# Patient Record
Sex: Female | Born: 1970 | Race: Black or African American | Hispanic: No | Marital: Single | Smoking: Never smoker
Health system: Southern US, Community
[De-identification: ages and names within clinical notes are randomized; demographics above are authoritative.]

## PROBLEM LIST (undated history)

## (undated) ENCOUNTER — Telehealth: Attending: Physician Assistant | Primary: Physician Assistant

## (undated) ENCOUNTER — Ambulatory Visit

## (undated) ENCOUNTER — Encounter: Attending: Physician Assistant | Primary: Physician Assistant

## (undated) ENCOUNTER — Ambulatory Visit: Payer: MEDICARE | Attending: Neurology | Primary: Neurology

## (undated) ENCOUNTER — Encounter

## (undated) ENCOUNTER — Encounter: Attending: Neurology | Primary: Neurology

## (undated) ENCOUNTER — Telehealth

## (undated) ENCOUNTER — Ambulatory Visit: Attending: Pharmacist | Primary: Pharmacist

## (undated) ENCOUNTER — Institutional Professional Consult (permissible substitution): Payer: MEDICARE | Attending: Physician Assistant | Primary: Physician Assistant

## (undated) ENCOUNTER — Ambulatory Visit
Payer: MEDICARE | Attending: Rehabilitative and Restorative Service Providers" | Primary: Rehabilitative and Restorative Service Providers"

## (undated) ENCOUNTER — Encounter: Attending: Pharmacist | Primary: Pharmacist

## (undated) ENCOUNTER — Ambulatory Visit: Payer: MEDICARE

## (undated) ENCOUNTER — Ambulatory Visit
Attending: Pharmacist Clinician (PhC)/ Clinical Pharmacy Specialist | Primary: Pharmacist Clinician (PhC)/ Clinical Pharmacy Specialist

## (undated) ENCOUNTER — Ambulatory Visit: Payer: MEDICARE | Attending: Physician Assistant | Primary: Physician Assistant

## (undated) ENCOUNTER — Ambulatory Visit: Payer: BLUE CROSS/BLUE SHIELD | Attending: Physician Assistant | Primary: Physician Assistant

## (undated) ENCOUNTER — Ambulatory Visit
Payer: BLUE CROSS/BLUE SHIELD | Attending: Pharmacist Clinician (PhC)/ Clinical Pharmacy Specialist | Primary: Pharmacist Clinician (PhC)/ Clinical Pharmacy Specialist

## (undated) DIAGNOSIS — K5900 Constipation, unspecified: Secondary | ICD-10-CM

## (undated) DIAGNOSIS — K5730 Diverticulosis of large intestine without perforation or abscess without bleeding: Secondary | ICD-10-CM

## (undated) DIAGNOSIS — G35 Multiple sclerosis: Secondary | ICD-10-CM

## (undated) DIAGNOSIS — G43909 Migraine, unspecified, not intractable, without status migrainosus: Secondary | ICD-10-CM

## (undated) DIAGNOSIS — M62838 Other muscle spasm: Secondary | ICD-10-CM

## (undated) DIAGNOSIS — T7840XA Allergy, unspecified, initial encounter: Secondary | ICD-10-CM

## (undated) DIAGNOSIS — Z8489 Family history of other specified conditions: Secondary | ICD-10-CM

## (undated) DIAGNOSIS — G4700 Insomnia, unspecified: Secondary | ICD-10-CM

## (undated) DIAGNOSIS — I10 Essential (primary) hypertension: Secondary | ICD-10-CM

## (undated) HISTORY — DX: Migraine, unspecified, not intractable, without status migrainosus: G43.909

## (undated) HISTORY — DX: Insomnia, unspecified: G47.00

## (undated) HISTORY — DX: Other muscle spasm: M62.838

## (undated) HISTORY — DX: Constipation, unspecified: K59.00

## (undated) HISTORY — DX: Allergy, unspecified, initial encounter: T78.40XA

## (undated) HISTORY — PX: GANGLION CYST EXCISION: SHX1691

## (undated) HISTORY — PX: BUNIONECTOMY: SHX129

## (undated) MED ORDER — CHOLECALCIFEROL (VITAMIN D3) 100 MCG (4,000 UNIT) CAPSULE: Freq: Every day | ORAL | 0.00000 days

## (undated) MED ORDER — MULTIVITAMIN-CA-IRON-MINERALS TABLET: ORAL | 0 days

---
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
## 2006-06-08 ENCOUNTER — Ambulatory Visit: Payer: Self-pay | Admitting: Family Medicine

## 2011-01-07 ENCOUNTER — Emergency Department: Payer: Self-pay | Admitting: Internal Medicine

## 2011-01-09 ENCOUNTER — Ambulatory Visit: Payer: Self-pay | Admitting: Family Medicine

## 2012-06-21 DIAGNOSIS — Z8679 Personal history of other diseases of the circulatory system: Secondary | ICD-10-CM | POA: Insufficient documentation

## 2013-04-17 DIAGNOSIS — G35 Multiple sclerosis: Secondary | ICD-10-CM | POA: Insufficient documentation

## 2013-05-28 ENCOUNTER — Encounter: Payer: Self-pay | Admitting: Physician Assistant

## 2013-08-06 DIAGNOSIS — E559 Vitamin D deficiency, unspecified: Secondary | ICD-10-CM | POA: Insufficient documentation

## 2013-10-01 ENCOUNTER — Ambulatory Visit: Payer: Self-pay | Admitting: Gastroenterology

## 2013-10-26 ENCOUNTER — Emergency Department: Payer: Self-pay | Admitting: Emergency Medicine

## 2013-10-27 ENCOUNTER — Emergency Department: Payer: Self-pay | Admitting: Internal Medicine

## 2014-10-01 ENCOUNTER — Emergency Department: Payer: Self-pay | Admitting: Internal Medicine

## 2014-10-01 DIAGNOSIS — S199XXA Unspecified injury of neck, initial encounter: Secondary | ICD-10-CM | POA: Diagnosis not present

## 2014-10-01 DIAGNOSIS — S4992XA Unspecified injury of left shoulder and upper arm, initial encounter: Secondary | ICD-10-CM | POA: Diagnosis not present

## 2014-10-01 DIAGNOSIS — S40012A Contusion of left shoulder, initial encounter: Secondary | ICD-10-CM | POA: Diagnosis not present

## 2014-10-01 DIAGNOSIS — M25512 Pain in left shoulder: Secondary | ICD-10-CM | POA: Diagnosis not present

## 2014-10-01 DIAGNOSIS — M549 Dorsalgia, unspecified: Secondary | ICD-10-CM | POA: Diagnosis not present

## 2014-10-01 DIAGNOSIS — S161XXA Strain of muscle, fascia and tendon at neck level, initial encounter: Secondary | ICD-10-CM | POA: Diagnosis not present

## 2014-10-01 DIAGNOSIS — M542 Cervicalgia: Secondary | ICD-10-CM | POA: Diagnosis not present

## 2014-10-01 DIAGNOSIS — T1490 Injury, unspecified: Secondary | ICD-10-CM | POA: Diagnosis not present

## 2014-10-14 DIAGNOSIS — E041 Nontoxic single thyroid nodule: Secondary | ICD-10-CM | POA: Diagnosis not present

## 2014-10-14 DIAGNOSIS — D440 Neoplasm of uncertain behavior of thyroid gland: Secondary | ICD-10-CM | POA: Diagnosis not present

## 2014-11-04 DIAGNOSIS — K219 Gastro-esophageal reflux disease without esophagitis: Secondary | ICD-10-CM | POA: Diagnosis not present

## 2014-11-04 DIAGNOSIS — G35 Multiple sclerosis: Secondary | ICD-10-CM | POA: Diagnosis not present

## 2014-11-04 DIAGNOSIS — E785 Hyperlipidemia, unspecified: Secondary | ICD-10-CM | POA: Diagnosis not present

## 2014-11-04 DIAGNOSIS — F325 Major depressive disorder, single episode, in full remission: Secondary | ICD-10-CM | POA: Diagnosis not present

## 2014-11-04 DIAGNOSIS — G43009 Migraine without aura, not intractable, without status migrainosus: Secondary | ICD-10-CM | POA: Diagnosis not present

## 2014-11-04 DIAGNOSIS — E041 Nontoxic single thyroid nodule: Secondary | ICD-10-CM | POA: Diagnosis not present

## 2014-11-04 DIAGNOSIS — Z87828 Personal history of other (healed) physical injury and trauma: Secondary | ICD-10-CM | POA: Diagnosis not present

## 2014-11-04 DIAGNOSIS — F5104 Psychophysiologic insomnia: Secondary | ICD-10-CM | POA: Diagnosis not present

## 2014-11-06 DIAGNOSIS — E041 Nontoxic single thyroid nodule: Secondary | ICD-10-CM | POA: Diagnosis not present

## 2014-11-06 DIAGNOSIS — E042 Nontoxic multinodular goiter: Secondary | ICD-10-CM | POA: Diagnosis not present

## 2015-01-27 DIAGNOSIS — K6289 Other specified diseases of anus and rectum: Secondary | ICD-10-CM | POA: Diagnosis not present

## 2015-03-03 DIAGNOSIS — E559 Vitamin D deficiency, unspecified: Secondary | ICD-10-CM | POA: Diagnosis not present

## 2015-03-03 DIAGNOSIS — G35 Multiple sclerosis: Secondary | ICD-10-CM | POA: Diagnosis not present

## 2015-03-29 DIAGNOSIS — R937 Abnormal findings on diagnostic imaging of other parts of musculoskeletal system: Secondary | ICD-10-CM | POA: Diagnosis not present

## 2015-03-29 DIAGNOSIS — R930 Abnormal findings on diagnostic imaging of skull and head, not elsewhere classified: Secondary | ICD-10-CM | POA: Diagnosis not present

## 2015-03-29 DIAGNOSIS — M2578 Osteophyte, vertebrae: Secondary | ICD-10-CM | POA: Diagnosis not present

## 2015-03-29 DIAGNOSIS — G9389 Other specified disorders of brain: Secondary | ICD-10-CM | POA: Diagnosis not present

## 2015-03-29 DIAGNOSIS — M898X8 Other specified disorders of bone, other site: Secondary | ICD-10-CM | POA: Diagnosis not present

## 2015-03-29 DIAGNOSIS — G35 Multiple sclerosis: Secondary | ICD-10-CM | POA: Diagnosis not present

## 2015-04-02 DIAGNOSIS — M25572 Pain in left ankle and joints of left foot: Secondary | ICD-10-CM | POA: Diagnosis not present

## 2015-04-02 DIAGNOSIS — K5900 Constipation, unspecified: Secondary | ICD-10-CM | POA: Diagnosis not present

## 2015-04-02 DIAGNOSIS — D72819 Decreased white blood cell count, unspecified: Secondary | ICD-10-CM | POA: Diagnosis not present

## 2015-04-02 DIAGNOSIS — F5104 Psychophysiologic insomnia: Secondary | ICD-10-CM | POA: Diagnosis not present

## 2015-04-02 DIAGNOSIS — G35 Multiple sclerosis: Secondary | ICD-10-CM | POA: Diagnosis not present

## 2015-04-02 DIAGNOSIS — J302 Other seasonal allergic rhinitis: Secondary | ICD-10-CM | POA: Diagnosis not present

## 2015-04-02 DIAGNOSIS — Z79899 Other long term (current) drug therapy: Secondary | ICD-10-CM | POA: Diagnosis not present

## 2015-04-02 DIAGNOSIS — G43009 Migraine without aura, not intractable, without status migrainosus: Secondary | ICD-10-CM | POA: Diagnosis not present

## 2015-04-02 DIAGNOSIS — K6289 Other specified diseases of anus and rectum: Secondary | ICD-10-CM | POA: Diagnosis not present

## 2015-04-02 DIAGNOSIS — E559 Vitamin D deficiency, unspecified: Secondary | ICD-10-CM | POA: Diagnosis not present

## 2015-04-02 DIAGNOSIS — F325 Major depressive disorder, single episode, in full remission: Secondary | ICD-10-CM | POA: Diagnosis not present

## 2015-04-02 DIAGNOSIS — E785 Hyperlipidemia, unspecified: Secondary | ICD-10-CM | POA: Diagnosis not present

## 2015-05-20 ENCOUNTER — Telehealth: Payer: Self-pay | Admitting: Family Medicine

## 2015-05-20 ENCOUNTER — Other Ambulatory Visit: Payer: Self-pay

## 2015-05-20 NOTE — Telephone Encounter (Signed)
Last seen 04-02-15 states that miralax does not really work when she have her flare-ups and she is completely out of lizness. If possible please send lizness prescription to [facility]

## 2015-05-21 MED ORDER — LINACLOTIDE 145 MCG PO CAPS: 145.0000 ug | ORAL_CAPSULE | Freq: Every day | ORAL | Status: DC

## 2015-05-21 NOTE — Telephone Encounter (Signed)
Left message to inform her that the prescription has been called in and that she need to schedule an appointment.

## 2015-05-21 NOTE — Telephone Encounter (Signed)
Please call patient so she can schedule a follow up appointment . Sending a refill of Linzess today.  Thank you

## 2015-05-22 NOTE — Telephone Encounter (Signed)
Patient states she needs her Lincess to be sent to [facility].

## 2015-05-23 NOTE — Telephone Encounter (Signed)
[facility], so I called pt to verify that the pharmacy was correct. She said that is her pharmacy she uses. I called in the Rx for [facility].

## 2015-08-26 ENCOUNTER — Encounter: Payer: Self-pay | Admitting: Emergency Medicine

## 2015-08-26 ENCOUNTER — Ambulatory Visit
Admission: EM | Admit: 2015-08-26 | Discharge: 2015-08-26 | Disposition: A | Discharge status: Home / Self Care | Payer: Medicare Other | Attending: Family Medicine | Admitting: Family Medicine

## 2015-08-26 DIAGNOSIS — H9201 Otalgia, right ear: Secondary | ICD-10-CM

## 2015-08-26 DIAGNOSIS — J0100 Acute maxillary sinusitis, unspecified: Secondary | ICD-10-CM

## 2015-08-26 MED ORDER — AZITHROMYCIN 250 MG PO TABS: ORAL_TABLET | ORAL | Status: DC

## 2015-08-26 NOTE — ED Provider Notes (Signed)
[facility] Emergency Department Provider Note  ____________________________________________  Time seen: Approximately 7:26 PM  I have reviewed the triage vital signs and the nursing notes.   HISTORY  Chief Complaint Otalgia    HPI LAIKYN GEWIRTZ is a 44 y.o. female presents for complaints of right ear pain and sinus congestion. States she has had sinus congestion and sinus pressure x 2-3 weeks, unrelieved by OTC mucinex and pseudoephedrine. States last 2 days with right ear discomfort, states feels like pressure. Denies head injury or LOC. States frequently thick greenish nasal drainage.   Denies headache, chest pain, dizziness, weakness, shortness of breath, fever, vomiting, nausea, neck pain or other complaints. Reports continues to eat and drink well. States unrelieved by OTC medications.    History reviewed. No pertinent past medical history. Multiple Sclerosis Vertigo Hypertension Vitamin D deficiency   There are no active problems to display for this patient.   History reviewed. No pertinent past surgical history.  Current Outpatient Rx  Name  Route  Sig  Dispense  Refill  .           Marland Kitchen Linaclotide (LINZESS) 145 MCG CAPS capsule   Oral   Take 1 capsule (145 mcg total) by mouth daily.   30 capsule   1   copazone injections Vitamin D gabapentin  Allergies Penicillins  History reviewed. No pertinent family history.  Social History Social History  Substance Use Topics  . Smoking status: Never Smoker   . Smokeless tobacco: None  . Alcohol Use: No    Review of Systems Constitutional: No fever/chills Eyes: No visual changes. ENT: No sore throat. Positive for sinus congestion, nasal drainage, and right ear discomfort.  Cardiovascular: Denies chest pain. Respiratory: Denies shortness of breath. Gastrointestinal: No abdominal pain.  No nausea, no vomiting.  No diarrhea.  No constipation. Genitourinary: Negative for  dysuria. Musculoskeletal: Negative for back pain. Skin: Negative for rash. Neurological: Negative for headaches, focal weakness or numbness.  10-point ROS otherwise negative.  ____________________________________________   PHYSICAL EXAM:  VITAL SIGNS: ED Triage Vitals  Enc Vitals Group     BP 08/26/15 1856 139/91 mmHg     Pulse Rate 08/26/15 1856 81     Resp 08/26/15 1856 18     Temp 08/26/15 1856 98.8 F (37.1 C)     Temp Source 08/26/15 1856 Tympanic     SpO2 08/26/15 1856 100 %     Weight 08/26/15 1856 151 lb (68.493 kg)     Height 08/26/15 1856 5\' 3"  (1.6 m)     Head Cir --      Peak Flow --      Pain Score 08/26/15 1858 7     Pain Loc --      Pain Edu? --      Excl. in [place]? --     Constitutional: Alert and oriented. Well appearing and in no acute distress. Eyes: Conjunctivae are normal. PERRL. EOMI. Head: Atraumatic.mod TTP bilateral maxillary sinuses, frontal sinuses nonTTP. No erythema or swelling.   Ears: no erythema, normal TMs bilaterally.   Nose: purulent nasal drainage, bilateral nasal turbinate edema and bogginess, nares patent.   Mouth/Throat: Mucous membranes are moist.  Oropharynx non-erythematous. Neck: No stridor.  No cervical spine tenderness to palpation. Hematological/Lymphatic/Immunilogical: No cervical lymphadenopathy. Cardiovascular: Normal rate, regular rhythm. Grossly normal heart sounds.  Good peripheral circulation. Respiratory: Normal respiratory effort.  No retractions. Lungs CTAB. Gastrointestinal: Soft and nontender. No distention. Normal Bowel sounds. No CVA tenderness. Musculoskeletal:  No lower or upper extremity tenderness nor edema.  No joint effusions. Bilateral pedal pulses equal and easily palpated. No cervical, thoracic or lumbar TTP.  Neurologic:  Normal speech and language. No gross focal neurologic deficits are appreciated. No gait instability. Skin:  Skin is warm, dry and intact. No rash noted. Psychiatric: Mood and affect are  normal. Speech and behavior are normal.  ____________________________________________   LABS (all labs ordered are listed, but only abnormal results are displayed)  Labs Reviewed - No data to display ______________________________________   INITIAL IMPRESSION / ASSESSMENT AND PLAN / ED COURSE  Pertinent labs & imaging results that were available during my care of the patient were reviewed by me and considered in my medical decision making (see chart for details).  Well-appearing patient. No acute distress.Presents for sinus drainage and congestion x 2-3 weeks and with right ear discomfort x 2 days. Denies fever, chest pain, shortness of breath, dizziness, or other complaints. Continues to eat and drink well per patient. 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 Will treat with oral azithromycin and supportive treatments including rest, fluids, otc tylenol or ibuprofen as needed. Discussed follow up with Primary care physician this week. Discussed follow up and return parameters including no resolution or any worsening concerns. Patient verbalized understanding and agreed to plan.   ____________________________________________   FINAL CLINICAL IMPRESSION(S) / ED DIAGNOSES  Final diagnoses:  Acute maxillary sinusitis, recurrence not specified  Otalgia, right       Marylene Land, NP 08/26/15 2000  Marylene Land, NP 08/26/15 2007  Marylene Land, NP 08/26/15 2008

## 2015-08-26 NOTE — ED Notes (Signed)
Pt with right ear pain x 2 days

## 2015-08-26 NOTE — Discharge Instructions (Signed)
2 medication as prescribed. Rest. Drink plenty of water. Take over-the-counter Tylenol or ibuprofen as needed for pain.  Follow-up with her primary care physician. Return to urgent care as needed for new or worsening concerns.  Sinusitis Sinusitis is redness, soreness, and inflammation of the paranasal sinuses. Paranasal sinuses are air pockets within the bones of your face (beneath the eyes, the middle of the forehead, or above the eyes). In healthy paranasal sinuses, mucus is able to drain out, and air is able to circulate through them by way of your nose. However, when your paranasal sinuses are inflamed, mucus and air can become trapped. This can allow bacteria and other germs to grow and cause infection. Sinusitis can develop quickly and last only a short time (acute) or continue over a long period (chronic). Sinusitis that lasts for more than 12 weeks is considered chronic.  CAUSES  Causes of sinusitis include:  Allergies.  Structural abnormalities, such as displacement of the cartilage that separates your nostrils (deviated septum), which can decrease the air flow through your nose and sinuses and affect sinus drainage.  Functional abnormalities, such as when the small hairs (cilia) that line your sinuses and help remove mucus do not work properly or are not present. SIGNS AND SYMPTOMS  Symptoms of acute and chronic sinusitis are the same. The primary symptoms are pain and pressure around the affected sinuses. Other symptoms include:  Upper toothache.  Earache.  Headache.  Bad breath.  Decreased sense of smell and taste.  A cough, which worsens when you are lying flat.  Fatigue.  Fever.  Thick drainage from your nose, which often is green and may contain pus (purulent).  Swelling and warmth over the affected sinuses. DIAGNOSIS  Your health care provider will perform a physical exam. During the exam, your health care provider may:  Look in your nose for signs of abnormal  growths in your nostrils (nasal polyps).  Tap over the affected sinus to check for signs of infection.  View the inside of your sinuses (endoscopy) using an imaging device that has a light attached (endoscope). If your health care provider suspects that you have chronic sinusitis, one or more of the following tests may be recommended:  Allergy tests.  Nasal culture. A sample of mucus is taken from your nose, sent to a lab, and screened for bacteria.  Nasal cytology. A sample of mucus is taken from your nose and examined by your health care provider to determine if your sinusitis is related to an allergy. TREATMENT  Most cases of acute sinusitis are related to a viral infection and will resolve on their own within 10 days. Sometimes medicines are prescribed to help relieve symptoms (pain medicine, decongestants, nasal steroid sprays, or saline sprays).  However, for sinusitis related to a bacterial infection, your health care provider will prescribe antibiotic medicines. These are medicines that will help kill the bacteria causing the infection.  Rarely, sinusitis is caused by a fungal infection. In theses cases, your health care provider will prescribe antifungal medicine. For some cases of chronic sinusitis, surgery is needed. Generally, these are cases in which sinusitis recurs more than 3 times per year, despite other treatments. HOME CARE INSTRUCTIONS   Drink plenty of water. Water helps thin the mucus so your sinuses can drain more easily.  Use a humidifier.  Inhale steam 3 to 4 times a day (for example, sit in the bathroom with the shower running).  Apply a warm, moist washcloth to your face 3  to 4 times a day, or as directed by your health care provider.  Use saline nasal sprays to help moisten and clean your sinuses.  Take medicines only as directed by your health care provider.  If you were prescribed either an antibiotic or antifungal medicine, finish it all even if you start  to feel better. SEEK IMMEDIATE MEDICAL CARE IF:  You have increasing pain or severe headaches.  You have nausea, vomiting, or drowsiness.  You have swelling around your face.  You have vision problems.  You have a stiff neck.  You have difficulty breathing. MAKE SURE YOU:   Understand these instructions.  Will watch your condition.  Will get help right away if you are not doing well or get worse. Document Released: 11/15/2005 Document Revised: 04/01/2014 Document Reviewed: 11/30/2011 [facility] Patient Information 2015 [place], [place]. This information is not intended to replace advice given to you by your health care provider. Make sure you discuss any questions you have with your health care provider.  Otalgia Otalgia is pain in or around the ear. When the pain is from the ear itself it is called primary otalgia. Pain may also be coming from somewhere else, like the head and neck. This is called secondary otalgia.  CAUSES  Causes of primary otalgia include:  Middle ear infection.  It can also be caused by injury to the ear or infection of the ear canal (swimmer's ear). Swimmer's ear causes pain, swelling and often drainage from the ear canal. Causes of secondary otalgia include:  Sinus infections.  Allergies and colds that cause stuffiness of the nose and tubes that drain the ears (eustachian tubes).  Dental problems like cavities, gum infections or teething.  Sore Throat (tonsillitis and pharyngitis).  Swollen glands in the neck.  Infection of the bone behind the ear (mastoiditis).  TMJ discomfort (problems with the joint between your jaw and your skull).  Other problems such as nerve disorders, circulation problems, heart disease and tumors of the head and neck can also cause symptoms of ear pain. This is rare. DIAGNOSIS  Evaluation, Diagnosis and Testing:  Examination by your medical caregiver is recommended to evaluate and diagnose the cause of  otalgia.  Further testing or referral to a specialist may be indicated if the cause of the ear pain is not found and the symptom persists. TREATMENT   Your doctor may prescribe antibiotics if an ear infection is diagnosed.  Pain relievers and topical analgesics may be recommended.  It is important to take all medications as prescribed. HOME CARE INSTRUCTIONS   It may be helpful to sleep with the painful ear in the up position.  A warm compress over the painful ear may provide relief.  A soft diet and avoiding gum may help while ear pain is present. SEEK IMMEDIATE MEDICAL CARE IF:  You develop severe pain, a high fever, repeated vomiting or dehydration.  You develop extreme dizziness, headache, confusion, ringing in the ears (tinnitus) or hearing loss. Document Released: 12/23/2004 Document Revised: 02/07/2012 Document Reviewed: 09/24/2009 [facility] Patient Information 2015 [place], [place]. This information is not intended to replace advice given to you by your health care provider. Make sure you discuss any questions you have with your health care provider.

## 2015-12-31 DIAGNOSIS — Z5321 Procedure and treatment not carried out due to patient leaving prior to being seen by health care provider: Secondary | ICD-10-CM | POA: Diagnosis not present

## 2015-12-31 DIAGNOSIS — E559 Vitamin D deficiency, unspecified: Secondary | ICD-10-CM | POA: Diagnosis not present

## 2015-12-31 DIAGNOSIS — R0781 Pleurodynia: Secondary | ICD-10-CM | POA: Diagnosis not present

## 2015-12-31 DIAGNOSIS — G35 Multiple sclerosis: Secondary | ICD-10-CM | POA: Diagnosis not present

## 2016-01-02 ENCOUNTER — Telehealth: Payer: Self-pay | Admitting: Family Medicine

## 2016-01-02 NOTE — Telephone Encounter (Signed)
Pt needs refill on Linzess to be sent to [facility]. Pt is completely out and has an appt on 01/07/16. Pt would like to know if enough can be called into last until her appt. Please advise.

## 2016-01-03 MED ORDER — LINACLOTIDE 145 MCG PO CAPS
145.0000 ug | ORAL_CAPSULE | Freq: Every day | ORAL | Status: DC
Start: 2016-01-03 — End: 2016-12-09

## 2016-01-03 NOTE — Telephone Encounter (Signed)
sent 

## 2016-01-07 ENCOUNTER — Ambulatory Visit: Payer: Self-pay | Admitting: Family Medicine

## 2016-01-13 ENCOUNTER — Ambulatory Visit
Admission: EM | Admit: 2016-01-13 | Discharge: 2016-01-13 | Disposition: A | Discharge status: Home / Self Care | Payer: Medicare Other | Attending: Family Medicine | Admitting: Family Medicine

## 2016-01-13 ENCOUNTER — Encounter: Payer: Self-pay | Admitting: *Deleted

## 2016-01-13 DIAGNOSIS — Z880 Allergy status to penicillin: Secondary | ICD-10-CM | POA: Diagnosis not present

## 2016-01-13 DIAGNOSIS — J101 Influenza due to other identified influenza virus with other respiratory manifestations: Secondary | ICD-10-CM

## 2016-01-13 DIAGNOSIS — R0981 Nasal congestion: Secondary | ICD-10-CM | POA: Diagnosis present

## 2016-01-13 DIAGNOSIS — J09X2 Influenza due to identified novel influenza A virus with other respiratory manifestations: Secondary | ICD-10-CM | POA: Diagnosis not present

## 2016-01-13 DIAGNOSIS — J029 Acute pharyngitis, unspecified: Secondary | ICD-10-CM | POA: Diagnosis present

## 2016-01-13 LAB — RAPID INFLUENZA A&B ANTIGENS
Influenza A (ARMC): DETECTED
Influenza B (ARMC): NOT DETECTED

## 2016-01-13 MED ORDER — OSELTAMIVIR PHOSPHATE 75 MG PO CAPS
75.0000 mg | ORAL_CAPSULE | Freq: Two times a day (BID) | ORAL | Status: DC
Start: 2016-01-13 — End: 2016-12-09

## 2016-01-13 MED ORDER — GUAIFENESIN-CODEINE 100-10 MG/5ML PO SOLN: 10.0000 mL | Freq: Four times a day (QID) | ORAL | Status: DC | PRN

## 2016-01-13 MED ORDER — IBUPROFEN 800 MG PO TABS
800.0000 mg | ORAL_TABLET | Freq: Once | ORAL | Status: AC
  Administered 2016-01-13: 800 mg via ORAL

## 2016-01-13 NOTE — ED Provider Notes (Signed)
CSN: [id number]     Arrival date & time 01/13/16  1146 History   First MD Initiated Contact with Patient 01/13/16 1349     Chief Complaint  Patient presents with  . Cough  . Fever  . Sore Throat  . Nasal Congestion   (Consider location/radiation/quality/duration/timing/severity/associated sxs/prior Treatment) Patient is a 45 y.o. female presenting with URI.  URI Presenting symptoms: congestion, cough, fatigue, fever, rhinorrhea and sore throat   Severity:  Moderate Onset quality:  Sudden Duration:  2 days Timing:  Constant Progression:  Worsening Chronicity:  New Relieved by:  Nothing Ineffective treatments:  OTC medications Associated symptoms: arthralgias, myalgias and swollen glands   Associated symptoms: no headaches, no neck pain, no sinus pain and no wheezing   Risk factors: sick contacts (family members with flu)   Risk factors: not elderly, no chronic cardiac disease, no chronic respiratory disease, no diabetes mellitus and no immunosuppression     History reviewed. No pertinent past medical history. Past Surgical History  Procedure Laterality Date  . Cesarean section N/A    History reviewed. No pertinent family history. Social History  Substance Use Topics  . Smoking status: Never Smoker   . Smokeless tobacco: None  . Alcohol Use: No   OB History    No data available     Review of Systems  Constitutional: Positive for fever and fatigue.  HENT: Positive for congestion, rhinorrhea and sore throat.   Respiratory: Positive for cough. Negative for wheezing.   Musculoskeletal: Positive for myalgias and arthralgias. Negative for neck pain.  Neurological: Negative for headaches.    Allergies  Penicillins  Home Medications   Prior to Admission medications   Medication Sig Start Date End Date Taking? Authorizing Provider  azithromycin (ZITHROMAX Z-PAK) 250 MG tablet Take 2 tablets (500 mg) on  Day 1,  followed by 1 tablet (250 mg) once daily on Days 2 through  5. 08/26/15   Marylene Land, NP  guaiFENesin-codeine 100-10 MG/5ML syrup Take 10 mLs by mouth every 6 (six) hours as needed for cough. 01/13/16   Norval Gable, MD  Linaclotide [facility]) 145 MCG CAPS capsule Take 1 capsule (145 mcg total) by mouth daily. 01/03/16   Steele Sizer, MD  oseltamivir (TAMIFLU) 75 MG capsule Take 1 capsule (75 mg total) by mouth 2 (two) times daily. 01/13/16   Norval Gable, MD   Meds Ordered and Administered this Visit   Medications  ibuprofen (ADVIL,MOTRIN) tablet 800 mg (800 mg Oral Given 01/13/16 1320)    BP 144/94 mmHg  Pulse 92  Temp(Src) 99.6 F (37.6 C) (Oral)  Resp 20  Ht 5\' 2"  (1.575 m)  Wt 147 lb (66.679 kg)  BMI 26.88 kg/m2  SpO2 99%  LMP 12/17/2015 (Approximate) No data found.   Physical Exam  Constitutional: She appears well-developed and well-nourished. No distress.  HENT:  Head: Normocephalic and atraumatic.  Right Ear: Tympanic membrane, external ear and ear canal normal.  Left Ear: Tympanic membrane, external ear and ear canal normal.  Nose: Rhinorrhea present. No nose lacerations, sinus tenderness, nasal deformity, septal deviation or nasal septal hematoma. No epistaxis.  No foreign bodies.  Mouth/Throat: Uvula is midline and mucous membranes are normal. Posterior oropharyngeal erythema present. No oropharyngeal exudate or tonsillar abscesses.  Eyes: Conjunctivae and EOM are normal. Pupils are equal, round, and reactive to light. Right eye exhibits no discharge. Left eye exhibits no discharge. No scleral icterus.  Neck: Normal range of motion. Neck supple. No thyromegaly present.  Cardiovascular: Normal  rate, regular rhythm and normal heart sounds.   Pulmonary/Chest: Effort normal and breath sounds normal. No respiratory distress. She has no wheezes. She has no rales.  Lymphadenopathy:    She has no cervical adenopathy.  Skin: Rash noted. She is not diaphoretic.  Nursing note and vitals reviewed.   ED Course  Procedures (including  critical care time)  Labs Review Labs Reviewed  RAPID INFLUENZA A&B ANTIGENS ([place])    Imaging Review No results found.   Visual Acuity Review  Right Eye Distance:   Left Eye Distance:   Bilateral Distance:    Right Eye Near:   Left Eye Near:    Bilateral Near:         MDM   1. Influenza A    Discharge Medication List as of 01/13/2016  2:05 PM    START taking these medications   Details  guaiFENesin-codeine 100-10 MG/5ML syrup Take 10 mLs by mouth every 6 (six) hours as needed for cough., Starting 01/13/2016, Until Discontinued, Print    oseltamivir (TAMIFLU) 75 MG capsule Take 1 capsule (75 mg total) by mouth 2 (two) times daily., Starting 01/13/2016, Until Discontinued, Normal       1. Lab results and diagnosis reviewed with patient 2. rx as per orders above; reviewed possible side effects, interactions, risks and benefits  3. Recommend supportive treatment with otc analgesics, increased fluids 4. Follow-up prn if symptoms worsen or don't improve    Norval Gable, MD 01/13/16 1435

## 2016-01-13 NOTE — ED Notes (Signed)
Also c/o headache 

## 2016-01-13 NOTE — ED Notes (Signed)
Non-productive cough, fever, sore throat, and nasal congestion x2 days. Several family members dx with flu recently.

## 2016-05-25 ENCOUNTER — Encounter: Payer: Self-pay | Admitting: *Deleted

## 2016-05-25 ENCOUNTER — Ambulatory Visit
Admission: EM | Admit: 2016-05-25 | Discharge: 2016-05-25 | Disposition: A | Discharge status: Home / Self Care | Payer: Medicare Other | Attending: Family Medicine | Admitting: Family Medicine

## 2016-05-25 DIAGNOSIS — J0100 Acute maxillary sinusitis, unspecified: Secondary | ICD-10-CM | POA: Diagnosis not present

## 2016-05-25 HISTORY — DX: Diverticulosis of large intestine without perforation or abscess without bleeding: K57.30

## 2016-05-25 HISTORY — DX: Multiple sclerosis: G35

## 2016-05-25 MED ORDER — FLUTICASONE PROPIONATE 50 MCG/ACT NA SUSP: 2.0000 | Freq: Every day | NASAL | Status: DC

## 2016-05-25 MED ORDER — FEXOFENADINE-PSEUDOEPHED ER 180-240 MG PO TB24: 1.0000 | ORAL_TABLET | Freq: Every day | ORAL | Status: DC

## 2016-05-25 MED ORDER — CEFUROXIME AXETIL 500 MG PO TABS: 500.0000 mg | ORAL_TABLET | Freq: Two times a day (BID) | ORAL | Status: DC

## 2016-05-25 NOTE — ED Provider Notes (Signed)
CSN: [id number]     Arrival date & time 05/25/16  [id number] History   First MD Initiated Contact with Patient 05/25/16 [phone]    Nurses notes were reviewed. Chief Complaint  Patient presents with  . Sinus Problem    Patient reports sinus pain states that URI like symptoms about 6 days ago nasal congestion and coughing. Things were at least shifted to sinus pressure and nasal drainage becoming yellow and ask the same blood coming from. She reports unable to breathe through her nostril coughing and just feeling miserable. No fever. She does have a history of MS C-section and ganglion surgery before. She does not smoke and mother has diabetes and hypertension.    (Consider location/radiation/quality/duration/timing/severity/associated sxs/prior Treatment) Patient is a 45 y.o. female presenting with sinus complaint. The history is provided by the patient. No language interpreter was used.  Sinus Problem This is a new problem. The current episode started more than 2 days ago. The problem occurs constantly. The problem has been gradually worsening. Pertinent negatives include no chest pain, no abdominal pain, no headaches and no shortness of breath. Nothing aggravates the symptoms. Nothing relieves the symptoms. Treatments tried: decongestants.    Past Medical History  Diagnosis Date  . MS (multiple sclerosis) ([place])   . Diverticula of colon    Past Surgical History  Procedure Laterality Date  . Cesarean section N/A    Family History  Problem Relation Age of Onset  . Diabetes Mother   . Hypertension Mother   . Cancer Father    Social History  Substance Use Topics  . Smoking status: Never Smoker   . Smokeless tobacco: Never Used  . Alcohol Use: No   OB History    No data available     Review of Systems  HENT: Positive for nosebleeds, postnasal drip, rhinorrhea, sore throat and voice change.   Respiratory: Negative for shortness of breath.   Cardiovascular: Negative for chest pain.   Gastrointestinal: Negative for abdominal pain.  Neurological: Negative for headaches.  All other systems reviewed and are negative.   Allergies  Penicillins  Home Medications   Prior to Admission medications   Medication Sig Start Date End Date Taking? Authorizing Provider  Glatiramer Acetate (COPAXONE) 40 MG/ML SOSY Inject 40 mg into the skin 3 (three) times a week.   Yes Historical Provider, MD  azithromycin (ZITHROMAX Z-PAK) 250 MG tablet Take 2 tablets (500 mg) on  Day 1,  followed by 1 tablet (250 mg) once daily on Days 2 through 5. 08/26/15   Marylene Land, NP  cefUROXime (CEFTIN) 500 MG tablet Take 1 tablet (500 mg total) by mouth 2 (two) times daily. 05/25/16   Frederich Cha, MD  fexofenadine-pseudoephedrine (ALLEGRA-D ALLERGY & CONGESTION) 180-240 MG 24 hr tablet Take 1 tablet by mouth daily. 05/25/16   Frederich Cha, MD  fluticasone (FLONASE) 50 MCG/ACT nasal spray Place 2 sprays into both nostrils daily. 05/25/16   Frederich Cha, MD  guaiFENesin-codeine 100-10 MG/5ML syrup Take 10 mLs by mouth every 6 (six) hours as needed for cough. 01/13/16   Norval Gable, MD  Linaclotide [facility]) 145 MCG CAPS capsule Take 1 capsule (145 mcg total) by mouth daily. 01/03/16   Steele Sizer, MD  oseltamivir (TAMIFLU) 75 MG capsule Take 1 capsule (75 mg total) by mouth 2 (two) times daily. 01/13/16   Norval Gable, MD   Meds Ordered and Administered this Visit  Medications - No data to display  BP 152/99 mmHg  Pulse 81  Temp(Src)  98.2 F (36.8 C) (Oral)  Resp 18  Ht 5\' 2"  (1.575 m)  Wt 150 lb (68.04 kg)  BMI 27.43 kg/m2  SpO2 100%  LMP 05/20/2016 No data found.   Physical Exam  Constitutional: She is oriented to person, place, and time. She appears well-developed and well-nourished.  HENT:  Head: Normocephalic.  Right Ear: Hearing, tympanic membrane, external ear and ear canal normal.  Left Ear: Hearing, tympanic membrane, external ear and ear canal normal.  Nose: Mucosal edema and  rhinorrhea present. Right sinus exhibits maxillary sinus tenderness. Right sinus exhibits no frontal sinus tenderness. Left sinus exhibits no maxillary sinus tenderness and no frontal sinus tenderness.  Mouth/Throat: Uvula is midline. Posterior oropharyngeal erythema present.  Eyes: Pupils are equal, round, and reactive to light.  Neck: Normal range of motion. Neck supple.  Cardiovascular: Normal rate and regular rhythm.   Pulmonary/Chest: Breath sounds normal.  Musculoskeletal: Normal range of motion.  Lymphadenopathy:    She has cervical adenopathy.  Neurological: She is alert and oriented to person, place, and time.  Skin: Skin is warm.  Psychiatric: She has a normal mood and affect.  Vitals reviewed.   ED Course  Procedures (including critical care time)  Labs Review Labs Reviewed - No data to display  Imaging Review No results found.   Visual Acuity Review  Right Eye Distance:   Left Eye Distance:   Bilateral Distance:    Right Eye Near:   Left Eye Near:    Bilateral Near:         MDM   1. Acute maxillary sinusitis, recurrence not specified      We'll treat for sinusitis since been 5 days but because of the dramatic change and the epistaxis is occurring we'll go with Ceftin 500 mg twice a day she does have an allergy to penicillin but she is taken cephalosporins before in the past. Flonase nasal spray 2 puffs each nostril daily. And also placed on Allegra-D 1 tablet daily. Will give a work note for today and tomorrow follow-up with her PCP if not better in a week.      Frederich Cha, MD 05/25/16 1007

## 2016-05-25 NOTE — ED Notes (Signed)
Patient started having symptoms of sinus pressure and drainage 5 days ago. OTC medications have not helped to resolve symptoms. No previous history of recurrent sinus problems.

## 2016-05-25 NOTE — Discharge Instructions (Signed)
Sinusitis, Adult Sinusitis is redness, soreness, and puffiness (inflammation) of the air pockets in the bones of your face (sinuses). The redness, soreness, and puffiness can cause air and mucus to get trapped in your sinuses. This can allow germs to grow and cause an infection.  HOME CARE   Drink enough fluids to keep your pee (urine) clear or pale yellow.  Use a humidifier in your home.  Run a hot shower to create steam in the bathroom. Sit in the bathroom with the door closed. Breathe in the steam 3-4 times a day.  Put a warm, moist washcloth on your face 3-4 times a day, or as told by your doctor.  Use salt water sprays (saline sprays) to wet the thick fluid in your nose. This can help the sinuses drain.  Only take medicine as told by your doctor. GET HELP RIGHT AWAY IF:   Your pain gets worse.  You have very bad headaches.  You are sick to your stomach (nauseous).  You throw up (vomit).  You are very sleepy (drowsy) all the time.  Your face is puffy (swollen).  Your vision changes.  You have a stiff neck.  You have trouble breathing. MAKE SURE YOU:   Understand these instructions.  Will watch your condition.  Will get help right away if you are not doing well or get worse.   This information is not intended to replace advice given to you by your health care provider. Make sure you discuss any questions you have with your health care provider.   Document Released: 05/03/2008 Document Revised: 12/06/2014 Document Reviewed: 06/20/2012 Elsevier Interactive Patient Education 2016 [employer].  Sinusitis, Adult Sinusitis is redness, soreness, and puffiness (inflammation) of the air pockets in the bones of your face (sinuses). The redness, soreness, and puffiness can cause air and mucus to get trapped in your sinuses. This can allow germs to grow and cause an infection.  HOME CARE   Drink enough fluids to keep your pee (urine) clear or pale yellow.  Use a  humidifier in your home.  Run a hot shower to create steam in the bathroom. Sit in the bathroom with the door closed. Breathe in the steam 3-4 times a day.  Put a warm, moist washcloth on your face 3-4 times a day, or as told by your doctor.  Use salt water sprays (saline sprays) to wet the thick fluid in your nose. This can help the sinuses drain.  Only take medicine as told by your doctor. GET HELP RIGHT AWAY IF:   Your pain gets worse.  You have very bad headaches.  You are sick to your stomach (nauseous).  You throw up (vomit).  You are very sleepy (drowsy) all the time.  Your face is puffy (swollen).  Your vision changes.  You have a stiff neck.  You have trouble breathing. MAKE SURE YOU:   Understand these instructions.  Will watch your condition.  Will get help right away if you are not doing well or get worse.   This information is not intended to replace advice given to you by your health care provider. Make sure you discuss any questions you have with your health care provider.   Document Released: 05/03/2008 Document Revised: 12/06/2014 Document Reviewed: 06/20/2012 Elsevier Interactive Patient Education [employer].

## 2016-08-14 DIAGNOSIS — M4802 Spinal stenosis, cervical region: Secondary | ICD-10-CM | POA: Diagnosis not present

## 2016-08-14 DIAGNOSIS — R9089 Other abnormal findings on diagnostic imaging of central nervous system: Secondary | ICD-10-CM | POA: Diagnosis not present

## 2016-08-14 DIAGNOSIS — M5021 Other cervical disc displacement,  high cervical region: Secondary | ICD-10-CM | POA: Diagnosis not present

## 2016-08-14 DIAGNOSIS — G35 Multiple sclerosis: Secondary | ICD-10-CM | POA: Diagnosis not present

## 2016-08-27 DIAGNOSIS — G35 Multiple sclerosis: Secondary | ICD-10-CM | POA: Diagnosis not present

## 2016-12-09 ENCOUNTER — Ambulatory Visit (INDEPENDENT_AMBULATORY_CARE_PROVIDER_SITE_OTHER): Payer: Medicare Other | Admitting: Family Medicine

## 2016-12-09 ENCOUNTER — Encounter: Payer: Self-pay | Admitting: Family Medicine

## 2016-12-09 VITALS — BP 124/82 | HR 95 | Temp 98.3°F | Resp 16 | Ht 62.0 in | Wt 161.0 lb

## 2016-12-09 DIAGNOSIS — E663 Overweight: Secondary | ICD-10-CM | POA: Diagnosis not present

## 2016-12-09 DIAGNOSIS — E785 Hyperlipidemia, unspecified: Secondary | ICD-10-CM | POA: Insufficient documentation

## 2016-12-09 DIAGNOSIS — Z79899 Other long term (current) drug therapy: Secondary | ICD-10-CM | POA: Diagnosis not present

## 2016-12-09 DIAGNOSIS — M545 Low back pain, unspecified: Secondary | ICD-10-CM

## 2016-12-09 DIAGNOSIS — D708 Other neutropenia: Secondary | ICD-10-CM | POA: Diagnosis not present

## 2016-12-09 DIAGNOSIS — E559 Vitamin D deficiency, unspecified: Secondary | ICD-10-CM

## 2016-12-09 DIAGNOSIS — K5790 Diverticulosis of intestine, part unspecified, without perforation or abscess without bleeding: Secondary | ICD-10-CM | POA: Insufficient documentation

## 2016-12-09 DIAGNOSIS — K5909 Other constipation: Secondary | ICD-10-CM

## 2016-12-09 DIAGNOSIS — F325 Major depressive disorder, single episode, in full remission: Secondary | ICD-10-CM | POA: Insufficient documentation

## 2016-12-09 DIAGNOSIS — K6289 Other specified diseases of anus and rectum: Secondary | ICD-10-CM | POA: Insufficient documentation

## 2016-12-09 DIAGNOSIS — E669 Obesity, unspecified: Secondary | ICD-10-CM | POA: Insufficient documentation

## 2016-12-09 DIAGNOSIS — Z9109 Other allergy status, other than to drugs and biological substances: Secondary | ICD-10-CM | POA: Insufficient documentation

## 2016-12-09 DIAGNOSIS — G43009 Migraine without aura, not intractable, without status migrainosus: Secondary | ICD-10-CM

## 2016-12-09 DIAGNOSIS — J302 Other seasonal allergic rhinitis: Secondary | ICD-10-CM | POA: Diagnosis not present

## 2016-12-09 DIAGNOSIS — K219 Gastro-esophageal reflux disease without esophagitis: Secondary | ICD-10-CM | POA: Insufficient documentation

## 2016-12-09 MED ORDER — LINACLOTIDE 145 MCG PO CAPS: 145.0000 ug | ORAL_CAPSULE | Freq: Every day | ORAL | 5 refills | Status: DC

## 2016-12-09 MED ORDER — LORCASERIN HCL ER 20 MG PO TB24: 1.0000 | ORAL_TABLET | Freq: Every day | ORAL | 2 refills | Status: DC

## 2016-12-09 MED ORDER — SUMATRIPTAN SUCCINATE 100 MG PO TABS: 100.0000 mg | ORAL_TABLET | ORAL | 0 refills | Status: DC | PRN

## 2016-12-09 MED ORDER — FLUTICASONE PROPIONATE 50 MCG/ACT NA SUSP: 2.0000 | Freq: Every day | NASAL | 2 refills | Status: DC

## 2016-12-09 NOTE — Patient Instructions (Signed)

## 2016-12-09 NOTE — Progress Notes (Signed)
Name: Tammie Lopez   MRN: [id number]    DOB: 09/16/1971   Date:12/09/2016       Progress Note  Subjective  Chief Complaint  Chief Complaint  Patient presents with  . Medication Refill  . Pain    lower right side radiating into leg for about 3-4 months comes and goes  . 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 Left side used to go numb, but has been controlled with Copaxone, gabapentin and Elavil. She sees neurologist at [facility] - Dr. Varney Daily  Migraine Headaches: she has a long history of migraines, she states symptoms of migraine is down to once a week, she has been out of Imitrex, she would like a refill. No aura. Pain is described as frontal and behind her eyes, described as sharp, throbbing, associated with photophobia and phonophobia. Occasionally nausea, no vomiting  Overweight: upset about her weight gain, she has stopped drinking sodas, and has been walking for the past few months and states she has lost about 5 lbs on her own, but would like help to lose it faster. Explained risk of all the medications and based on guidelines she does not qualify, she states she will pay cash. We will give a rx of [place]vi[place] since it will not raise her bp and should not affect her central nervous system  Chronic Constipation: she has been out of Linzess and has bowel movements every 3-4 days, she has to strain at times, no blood in stools. Seen by GI in the past for proctitis and was given Canasa but is out of medication, advised to contact Dr Durwin Reges and keep her follow ups with him   Low back pain: she has intermittent right lower aching pain, that occasionally radiates to right lateral thigh, no tingling or numbness at this time.   Patient Active Problem List   Diagnosis Date Noted  . Migraine without aura, not intractable 12/09/2016  . Overweight (BMI 25.0-29.9) 12/09/2016  . Nickel allergy  12/09/2016  . GERD without esophagitis 12/09/2016  . Leukopenia 12/09/2016  . Dyslipidemia 12/09/2016  . Diverticulosis 12/09/2016  . 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 Major depression in remission ([place]) 12/09/2016  . Allergic rhinitis, seasonal 12/09/2016  . Chronic constipation 12/09/2016  . Vitamin D deficiency disease 08/06/2013  . MS (multiple sclerosis) ([place]) 04/17/2013  . History of hypertension 06/21/2012    Past Surgical History:  Procedure Laterality Date  . CESAREAN SECTION N/A     Family History  Problem Relation Age of Onset  . Diabetes Mother   . Hypertension Mother   . Cancer Father     Social History   Social History  . Marital status: Single    Spouse name: N/A  . Number of children: N/A  . Years of education: N/A   Occupational History  . Not on file.   Social History Main Topics  . Smoking status: Never Smoker  . Smokeless tobacco: Never Used  . Alcohol use No  . Drug use: No  . Sexual activity: Not on file   Other Topics Concern  . Not on file   Social History Narrative  . No narrative on file     Current Outpatient Prescriptions:  .  amitriptyline (ELAVIL) 25 MG tablet, Take 1 tablet by mouth at bedtime as needed., Disp: , Rfl:  .  gabapentin (NEURONTIN)  300 MG capsule, Take 600 mg by mouth 3 (three) times daily., Disp: , Rfl:  .  modafinil (PROVIGIL) 100 MG tablet, Take 1 tablet by mouth daily., Disp: , Rfl:  .  fexofenadine-pseudoephedrine (ALLEGRA-D ALLERGY & CONGESTION) 180-240 MG 24 hr tablet, Take 1 tablet by mouth daily., Disp: 30 tablet, Rfl: 0 .  fluticasone (FLONASE) 50 MCG/ACT nasal spray, Place 2 sprays into both nostrils daily., Disp: 16 g, Rfl: 2 .  Glatiramer Acetate (COPAXONE) 40 MG/ML SOSY, Inject 40 mg into the skin 3 (three) times a week., Disp: , Rfl:  .  linaclotide (LINZESS) 145 MCG CAPS capsule, Take 1 capsule (145 mcg total) by mouth daily., Disp: 30 capsule, Rfl: 5 .  SUMAtriptan (IMITREX) 100 MG tablet, Take 1 tablet  (100 mg total) by mouth every 2 (two) hours as needed for migraine. May repeat in 2 hours if headache persists or recurs., Disp: 10 tablet, Rfl: 0  Allergies  Allergen Reactions  . Penicillins Hives     ROS  Constitutional: Negative for fever, positive for weight change - gain  Respiratory: Negative for cough and shortness of breath.   Cardiovascular: Negative for chest pain or palpitations.  Gastrointestinal: Negative for abdominal pain, no bowel changes ( still has constipation ).  Musculoskeletal: Negative for gait problem or joint swelling.  Skin: Negative for rash.  Neurological: Negative for dizziness, positive for intermittent  headache.  No other specific complaints in a complete review of systems (except as listed in HPI above).  Objective  Vitals:   12/09/16 1032  BP: 124/82  Pulse: 95  Resp: 16  Temp: 98.3 F (36.8 C)  SpO2: 98%  Weight: 161 lb (73 kg)  Height: 5\' 2"  (1.575 m)    Body mass index is 29.45 kg/m.  Physical Exam  Constitutional: Patient appears well-developed and well-nourished. Overweight. No distress.  HEENT: head atraumatic, normocephalic, pupils equal and reactive to light, neck supple, throat within normal limits Cardiovascular: Normal rate, regular rhythm and normal heart sounds.  No murmur heard. No BLE edema. Pulmonary/Chest: Effort normal and breath sounds normal. No respiratory distress. Abdominal: Soft.  There is no tenderness. Psychiatric: Patient has a normal mood and affect. behavior is normal. Judgment and thought content normal. Muscular Skeletal: pain during palpation of right lower back, negative straight leg raise    Assessment & Plan  1. Migraine without aura and without status migrainosus, not intractable  - SUMAtriptan (IMITREX) 100 MG tablet; Take 1 tablet (100 mg total) by mouth every 2 (two) hours as needed for migraine. May repeat in 2 hours if headache persists or recurs.  Dispense: 10 tablet; Refill: 0  2.  Vitamin D deficiency disease  - VITAMIN D 25 Hydroxy (Vit-D Deficiency, Fractures)  3. Overweight (BMI 25.0-29.9)  She wants weight loss medication, discussed options, safest one for her in [place] - she states she will pay cash for it -Belviq XR 20 mg daily #30 and 2 refills - Insulin, fasting  4. Other neutropenia (HCC)  - CBC with Differential/Platelet  5. Dyslipidemia  Not fasting we will hold off on labs for now  6. Long-term use of high-risk medication  - COMPLETE METABOLIC PANEL WITH GFR  7. Chronic constipation  - linaclotide (LINZESS) 145 MCG CAPS capsule; Take 1 capsule (145 mcg total) by mouth daily.  Dispense: 30 capsule; Refill: 5  8. Seasonal allergic rhinitis, unspecified chronicity, unspecified trigger  - fluticasone (FLONASE) 50 MCG/ACT nasal spray; Place 2 sprays into both nostrils daily.  Dispense:  16 g; Refill: 2   9. Intermittent low back pain  Advised home exercises or PT

## 2017-02-21 ENCOUNTER — Encounter: Payer: Medicare Other | Admitting: Family Medicine

## 2017-04-15 DIAGNOSIS — J988 Other specified respiratory disorders: Secondary | ICD-10-CM | POA: Diagnosis not present

## 2017-05-24 ENCOUNTER — Encounter: Payer: Self-pay | Admitting: Family Medicine

## 2017-05-24 ENCOUNTER — Ambulatory Visit (INDEPENDENT_AMBULATORY_CARE_PROVIDER_SITE_OTHER): Payer: Medicare Other | Admitting: Family Medicine

## 2017-05-24 ENCOUNTER — Other Ambulatory Visit: Payer: Self-pay | Admitting: Family Medicine

## 2017-05-24 VITALS — BP 128/82 | HR 94 | Temp 98.4°F | Resp 16 | Ht 62.0 in | Wt 164.2 lb

## 2017-05-24 DIAGNOSIS — R5383 Other fatigue: Secondary | ICD-10-CM

## 2017-05-24 DIAGNOSIS — E663 Overweight: Secondary | ICD-10-CM

## 2017-05-24 DIAGNOSIS — F331 Major depressive disorder, recurrent, moderate: Secondary | ICD-10-CM | POA: Diagnosis not present

## 2017-05-24 DIAGNOSIS — Z79899 Other long term (current) drug therapy: Secondary | ICD-10-CM | POA: Diagnosis not present

## 2017-05-24 DIAGNOSIS — E785 Hyperlipidemia, unspecified: Secondary | ICD-10-CM | POA: Diagnosis not present

## 2017-05-24 DIAGNOSIS — Z0000 Encounter for general adult medical examination without abnormal findings: Secondary | ICD-10-CM

## 2017-05-24 DIAGNOSIS — Z124 Encounter for screening for malignant neoplasm of cervix: Secondary | ICD-10-CM

## 2017-05-24 DIAGNOSIS — K5909 Other constipation: Secondary | ICD-10-CM

## 2017-05-24 DIAGNOSIS — Z114 Encounter for screening for human immunodeficiency virus [HIV]: Secondary | ICD-10-CM

## 2017-05-24 DIAGNOSIS — E559 Vitamin D deficiency, unspecified: Secondary | ICD-10-CM

## 2017-05-24 DIAGNOSIS — D708 Other neutropenia: Secondary | ICD-10-CM | POA: Diagnosis not present

## 2017-05-24 DIAGNOSIS — Z1231 Encounter for screening mammogram for malignant neoplasm of breast: Secondary | ICD-10-CM

## 2017-05-24 DIAGNOSIS — F339 Major depressive disorder, recurrent, unspecified: Secondary | ICD-10-CM | POA: Insufficient documentation

## 2017-05-24 NOTE — Patient Instructions (Signed)
Preventive Care 40-64 Years, Female Preventive care refers to lifestyle choices and visits with your health care provider that can promote health and wellness. What does preventive care include?  A yearly physical exam. This is also called an annual well check.  Dental exams once or twice a year.  Routine eye exams. Ask your health care provider how often you should have your eyes checked.  Personal lifestyle choices, including: ? Daily care of your teeth and gums. ? Regular physical activity. ? Eating a healthy diet. ? Avoiding tobacco and drug use. ? Limiting alcohol use. ? Practicing safe sex. ? Taking low-dose aspirin daily starting at age 45. ? Taking vitamin and mineral supplements as recommended by your health care provider. What happens during an annual well check? The services and screenings done by your health care provider during your annual well check will depend on your age, overall health, lifestyle risk factors, and family history of disease. Counseling Your health care provider may ask you questions about your:  Alcohol use.  Tobacco use.  Drug use.  Emotional well-being.  Home and relationship well-being.  Sexual activity.  Eating habits.  Work and work Statistician.  Method of birth control.  Menstrual cycle.  Pregnancy history.  Screening You may have the following tests or measurements:  Height, weight, and BMI.  Blood pressure.  Lipid and cholesterol levels. These may be checked every 5 years, or more frequently if you are over 45 years old.  Skin check.  Lung cancer screening. You may have this screening every year starting at age 45 if you have a 30-pack-year history of smoking and currently smoke or have quit within the past 15 years.  Fecal occult blood test (FOBT) of the stool. You may have this test every year starting at age 45.  Flexible sigmoidoscopy or colonoscopy. You may have a sigmoidoscopy every 5 years or a colonoscopy  every 10 years starting at age 45.  Hepatitis C blood test.  Hepatitis B blood test.  Sexually transmitted disease (STD) testing.  Diabetes screening. This is done by checking your blood sugar (glucose) after you have not eaten for a while (fasting). You may have this done every 1-3 years.  Mammogram. This may be done every 1-2 years. Talk to your health care provider about when you should start having regular mammograms. This may depend on whether you have a family history of breast cancer.  BRCA-related cancer screening. This may be done if you have a family history of breast, ovarian, tubal, or peritoneal cancers.  Pelvic exam and Pap test. This may be done every 3 years starting at age 45. Starting at age 45, this may be done every 5 years if you have a Pap test in combination with an HPV test.  Bone density scan. This is done to screen for osteoporosis. You may have this scan if you are at high risk for osteoporosis.  Discuss your test results, treatment options, and if necessary, the need for more tests with your health care provider. Vaccines Your health care provider may recommend certain vaccines, such as:  Influenza vaccine. This is recommended every year.  Tetanus, diphtheria, and acellular pertussis (Tdap, Td) vaccine. You may need a Td booster every 10 years.  Varicella vaccine. You may need this if you have not been vaccinated.  Zoster vaccine. You may need this after age 5.  Measles, mumps, and rubella (MMR) vaccine. You may need at least one dose of MMR if you were born in  1956 or later. You may also need a second dose.  Pneumococcal 13-valent conjugate (PCV13) vaccine. You may need this if you have certain conditions and were not previously vaccinated.  Pneumococcal polysaccharide (PPSV23) vaccine. You may need one or two doses if you smoke cigarettes or if you have certain conditions.  Meningococcal vaccine. You may need this if you have certain  conditions.  Hepatitis A vaccine. You may need this if you have certain conditions or if you travel or work in places where you may be exposed to hepatitis A.  Hepatitis B vaccine. You may need this if you have certain conditions or if you travel or work in places where you may be exposed to hepatitis B.  Haemophilus influenzae type b (Hib) vaccine. You may need this if you have certain conditions.  Talk to your health care provider about which screenings and vaccines you need and how often you need them. This information is not intended to replace advice given to you by your health care provider. Make sure you discuss any questions you have with your health care provider. Document Released: 12/12/2015 Document Revised: 08/04/2016 Document Reviewed: 09/16/2015 Elsevier Interactive Patient Education  2017 [employer].

## 2017-05-24 NOTE — Progress Notes (Signed)
Name: Tammie Lopez   MRN: [id number]    DOB: 16-Apr-1971   Date:05/24/2017       Progress Note  Subjective  Chief Complaint  Chief Complaint  Patient presents with  . Annual Exam    HPI  Functional ability/safety issues: she gets tired fast secondary to MS and needs to take breaks.  Hearing issues: Addressed  Activities of daily living: Discussed Home safety issues: No Issues  End Of Life Planning: Offered verbal information regarding advanced directives, healthcare power of attorney.  Preventative care, Health maintenance, Preventative health measures discussed.  Preventative screenings discussed today: lab work, colonoscopy,  mammogram, DEXA.  Low Dose CT Chest recommended if Age 64-80 years, 30 pack-year currently smoking OR have quit w/in 15years.   Lifestyle risk factor issued reviewed: Diet, exercise, weight management, advised patient smoking is not healthy, nutrition/diet.  Preventative health measures discussed (5-10 year plan).  Reviewed and recommended vaccinations: - Pneumovax  - Prevnar  - Annual Influenza - Zostavax - Tdap   Depression screening: Done Fall risk screening: Done Discuss ADLs/IADLs: Done  Current medical providers: See HPI  Other health risk factors identified this visit: No other issues Cognitive impairment issues: None identified  All above discussed with patient. Appropriate education, counseling and referral will be made based upon the above.   Overweight: very frustrated about weight, could not afford weight loss medication - Belviq, explained that we can check for insulin resistance. She needs to return for blood work.   MS: continue follow up with neurologist, she has intermittent numbness and tingling on left leg and occasional weakness  Major depression: she states her weight makes her feel hopeless, she really would like to take something to help her lose weight. She denies suicidal thoughts or ideation.   Chronic  constipation: she takes Linzess prn. She denies pain with bowel movements or blood in stools. She has to strain if she does not take Linzess.  Dyslipidemia: she is due for labs.    Patient Active Problem List   Diagnosis Date Noted  . Major depression, recurrent ([place]) 05/24/2017  . Migraine without aura, not intractable 12/09/2016  . Overweight (BMI 25.0-29.9) 12/09/2016  . Nickel allergy 12/09/2016  . GERD without esophagitis 12/09/2016  . Leukopenia 12/09/2016  . Dyslipidemia 12/09/2016  . Diverticulosis 12/09/2016  . 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 Major depression in remission ([place]) 12/09/2016  . Allergic rhinitis, seasonal 12/09/2016  . Chronic constipation 12/09/2016  . Vitamin D deficiency disease 08/06/2013  . MS (multiple sclerosis) ([place]) 04/17/2013  . History of hypertension 06/21/2012    Past Surgical History:  Procedure Laterality Date  . BUNIONECTOMY    . CESAREAN SECTION N/A 2006  . GANGLION CYST EXCISION Bilateral    Hands    Family History  Problem Relation Age of Onset  . Diabetes Mother   . Hypertension Mother   . Cancer Father 75       Mouth  . COPD Father   . Hypertension Brother   . Diabetes Brother     Social History   Social History  . Marital status: Single    Spouse name: N/A  . Number of children: N/A  . Years of education: N/A   Occupational History  . Not on file.   Social History Main Topics  . Smoking status: Never Smoker  . Smokeless tobacco: Never Used  . Alcohol use No  . Drug use: No  . Sexual activity: Not Currently    Partners: Male  Other Topics Concern  . 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  amitriptyline (ELAVIL) 25 MG tablet, Take 1 tablet by mouth at bedtime as needed., Disp: , Rfl:  .  fexofenadine-pseudoephedrine (ALLEGRA-D ALLERGY & CONGESTION) 180-240 MG 24 hr  tablet, Take 1 tablet by mouth daily., Disp: 30 tablet, Rfl: 0 .  fluticasone (FLONASE) 50 MCG/ACT nasal spray, Place 2 sprays into both nostrils daily., Disp: 16 g, Rfl: 2 .  gabapentin (NEURONTIN) 300 MG capsule, Take 600 mg by mouth 3 (three) times daily., Disp: , Rfl:  .  Glatiramer Acetate (COPAXONE) 40 MG/ML SOSY, Inject 40 mg into the skin 3 (three) times a week., Disp: , Rfl:  .  linaclotide (LINZESS) 145 MCG CAPS capsule, Take 1 capsule (145 mcg total) by mouth daily., Disp: 30 capsule, Rfl: 5 .  modafinil (PROVIGIL) 100 MG tablet, Take 1 tablet by mouth daily., Disp: , Rfl:  .  SUMAtriptan (IMITREX) 100 MG tablet, Take 1 tablet (100 mg total) by mouth every 2 (two) hours as needed for migraine. May repeat in 2 hours if headache persists or recurs., Disp: 10 tablet, Rfl: 0  Allergies  Allergen Reactions  . Penicillins Hives     ROS  Constitutional: Negative for fever or weight change.  Respiratory: Negative for cough and shortness of breath.   Cardiovascular: Negative for chest pain or palpitations.  Gastrointestinal: Negative for abdominal pain, no bowel changes.  Musculoskeletal: Negative for gait problem or joint swelling.  Skin: Negative for rash.  Neurological: Negative for dizziness , positive for intermittent headache.  No other specific complaints in a complete review of systems (except as listed in HPI above).  Objective  Vitals:   05/24/17 1123  BP: 128/82  Pulse: 94  Resp: 16  Temp: 98.4 F (36.9 C)  TempSrc: Oral  SpO2: 98%  Weight: 164 lb 3.2 oz (74.5 kg)  Height: 5\' 2"  (1.575 m)    Body mass index is 30.03 kg/m.  Physical Exam  Constitutional: Patient appears well-developed and overweight. No distress.  HENT: Head: Normocephalic and atraumatic. Ears: B TMs ok, no erythema or effusion; Nose: Nose normal. Mouth/Throat: Oropharynx is clear and moist. No oropharyngeal exudate.  Eyes: Conjunctivae and EOM are normal. Pupils are equal, round, and  reactive to light. No scleral icterus.  Neck: Normal range of motion. Neck supple. No JVD present. No thyromegaly present.  Cardiovascular: Normal rate, regular rhythm and normal heart sounds.  No murmur heard. No BLE edema. Pulmonary/Chest: Effort normal and breath sounds normal. No respiratory distress. Abdominal: Soft. Bowel sounds are normal, no distension. There is no tenderness. no masses Breast: no lumps or masses, no nipple discharge or rashes FEMALE GENITALIA:  External genitalia normal External urethra normal Vaginal vault normal without discharge or lesions Cervix normal without discharge or lesions Bimanual exam normal without masses RECTAL: not done Musculoskeletal: Normal range of motion, no joint effusions. No gross deformities Neurological: he is alert and oriented to person, place, and time. No cranial nerve deficit. Coordination, balance, strength, speech and gait are normal.  Skin: Skin is warm and dry. No rash noted. No erythema.  Psychiatric: Patient has a normal mood and affect. behavior is normal. Judgment and thought content normal.  PHQ2/9: Depression screen PHQ 2/9 05/24/2017  Decreased Interest 1  Down, Depressed, Hopeless 1  PHQ - 2 Score 2  Altered sleeping 3  Tired, decreased energy 3  Change in appetite 3  Feeling bad or failure about yourself  1  Trouble concentrating 0  Moving slowly or fidgety/restless 1  Suicidal thoughts 0  PHQ-9 Score 13  Difficult doing work/chores Not difficult at all     Fall Risk: Fall Risk  05/24/2017  Falls in the past year? No    Functional Status Survey: Is the patient deaf or have difficulty hearing?: No Does the patient have difficulty seeing, even when wearing glasses/contacts?: No Does the patient have difficulty concentrating, remembering, or making decisions?: No Does the patient have difficulty walking or climbing stairs?: No Does the patient have difficulty dressing or bathing?: No Does the patient have  difficulty doing errands alone such as visiting a doctor's office or shopping?: No  Current Exercise Habits: Home exercise routine, Type of exercise: walking, Time (Minutes): 45, Frequency (Times/Week): 3, Weekly Exercise (Minutes/Week): 135, Intensity: Moderate    Assessment & Plan  1. Medicare annual wellness visit, subsequent  Discussed importance of 150 minutes of physical activity weekly, eat two servings of fish weekly, eat one serving of tree nuts ( cashews, pistachios, pecans, almonds.Marland Kitchen) every other day, eat 6 servings of fruit/vegetables daily and drink plenty of water and avoid sweet beverages.    2. Dyslipidemia  - Lipid panel  3. Other neutropenia (HCC)  -CBC  4. Vitamin D deficiency disease  - VITAMIN D 25 Hydroxy (Vit-D Deficiency, Fractures)  5. Overweight (BMI 25.0-29.9)  - Insulin, fasting  6. Encounter for screening for cervical cancer   - Pap IG w/ reflex to HPV when ASC-U  7. Encounter for screening mammogram for breast cancer  - MM Digital Screening; Future  8. Long-term use of high-risk medication  - COMPLETE METABOLIC PANEL WITH GFR - CBC with Differential/Platelet - Hemoglobin A1c  9. Encounter for screening for HIV  - HIV antibody - TSH  10. Other fatigue  - COMPLETE METABOLIC PANEL WITH GFR - CBC with Differential/Platelet - VITAMIN D 25 Hydroxy (Vit-D Deficiency, Fractures) - TSH  11. Chronic constipation  Continue Linzess  12. Moderate episode of recurrent major depressive disorder ([place])  Refuses medication or therapy

## 2017-05-25 ENCOUNTER — Other Ambulatory Visit: Payer: Self-pay | Admitting: Family Medicine

## 2017-05-25 ENCOUNTER — Telehealth: Payer: Self-pay | Admitting: Family Medicine

## 2017-05-25 DIAGNOSIS — E559 Vitamin D deficiency, unspecified: Secondary | ICD-10-CM | POA: Diagnosis not present

## 2017-05-25 DIAGNOSIS — E663 Overweight: Secondary | ICD-10-CM | POA: Diagnosis not present

## 2017-05-25 DIAGNOSIS — R5383 Other fatigue: Secondary | ICD-10-CM | POA: Diagnosis not present

## 2017-05-25 DIAGNOSIS — Z114 Encounter for screening for human immunodeficiency virus [HIV]: Secondary | ICD-10-CM | POA: Diagnosis not present

## 2017-05-25 DIAGNOSIS — E785 Hyperlipidemia, unspecified: Secondary | ICD-10-CM | POA: Diagnosis not present

## 2017-05-25 DIAGNOSIS — Z79899 Other long term (current) drug therapy: Secondary | ICD-10-CM | POA: Diagnosis not present

## 2017-05-25 LAB — CBC WITH DIFFERENTIAL/PLATELET
Basophils Absolute: 0 cells/uL (ref 0–200)
Basophils Relative: 0 %
Eosinophils Absolute: 124 cells/uL (ref 15–500)
Eosinophils Relative: 4 %
HCT: 38.8 % (ref 35.0–45.0)
Hemoglobin: 12.7 g/dL (ref 11.7–15.5)
Lymphocytes Relative: 48 %
Lymphs Abs: 1488 cells/uL (ref 850–3900)
MCH: 27.4 pg (ref 27.0–33.0)
MCHC: 32.7 g/dL (ref 32.0–36.0)
MCV: 83.6 fL (ref 80.0–100.0)
MPV: 10.5 fL (ref 7.5–12.5)
Monocytes Absolute: 310 cells/uL (ref 200–950)
Monocytes Relative: 10 %
Neutro Abs: 1178 cells/uL — ABNORMAL LOW (ref 1500–7800)
Neutrophils Relative %: 38 %
Platelets: 248 10*3/uL (ref 140–400)
RBC: 4.64 MIL/uL (ref 3.80–5.10)
RDW: 13.2 % (ref 11.0–15.0)
WBC: 3.1 10*3/uL — ABNORMAL LOW (ref 3.8–10.8)

## 2017-05-25 LAB — TSH: TSH: 0.96 mIU/L

## 2017-05-25 NOTE — Telephone Encounter (Signed)
Pt was seen yesterday and had mentioned to you about a referral to podiatry for the bunion on right foot. However, pt states that she has not received the referral yet. Would like to know if she need to make another appt or are you able to make the referral from yesterday visit. [phone]

## 2017-05-25 NOTE — Telephone Encounter (Signed)
Not on the chart, it must be documented for referral to be placed

## 2017-05-26 LAB — LIPID PANEL
Cholesterol: 182 mg/dL (ref <200)
HDL: 61 mg/dL (ref >50)
LDL Cholesterol: 110 mg/dL — ABNORMAL HIGH (ref <100)
Total CHOL/HDL Ratio: 3 Ratio (ref <5.0)
Triglycerides: 57 mg/dL (ref <150)
VLDL: 11 mg/dL (ref <30)

## 2017-05-26 LAB — VITAMIN D 25 HYDROXY (VIT D DEFICIENCY, FRACTURES): Vit D, 25-Hydroxy: 11 ng/mL — ABNORMAL LOW (ref 30–100)

## 2017-05-26 LAB — COMPLETE METABOLIC PANEL WITH GFR
ALT: 7 U/L (ref 6–29)
AST: 10 U/L (ref 10–35)
Albumin: 4.2 g/dL (ref 3.6–5.1)
Alkaline Phosphatase: 30 U/L — ABNORMAL LOW (ref 33–115)
BUN: 9 mg/dL (ref 7–25)
CO2: 23 mmol/L (ref 20–31)
Calcium: 9 mg/dL (ref 8.6–10.2)
Chloride: 105 mmol/L (ref 98–110)
Creat: 0.61 mg/dL (ref 0.50–1.10)
GFR, Est African American: >89 mL/min (ref >=60)
GFR, Est Non African American: >89 mL/min (ref >=60)
Glucose, Bld: 93 mg/dL (ref 65–99)
Potassium: 4.1 mmol/L (ref 3.5–5.3)
Sodium: 141 mmol/L (ref 135–146)
Total Bilirubin: 0.5 mg/dL (ref 0.2–1.2)
Total Protein: 6.9 g/dL (ref 6.1–8.1)

## 2017-05-26 LAB — PAP IG W/ RFLX HPV ASCU

## 2017-05-26 LAB — HEMOGLOBIN A1C
Hgb A1c MFr Bld: 5.4 % (ref <5.7)
Mean Plasma Glucose: 108 mg/dL

## 2017-05-26 LAB — HIV ANTIBODY (ROUTINE TESTING W REFLEX): HIV 1&2 Ab, 4th Generation: NONREACTIVE

## 2017-05-26 LAB — INSULIN, FASTING: Insulin fasting, serum: 6.7 u[IU]/mL (ref 2.0–19.6)

## 2017-05-26 NOTE — Telephone Encounter (Signed)
Please schedule patient an appointment for problem. Thanks

## 2017-05-27 ENCOUNTER — Other Ambulatory Visit: Payer: Self-pay | Admitting: Family Medicine

## 2017-05-27 MED ORDER — VITAMIN D (ERGOCALCIFEROL) 1.25 MG (50000 UNIT) PO CAPS: 50000.0000 [IU] | ORAL_CAPSULE | ORAL | 0 refills | Status: DC

## 2017-05-27 NOTE — Telephone Encounter (Signed)
Pt called checking status on referral. Pt informed that she must be seen. Appt made for Monday 05-30-17

## 2017-05-30 ENCOUNTER — Encounter: Payer: Self-pay | Admitting: Family Medicine

## 2017-05-30 ENCOUNTER — Ambulatory Visit (INDEPENDENT_AMBULATORY_CARE_PROVIDER_SITE_OTHER): Payer: Medicare Other | Admitting: Family Medicine

## 2017-05-30 VITALS — BP 138/86 | HR 81 | Temp 98.0°F | Resp 16 | Ht 62.0 in | Wt 162.4 lb

## 2017-05-30 DIAGNOSIS — E8881 Metabolic syndrome: Secondary | ICD-10-CM

## 2017-05-30 DIAGNOSIS — E669 Obesity, unspecified: Secondary | ICD-10-CM

## 2017-05-30 DIAGNOSIS — G43009 Migraine without aura, not intractable, without status migrainosus: Secondary | ICD-10-CM | POA: Diagnosis not present

## 2017-05-30 DIAGNOSIS — M21611 Bunion of right foot: Secondary | ICD-10-CM

## 2017-05-30 DIAGNOSIS — E559 Vitamin D deficiency, unspecified: Secondary | ICD-10-CM

## 2017-05-30 DIAGNOSIS — H40153 Residual stage of open-angle glaucoma, bilateral: Secondary | ICD-10-CM | POA: Diagnosis not present

## 2017-05-30 DIAGNOSIS — J069 Acute upper respiratory infection, unspecified: Secondary | ICD-10-CM

## 2017-05-30 MED ORDER — DULAGLUTIDE 1.5 MG/0.5ML ~~LOC~~ SOAJ: 1.5000 mg | SUBCUTANEOUS | 2 refills | Status: DC

## 2017-05-30 MED ORDER — SUMATRIPTAN SUCCINATE 100 MG PO TABS: 100.0000 mg | ORAL_TABLET | ORAL | 1 refills | Status: DC | PRN

## 2017-05-30 MED ORDER — BENZONATATE 100 MG PO CAPS: 100.0000 mg | ORAL_CAPSULE | Freq: Two times a day (BID) | ORAL | 0 refills | Status: DC | PRN

## 2017-05-30 NOTE — Progress Notes (Signed)
Name: Tammie Lopez   MRN: [id number]    DOB: Jan 14, 1971   Date:05/30/2017       Progress Note  Subjective  Chief Complaint  Chief Complaint  Patient presents with  . Migraine    Needs refill of Imitrex, has had a migraine since this weekend and is getting better but has ran out of her medication.  . Referral    Needs referral for removal of her right foot bunionectomy.     HPI  Bunion; she had bunionectomy left side years ago, she does not recall the Podiatrist, but thinks it was Dr. Elvina Mattes. She states the right bunion is now causing an aching pain, it is causing problems wearing shoes, having to buy a size larger, mostly wearing slides.   Migraine : she states that she did not have refills of Imitrex, she developed pain on right side of head, throbbing, behind right eye was very intense. Associated with photophobia, phonophobia, and nausea. Starting 2 days ago, doing better now, but still mild headache, dull like at this time  Obese/insulin resistance: slightly elevated fasting insulin ( above 5 ) she is struggling weigh weight loss, we discussed options, she prefers not taking Metformin because of diarrhea as a side effect, she is willing to try a GLP-1 agonist  URI: she developed rhinorrhea, post-nasal drainage, a dry cough over the past few days, she had some chills yesterday. Mild decrease in appetite.   Patient Active Problem List   Diagnosis Date Noted  . Major depression, recurrent ([place]) 05/24/2017  . Migraine without aura, not intractable 12/09/2016  . Obese 12/09/2016  . Nickel allergy 12/09/2016  . GERD without esophagitis 12/09/2016  . Leukopenia 12/09/2016  . Dyslipidemia 12/09/2016  . Diverticulosis 12/09/2016  . 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 Major depression in remission ([place]) 12/09/2016  . Allergic rhinitis, seasonal 12/09/2016  . Chronic constipation 12/09/2016  . Vitamin D deficiency disease 08/06/2013  . MS (multiple sclerosis) ([place]) 04/17/2013  . History of  hypertension 06/21/2012    Past Surgical History:  Procedure Laterality Date  . BUNIONECTOMY    . CESAREAN SECTION N/A 2006  . GANGLION CYST EXCISION Bilateral    Hands    Family History  Problem Relation Age of Onset  . Diabetes Mother   . Hypertension Mother   . Cancer Father 75       Mouth  . COPD Father   . Hypertension Brother   . Diabetes Brother     Social History   Social History  . Marital status: Single    Spouse name: N/A  . Number of children: N/A  . Years of education: N/A   Occupational History  . Not on file.   Social History Main Topics  . Smoking status: Never Smoker  . Smokeless tobacco: Never Used  . Alcohol use No  . Drug use: No  . Sexual activity: Not Currently    Partners: Male   Other Topics Concern  . 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  amitriptyline (ELAVIL) 25 MG tablet, Take 1 tablet by mouth at bedtime as needed., Disp: , Rfl:  .  fexofenadine-pseudoephedrine (ALLEGRA-D ALLERGY & CONGESTION) 180-240 MG 24 hr tablet, Take 1 tablet by mouth daily., Disp: 30 tablet, Rfl: 0 .  fluticasone (FLONASE) 50 MCG/ACT nasal spray, Place 2  sprays into both nostrils daily., Disp: 16 g, Rfl: 2 .  gabapentin (NEURONTIN) 300 MG capsule, Take 600 mg by mouth 3 (three) times daily., Disp: , Rfl:  .  Glatiramer Acetate (COPAXONE) 40 MG/ML SOSY, Inject 40 mg into the skin 3 (three) times a week., Disp: , Rfl:  .  linaclotide (LINZESS) 145 MCG CAPS capsule, Take 1 capsule (145 mcg total) by mouth daily., Disp: 30 capsule, Rfl: 5 .  modafinil (PROVIGIL) 100 MG tablet, Take 1 tablet by mouth daily., Disp: , Rfl:  .  SUMAtriptan (IMITREX) 100 MG tablet, Take 1 tablet (100 mg total) by mouth every 2 (two) hours as needed for migraine. May repeat in 2 hours if headache persists or recurs., Disp: 10 tablet, Rfl:  0 .  Vitamin D, Ergocalciferol, (DRISDOL) 50000 units CAPS capsule, Take 1 capsule (50,000 Units total) by mouth every 7 (seven) days., Disp: 12 capsule, Rfl: 0 .  Dulaglutide (TRULICITY) 1.5 ZD/6.6YQ SOPN, Inject 1.5 mg into the skin once a week., Disp: 4 pen, Rfl: 2  Allergies  Allergen Reactions  . Penicillins Hives     ROS  Constitutional: Negative for fever or weight change.  Respiratory: Positive for cough but no  shortness of breath.   Cardiovascular: Negative for chest pain or palpitations.  Gastrointestinal: Negative for abdominal pain, no bowel changes.  Musculoskeletal: Negative for gait problem or joint swelling.  Skin: Negative for rash.  Neurological: Negative for dizziness or headache.  No other specific complaints in a complete review of systems (except as listed in HPI above).  Objective  Vitals:   05/30/17 0904  BP: 138/86  Pulse: 81  Resp: 16  Temp: 98 F (36.7 C)  TempSrc: Oral  SpO2: 98%  Weight: 162 lb 6.4 oz (73.7 kg)  Height: _0  (1.575 m)    Body mass index is 29.7 kg/m.  Physical Exam  Constitutional: Patient appears well-developed and well-nourished. Obese  No distress.  HEENT: head atraumatic, normocephalic, pupils equal and reactive to light,  neck supple, throat within normal limits, clear rhinorrhea, boggy turbinates  Cardiovascular: Normal rate, regular rhythm and normal heart sounds.  No murmur heard. No BLE edema. Pulmonary/Chest: Effort normal and breath sounds normal. No respiratory distress. Abdominal: Soft.  There is no tenderness. Psychiatric: Patient has a normal mood and affect. behavior is normal. Judgment and thought content normal. Muscular skeletal: bunion on right big toe  Recent Results (from the past 2160 hour(s))  COMPLETE METABOLIC PANEL WITH GFR     Status: Abnormal   Collection Time: 05/24/17  9:12 AM  Result Value Ref Range   Sodium 141 135 - 146 mmol/L   Potassium 4.1 3.5 - 5.3 mmol/L   Chloride 105 98 -  110 mmol/L   CO2 23 20 - 31 mmol/L   Glucose, Bld 93 65 - 99 mg/dL   BUN 9 7 - 25 mg/dL   Creat 0.61 0.50 - 1.10 mg/dL   Total Bilirubin 0.5 0.2 - 1.2 mg/dL   Alkaline Phosphatase 30 (L) 33 - 115 U/L   AST 10 10 - 35 U/L   ALT 7 6 - 29 U/L   Total Protein 6.9 6.1 - 8.1 g/dL   Albumin 4.2 3.6 - 5.1 g/dL   Calcium 9.0 8.6 - 10.2 mg/dL   GFR, Est African American >89 >=60 mL/min   GFR, Est Non African American >89 >=60 mL/min  CBC with Differential/Platelet     Status: Abnormal   Collection Time: 05/24/17  9:12  AM  Result Value Ref Range   WBC 3.1 (L) 3.8 - 10.8 K/uL   RBC 4.64 3.80 - 5.10 MIL/uL   Hemoglobin 12.7 11.7 - 15.5 g/dL   HCT 38.8 35.0 - 45.0 %   MCV 83.6 80.0 - 100.0 fL   MCH 27.4 27.0 - 33.0 pg   MCHC 32.7 32.0 - 36.0 g/dL   RDW 13.2 11.0 - 15.0 %   Platelets 248 140 - 400 K/uL   MPV 10.5 7.5 - 12.5 fL   Neutro Abs 1,178 (L) 1,500 - 7,800 cells/uL   Lymphs Abs 1,488 850 - 3,900 cells/uL   Monocytes Absolute 310 200 - 950 cells/uL   Eosinophils Absolute 124 15 - 500 cells/uL   Basophils Absolute 0 0 - 200 cells/uL   Neutrophils Relative % 38 %   Lymphocytes Relative 48 %   Monocytes Relative 10 %   Eosinophils Relative 4 %   Basophils Relative 0 %   Smear Review Criteria for review not met   Insulin, fasting     Status: None   Collection Time: 05/24/17  9:12 AM  Result Value Ref Range   Insulin fasting, serum 6.7 2.0 - 19.6 uIU/mL    Comment:   This insulin assay shows strong cross-reactivity for some insulin analogs (lispro, aspart, and glargine) and much lower cross-reactivity with others (detemir, glulisine).   Stimulated Insulin reference intervals were established using the Siemens Immulite assay. These values are provided for general guidance only.   Hemoglobin A1c     Status: None   Collection Time: 05/24/17  9:12 AM  Result Value Ref Range   Hgb A1c MFr Bld 5.4 <5.7 %    Comment:   For the purpose of screening for the presence of diabetes:    <5.7%       Consistent with the absence of diabetes 5.7-6.4 %   Consistent with increased risk for diabetes (prediabetes) >=6.5 %     Consistent with diabetes   This assay result is consistent with a decreased risk of diabetes.   Currently, no consensus exists regarding use of hemoglobin A1c for diagnosis of diabetes in children.   According to American Diabetes Association (ADA) guidelines, hemoglobin A1c <7.0% represents optimal control in non-pregnant diabetic patients. Different metrics may apply to specific patient populations. Standards of Medical Care in Diabetes (ADA).      Mean Plasma Glucose 108 mg/dL  HIV antibody     Status: None   Collection Time: 05/24/17  9:12 AM  Result Value Ref Range   HIV 1&2 Ab, 4th Generation NONREACTIVE NONREACTIVE    Comment:   HIV-1 antigen and HIV-1/HIV-2 antibodies were not detected.  There is no laboratory evidence of HIV infection.   HIV-1/2 Antibody Diff        Not indicated. HIV-1 RNA, Qual TMA          Not indicated.     PLEASE NOTE: This information has been disclosed to you from records whose confidentiality may be protected by state law. If your state requires such protection, then the state law prohibits you from making any further disclosure of the information without the specific written consent of the person to whom it pertains, or as otherwise permitted by law. A general authorization for the release of medical or other information is NOT sufficient for this purpose.   The performance of this assay has not been clinically validated in patients less than 57 years old.   For additional information please refer  to http://education.questdiagnostics.com/faq/FAQ106.  (This link is being provided for informational/educational purposes only.)     Lipid panel     Status: Abnormal   Collection Time: 05/24/17  9:12 AM  Result Value Ref Range   Cholesterol 182 <200 mg/dL   Triglycerides 57 <150 mg/dL   HDL 61 >50 mg/dL    Total CHOL/HDL Ratio 3.0 <5.0 Ratio   VLDL 11 <30 mg/dL   LDL Cholesterol 110 (H) <100 mg/dL  VITAMIN D 25 Hydroxy (Vit-D Deficiency, Fractures)     Status: Abnormal   Collection Time: 05/24/17  9:12 AM  Result Value Ref Range   Vit D, 25-Hydroxy 11 (L) 30 - 100 ng/mL    Comment: Vitamin D Status           25-OH Vitamin D        Deficiency                <20 ng/mL        Insufficiency         20 - 29 ng/mL        Optimal             > or = 30 ng/mL   For 25-OH Vitamin D testing on patients on D2-supplementation and patients for whom quantitation of D2 and D3 fractions is required, the [facility] 25-OH VIT D, (D2,D3), LC/MS/MS is recommended: order code [id number] (patients > 2 yrs).   TSH     Status: None   Collection Time: 05/24/17  9:12 AM  Result Value Ref Range   TSH 0.96 mIU/L    Comment:   Reference Range   > or = 20 Years  0.40-4.50   Pregnancy Range First trimester  0.26-2.66 Second trimester 0.55-2.73 Third trimester  0.43-2.91     Pap IG w/ reflex to HPV when ASC-U     Status: None   Collection Time: 05/24/17 12:17 PM  Result Value Ref Range   Specimen adequacy:      Comment: SATISFACTORY.  Endocervical/transformation zone component present. The specimen is partially obscured by blood.    FINAL DIAGNOSIS:      Comment: - NEGATIVE FOR INTRAEPITHELIAL LESIONS OR MALIGNANCY.    COMMENTS:      Comment: LMP (Last Menstrual Period) of the patient is not given. This Pap test has been evaluated with computer assisted technology.    Cytotechnologist:      Comment: JLT, BS CT(ASCP) *  The Pap is a screening test for cervical cancer. It is not a  diagnostic test and is subject to false negative and false positive  results. It is most reliable when a satisfactory sample, regularly  obtained, is submitted with relevant clinical findings and history,  and when the Pap result is evaluated along with historic and current  clinical information.       PHQ2/9: Depression screen PHQ 2/9 05/24/2017  Decreased Interest 1  Down, Depressed, Hopeless 1  PHQ - 2 Score 2  Altered sleeping 3  Tired, decreased energy 3  Change in appetite 3  Feeling bad or failure about yourself  1  Trouble concentrating 0  Moving slowly or fidgety/restless 1  Suicidal thoughts 0  PHQ-9 Score 13  Difficult doing work/chores Not difficult at all     Fall Risk: Fall Risk  05/24/2017  Falls in the past year? No     Assessment & Plan  1. Bunion of great toe of right foot  - Ambulatory referral to Podiatry  2. Migraine  without aura and without status migrainosus, not intractable  - SUMAtriptan (IMITREX) 100 MG tablet; Take 1 tablet (100 mg total) by mouth every 2 (two) hours as needed for migraine. May repeat in 2 hours if headache persists or recurs.  Dispense: 9 tablet; Refill: 1  3. URI, acute  - benzonatate (TESSALON) 100 MG capsule; Take 1-2 capsules (100-200 mg total) by mouth 2 (two) times daily as needed for cough.  Dispense: 40 capsule; Refill: 0  4. Vitamin D deficiency disease  rx sent to pharmacy  5. Insulin resistance  - Dulaglutide (TRULICITY) 1.5 HA/7.0UN SOPN; Inject 1.5 mg into the skin once a week.  Dispense: 4 pen; Refill: 2  6. Class 1 obesity with serious comorbidity in adult, unspecified BMI, unspecified obesity type  Discussed all optiosns for weight loss medications including Belviq, Qsymia, Saxenda and Contrave. Discussed risk and benefits of each of them. She is willing to try Trulicity since it is once a week and also works for insulin resistance. She denies family history of thyroid cancer or personal history of pancreatitis - Dulaglutide (TRULICITY) 1.5 MM/6.45YO SOPN; Inject 1.5 mg into the skin once a week.  Dispense: 4 pen; Refill: 2

## 2017-06-06 MED FILL — COPAXONE/40MG/ML/SOSY: COPAXONE/40MG/ML/SOSY | 28 days supply | Qty: 12 | Fill #3

## 2017-06-30 NOTE — Unmapped (Signed)
Specialty Pharmacy Refill Coordination Note     Hannah Barr is a 46 y.o. female contacted today regarding refills of her specialty medication(s).    Reviewed and verified with patient:      Specialty medication(s) and dose(s) confirmed: yes  Changes to medications: no  Changes to insurance: no    Medication Adherence    Patient reported X missed doses in the last month:  0  Specialty Medication:  COPAXONE  Informant:  patient  Confirmed plan for next specialty medication refill:  delivery by pharmacy  Medication Assistance Program  Refill Coordination  Has the Patient's Contact Information Changed:  No  Is the Shipping Address Different:  No  Shipping Information  Delivery Scheduled:  Yes  Delivery Date:  07/05/17  Medications to be Shipped:  COPAXONE          Follow-up: 3 week(s)     Westley Gambles  Specialty Pharmacy Technician

## 2017-07-04 MED FILL — COPAXONE/40MG/ML/SOSY: COPAXONE/40MG/ML/SOSY | 28 days supply | Qty: 12 | Fill #4

## 2017-07-06 DIAGNOSIS — M79671 Pain in right foot: Secondary | ICD-10-CM | POA: Diagnosis not present

## 2017-07-06 DIAGNOSIS — M2011 Hallux valgus (acquired), right foot: Secondary | ICD-10-CM | POA: Diagnosis not present

## 2017-08-02 MED ORDER — EMPTY CONTAINER
2 refills | 0 days
Start: 2017-08-02 — End: 2018-08-02

## 2017-08-02 MED FILL — SHARPS KIT/NA/MISC: SHARPS KIT/NA/MISC | 90 days supply | Qty: 1 | Fill #0

## 2017-08-02 MED FILL — COPAXONE/40MG/ML/SOSY: COPAXONE/40MG/ML/SOSY | 28 days supply | Qty: 12 | Fill #5

## 2017-08-02 NOTE — Unmapped (Signed)
Specialty Pharmacy Refill Coordination Note     Hannah Barr is a 46 y.o. female contacted today regarding refills of her specialty medication(s).    Reviewed and verified with patient:      Specialty medication(s) and dose(s) confirmed: yes  Changes to medications: no  Changes to insurance: no    Medication Adherence    Patient reported X missed doses in the last month:  0  Specialty Medication:  COPAXONE  Patient is on additional specialty medications:  No  Informant:  patient  Confirmed plan for next specialty medication refill:  delivery by pharmacy  Medication Assistance Program  Refill Coordination  Is the Shipping Address Different:  No  Shipping Information  Delivery Scheduled:  Yes  Delivery Date:  08/04/17  Medications to be Shipped:  COPAXONE  SHARPS KIT          Follow-up: 3  week(s)     PT REQUESTED SHARPS KIT     Westley Gambles  Specialty Pharmacy Technician

## 2017-08-09 ENCOUNTER — Encounter: Payer: Self-pay | Admitting: Emergency Medicine

## 2017-08-09 ENCOUNTER — Other Ambulatory Visit: Payer: Self-pay | Admitting: Podiatry

## 2017-08-09 ENCOUNTER — Encounter: Payer: Self-pay | Admitting: Family Medicine

## 2017-08-09 ENCOUNTER — Ambulatory Visit (INDEPENDENT_AMBULATORY_CARE_PROVIDER_SITE_OTHER): Payer: Medicare Other | Admitting: Family Medicine

## 2017-08-09 VITALS — BP 110/76 | HR 100 | Temp 98.3°F | Resp 16 | Ht 62.0 in | Wt 155.4 lb

## 2017-08-09 DIAGNOSIS — R110 Nausea: Secondary | ICD-10-CM | POA: Diagnosis not present

## 2017-08-09 DIAGNOSIS — K5730 Diverticulosis of large intestine without perforation or abscess without bleeding: Secondary | ICD-10-CM

## 2017-08-09 DIAGNOSIS — R197 Diarrhea, unspecified: Secondary | ICD-10-CM

## 2017-08-09 DIAGNOSIS — R1084 Generalized abdominal pain: Secondary | ICD-10-CM

## 2017-08-09 LAB — BASIC METABOLIC PANEL WITH GFR
BUN: 11 mg/dL (ref 7–25)
CO2: 25 mmol/L (ref 20–32)
Calcium: 9.5 mg/dL (ref 8.6–10.2)
Chloride: 106 mmol/L (ref 98–110)
Creat: 0.66 mg/dL (ref 0.50–1.10)
GFR, Est African American: 124 mL/min/{1.73_m2} (ref >=60)
GFR, Est Non African American: 107 mL/min/{1.73_m2} (ref >=60)
Glucose, Bld: 102 mg/dL — ABNORMAL HIGH (ref 65–99)
Potassium: 4.2 mmol/L (ref 3.5–5.3)
Sodium: 138 mmol/L (ref 135–146)

## 2017-08-09 LAB — CBC WITH DIFFERENTIAL/PLATELET
Basophils Absolute: 12 cells/uL (ref 0–200)
Basophils Relative: 0.2 %
Eosinophils Absolute: 171 cells/uL (ref 15–500)
Eosinophils Relative: 2.9 %
HCT: 39.9 % (ref 35.0–45.0)
Hemoglobin: 13.7 g/dL (ref 11.7–15.5)
Lymphs Abs: 1251 cells/uL (ref 850–3900)
MCH: 27.7 pg (ref 27.0–33.0)
MCHC: 34.3 g/dL (ref 32.0–36.0)
MCV: 80.6 fL (ref 80.0–100.0)
MPV: 11.2 fL (ref 7.5–12.5)
Monocytes Relative: 8.2 %
Neutro Abs: 3983 cells/uL (ref 1500–7800)
Neutrophils Relative %: 67.5 %
Platelets: 269 10*3/uL (ref 140–400)
RBC: 4.95 10*6/uL (ref 3.80–5.10)
RDW: 12.7 % (ref 11.0–15.0)
Total Lymphocyte: 21.2 %
WBC mixed population: 484 cells/uL (ref 200–950)
WBC: 5.9 10*3/uL (ref 3.8–10.8)

## 2017-08-09 MED ORDER — ONDANSETRON HCL 4 MG PO TABS: 4.0000 mg | ORAL_TABLET | Freq: Three times a day (TID) | ORAL | 0 refills | Status: DC | PRN

## 2017-08-09 MED ORDER — DICYCLOMINE HCL 10 MG PO CAPS: 10.0000 mg | ORAL_CAPSULE | Freq: Three times a day (TID) | ORAL | 0 refills | Status: DC | PRN

## 2017-08-09 MED ORDER — CIPROFLOXACIN HCL 500 MG PO TABS: 500.0000 mg | ORAL_TABLET | Freq: Two times a day (BID) | ORAL | 0 refills | Status: AC

## 2017-08-09 MED ORDER — METRONIDAZOLE 500 MG PO TABS: 500.0000 mg | ORAL_TABLET | Freq: Three times a day (TID) | ORAL | 0 refills | Status: AC

## 2017-08-09 NOTE — Patient Instructions (Addendum)
Clear Liquids for today and tomorrow, then advance to bland diet slowly as tolerated.  Diverticulitis Diverticulitis is when small pockets in your large intestine (colon) get infected or swollen. This causes stomach pain and watery poop (diarrhea). These pouches are called diverticula. They form in people who have a condition called diverticulosis. Follow these instructions at home: Medicines  Take over-the-counter and prescription medicines only as told by your doctor. These include: ? Antibiotics. ? Pain medicines. ? Fiber pills. ? Probiotics. ? Stool softeners.  Do not drive or use heavy machinery while taking prescription pain medicine.  If you were prescribed an antibiotic, take it as told. Do not stop taking it even if you feel better. General instructions  Follow a diet as told by your doctor.  When you feel better, your doctor may tell you to change your diet. You may need to eat a lot of fiber. Fiber makes it easier to poop (have bowel movements). Healthy foods with fiber include: ? Berries. ? Beans. ? Lentils. ? Green vegetables.  Exercise 3 or more times a week. Aim for 30 minutes each time. Exercise enough to sweat and make your heart beat faster.  Keep all follow-up visits as told. This is important. You may need to have an exam of the large intestine. This is called a colonoscopy. Contact a doctor if:  Your pain does not get better.  You have a hard time eating or drinking.  You are not pooping like normal. Get help right away if:  Your pain gets worse.  Your problems do not get better.  Your problems get worse very fast.  You have a fever.  You throw up (vomit) more than one time.  You have poop that is: ? Bloody. ? Black. ? Tarry. Summary  Diverticulitis is when small pockets in your large intestine (colon) get infected or swollen.  Take medicines only as told by your doctor.  Follow a diet as told by your doctor. This information is not  intended to replace advice given to you by your health care provider. Make sure you discuss any questions you have with your health care provider. Document Released: 05/03/2008 Document Revised: 12/02/2016 Document Reviewed: 12/02/2016 Elsevier Interactive Patient Education  2017 [place] Diet A bland diet consists of foods that do not have a lot of fat or fiber. Foods without fat or fiber are easier for the body to digest. They are also less likely to irritate your mouth, throat, stomach, and other parts of your gastrointestinal tract. A bland diet is sometimes called a BRAT diet. What is my plan? Your health care provider or dietitian may recommend specific changes to your diet to prevent and treat your symptoms, such as:  Eating small meals often.  Cooking food until it is soft enough to chew easily.  Chewing your food well.  Drinking fluids slowly.  Not eating foods that are very spicy, sour, or fatty.  Not eating citrus fruits, such as oranges and grapefruit.  What do I need to know about this diet?  Eat a variety of foods from the bland diet food list.  Do not follow a bland diet longer than you have to.  Ask your health care provider whether you should take vitamins. What foods can I eat? Grains  Hot cereals, such as cream of wheat. Bread, crackers, or tortillas made from refined white flour. Rice. Vegetables Canned or cooked vegetables. Mashed or boiled potatoes. Fruits Bananas. Applesauce. Other types of cooked or  canned fruit with the skin and seeds removed, such as canned peaches or pears. Meats and Other Protein Sources Scrambled eggs. Creamy peanut butter or other nut butters. Lean, well-cooked meats, such as chicken or fish. Tofu. Soups or broths. Dairy Low-fat dairy products, such as milk, cottage cheese, or yogurt. Beverages Water. Herbal tea. Apple juice. Sweets and Desserts Pudding. Custard. Fruit gelatin. Ice cream. Fats and Oils Mild salad  dressings. Canola or olive oil. The items listed above may not be a complete list of allowed foods or beverages. Contact your dietitian for more options. What foods are not recommended? Foods and ingredients that are often not recommended include:  Spicy foods, such as hot sauce or salsa.  Fried foods.  Sour foods, such as pickled or fermented foods.  Raw vegetables or fruits, especially citrus or berries.  Caffeinated drinks.  Alcohol.  Strongly flavored seasonings or condiments.  The items listed above may not be a complete list of foods and beverages that are not allowed. Contact your dietitian for more information. This information is not intended to replace advice given to you by your health care provider. Make sure you discuss any questions you have with your health care provider. Document Released: 03/08/2016 Document Revised: 04/22/2016 Document Reviewed: 11/27/2014 Elsevier Interactive Patient Education  2018 [employer].

## 2017-08-09 NOTE — Progress Notes (Addendum)
Name: Tammie Lopez   MRN: [id number]    DOB: 04/22/1971   Date:08/09/2017       Progress Note  Subjective  Chief Complaint  Chief Complaint  Patient presents with  . Abdominal Pain    sweats since yesterday  . Headache  . Nausea    HPI  Patient presents with complaint of new onset generalized abdominal pain, diarrhea x4 episodes, headache, nausea, fatigue, decreased appetite, fevers and chills since yesterday. 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  No history of abdominal surgeries.  Does have history of Diverticulosis - has never had diverticulitis. Patient also takes Copaxone for MS - typically has leukopenia because of this - we will check labs today.  Has been drinking plenty of water, but this made her pain much worse.  She took Dexilant yesterday and it did not help.  Patient Active Problem List   Diagnosis Date Noted  . Major depression, recurrent (HCC) 05/24/2017  . Migraine without aura, not intractable 12/09/2016  . Obese 12/09/2016  . Nickel allergy 12/09/2016  . GERD without esophagitis 12/09/2016  . Leukopenia 12/09/2016  . Dyslipidemia 12/09/2016  . Diverticulosis 12/09/2016  . 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 Major depression in remission (HCC) 12/09/2016  . Allergic rhinitis, seasonal 12/09/2016  . Chronic constipation 12/09/2016  . Vitamin D deficiency disease 08/06/2013  . MS (multiple sclerosis) (HCC) 04/17/2013  . History of hypertension 06/21/2012    Social History  Substance Use Topics  . Smoking status: Never Smoker  . Smokeless tobacco: Never Used  . Alcohol use No     Current Outpatient Prescriptions:  .  Dulaglutide (TRULICITY) 1.5 MG/0.5ML SOPN, Inject 1.5 mg into the skin once a week., Disp: 4 pen, Rfl: 2 .  fexofenadine-pseudoephedrine (ALLEGRA-D ALLERGY & CONGESTION) 180-240 MG 24 hr tablet, Take 1 tablet by mouth daily., Disp: 30 tablet, Rfl: 0 .  fluticasone (FLONASE) 50 MCG/ACT nasal  spray, Place 2 sprays into both nostrils daily., Disp: 16 g, Rfl: 2 .  gabapentin (NEURONTIN) 300 MG capsule, Take 600 mg by mouth 3 (three) times daily., Disp: , Rfl:  .  Glatiramer Acetate (COPAXONE) 40 MG/ML SOSY, Inject 40 mg into the skin 3 (three) times a week., Disp: , Rfl:  .  linaclotide (LINZESS) 145 MCG CAPS capsule, Take 1 capsule (145 mcg total) by mouth daily., Disp: 30 capsule, Rfl: 5 .  SUMAtriptan (IMITREX) 100 MG tablet, Take 1 tablet (100 mg total) by mouth every 2 (two) hours as needed for migraine. May repeat in 2 hours if headache persists or recurs., Disp: 9 tablet, Rfl: 1 .  Vitamin D, Ergocalciferol, (DRISDOL) 50000 units CAPS capsule, Take 1 capsule (50,000 Units total) by mouth every 7 (seven) days., Disp: 12 capsule, Rfl: 0 .  modafinil (PROVIGIL) 100 MG tablet, Take 1 tablet by mouth daily., Disp: , Rfl:  .  ondansetron (ZOFRAN) 4 MG tablet, Take 1 tablet (4 mg total) by mouth every 8 (eight) hours as needed for nausea or vomiting., Disp: 20 tablet, Rfl: 0  Allergies  Allergen Reactions  . Penicillins Hives    ROS  Constitutional: Negative for fever or weight change.  Respiratory: Negative for cough and shortness of breath.   Cardiovascular: Negative for chest pain or palpitations.  Gastrointestinal: Negative for abdominal pain, no bowel changes.  Musculoskeletal: Negative for gait problem or joint swelling.  Skin: Negative for rash.  Neurological: Negative for dizziness or headache.    No other specific complaints in a complete review of systems (except as listed in HPI above).  Objective  Vitals:   08/09/17 0917 08/09/17 0941  BP: 110/76   Pulse: (!) 110 100  Resp: 16   Temp: 98.3 F (36.8 C)   TempSrc: Oral   SpO2: 98%   Weight: 155 lb 6.4 oz (70.5 kg)   Height: 5' 2" (1.575 m)    Body mass index is 28.42 kg/m.  Nursing Note and Vital Signs reviewed.  Physical Exam Constitutional: Patient appears well-developed and well-nourished. No  distress.  HEENT: head atraumatic, normocephalic Cardiovascular: Normal rate, regular rhythm, S1/S2 present.  No murmur or rub heard. No BLE edema. Pulmonary/Chest: Effort normal and breath sounds clear. No respiratory distress or retractions. Abdominal: Soft and generalized tenderness, bowel sounds present x4 quadrants. Psychiatric: Patient has a normal mood and affect. behavior is normal. Judgment and thought content normal.  Recent Results (from the past 2160 hour(s))  COMPLETE METABOLIC PANEL WITH GFR     Status: Abnormal   Collection Time: 05/24/17  9:12 AM  Result Value Ref Range   Sodium 141 135 - 146 mmol/L   Potassium 4.1 3.5 - 5.3 mmol/L   Chloride 105 98 - 110 mmol/L   CO2 23 20 - 31 mmol/L   Glucose, Bld 93 65 - 99 mg/dL   BUN 9 7 - 25 mg/dL   Creat 0.61 0.50 - 1.10 mg/dL   Total Bilirubin 0.5 0.2 - 1.2 mg/dL   Alkaline Phosphatase 30 (L) 33 - 115 U/L   AST 10 10 - 35 U/L   ALT 7 6 - 29 U/L   Total Protein 6.9 6.1 - 8.1 g/dL   Albumin 4.2 3.6 - 5.1 g/dL   Calcium 9.0 8.6 - 10.2 mg/dL   GFR, Est African American >89 >=60 mL/min   GFR, Est Non African American >89 >=60 mL/min  CBC with Differential/Platelet     Status: Abnormal   Collection Time: 05/24/17  9:12 AM  Result Value Ref Range   WBC 3.1 (L) 3.8 - 10.8 K/uL   RBC 4.64 3.80 - 5.10 MIL/uL   Hemoglobin 12.7 11.7 - 15.5 g/dL   HCT 38.8 35.0 - 45.0 %   MCV 83.6 80.0 - 100.0 fL   MCH 27.4 27.0 - 33.0 pg   MCHC 32.7 32.0 - 36.0 g/dL   RDW 13.2 11.0 - 15.0 %   Platelets 248 140 - 400 K/uL   MPV 10.5 7.5 - 12.5 fL   Neutro Abs 1,178 (L) 1,500 - 7,800 cells/uL   Lymphs Abs 1,488 850 - 3,900 cells/uL   Monocytes Absolute 310 200 - 950 cells/uL   Eosinophils Absolute 124 15 - 500 cells/uL   Basophils Absolute 0 0 - 200 cells/uL   Neutrophils Relative % 38 %   Lymphocytes Relative 48 %   Monocytes Relative 10 %   Eosinophils Relative 4 %   Basophils Relative 0 %   Smear Review Criteria for review not met    Insulin, fasting     Status: None   Collection Time: 05/24/17  9:12 AM  Result Value Ref Range   Insulin fasting, serum 6.7 2.0 - 19.6 uIU/mL    Comment:   This insulin assay shows strong cross-reactivity for some insulin analogs (lispro, aspart, and glargine) and much lower cross-reactivity with others (detemir, glulisine).   Stimulated Insulin reference intervals were established using the Siemens Immulite assay. These values are provided for general guidance only.     Hemoglobin A1c     Status: None   Collection Time: 05/24/17  9:12 AM  Result Value Ref Range   Hgb A1c MFr Bld 5.4 <5.7 %    Comment:   For the purpose of screening for the presence of diabetes:   <5.7%       Consistent with the absence of diabetes 5.7-6.4 %   Consistent with increased risk for diabetes (prediabetes) >=6.5 %     Consistent with diabetes   This assay result is consistent with a decreased risk of diabetes.   Currently, no consensus exists regarding use of hemoglobin A1c for diagnosis of diabetes in children.   According to American Diabetes Association (ADA) guidelines, hemoglobin A1c <7.0% represents optimal control in non-pregnant diabetic patients. Different metrics may apply to specific patient populations. Standards of Medical Care in Diabetes (ADA).      Mean Plasma Glucose 108 mg/dL  HIV antibody     Status: None   Collection Time: 05/24/17  9:12 AM  Result Value Ref Range   HIV 1&2 Ab, 4th Generation NONREACTIVE NONREACTIVE    Comment:   HIV-1 antigen and HIV-1/HIV-2 antibodies were not detected.  There is no laboratory evidence of HIV infection.   HIV-1/2 Antibody Diff        Not indicated. HIV-1 RNA, Qual TMA          Not indicated.     PLEASE NOTE: This information has been disclosed to you from records whose confidentiality may be protected by state law. If your state requires such protection, then the state law prohibits you from making any further disclosure of the  information without the specific written consent of the person to whom it pertains, or as otherwise permitted by law. A general authorization for the release of medical or other information is NOT sufficient for this purpose.   The performance of this assay has not been clinically validated in patients less than 73 years old.   For additional information please refer to http://education.questdiagnostics.com/faq/FAQ106.  (This link is being provided for informational/educational purposes only.)     Lipid panel     Status: Abnormal   Collection Time: 05/24/17  9:12 AM  Result Value Ref Range   Cholesterol 182 <200 mg/dL   Triglycerides 57 <150 mg/dL   HDL 61 >50 mg/dL   Total CHOL/HDL Ratio 3.0 <5.0 Ratio   VLDL 11 <30 mg/dL   LDL Cholesterol 110 (H) <100 mg/dL  VITAMIN D 25 Hydroxy (Vit-D Deficiency, Fractures)     Status: Abnormal   Collection Time: 05/24/17  9:12 AM  Result Value Ref Range   Vit D, 25-Hydroxy 11 (L) 30 - 100 ng/mL    Comment: Vitamin D Status           25-OH Vitamin D        Deficiency                <20 ng/mL        Insufficiency         20 - 29 ng/mL        Optimal             > or = 30 ng/mL   For 25-OH Vitamin D testing on patients on D2-supplementation and patients for whom quantitation of D2 and D3 fractions is required, the [facility] 25-OH VIT D, (D2,D3), LC/MS/MS is recommended: order code [id number] (patients > 2 yrs).   TSH     Status: None   Collection Time:  05/24/17  9:12 AM  Result Value Ref Range   TSH 0.96 mIU/L    Comment:   Reference Range   > or = 20 Years  0.40-4.50   Pregnancy Range First trimester  0.26-2.66 Second trimester 0.55-2.73 Third trimester  0.43-2.91     Pap IG w/ reflex to HPV when ASC-U     Status: None   Collection Time: 05/24/17 12:17 PM  Result Value Ref Range   Specimen adequacy:      Comment: SATISFACTORY.  Endocervical/transformation zone component present. The specimen is partially obscured by blood.     FINAL DIAGNOSIS:      Comment: - NEGATIVE FOR INTRAEPITHELIAL LESIONS OR MALIGNANCY.    COMMENTS:      Comment: LMP (Last Menstrual Period) of the patient is not given. This Pap test has been evaluated with computer assisted technology.    Cytotechnologist:      Comment: JLT, BS CT(ASCP) *  The Pap is a screening test for cervical cancer. It is not a  diagnostic test and is subject to false negative and false positive  results. It is most reliable when a satisfactory sample, regularly  obtained, is submitted with relevant clinical findings and history,  and when the Pap result is evaluated along with historic and current  clinical information.      Assessment & Plan  1. Diarrhea of presumed infectious origin - BASIC METABOLIC PANEL WITH GFR - CBC w/Diff/Platelet - ciprofloxacin (CIPRO) 500 MG tablet; Take 1 tablet (500 mg total) by mouth 2 (two) times daily.  Dispense: 14 tablet; Refill: 0 - metroNIDAZOLE (FLAGYL) 500 MG tablet; Take 1 tablet (500 mg total) by mouth 3 (three) times daily.  Dispense: 21 tablet; Refill: 0  2. Nausea - ondansetron (ZOFRAN) 4 MG tablet; Take 1 tablet (4 mg total) by mouth every 8 (eight) hours as needed for nausea or vomiting.  Dispense: 20 tablet; Refill: 0  3. Generalized abdominal pain  - BASIC METABOLIC PANEL WITH GFR - CBC w/Diff/Platelet - dicyclomine (BENTYL) 10 MG capsule; Take 1 capsule (10 mg total) by mouth 3 (three) times daily as needed for spasms.  Dispense: 20 capsule; Refill: 0 - ciprofloxacin (CIPRO) 500 MG tablet; Take 1 tablet (500 mg total) by mouth 2 (two) times daily.  Dispense: 14 tablet; Refill: 0 - metroNIDAZOLE (FLAGYL) 500 MG tablet; Take 1 tablet (500 mg total) by mouth 3 (three) times daily.  Dispense: 21 tablet; Refill: 0  4. Diverticulosis of large intestine without hemorrhage  - Due to patient's history of diverticulosis and her chronic leukopenia from use of immunomodulator therapy for MS, we will treat for  presumed infectious diarrhea with cipro and flagyl as above.  -Red flags and when to present for emergency care or RTC including fever >101.4F, chest pain, shortness of breath, significant fatigue/malaise, severe abdominal pain, vomiting, severe diarrhea, inability to tolerate PO intake, new/worsening/un-resolving symptoms, reviewed with patient at time of visit. Follow up and care instructions discussed and provided in AVS.  I have reviewed this encounter including the documentation in this note and/or discussed this patient with the provider, , FNP, NP-C. I am certifying that I agree with the content of this note as supervising physician.  Krichna Sowles, MD [facility] 08/13/2017, 8:35 PM  

## 2017-08-10 ENCOUNTER — Telehealth: Payer: Self-pay | Admitting: Emergency Medicine

## 2017-08-10 NOTE — Telephone Encounter (Signed)
Patient called and stated she is not feeling any better. Was seen by Raquel Sarna NP on yesterday. Received a note to be out of work for 2 days and would like to know if this could be extended.  Spoke to Dr. Ancil Boozer , ok to give note for Thursday 9/13. Informed patient if things get worse to got to ER or call office back for appointment. Patient verbalized understanding

## 2017-08-15 ENCOUNTER — Inpatient Hospital Stay: Admission: RE | Admit: 2017-08-15 | Payer: Medicare Other | Source: Ambulatory Visit

## 2017-08-19 ENCOUNTER — Encounter: Admission: RE | Payer: Self-pay | Source: Ambulatory Visit

## 2017-08-19 ENCOUNTER — Ambulatory Visit: Admission: RE | Admit: 2017-08-19 | Payer: Medicare Other | Source: Ambulatory Visit | Admitting: Podiatry

## 2017-08-19 SURGERY — CORRECTION, HALLUX VALGUS
Anesthesia: Choice | Laterality: Right

## 2017-08-25 NOTE — Unmapped (Signed)
Patient was scheduled for clinical assessment and refill call today, however she is out of refills. Has not been seen in clinic for 1 year and most recent lab work I could find was from 2014. Has been on Copaxone since 2016.Sent message to [name] and Clara to determine if refill for 1 month can be given or if patient needs to be seen first.    Worthy Flank, PharmD  Clinical Pharmacist, [facility]  Phone: [phone]

## 2017-08-25 NOTE — Unmapped (Signed)
Patient is past due for clinic visit.Last seen 07/2016. On Copaxone, refill for one month.  Staff notified to call and schedule appointment.  My chart message and  letter sent to patient.

## 2017-08-26 MED ORDER — COPAXONE 40 MG/ML SUBCUTANEOUS SYRINGE
0 refills | 0 days
Start: 2017-08-26 — End: 2017-08-26

## 2017-08-26 MED ORDER — GLATIRAMER 40 MG/ML SUBCUTANEOUS SYRINGE
INJECTION | 0 refills | 0 days | Status: CP
Start: 2017-08-26 — End: 2017-09-20

## 2017-08-26 NOTE — Unmapped (Signed)
Specialty Pharmacy - Neurology Medication Clinical Assessment and Refill Coordination      Hannah Barr is a 46 y.o. female contacted today regarding refills of her specialty medication(s) glatiramer acetate (COPAXONE)      Reviewed and verified with patient: Allergies - Medications -      Specialty medication(s) and dose(s) confirmed: yes  Changes to medications: no  Changes to insurance: no    Patient reports new MS disease activity: no    Medication Adherence    Patient reported X missed doses in the last month:  0  Specialty Medication:  Copaxone  Patient is at risk for Non-Adherence:  No  Reasons for non-adherence:  no problems identified       Refill Coordination    Has the Patients' Contact Information Changed:  No  Is the Shipping Address Different:  No       Shipping Information    Delivery Scheduled:  Yes  Delivery Date:  08/31/17  Medications to be Shipped:  Copaxone - patient has 3 injections left.        Adverse Effects    *All other systems reviewed and are negative       Drug Interactions    Drug interactions evaluated:  yes  Clinically relevant drug interactions identified:  no  Provided the patient with educational material regarding drug interactions:  not applicable       Patient Counseling    Counseled the patient on the following:  lab monitoring and follow-up discussed, pharmacy contact information discussed  Other topic:  Patient states she is doing well on her Copaxone. No side effects to report. Also dicussed with her that since she hasn't been seen in a year, this will be last fill until she is seen in clinic. She made an appointment to be seen by Clara for next week on 10/4.           Worthy Flank, PharmD  Clinical Pharmacist, [facility]  Phone: [phone]

## 2017-08-29 MED FILL — COPAXONE/40MG/ML/SOSY: COPAXONE/40MG/ML/SOSY | 28 days supply | Qty: 12 | Fill #0

## 2017-09-01 DIAGNOSIS — H40003 Preglaucoma, unspecified, bilateral: Secondary | ICD-10-CM | POA: Diagnosis not present

## 2017-09-20 ENCOUNTER — Ambulatory Visit: Payer: Medicare Other | Admitting: Family Medicine

## 2017-09-20 ENCOUNTER — Ambulatory Visit
Admission: RE | Admit: 2017-09-20 | Discharge: 2017-09-20 | Disposition: A | Discharge status: Home / Self Care | Payer: MEDICARE

## 2017-09-20 DIAGNOSIS — G35 Multiple sclerosis: Secondary | ICD-10-CM | POA: Diagnosis not present

## 2017-09-20 MED ORDER — GABAPENTIN 300 MG CAPSULE
ORAL_CAPSULE | Freq: Three times a day (TID) | ORAL | 5 refills | 0 days | Status: CP
Start: 2017-09-20 — End: 2018-04-11

## 2017-09-20 MED ORDER — MODAFINIL 100 MG TABLET
ORAL_TABLET | 5 refills | 0 days | Status: CP
Start: 2017-09-20 — End: 2018-04-11

## 2017-09-20 MED ORDER — COPAXONE 40 MG/ML SUBCUTANEOUS SYRINGE
5 refills | 0 days
Start: 2017-09-20 — End: 2017-09-20

## 2017-09-20 MED ORDER — GLATIRAMER 40 MG/ML SUBCUTANEOUS SYRINGE
INJECTION | 5 refills | 0 days | Status: CP
Start: 2017-09-20 — End: 2018-04-11

## 2017-09-20 NOTE — Unmapped (Signed)
[facility] [employer] [facility] at [facility]  Multiple Sclerosis / Neuroimmunology Division  Slayton Lubitz [name] [facility]  Physician Assistant    Phone: [phone]  Fax: [phone]  ????  Patient Name: Hannah Barr   Date of Birth: [birth year]  Medical Record Number: [id number]  [address]  [place] [address]  ??  Direct entry by:  [name], PA-C.  Supervising Physician: Dr. [name].    DATE OF VISIT: September 20, 2017    REASON FOR VISIT: Followup in the Neuroimmunology Clinic for evaluation of relapsing remitting multiple sclerosis. Last seen 08/27/2016.    ASSESSMENT AND PLAN:   **  Relapsing Remitting Multiple Sclerosis:  -Refill  Copaxone, restarted 03/03/2015.     -check creatinine and Vit D 25-OH before MRI's.  -Schedule MRI of the brain, cervical and thoracic spine w /wo contrast with adult sedation. 03/29/2015 MRI of the thoracic spine showed a new enhancing lesion.  -DMV form filled out.    **  Vitamin D deficiency: Continue Vit D3 4,000 units daily.   ** Right sciatica: Start Gabapentin 300mg  TID for one week and then if needed increase to 600mg  TID. Refer to Physcial Therapy.  ** Depression: Recommend  local mental health for  Wellbutrin refills. PHQ9 = 7. Denies suicidal ideation.  ** Fatigue: Restart and Refill Provigil 100 mg twice daily.   ** MS hugs/ muscle spasms: Monitor symptoms and hold Baclofen.    - Return 6 months .  -Total visit time =  40    Minutes. 1112/ 1153  Greater than 50% of the face to face time was spent in consultation and treatment planning on the  disease process, medication, dosing and side effects. MRI's reviewed personally by myself and with patient.    INTERVAL HISTORY / CHIEF COMPLAINT:   Ran out of baclofen, Provigil and Gabapentin 6 months ago. Now have pain in right hip that radiates down to the right foot. It is constant.  Having numbness and tingling on the left side that comes and goes. Complaining of depression but no suicidal ideation.    Generalized not motor fatigue: Tried on 06/21/2012 Amantadine 100 mg b.i.d.  Was not effective and she was started on Provigil. Has noticed improvement with fatigue with Provigil.    Patient reports that they are taking DMT appropriately and is not experiencing any side effects.  Since the last visit patient states that they have not had any clinical flare-ups nor IV steroids.  Denies double vision, pain with eye movement or color desaturation.  Denies urinary leakage, urgency or inability to empty bladder.  Denies fatigue, pain , numbness or tingling.  Denies muscle spasms.  Patient confirms that they are taking Vit D3 supplement.  Patient reports mother in hospital.    PRIOR HISTORY: A 46 y.o.  African-American female who was diagnosed with relapsing remitting multiple sclerosis 02/11/2011. She was seen as a new patient on 02/10/2011 by Dr. Denice Bors. Initial symptoms began in 12/2010. This started abruptly with occipital headaches with occasional tension pain in her neck. Two days later, she started having discrimination to light touch along the posterior aspect of her left forearm that radiated into her left shoulder. Over the next several days, her symptoms worsened. She began having numbness in her left arm. She then went to the emergency room at [facility], had MRI of the brain and C-spine, which showed demyelinating abnormalities.     MRI REVIEW:  08/14/2016  MRI of the brain compared to 03/29/2015: Interval stability of the multiple T2/FLAIR hyperintense lesions are present within the periventricular and deep white matter, many of which are oriented perpendicular to the lateral ventricles, compatible with patient history of multiple sclerosis. Many of the periventricular lesions demonstrate T1 signal below that of normal appearing gray matter consistent with chronic plaque. There is callososeptal involvement and moderate volume loss of the posterior body and splenium of the corpus callosum. Mild prominence of the sulci is noted and greater than expected for patient age. 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 The optic nerves are symmetric without evidence of enlargement or T2/FLAIR signal abnormality.    08/14/2016 MRI of the cervical spine compared to 03/29/2015: There is redemonstration of abnormal T2 hyperintensity within the left posterior lateral medulla and cervical cord at the C2 level in the right anterior and lateral cord, in the left lateral cord at the C3 level, unchanged. No new lesion or abnormal enhancement is present.    08/14/2016 MRI of the thoracic spine compared to 03/29/2015: Redemonstration of the nonenhancing T2 hyperintense lesion at the T6 level, unchanged. The previously described T2 hyperintense lesion at T1 and T8-T9 at not again visualized.     03/29/2015 MRI of the thoracic spine showed a new enhancing lesion.    MS FLARE-UPS: Never received IVMP.    MS MEDICATION HISTORY: She was started on Copaxone approximately 02/11/2011 and remains on that today.     GYN HISTORY:  LNM 07/2016.    REVIEW OF SYSTEMS:   10 systems reviewed and otherwise negative except as noted in HPI.    Patient Active Problem List   Diagnosis   ??? Hypertension   ??? Multiple sclerosis (CMS-HCC)   ??? MS (multiple sclerosis) (CMS-HCC)   ??? Vitamin D deficiency disease     Current Outpatient Prescriptions   Medication Sig Dispense Refill   ??? gabapentin (NEURONTIN) 300 MG capsule Take 2 capsules (600 mg total) by mouth Three (3) times a day. Frequency:TID 180 capsule 5   ??? glatiramer (COPAXONE) 40 mg/mL Syrg Inject 40 mg total under the skin every Monday, Wednesday, & Friday. Separate doses by at least 48 hours. 12 Syringe 5   ??? linaclotide (LINZESS) 145 mcg capsule Take 145 mcg by mouth.     ??? mesalamine (CANASA) 1000 MG suppository Insert 1,000 mg into the rectum nightly.     ??? modafinil (PROVIGIL) 100 MG tablet Frequency:BID 60 tablet 5   ??? SUMAtriptan (IMITREX) 100 MG tablet Take 100 mg by mouth.     ??? cholecalciferol, vitamin D3, 4,000 unit Tab Take 4,000 Units by mouth.       No current facility-administered medications for this visit.          Allergies   Allergen Reactions   ??? Penicillins Hives     Social History     Social History   ??? Marital status: Single     Spouse name: N/A   ??? Number of children: N/A   ??? Years of education: N/A     Occupational History   ??? Not on file.     Social History Main Topics   ??? Smoking status: Never Smoker   ??? Smokeless tobacco: Never Used   ??? Alcohol use No   ??? Drug use: No   ??? Sexual activity: Not on file     Other Topics Concern   ??? Not on file     Social History Narrative  She lives by herself and 2 children, ages 83     and 29. She has not worked since 01/2011. She used to work as a     Interior and spatial designer. Substance abuse, denies tobacco, alcohol or illicit     drug use.                  VITAL SIGNS:  BP 145/82 (BP Site: L Arm, BP Position: Sitting, BP Cuff Size: Large)  - Pulse 75  - Ht 157.5 cm (5' 2)  - Wt 74.3 kg (163 lb 11.2 oz)  - BMI 29.94 kg/m??     Neurological Examination:   Alert, oriented to person, place and time. Speech with no dysarthria or aphasia.     Cranial Nerves:   II, III- Pupils are equal 2 mm and reactive to light b/l.  III, IV, VI- extra ocular movements are intact, No ptosis, no nystagmus.  V- sensation of the face decreased on the left in all 3 branches.   VII- face symmetrical, no facial droop, normal facial movements with smile/grimace  VIII- Hearing grossly intact.  IX and X- symmetric palate contraction, normal gag bilaterally  XI- Full shoulder shrug bilaterally  XII- Tongue protrudes midline, full range of movements of the tongue    Motor Exam:   ??  Muscles UEs ?? LEs   ?? R L ?? R L   Deltoids 5/5 5/5 Hip flexors  5/5 5/5   Biceps 5/5 5/5 Hip extensors 5/5 5/5   Triceps 5/5 5/5 Knee flexors 5/5 5/5   Hand grip 5/5 5/5 Knee extensors 5/5 5/5   Wrist flexors 5/5 5/5 Foot dorsal flexors 5/5 5/5   Wrist extensors 5/5 5/5 Foot plantar flexors 5/5 5/5   Finger flexors 5/5 5/5      Finger extensors 5/5 5/5         Normal bulk and tone.  No clonus.    Reflexes R L   Brachioradialis +2 +2   Biceps +2 +2   Triceps +2 +2   Patella +2 +2   Achilles +2 +2     Negative babinski.    Sensory UEs LEs    R L R L          Pin prick WNL Decreased but can tell sharp from dull. WNL WNL   Vibration WNL WNL WNL WNL   Proprioception WNL WNL WNL WNL     Cerebellar/Coordination:  Rapid alternating movements, finger-to-nose and heel-to-shin  bilaterally demonstrates no abnormalities.    Gait: Normal stride, base and arm swing. Able to tandem, heel, and toe gait without difficulty.

## 2017-09-20 NOTE — Unmapped (Signed)
??  ??     In case of:  ?? a suspected relapse (new symptoms or worsening existing symptoms, lasting for >24h)  OR  ?? a need for an additional appointment for other reasons     Please contact:    [facility] Neurology [facility] Desk  Phone: [phone]      OR     Ms. Webb Laws  Administrative [facility], Department of Neurology  [address], [id number]     [place], [place] [address]     Phone: [phone], Fax: [phone]           Yolande Jolly [facility]    [facility] Neurology /   Multiple Sclerosis Division    [address]    [place], [place] [address]

## 2017-09-21 ENCOUNTER — Encounter: Payer: Self-pay | Admitting: Family Medicine

## 2017-09-21 ENCOUNTER — Ambulatory Visit (INDEPENDENT_AMBULATORY_CARE_PROVIDER_SITE_OTHER): Payer: Medicare Other | Admitting: Family Medicine

## 2017-09-21 VITALS — BP 148/92 | HR 80 | Temp 98.0°F | Resp 16 | Ht 62.0 in | Wt 163.7 lb

## 2017-09-21 DIAGNOSIS — R079 Chest pain, unspecified: Secondary | ICD-10-CM | POA: Diagnosis not present

## 2017-09-21 DIAGNOSIS — R600 Localized edema: Secondary | ICD-10-CM | POA: Diagnosis not present

## 2017-09-21 DIAGNOSIS — G43009 Migraine without aura, not intractable, without status migrainosus: Secondary | ICD-10-CM

## 2017-09-21 DIAGNOSIS — F331 Major depressive disorder, recurrent, moderate: Secondary | ICD-10-CM

## 2017-09-21 DIAGNOSIS — I10 Essential (primary) hypertension: Secondary | ICD-10-CM | POA: Diagnosis not present

## 2017-09-21 DIAGNOSIS — M5431 Sciatica, right side: Secondary | ICD-10-CM | POA: Insufficient documentation

## 2017-09-21 LAB — COMPLETE METABOLIC PANEL WITH GFR
AG Ratio: 1.7 (calc) (ref 1.0–2.5)
ALT: 7 U/L (ref 6–29)
AST: 11 U/L (ref 10–35)
Albumin: 4.4 g/dL (ref 3.6–5.1)
Alkaline phosphatase (APISO): 35 U/L (ref 33–115)
BUN: 8 mg/dL (ref 7–25)
CO2: 31 mmol/L (ref 20–32)
Calcium: 9.3 mg/dL (ref 8.6–10.2)
Chloride: 103 mmol/L (ref 98–110)
Creat: 0.68 mg/dL (ref 0.50–1.10)
GFR, Est African American: 122 mL/min/{1.73_m2} (ref >=60)
GFR, Est Non African American: 106 mL/min/{1.73_m2} (ref >=60)
Globulin: 2.6 g/dL (calc) (ref 1.9–3.7)
Glucose, Bld: 88 mg/dL (ref 65–139)
Potassium: 4.1 mmol/L (ref 3.5–5.3)
Sodium: 140 mmol/L (ref 135–146)
Total Bilirubin: 0.4 mg/dL (ref 0.2–1.2)
Total Protein: 7 g/dL (ref 6.1–8.1)

## 2017-09-21 LAB — CBC WITH DIFFERENTIAL/PLATELET
Basophils Absolute: 19 cells/uL (ref 0–200)
Basophils Relative: 0.5 %
Eosinophils Absolute: 141 cells/uL (ref 15–500)
Eosinophils Relative: 3.7 %
HCT: 35.5 % (ref 35.0–45.0)
Hemoglobin: 12 g/dL (ref 11.7–15.5)
Lymphs Abs: 1615 cells/uL (ref 850–3900)
MCH: 27.7 pg (ref 27.0–33.0)
MCHC: 33.8 g/dL (ref 32.0–36.0)
MCV: 82 fL (ref 80.0–100.0)
MPV: 10.9 fL (ref 7.5–12.5)
Monocytes Relative: 9.5 %
Neutro Abs: 1664 cells/uL (ref 1500–7800)
Neutrophils Relative %: 43.8 %
Platelets: 285 10*3/uL (ref 140–400)
RBC: 4.33 10*6/uL (ref 3.80–5.10)
RDW: 12.4 % (ref 11.0–15.0)
Total Lymphocyte: 42.5 %
WBC mixed population: 361 cells/uL (ref 200–950)
WBC: 3.8 10*3/uL (ref 3.8–10.8)

## 2017-09-21 LAB — TSH: TSH: 0.55 mIU/L

## 2017-09-21 MED ORDER — AMITRIPTYLINE 25 MG TABLET
ORAL_TABLET | Freq: Every evening | ORAL | 2 refills | 0.00000 days | Status: CP | PRN
Start: 2017-09-21 — End: 2017-10-17

## 2017-09-21 MED ORDER — ESCITALOPRAM OXALATE 10 MG PO TABS: 10.0000 mg | ORAL_TABLET | Freq: Every day | ORAL | 0 refills | Status: DC

## 2017-09-21 MED ORDER — HYDROCHLOROTHIAZIDE 12.5 MG PO TABS: 12.5000 mg | ORAL_TABLET | Freq: Every day | ORAL | 3 refills | Status: DC

## 2017-09-21 NOTE — Unmapped (Signed)
Patient called and said that she is still taking amitriptyline and would like a new prescription for it.      I will work on modafinil tomorrow - she will need pa for this - do we have justification for twice a day?

## 2017-09-21 NOTE — Patient Instructions (Addendum)
Sciatica Rehab Ask your health care provider which exercises are safe for you. Do exercises exactly as told by your health care provider and adjust them as directed. It is normal to feel mild stretching, pulling, tightness, or discomfort as you do these exercises, but you should stop right away if you feel sudden pain or your pain gets worse.Do not begin these exercises until told by your health care provider. Stretching and range of motion exercises These exercises warm up your muscles and joints and improve the movement and flexibility of your hips and your back. These exercises also help to relieve pain, numbness, and tingling. Exercise A: Sciatic nerve glide 1. Sit in a chair with your head facing down toward your chest. Place your hands behind your back. Let your shoulders slump forward. 2. Slowly straighten one of your knees while you tilt your head back as if you are looking toward the ceiling. Only straighten your leg as far as you can without making your symptoms worse. 3. Hold for __________ seconds. 4. Slowly return to the starting position. 5. Repeat with your other leg. Repeat __________ times. Complete this exercise __________ times a day. Exercise B: Knee to chest with hip adduction and internal rotation  1. Lie on your back on a firm surface with both legs straight. 2. Bend one of your knees and move it up toward your chest until you feel a gentle stretch in your lower back and buttock. Then, move your knee toward the shoulder that is on the opposite side from your leg. ? Hold your leg in this position by holding onto the front of your knee. 3. Hold for __________ seconds. 4. Slowly return to the starting position. 5. Repeat with your other leg. Repeat __________ times. Complete this exercise __________ times a day. Exercise C: Prone extension on elbows  1. Lie on your abdomen on a firm surface. A bed may be too soft for this exercise. 2. Prop yourself up on your  elbows. 3. Use your arms to help lift your chest up until you feel a gentle stretch in your abdomen and your lower back. ? This will place some of your body weight on your elbows. If this is uncomfortable, try stacking pillows under your chest. ? Your hips should stay down, against the surface that you are lying on. Keep your hip and back muscles relaxed. 4. Hold for __________ seconds. 5. Slowly relax your upper body and return to the starting position. Repeat __________ times. Complete this exercise __________ times a day. Strengthening exercises These exercises build strength and endurance in your back. Endurance is the ability to use your muscles for a long time, even after they get tired. Exercise D: Pelvic tilt 1. Lie on your back on a firm surface. Bend your knees and keep your feet flat. 2. Tense your abdominal muscles. Tip your pelvis up toward the ceiling and flatten your lower back into the floor. ? To help with this exercise, you may place a small towel under your lower back and try to push your back into the towel. 3. Hold for __________ seconds. 4. Let your muscles relax completely before you repeat this exercise. Repeat __________ times. Complete this exercise __________ times a day. Exercise E: Alternating arm and leg raises  1. Get on your hands and knees on a firm surface. If you are on a hard floor, you may want to use padding to cushion your knees, such as an exercise mat. 2. Line up your arms  and legs. Your hands should be below your shoulders, and your knees should be below your hips. 3. Lift your left leg behind you. At the same time, raise your right arm and straighten it in front of you. ? Do not lift your leg higher than your hip. ? Do not lift your arm higher than your shoulder. ? Keep your abdominal and back muscles tight. ? Keep your hips facing the ground. ? Do not arch your back. ? Keep your balance carefully, and do not hold your breath. 4. Hold for  __________ seconds. 5. Slowly return to the starting position and repeat with your right leg and your left arm. Repeat __________ times. Complete this exercise __________ times a day. Posture and body mechanics  Body mechanics refers to the movements and positions of your body while you do your daily activities. Posture is part of body mechanics. Good posture and healthy body mechanics can help to relieve stress in your body's tissues and joints. Good posture means that your spine is in its natural S-curve position (your spine is neutral), your shoulders are pulled back slightly, and your head is not tipped forward. The following are general guidelines for applying improved posture and body mechanics to your everyday activities. Standing   When standing, keep your spine neutral and your feet about hip-width apart. Keep a slight bend in your knees. Your ears, shoulders, and hips should line up.  When you do a task in which you stand in one place for a long time, place one foot up on a stable object that is 2-4 inches (5-10 cm) high, such as a footstool. This helps keep your spine neutral. Sitting   When sitting, keep your spine neutral and keep your feet flat on the floor. Use a footrest, if necessary, and keep your thighs parallel to the floor. Avoid rounding your shoulders, and avoid tilting your head forward.  When working at a desk or a computer, keep your desk at a height where your hands are slightly lower than your elbows. Slide your chair under your desk so you are close enough to maintain good posture.  When working at a computer, place your monitor at a height where you are looking straight ahead and you do not have to tilt your head forward or downward to look at the screen. Resting   When lying down and resting, avoid positions that are most painful for you.  If you have pain with activities such as sitting, bending, stooping, or squatting (flexion-based activities), lie in a  position in which your body does not bend very much. For example, avoid curling up on your side with your arms and knees near your chest (fetal position).  If you have pain with activities such as standing for a long time or reaching with your arms (extension-based activities), lie with your spine in a neutral position and bend your knees slightly. Try the following positions: ? Lying on your side with a pillow between your knees. ? Lying on your back with a pillow under your knees. Lifting   When lifting objects, keep your feet at least shoulder-width apart and tighten your abdominal muscles.  Bend your knees and hips and keep your spine neutral. It is important to lift using the strength of your legs, not your back. Do not lock your knees straight out.  Always ask for help to lift heavy or awkward objects. This information is not intended to replace advice given to you by your health care  provider. Make sure you discuss any questions you have with your health care provider. Document Released: 11/15/2005 Document Revised: 07/22/2016 Document Reviewed: 08/01/2015 Elsevier Interactive Patient Education  2018 [place].  Nonspecific Chest Pain Chest pain can be caused by many different conditions. There is a chance that your pain could be related to something serious, such as a heart attack or a blood clot in your lungs. Chest pain can also be caused by conditions that are not life-threatening. If you have chest pain, it is very important to follow up with your doctor. Follow these instructions at home: Medicines  If you were prescribed an antibiotic medicine, take it as told by your doctor. Do not stop taking the antibiotic even if you start to feel better.  Take over-the-counter and prescription medicines only as told by your doctor. Lifestyle  Do not use any products that contain nicotine or tobacco, such as cigarettes and e-cigarettes. If you need help quitting, ask your  doctor.  Do not drink alcohol.  Make lifestyle changes as told by your doctor. These may include: ? Getting regular exercise. Ask your doctor for some activities that are safe for you. ? Eating a heart-healthy diet. A diet specialist (dietitian) can help you to learn healthy eating options. ? Staying at a healthy weight. ? Managing diabetes, if needed. ? Lowering your stress, as with deep breathing or spending time in nature. General instructions  Avoid any activities that make you feel chest pain.  If your chest pain is because of heartburn: ? Raise (elevate) the head of your bed about 6 inches (15 cm). You can do this by putting blocks under the bed legs at the head of the bed. ? Do not sleep with extra pillows under your head. That does not help heartburn.  Keep all follow-up visits as told by your doctor. This is important. This includes any further testing if your chest pain does not go away. Contact a doctor if:  Your chest pain does not go away.  You have a rash with blisters on your chest.  You have a fever.  You have chills. Get help right away if:  Your chest pain is worse.  You have a cough that gets worse, or you cough up blood.  You have very bad (severe) pain in your belly (abdomen).  You are very weak.  You pass out (faint).  You have either of these for no clear reason: ? Sudden chest discomfort. ? Sudden discomfort in your arms, back, neck, or jaw.  You have shortness of breath at any time.  You suddenly start to sweat, or your skin gets clammy.  You feel sick to your stomach (nauseous).  You throw up (vomit).  You suddenly feel light-headed or dizzy.  Your heart starts to beat fast, or it feels like it is skipping beats. These symptoms may be an emergency. Do not wait to see if the symptoms will go away. Get medical help right away. Call your local emergency services (911 in the [place] Do not drive yourself to the hospital. This information is  not intended to replace advice given to you by your health care provider. Make sure you discuss any questions you have with your health care provider. Document Released: 05/03/2008 Document Revised: 08/09/2016 Document Reviewed: 08/09/2016 Elsevier Interactive Patient Education  2017 [employer].

## 2017-09-21 NOTE — Progress Notes (Addendum)
Name: Tammie Lopez   MRN: [id number]    DOB: 10-27-1971   Date:09/21/2017       Progress Note  Subjective  Chief Complaint  Chief Complaint  Patient presents with  . Hypertension    was seen at Neurology on yesterday it was 150/90  . Headache    for 1 week  . Back Pain    HPI  Headache/HTN/Chest Pain - Patient c/o ongoing headache for 1-[redacted] weeks along with increased BP readings. She is checking BP at home and has been getting 140-150's/90's over the last two weeks.  Last week when her head hurt, she experienced chest pain and shortness of breath intermittently for a few seconds at a time - not having these symptoms this week. She also notes mild shortness of breath with exertion.  She does endorse small amount of fluid around her ankles at the end of the day - goes away with elevation. +Phonophobia and Photophobia.  No NVD, abdominal pain, vision changes, weakness, facial droop, confusion, or speech changes. - She did not take Imitrex for her headache "because it's not that type of headache".  Has not tried any other medications. - She has MS - saw Neurology yesterday and was told to see PCP for BP management.  No longer taking Allegra or Flonase.  She was on BP medication in the past - was taken off when she lost weight and BP came down, she has since gained the weight back.  BP at Neurology yesterday was 145/82; BP today is 148/92.   Sleep Issues/Increased Stress: She notes early morning awakenings - able to fall asleep, then awakes around 2-3am and cannot go back to sleep. PHQ-9 Score is 13 today.  She is under increased stress as her mother was in the hospital.  She also notes that she hates her job. She has history of depression, and is willing to go back on medication if needed.    RIGHT Low Back Pain: RIGHT low back pain with radiation into the RLE x6 months.  No position makes pain better. No injury or overuse.  Pain is constant and sharp/radiating.  She has not tried any medications  for the pain.  She is getting set up with PT for this issue through her neurologist.  Patient Active Problem List   Diagnosis Date Noted  . Essential hypertension 09/21/2017  . Right sided sciatica 09/21/2017  . Major depression, recurrent ([place]) 05/24/2017  . Migraine without aura, not intractable 12/09/2016  . Obese 12/09/2016  . Nickel allergy 12/09/2016  . GERD without esophagitis 12/09/2016  . Leukopenia 12/09/2016  . Dyslipidemia 12/09/2016  . Diverticulosis 12/09/2016  . 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 Major depression in remission ([place]) 12/09/2016  . Allergic rhinitis, seasonal 12/09/2016  . Chronic constipation 12/09/2016  . Vitamin D deficiency disease 08/06/2013  . MS (multiple sclerosis) ([place]) 04/17/2013  . History of hypertension 06/21/2012    Social History  Substance Use Topics  . Smoking status: Never Smoker  . Smokeless tobacco: Never Used  . Alcohol use No     Current Outpatient Prescriptions:  .  dicyclomine (BENTYL) 10 MG capsule, Take 1 capsule (10 mg total) by mouth 3 (three) times daily as needed for spasms., Disp: 20 capsule, Rfl: 0 .  Dulaglutide (TRULICITY) 1.5 DZ/3.2DJ SOPN, Inject 1.5 mg into the skin once a week., Disp: 4 pen, Rfl: 2 .  gabapentin (NEURONTIN) 300 MG capsule, Take 600 mg by mouth 3 (three) times daily., Disp: , Rfl:  .  Glatiramer Acetate (COPAXONE) 40 MG/ML SOSY, Inject 40 mg into the skin 3 (three) times a week., Disp: , Rfl:  .  linaclotide (LINZESS) 145 MCG CAPS capsule, Take 1 capsule (145 mcg total) by mouth daily., Disp: 30 capsule, Rfl: 5 .  modafinil (PROVIGIL) 100 MG tablet, Take 1 tablet by mouth daily., Disp: , Rfl:  .  ondansetron (ZOFRAN) 4 MG tablet, Take 1 tablet (4 mg total) by mouth every 8 (eight) hours as needed for nausea or vomiting., Disp: 20 tablet, Rfl: 0 .  SUMAtriptan (IMITREX) 100 MG tablet, Take 1 tablet (100 mg total) by mouth every 2 (two) hours as needed for migraine. May repeat in 2 hours if headache persists  or recurs., Disp: 9 tablet, Rfl: 1 .  escitalopram (LEXAPRO) 10 MG tablet, Take 1 tablet (10 mg total) by mouth daily., Disp: 30 tablet, Rfl: 0 .  hydrochlorothiazide (HYDRODIURIL) 12.5 MG tablet, Take 1 tablet (12.5 mg total) by mouth daily., Disp: 90 tablet, Rfl: 3  Allergies  Allergen Reactions  . Penicillins Hives    ROS  Ten systems reviewed and is negative except as mentioned in HPI  Objective  Vitals:   09/21/17 0957  BP: (!) 148/92  Pulse: 80  Resp: 16  Temp: 98 F (36.7 C)  TempSrc: Oral  SpO2: 98%  Weight: 163 lb 11.2 oz (74.3 kg)  Height: 5\' 2"  (1.575 m)    Body mass index is 29.94 kg/m.  Nursing Note and Vital Signs reviewed.  Physical Exam  Constitutional: Patient appears well-developed and well-nourished. Obese. No distress.  HEENT: head atraumatic, normocephalic, pupils equal and reactive to light, EOM's intact. No thyromegaly.  Cardiovascular: Normal rate, regular rhythm, S1/S2 present.  No murmur or rub heard. No BLE edema on exam. Pulmonary/Chest: Effort normal and breath sounds clear. No respiratory distress or retractions. Psychiatric: Patient has a normal mood and slightly flattened affect. Behavior is normal. Judgment and thought content normal. MSK: Limited AROM of spine, no bony tenderness, limited AROM to RIGHT hip secondary to pain, full AROM of LLE.  Good strength bilaterally; sensation intact and normal popliteal fossas bilaterally  Recent Results (from the past 2160 hour(s))  BASIC METABOLIC PANEL WITH GFR     Status: Abnormal   Collection Time: 08/09/17 10:05 AM  Result Value Ref Range   Glucose, Bld 102 (H) 65 - 99 mg/dL    Comment: .            Fasting reference interval . For someone without known diabetes, a glucose value between 100 and 125 mg/dL is consistent with prediabetes and should be confirmed with a follow-up test. .    BUN 11 7 - 25 mg/dL   Creat 0.66 0.50 - 1.10 mg/dL   GFR, Est Non African American 107 > OR = 60  mL/min/1.33m2   GFR, Est African American 124 > OR = 60 mL/min/1.33m2   BUN/Creatinine Ratio NOT APPLICABLE 6 - 22 (calc)   Sodium 138 135 - 146 mmol/L   Potassium 4.2 3.5 - 5.3 mmol/L   Chloride 106 98 - 110 mmol/L   CO2 25 20 - 32 mmol/L   Calcium 9.5 8.6 - 10.2 mg/dL  CBC w/Diff/Platelet     Status: None   Collection Time: 08/09/17 10:05 AM  Result Value Ref Range   WBC 5.9 3.8 - 10.8 Thousand/uL   RBC 4.95 3.80 - 5.10 Million/uL   Hemoglobin 13.7 11.7 - 15.5 g/dL   HCT 39.9 35.0 - 45.0 %  MCV 80.6 80.0 - 100.0 fL   MCH 27.7 27.0 - 33.0 pg   MCHC 34.3 32.0 - 36.0 g/dL   RDW 12.7 11.0 - 15.0 %   Platelets 269 140 - 400 Thousand/uL   MPV 11.2 7.5 - 12.5 fL   Neutro Abs 3,983 1,500 - 7,800 cells/uL   Lymphs Abs 1,251 850 - 3,900 cells/uL   WBC mixed population 484 200 - 950 cells/uL   Eosinophils Absolute 171 15 - 500 cells/uL   Basophils Absolute 12 0 - 200 cells/uL   Neutrophils Relative % 67.5 %   Total Lymphocyte 21.2 %   Monocytes Relative 8.2 %   Eosinophils Relative 2.9 %   Basophils Relative 0.2 %     Assessment & Plan  1. Migraine without aura and without status migrainosus, not intractable Advised Tylenol or Sumatriptan for headache along with decreased stimulation - low lights, low noise.  Ice packs on head.  Start HCTZ for HTN management.  We will avoid NSAIDS due to elevated BP.  2. Essential hypertension - hydrochlorothiazide (HYDRODIURIL) 12.5 MG tablet; Take 1 tablet (12.5 mg total) by mouth daily.  Dispense: 90 tablet; Refill: 3 - TSH - Urine Microalbumin w/creat. ratio  3. Moderate episode of recurrent major depressive disorder (HCC) - escitalopram (LEXAPRO) 10 MG tablet; Take 1 tablet (10 mg total) by mouth daily.  Dispense: 30 tablet; Refill: 0  4. Bilateral lower extremity edema - COMPLETE METABOLIC PANEL WITH GFR  5. Chest pain, unspecified type - EKG 12-Lead - Sinus rhythm with T-wave changes. Patient is not fasting today, so we will not  check lipids.  However, we will refer to cardiology for possible stress test and additional evaluation. - COMPLETE METABOLIC PANEL WITH GFR - CBC w/Diff/Platelet - Ambulatory referral to Cardiology - TSH  6. Right sided sciatica Stretches per AVS, start Physical Therapy. Tylenol PRN for pain   -Red flags and when to present for emergency care or RTC including fever >101.73F, chest pain, shortness of breath, new/worsening/un-resolving symptoms, reviewed with patient at time of visit. Follow up and care instructions discussed and provided in AVS. Return in about 2 weeks (around 10/05/2017) for Follow up with Dr. Ancil Boozer.

## 2017-09-23 ENCOUNTER — Encounter: Payer: Self-pay | Admitting: Family Medicine

## 2017-09-23 ENCOUNTER — Ambulatory Visit (INDEPENDENT_AMBULATORY_CARE_PROVIDER_SITE_OTHER): Payer: Medicare Other | Admitting: Family Medicine

## 2017-09-23 VITALS — BP 150/98 | HR 86 | Resp 14 | Ht 62.0 in | Wt 162.1 lb

## 2017-09-23 DIAGNOSIS — I10 Essential (primary) hypertension: Secondary | ICD-10-CM | POA: Diagnosis not present

## 2017-09-23 DIAGNOSIS — G35 Multiple sclerosis: Secondary | ICD-10-CM

## 2017-09-23 DIAGNOSIS — R519 Headache, unspecified: Secondary | ICD-10-CM

## 2017-09-23 DIAGNOSIS — G4709 Other insomnia: Secondary | ICD-10-CM | POA: Diagnosis not present

## 2017-09-23 DIAGNOSIS — R51 Headache: Secondary | ICD-10-CM | POA: Diagnosis not present

## 2017-09-23 DIAGNOSIS — Z566 Other physical and mental strain related to work: Secondary | ICD-10-CM | POA: Diagnosis not present

## 2017-09-23 MED ORDER — AMLODIPINE BESYLATE 2.5 MG PO TABS: 2.5000 mg | ORAL_TABLET | Freq: Every day | ORAL | 0 refills | Status: DC

## 2017-09-23 MED ORDER — TRAZODONE HCL 50 MG PO TABS: 25.0000 mg | ORAL_TABLET | Freq: Every evening | ORAL | 3 refills | Status: DC | PRN

## 2017-09-23 MED ORDER — HYDROCHLOROTHIAZIDE 12.5 MG PO TABS: 25.0000 mg | ORAL_TABLET | Freq: Every day | ORAL | 0 refills | Status: DC

## 2017-09-23 NOTE — Unmapped (Signed)
[facility] Refill Coordination Note  Specialty Medication(s): COPAXONE      Hannah Barr, DOB: Apr 11, 1971  Phone: [phone] (home) , Alternate phone contact: N/A  Phone or address changes today?: No  All above HIPAA information was verified with patient.  Shipping Address: [address]  [place] [address]   Insurance changes? No    Completed refill call assessment today to schedule patient's medication shipment from the [facility] [phone]).      Confirmed the medication and dosage are correct and have not changed: Yes, regimen is correct and unchanged.    Confirmed patient started or stopped the following medications in the past month:  No, there are no changes reported at this time.    Are you tolerating your medication?:  Hannah Barr reports tolerating the medication.    ADHERENCE    (Below is required for Medicare Part B or Transplant patients only - per drug):   How many tablets were dispensed last month: 12  Patient currently has 7 remaining.    Did you miss any doses in the past 4 weeks? No missed doses reported.    FINANCIAL/SHIPPING    Delivery Scheduled: Yes, Expected medication delivery date: 10/05/17     Hannah Barr did not have any additional questions at this time.    Delivery address validated in [facility] scheduling system: Yes, address listed in [facility] is correct.    We will follow up with patient monthly for standard refill processing and delivery.      Thank you,  Westley Gambles   [facility] Specialty Technician

## 2017-09-23 NOTE — Progress Notes (Signed)
Name: Tammie Lopez   MRN: [id number]    DOB: 04-Oct-1971   Date:09/23/2017       Progress Note  Subjective  Chief Complaint  Chief Complaint  Patient presents with  . Migraine  . Hypertension    HPI  Uncontrolled HTN: she states bp has been elevated for the past 2 weeks and is associated with headache ( not like her typical migraine), it is throbbing, when very intense she has nausea, no vomiting, no focal findings. ( only neuro problems is sciatica symptoms going on for the past 6 months). She was feeling dizzy but that has resolved. Seen by Raelyn Ensign two days ago and started on HCTZ but only took first dose yesterday morning. 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 She just resumed Lexapro two days ago. She has been unable to sleep, did not like taking Elavil given by neurologist and stopped medication on her own. Sleeping at most 3 hours per night   Patient Active Problem List   Diagnosis Date Noted  . Essential hypertension 09/21/2017  . Right sided sciatica 09/21/2017  . Major depression, recurrent ([place]) 05/24/2017  . Migraine without aura, not intractable 12/09/2016  . Obese 12/09/2016  . Nickel allergy 12/09/2016  . GERD without esophagitis 12/09/2016  . Leukopenia 12/09/2016  . Dyslipidemia 12/09/2016  . Diverticulosis 12/09/2016  . 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 Major depression in remission ([place]) 12/09/2016  . Allergic rhinitis, seasonal 12/09/2016  . Chronic constipation 12/09/2016  . Vitamin D deficiency disease 08/06/2013  . MS (multiple sclerosis) ([place]) 04/17/2013  . History of hypertension 06/21/2012    Past Surgical History:  Procedure Laterality Date  . BUNIONECTOMY    . CESAREAN SECTION N/A 2006  . GANGLION CYST EXCISION Bilateral    Hands    Family History  Problem Relation Age of Onset  . Diabetes Mother   . Hypertension Mother   . Cancer Father 75       Mouth  . COPD Father   .  Hypertension Brother   . Diabetes Brother     Social History   Social History  . Marital status: Single    Spouse name: N/A  . Number of children: N/A  . Years of education: N/A   Occupational History  . Not on file.   Social History Main Topics  . Smoking status: Never Smoker  . Smokeless tobacco: Never Used  . Alcohol use No  . Drug use: No  . Sexual activity: Not Currently    Partners: Male   Other Topics Concern  . 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  dicyclomine (BENTYL) 10 MG capsule, Take 1 capsule (10 mg total) by mouth 3 (three) times daily as needed for spasms., Disp: 20 capsule, Rfl: 0 .  Dulaglutide (TRULICITY) 1.5 WU/9.8JX SOPN, Inject 1.5 mg into the skin once a week., Disp: 4 pen, Rfl: 2 .  escitalopram (LEXAPRO) 10 MG tablet, Take 1 tablet (10 mg total) by mouth daily., Disp: 30 tablet, Rfl: 0 .  gabapentin (NEURONTIN) 300 MG capsule, Take 600 mg by mouth 3 (three) times daily., Disp: , Rfl:  .  Glatiramer Acetate (COPAXONE) 40 MG/ML SOSY, Inject 40 mg into the skin 3 (three)  times a week., Disp: , Rfl:  .  hydrochlorothiazide (HYDRODIURIL) 12.5 MG tablet, Take 2 tablets (25 mg total) by mouth daily., Disp: 30 tablet, Rfl: 0 .  linaclotide (LINZESS) 145 MCG CAPS capsule, Take 1 capsule (145 mcg total) by mouth daily., Disp: 30 capsule, Rfl: 5 .  modafinil (PROVIGIL) 100 MG tablet, Take 1 tablet by mouth daily., Disp: , Rfl:  .  ondansetron (ZOFRAN) 4 MG tablet, Take 1 tablet (4 mg total) by mouth every 8 (eight) hours as needed for nausea or vomiting., Disp: 20 tablet, Rfl: 0 .  SUMAtriptan (IMITREX) 100 MG tablet, Take 1 tablet (100 mg total) by mouth every 2 (two) hours as needed for migraine. May repeat in 2 hours if headache persists or recurs., Disp: 9 tablet, Rfl: 1 .  amLODipine (NORVASC) 2.5 MG tablet, Take  1 tablet (2.5 mg total) by mouth daily., Disp: 30 tablet, Rfl: 0 .  traZODone (DESYREL) 50 MG tablet, Take 0.5-1 tablets (25-50 mg total) by mouth at bedtime as needed for sleep., Disp: 30 tablet, Rfl: 3  Allergies  Allergen Reactions  . Penicillins Hives     ROS  Constitutional: Negative for fever or weight change.  Respiratory: Negative for cough and shortness of breath.   Cardiovascular: Negative for chest pain or palpitations.  Gastrointestinal: Negative for abdominal pain, no bowel changes.  Musculoskeletal: Negative for gait problem or joint swelling.  Skin: Negative for rash.  Neurological: Negative for dizziness, positive for  headache.  No other specific complaints in a complete review of systems (except as listed in HPI above).  Objective  Vitals:   09/23/17 1128 09/23/17 1151  BP: (!) 160/120 (!) 150/98  Pulse: 86   Resp: 14   SpO2: 97%   Weight: 162 lb 1.6 oz (73.5 kg)   Height: 5\' 2"  (1.575 m)     Body mass index is 29.65 kg/m.  Physical Exam  Constitutional: Patient appears well-developed and well-nourished. Overweight No distress.  HEENT: head atraumatic, normocephalic, pupils equal and reactive to light,  neck supple, throat within normal limits Cardiovascular: Normal rate, regular rhythm and normal heart sounds.  No murmur heard. No BLE edema. Pulmonary/Chest: Effort normal and breath sounds normal. No respiratory distress. Abdominal: Soft.  There is no tenderness. Psychiatric: Patient has a normal mood and affect. behavior is normal. Judgment and thought content normal. Neurological : no focal findings, romberg negative.Stable paresthesia left side from MS. Cranial nerve intake  Recent Results (from the past 2160 hour(s))  BASIC METABOLIC PANEL WITH GFR     Status: Abnormal   Collection Time: 08/09/17 10:05 AM  Result Value Ref Range   Glucose, Bld 102 (H) 65 - 99 mg/dL    Comment: .            Fasting reference interval . For someone without  known diabetes, a glucose value between 100 and 125 mg/dL is consistent with prediabetes and should be confirmed with a follow-up test. .    BUN 11 7 - 25 mg/dL   Creat 0.66 0.50 - 1.10 mg/dL   GFR, Est Non African American 107 > OR = 60 mL/min/1.34m2   GFR, Est African American 124 > OR = 60 mL/min/1.14m2   BUN/Creatinine Ratio NOT APPLICABLE 6 - 22 (calc)   Sodium 138 135 - 146 mmol/L   Potassium 4.2 3.5 - 5.3 mmol/L   Chloride 106 98 - 110 mmol/L   CO2 25 20 - 32 mmol/L   Calcium 9.5 8.6 - 10.2  mg/dL  CBC w/Diff/Platelet     Status: None   Collection Time: 08/09/17 10:05 AM  Result Value Ref Range   WBC 5.9 3.8 - 10.8 Thousand/uL   RBC 4.95 3.80 - 5.10 Million/uL   Hemoglobin 13.7 11.7 - 15.5 g/dL   HCT 39.9 35.0 - 45.0 %   MCV 80.6 80.0 - 100.0 fL   MCH 27.7 27.0 - 33.0 pg   MCHC 34.3 32.0 - 36.0 g/dL   RDW 12.7 11.0 - 15.0 %   Platelets 269 140 - 400 Thousand/uL   MPV 11.2 7.5 - 12.5 fL   Neutro Abs 3,983 1,500 - 7,800 cells/uL   Lymphs Abs 1,251 850 - 3,900 cells/uL   WBC mixed population 484 200 - 950 cells/uL   Eosinophils Absolute 171 15 - 500 cells/uL   Basophils Absolute 12 0 - 200 cells/uL   Neutrophils Relative % 67.5 %   Total Lymphocyte 21.2 %   Monocytes Relative 8.2 %   Eosinophils Relative 2.9 %   Basophils Relative 0.2 %  COMPLETE METABOLIC PANEL WITH GFR     Status: None   Collection Time: 09/21/17 11:13 AM  Result Value Ref Range   Glucose, Bld 88 65 - 139 mg/dL    Comment: .        Non-fasting reference interval .    BUN 8 7 - 25 mg/dL   Creat 0.68 0.50 - 1.10 mg/dL   GFR, Est Non African American 106 > OR = 60 mL/min/1.11m2   GFR, Est African American 122 > OR = 60 mL/min/1.29m2   BUN/Creatinine Ratio NOT APPLICABLE 6 - 22 (calc)   Sodium 140 135 - 146 mmol/L   Potassium 4.1 3.5 - 5.3 mmol/L   Chloride 103 98 - 110 mmol/L   CO2 31 20 - 32 mmol/L   Calcium 9.3 8.6 - 10.2 mg/dL   Total Protein 7.0 6.1 - 8.1 g/dL   Albumin 4.4 3.6 - 5.1  g/dL   Globulin 2.6 1.9 - 3.7 g/dL (calc)   AG Ratio 1.7 1.0 - 2.5 (calc)   Total Bilirubin 0.4 0.2 - 1.2 mg/dL   Alkaline phosphatase (APISO) 35 33 - 115 U/L   AST 11 10 - 35 U/L   ALT 7 6 - 29 U/L  CBC w/Diff/Platelet     Status: None   Collection Time: 09/21/17 11:13 AM  Result Value Ref Range   WBC 3.8 3.8 - 10.8 Thousand/uL   RBC 4.33 3.80 - 5.10 Million/uL   Hemoglobin 12.0 11.7 - 15.5 g/dL   HCT 35.5 35.0 - 45.0 %   MCV 82.0 80.0 - 100.0 fL   MCH 27.7 27.0 - 33.0 pg   MCHC 33.8 32.0 - 36.0 g/dL   RDW 12.4 11.0 - 15.0 %   Platelets 285 140 - 400 Thousand/uL   MPV 10.9 7.5 - 12.5 fL   Neutro Abs 1,664 1,500 - 7,800 cells/uL   Lymphs Abs 1,615 850 - 3,900 cells/uL   WBC mixed population 361 200 - 950 cells/uL   Eosinophils Absolute 141 15 - 500 cells/uL   Basophils Absolute 19 0 - 200 cells/uL   Neutrophils Relative % 43.8 %   Total Lymphocyte 42.5 %   Monocytes Relative 9.5 %   Eosinophils Relative 3.7 %   Basophils Relative 0.5 %  TSH     Status: None   Collection Time: 09/21/17 11:15 AM  Result Value Ref Range   TSH 0.55 mIU/L    Comment:  Reference Range .           > or = 20 Years  0.40-4.50 .                Pregnancy Ranges           First trimester    0.26-2.66           Second trimester   0.55-2.73           Third trimester    0.43-2.91       PHQ2/9: Depression screen [facility] 2/9 09/21/2017 05/24/2017  Decreased Interest 3 1  Down, Depressed, Hopeless 1 1  PHQ - 2 Score 4 2  Altered sleeping 3 3  Tired, decreased energy 1 3  Change in appetite 3 3  Feeling bad or failure about yourself  0 1  Trouble concentrating 1 0  Moving slowly or fidgety/restless 1 1  Suicidal thoughts 0 0  PHQ-9 Score 13 13  Difficult doing work/chores Very difficult Not difficult at all     Fall Risk: Fall Risk  05/24/2017  Falls in the past year? No    Functional Status Survey: Is the patient deaf or have difficulty hearing?: No Does the patient have  difficulty seeing, even when wearing glasses/contacts?: No Does the patient have difficulty concentrating, remembering, or making decisions?: No Does the patient have difficulty walking or climbing stairs?: No Does the patient have difficulty dressing or bathing?: No Does the patient have difficulty doing errands alone such as visiting a doctor's office or shopping?: No    Assessment & Plan  1. Uncontrolled hypertension  - hydrochlorothiazide (HYDRODIURIL) 12.5 MG tablet; Take 2 tablets (25 mg total) by mouth daily.  Dispense: 30 tablet; Refill: 0 - amLODipine (NORVASC) 2.5 MG tablet; Take 1 tablet (2.5 mg total) by mouth daily.  Dispense: 30 tablet; Refill: 0 Return this pm for bp check and in a few days to see me again, time off work for one week  2. MS (multiple sclerosis) ([place])  Keep follow up with neurologist, on Copaxone  3. Headache disorder  It can be stress related or from uncontrolled HTN, advised Tylenol only, avoid NSAID's  4. Other insomnia  - traZODone (DESYREL) 50 MG tablet; Take 0.5-1 tablets (25-50 mg total) by mouth at bedtime as needed for sleep.  Dispense: 30 tablet; Refill: 3  5. Work-related stress  She states " I hate my job" - she is having conflict with the lead driver, advised to stay out of work until bp improves

## 2017-09-27 ENCOUNTER — Ambulatory Visit (INDEPENDENT_AMBULATORY_CARE_PROVIDER_SITE_OTHER): Payer: Medicare Other | Admitting: Family Medicine

## 2017-09-27 ENCOUNTER — Encounter: Payer: Self-pay | Admitting: Family Medicine

## 2017-09-27 VITALS — BP 114/76 | HR 97 | Temp 98.1°F | Resp 16 | Ht 62.0 in | Wt 160.9 lb

## 2017-09-27 DIAGNOSIS — Z566 Other physical and mental strain related to work: Secondary | ICD-10-CM

## 2017-09-27 DIAGNOSIS — I10 Essential (primary) hypertension: Secondary | ICD-10-CM | POA: Diagnosis not present

## 2017-09-27 DIAGNOSIS — G4709 Other insomnia: Secondary | ICD-10-CM

## 2017-09-27 DIAGNOSIS — Z23 Encounter for immunization: Secondary | ICD-10-CM

## 2017-09-27 DIAGNOSIS — E8881 Metabolic syndrome: Secondary | ICD-10-CM

## 2017-09-27 MED ORDER — HYDROCHLOROTHIAZIDE 25 MG PO TABS: 25.0000 mg | ORAL_TABLET | Freq: Every day | ORAL | Status: DC

## 2017-09-27 MED ORDER — METFORMIN HCL ER 500 MG PO TB24: 500.0000 mg | ORAL_TABLET | Freq: Every day | ORAL | 2 refills | Status: DC

## 2017-09-27 NOTE — Unmapped (Signed)
I sent the form to you via interoffice.  Patient has [facility] and it does not appear to meet criteria for modafinil.

## 2017-09-27 NOTE — Progress Notes (Signed)
Name: Tammie Lopez   MRN: [id number]    DOB: 09-02-1971   Date:09/27/2017       Progress Note  Subjective  Chief Complaint  Chief Complaint  Patient presents with  . Follow-up    3 day   . Hypertension    Doing well with new medication, has taken the week off of work and doing well. BP today was 114/76. States stress is the biggest factor due to work environment.   . Obesity    Would like to discuss medication for weight loss.    HPI  HTN: she is now on HCTZ 25 mg and Norvasc 2.5 mg since last week, BP went down from 160's to 117/76. She states stress is lower, headache resolved, she has been able to sleep well on 25 mg of Trazodone. She denies side effects of medications. 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    Patient Active Problem List   Diagnosis Date Noted  . Insulin resistance 09/27/2017  . Essential hypertension 09/21/2017  . Right sided sciatica 09/21/2017  . Major depression, recurrent ([place]) 05/24/2017  . Migraine without aura, not intractable 12/09/2016  . Obese 12/09/2016  . Nickel allergy 12/09/2016  . GERD without esophagitis 12/09/2016  . Leukopenia 12/09/2016  . Dyslipidemia 12/09/2016  . Diverticulosis 12/09/2016  . 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 Major depression in remission ([place]) 12/09/2016  . Allergic rhinitis, seasonal 12/09/2016  . Chronic constipation 12/09/2016  . Vitamin D deficiency disease 08/06/2013  . MS (multiple sclerosis) ([place]) 04/17/2013  . History of hypertension 06/21/2012    Past Surgical History:  Procedure Laterality Date  . BUNIONECTOMY    .  CESAREAN SECTION N/A 2006  . GANGLION CYST EXCISION Bilateral    Hands    Family History  Problem Relation Age of Onset  . Diabetes Mother   . Hypertension Mother   . Cancer Father 75       Mouth  . COPD Father   . Hypertension Brother   . Diabetes Brother     Social History   Social History  . Marital status: Single    Spouse name: N/A  . Number of children: N/A  . Years of education: N/A   Occupational History  . Not on file.   Social History Main Topics  . Smoking status: Never Smoker  . Smokeless tobacco: Never Used  . Alcohol use No  . Drug use: No  . Sexual activity: Not Currently    Partners: Male   Other Topics Concern  . 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  amitriptyline (ELAVIL) 25 MG tablet, Take 1 tablet by mouth as needed for sleep., Disp: , Rfl: 2 .  amLODipine (NORVASC) 2.5 MG tablet, Take 1 tablet (2.5 mg total) by mouth daily., Disp: 30 tablet, Rfl: 0 .  dicyclomine (BENTYL) 10 MG capsule, Take 1 capsule (10 mg total)  by mouth 3 (three) times daily as needed for spasms., Disp: 20 capsule, Rfl: 0 .  escitalopram (LEXAPRO) 10 MG tablet, Take 1 tablet (10 mg total) by mouth daily., Disp: 30 tablet, Rfl: 0 .  gabapentin (NEURONTIN) 300 MG capsule, Take 600 mg by mouth 3 (three) times daily., Disp: , Rfl:  .  Glatiramer Acetate (COPAXONE) 40 MG/ML SOSY, Inject 40 mg into the skin 3 (three) times a week., Disp: , Rfl:  .  hydrochlorothiazide (HYDRODIURIL) 25 MG tablet, Take 1 tablet (25 mg total) by mouth daily., Disp: 30 tablet, Rfl:  .  linaclotide (LINZESS) 145 MCG CAPS capsule, Take 1 capsule (145 mcg total) by mouth daily., Disp: 30 capsule, Rfl: 5 .  modafinil (PROVIGIL) 100 MG tablet, Take 1 tablet by mouth daily., Disp: , Rfl:  .  ondansetron (ZOFRAN) 4 MG tablet, Take 1 tablet (4 mg total) by mouth  every 8 (eight) hours as needed for nausea or vomiting., Disp: 20 tablet, Rfl: 0 .  SUMAtriptan (IMITREX) 100 MG tablet, Take 1 tablet (100 mg total) by mouth every 2 (two) hours as needed for migraine. May repeat in 2 hours if headache persists or recurs., Disp: 9 tablet, Rfl: 1 .  traZODone (DESYREL) 50 MG tablet, Take 0.5-1 tablets (25-50 mg total) by mouth at bedtime as needed for sleep., Disp: 30 tablet, Rfl: 3 .  metFORMIN (GLUCOPHAGE-XR) 500 MG 24 hr tablet, Take 1 tablet (500 mg total) by mouth daily with breakfast., Disp: 30 tablet, Rfl: 2  Allergies  Allergen Reactions  . Penicillins Hives     ROS  Constitutional: Negative for fever or weight change.  Respiratory: Negative for cough and shortness of breath.   Cardiovascular: Negative for chest pain or palpitations.  Gastrointestinal: Negative for abdominal pain, no bowel changes.  Musculoskeletal: Negative for gait problem or joint swelling.  Skin: Negative for rash.  Neurological: Negative for dizziness or headache.  No other specific complaints in a complete review of systems (except as listed in HPI above).  Objective  Vitals:   09/27/17 1122  BP: 114/76  Pulse: 97  Resp: 16  Temp: 98.1 F (36.7 C)  TempSrc: Oral  SpO2: 97%  Weight: 160 lb 14.4 oz (73 kg)  Height: 5\' 2"  (1.575 m)    Body mass index is 29.43 kg/m.  Physical Exam  Constitutional: Patient appears well-developed and well-nourished. Overweight.  No distress.  HEENT: head atraumatic, normocephalic, pupils equal and reactive to light, neck supple, throat within normal limits Cardiovascular: Normal rate, regular rhythm and normal heart sounds.  No murmur heard. No BLE edema. Pulmonary/Chest: Effort normal and breath sounds normal. No respiratory distress. Abdominal: Soft.  There is no tenderness. Psychiatric: Patient has a normal mood and affect. behavior is normal. Judgment and thought content normal.  Recent Results (from the past 2160 hour(s))   BASIC METABOLIC PANEL WITH GFR     Status: Abnormal   Collection Time: 08/09/17 10:05 AM  Result Value Ref Range   Glucose, Bld 102 (H) 65 - 99 mg/dL    Comment: .            Fasting reference interval . For someone without known diabetes, a glucose value between 100 and 125 mg/dL is consistent with prediabetes and should be confirmed with a follow-up test. .    BUN 11 7 - 25 mg/dL   Creat 0.66 0.50 - 1.10 mg/dL   GFR, Est Non African American 107 > OR = 60 mL/min/1.66m2   GFR,  Est African American 124 > OR = 60 mL/min/1.17m2   BUN/Creatinine Ratio NOT APPLICABLE 6 - 22 (calc)   Sodium 138 135 - 146 mmol/L   Potassium 4.2 3.5 - 5.3 mmol/L   Chloride 106 98 - 110 mmol/L   CO2 25 20 - 32 mmol/L   Calcium 9.5 8.6 - 10.2 mg/dL  CBC w/Diff/Platelet     Status: None   Collection Time: 08/09/17 10:05 AM  Result Value Ref Range   WBC 5.9 3.8 - 10.8 Thousand/uL   RBC 4.95 3.80 - 5.10 Million/uL   Hemoglobin 13.7 11.7 - 15.5 g/dL   HCT 39.9 35.0 - 45.0 %   MCV 80.6 80.0 - 100.0 fL   MCH 27.7 27.0 - 33.0 pg   MCHC 34.3 32.0 - 36.0 g/dL   RDW 12.7 11.0 - 15.0 %   Platelets 269 140 - 400 Thousand/uL   MPV 11.2 7.5 - 12.5 fL   Neutro Abs 3,983 1,500 - 7,800 cells/uL   Lymphs Abs 1,251 850 - 3,900 cells/uL   WBC mixed population 484 200 - 950 cells/uL   Eosinophils Absolute 171 15 - 500 cells/uL   Basophils Absolute 12 0 - 200 cells/uL   Neutrophils Relative % 67.5 %   Total Lymphocyte 21.2 %   Monocytes Relative 8.2 %   Eosinophils Relative 2.9 %   Basophils Relative 0.2 %  COMPLETE METABOLIC PANEL WITH GFR     Status: None   Collection Time: 09/21/17 11:13 AM  Result Value Ref Range   Glucose, Bld 88 65 - 139 mg/dL    Comment: .        Non-fasting reference interval .    BUN 8 7 - 25 mg/dL   Creat 0.68 0.50 - 1.10 mg/dL   GFR, Est Non African American 106 > OR = 60 mL/min/1.102m2   GFR, Est African American 122 > OR = 60 mL/min/1.27m2   BUN/Creatinine Ratio NOT  APPLICABLE 6 - 22 (calc)   Sodium 140 135 - 146 mmol/L   Potassium 4.1 3.5 - 5.3 mmol/L   Chloride 103 98 - 110 mmol/L   CO2 31 20 - 32 mmol/L   Calcium 9.3 8.6 - 10.2 mg/dL   Total Protein 7.0 6.1 - 8.1 g/dL   Albumin 4.4 3.6 - 5.1 g/dL   Globulin 2.6 1.9 - 3.7 g/dL (calc)   AG Ratio 1.7 1.0 - 2.5 (calc)   Total Bilirubin 0.4 0.2 - 1.2 mg/dL   Alkaline phosphatase (APISO) 35 33 - 115 U/L   AST 11 10 - 35 U/L   ALT 7 6 - 29 U/L  CBC w/Diff/Platelet     Status: None   Collection Time: 09/21/17 11:13 AM  Result Value Ref Range   WBC 3.8 3.8 - 10.8 Thousand/uL   RBC 4.33 3.80 - 5.10 Million/uL   Hemoglobin 12.0 11.7 - 15.5 g/dL   HCT 35.5 35.0 - 45.0 %   MCV 82.0 80.0 - 100.0 fL   MCH 27.7 27.0 - 33.0 pg   MCHC 33.8 32.0 - 36.0 g/dL   RDW 12.4 11.0 - 15.0 %   Platelets 285 140 - 400 Thousand/uL   MPV 10.9 7.5 - 12.5 fL   Neutro Abs 1,664 1,500 - 7,800 cells/uL   Lymphs Abs 1,615 850 - 3,900 cells/uL   WBC mixed population 361 200 - 950 cells/uL   Eosinophils Absolute 141 15 - 500 cells/uL   Basophils Absolute 19 0 - 200 cells/uL   Neutrophils Relative %  43.8 %   Total Lymphocyte 42.5 %   Monocytes Relative 9.5 %   Eosinophils Relative 3.7 %   Basophils Relative 0.5 %  TSH     Status: None   Collection Time: 09/21/17 11:15 AM  Result Value Ref Range   TSH 0.55 mIU/L    Comment:           Reference Range .           > or = 20 Years  0.40-4.50 .                Pregnancy Ranges           First trimester    0.26-2.66           Second trimester   0.55-2.73           Third trimester    0.43-2.91       PHQ2/9: Depression screen [facility] 2/9 09/21/2017 05/24/2017  Decreased Interest 3 1  Down, Depressed, Hopeless 1 1  PHQ - 2 Score 4 2  Altered sleeping 3 3  Tired, decreased energy 1 3  Change in appetite 3 3  Feeling bad or failure about yourself  0 1  Trouble concentrating 1 0  Moving slowly or fidgety/restless 1 1  Suicidal thoughts 0 0  PHQ-9 Score 13 13   Difficult doing work/chores Very difficult Not difficult at all     Fall Risk: Fall Risk  05/24/2017  Falls in the past year? No    Assessment & Plan  1. 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  Dispense: 30 tablet   2. Need for immunization against influenza  - Flu Vaccine QUAD 6+ mos PF IM (Fluarix Quad PF)  3. 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 Insulin resistance  Not responding to Trulicity, willing to try Metformin again to help her lose weight and prevent DM progression  - metFORMIN (GLUCOPHAGE-XR) 500 MG 24 hr tablet; Take 1 tablet (500 mg total) by mouth daily with breakfast.  Dispense: 30 tablet; Refill: 2  5. Other insomnia  Doing well on Trazodone 25 mg daily

## 2017-10-04 MED FILL — COPAXONE/40MG/ML/SOSY: COPAXONE/40MG/ML/SOSY | 28 days supply | Qty: 12 | Fill #0

## 2017-10-05 ENCOUNTER — Ambulatory Visit: Payer: Medicare Other | Admitting: Family Medicine

## 2017-10-06 NOTE — Unmapped (Signed)
[employer] approved [place] for modafinil 100mg  tablets, #60/30 days, Effective thru 11/28/17

## 2017-10-07 NOTE — Unmapped (Signed)
No, there is one from October with 5 refills.

## 2017-10-17 MED ORDER — AMITRIPTYLINE 25 MG TABLET
ORAL_TABLET | Freq: Every evening | ORAL | 0 refills | 0 days | Status: CP | PRN
Start: 2017-10-17 — End: 2017-11-16

## 2017-10-17 NOTE — Unmapped (Signed)
[facility] is requesting a 90 day supply of amitriptyline 25mg  tablets. Can you please change in Epic and resend?

## 2017-10-18 ENCOUNTER — Encounter: Payer: Self-pay | Admitting: Family Medicine

## 2017-10-18 ENCOUNTER — Ambulatory Visit (INDEPENDENT_AMBULATORY_CARE_PROVIDER_SITE_OTHER): Payer: Medicare Other | Admitting: Family Medicine

## 2017-10-18 ENCOUNTER — Other Ambulatory Visit: Payer: Self-pay

## 2017-10-18 VITALS — BP 134/88 | HR 90 | Resp 14 | Ht 62.0 in | Wt 162.1 lb

## 2017-10-18 DIAGNOSIS — M5441 Lumbago with sciatica, right side: Secondary | ICD-10-CM | POA: Diagnosis not present

## 2017-10-18 DIAGNOSIS — Z566 Other physical and mental strain related to work: Secondary | ICD-10-CM

## 2017-10-18 DIAGNOSIS — I10 Essential (primary) hypertension: Secondary | ICD-10-CM

## 2017-10-18 DIAGNOSIS — G4709 Other insomnia: Secondary | ICD-10-CM

## 2017-10-18 MED ORDER — HYDROCHLOROTHIAZIDE 25 MG PO TABS: 25.0000 mg | ORAL_TABLET | Freq: Every day | ORAL | Status: DC

## 2017-10-18 MED ORDER — AMLODIPINE BESYLATE 5 MG PO TABS: 2.5000 mg | ORAL_TABLET | Freq: Every day | ORAL | 0 refills | Status: DC

## 2017-10-18 NOTE — Telephone Encounter (Signed)
Refill request for general medication. [facility]odone [facility]  Last office visit: 10/18/2017

## 2017-10-18 NOTE — Progress Notes (Signed)
Name: Tammie Lopez   MRN: [id number]    DOB: Jun 16, 1971   Date:10/18/2017       Progress Note  Subjective  Chief Complaint  Chief Complaint  Patient presents with  . 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 She stopped Lexapro on her own, states she is not depressed. BP is very high today and states bp goes up at home, and has been taking norvasc one to two pills depending on bp reading at home, she is not sure if she took hctz this am. No headaches or chest pain. Sleeping better.   Low back pain: chronic, aching like on lower back , right now 7/10, she has been waring a back brace at times to improve pain, but did not help. Pain goes down right leg. Discussed prednisone taper but she does not want to take it because it causes weight gain. Denies bowel or bladder incontinence.    Patient Active Problem List   Diagnosis Date Noted  . Insulin resistance 09/27/2017  . Essential hypertension 09/21/2017  . Right sided sciatica 09/21/2017  . Major depression, recurrent ([place]) 05/24/2017  . Migraine without aura, not intractable 12/09/2016  . Obese 12/09/2016  . Nickel allergy 12/09/2016  . GERD without esophagitis 12/09/2016  . Leukopenia 12/09/2016  . Dyslipidemia 12/09/2016  . Diverticulosis 12/09/2016  . 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 Major depression in remission ([place]) 12/09/2016  . Allergic rhinitis, seasonal 12/09/2016  . Chronic constipation 12/09/2016  . Vitamin D deficiency disease 08/06/2013  . MS (multiple sclerosis) ([place]) 04/17/2013  . History of hypertension 06/21/2012    Past Surgical History:  Procedure Laterality Date  . BUNIONECTOMY    . CESAREAN SECTION N/A 2006  . GANGLION CYST EXCISION Bilateral    Hands    Family History  Problem Relation Age of Onset  . Diabetes Mother   . Hypertension Mother   . Cancer Father 75       Mouth  . COPD Father   .  Hypertension Brother   . Diabetes Brother     Social History   Socioeconomic History  . Marital status: Single    Spouse name: Not on file  . Number of children: Not on file  . Years of education: Not on file  . Highest education level: Not on file  Social Needs  . Financial resource strain: Not on file  . Food insecurity - worry: Not on file  . Food insecurity - inability: Not on file  . Transportation needs - medical: Not on file  . Transportation needs - non-medical: Not on file  Occupational History  . Not on file  Tobacco Use  . Smoking status: Never Smoker  . Smokeless tobacco: Never Used  Substance and Sexual Activity  . Alcohol use: No  . Drug use: No  . Sexual activity: Not Currently    Partners: Male  Other Topics Concern  . 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  amitriptyline (ELAVIL) 25 MG tablet, Take 1 tablet by mouth as needed for sleep., Disp: , Rfl: 2 .  amLODipine (NORVASC) 5 MG tablet, Take 0.5-1 tablets (2.5-5 mg total) by mouth daily., Disp: 60 tablet, Rfl: 0 .  dicyclomine (BENTYL) 10 MG capsule, Take 1 capsule (10 mg total) by mouth 3 (three) times daily as needed for spasms., Disp: 20 capsule, Rfl: 0 .  gabapentin (NEURONTIN) 300 MG capsule, Take 600 mg by mouth 3 (three) times daily., Disp: , Rfl:  .  Glatiramer Acetate (COPAXONE) 40 MG/ML SOSY, Inject 40 mg into the skin 3 (three) times a week., Disp: , Rfl:  .  hydrochlorothiazide (HYDRODIURIL) 25 MG tablet, Take 1 tablet (25 mg total) by mouth daily., Disp: 30 tablet, Rfl:  .  linaclotide (LINZESS) 145 MCG CAPS capsule, Take 1 capsule (145 mcg total) by mouth daily., Disp: 30 capsule, Rfl: 5 .  modafinil (PROVIGIL) 100 MG tablet, Take 1 tablet by mouth daily., Disp: , Rfl:  .  SUMAtriptan (IMITREX) 100 MG tablet, Take 1 tablet (100 mg total) by mouth every  2 (two) hours as needed for migraine. May repeat in 2 hours if headache persists or recurs., Disp: 9 tablet, Rfl: 1 .  traZODone (DESYREL) 50 MG tablet, Take 0.5-1 tablets (25-50 mg total) by mouth at bedtime as needed for sleep., Disp: 30 tablet, Rfl: 3 .  ondansetron (ZOFRAN) 4 MG tablet, Take 1 tablet (4 mg total) by mouth every 8 (eight) hours as needed for nausea or vomiting. (Patient not taking: Reported on 10/18/2017), Disp: 20 tablet, Rfl: 0  Allergies  Allergen Reactions  . Penicillins Hives     ROS  Ten systems reviewed and is negative except as mentioned in HPI   Objective  Vitals:   10/18/17 1105 10/18/17 1201  BP: (!) 160/100 134/88  Pulse: 90   Resp: 14   SpO2: 96%   Weight: 162 lb 1.6 oz (73.5 kg)   Height: 5\' 2"  (1.575 m)     Body mass index is 29.65 kg/m.  Physical Exam  Constitutional: Patient appears well-developed and well-nourished. Obese No distress.  HEENT: head atraumatic, normocephalic, pupils equal and reactive to light,  neck supple, throat within normal limits Cardiovascular: Normal rate, regular rhythm and normal heart sounds.  No murmur heard. No BLE edema. Pulmonary/Chest: Effort normal and breath sounds normal. No respiratory distress. Abdominal: Soft.  There is no tenderness. Psychiatric: Patient has a normal mood and affect. behavior is normal. Judgment and thought content normal. Muscular Skeletal: pain during palpation of lumbar spine, negative straight leg raise  Recent Results (from the past 2160 hour(s))  BASIC METABOLIC PANEL WITH GFR     Status: Abnormal   Collection Time: 08/09/17 10:05 AM  Result Value Ref Range   Glucose, Bld 102 (H) 65 - 99 mg/dL    Comment: .            Fasting reference interval . For someone without known diabetes, a glucose value between 100 and 125 mg/dL is consistent with prediabetes and should be confirmed with a follow-up test. .    BUN 11 7 - 25 mg/dL   Creat 0.66 0.50 - 1.10 mg/dL   GFR, Est  Non African American 107 > OR = 60 mL/min/1.107m2   GFR, Est African American 124 > OR = 60 mL/min/1.63m2   BUN/Creatinine Ratio NOT APPLICABLE 6 - 22 (calc)   Sodium 138 135 - 146 mmol/L   Potassium 4.2 3.5 - 5.3 mmol/L   Chloride 106 98 - 110 mmol/L   CO2 25 20 - 32 mmol/L   Calcium 9.5 8.6 - 10.2 mg/dL  CBC w/Diff/Platelet     Status: None   Collection Time: 08/09/17 10:05 AM  Result  Value Ref Range   WBC 5.9 3.8 - 10.8 Thousand/uL   RBC 4.95 3.80 - 5.10 Million/uL   Hemoglobin 13.7 11.7 - 15.5 g/dL   HCT 39.9 35.0 - 45.0 %   MCV 80.6 80.0 - 100.0 fL   MCH 27.7 27.0 - 33.0 pg   MCHC 34.3 32.0 - 36.0 g/dL   RDW 12.7 11.0 - 15.0 %   Platelets 269 140 - 400 Thousand/uL   MPV 11.2 7.5 - 12.5 fL   Neutro Abs 3,983 1,500 - 7,800 cells/uL   Lymphs Abs 1,251 850 - 3,900 cells/uL   WBC mixed population 484 200 - 950 cells/uL   Eosinophils Absolute 171 15 - 500 cells/uL   Basophils Absolute 12 0 - 200 cells/uL   Neutrophils Relative % 67.5 %   Total Lymphocyte 21.2 %   Monocytes Relative 8.2 %   Eosinophils Relative 2.9 %   Basophils Relative 0.2 %  COMPLETE METABOLIC PANEL WITH GFR     Status: None   Collection Time: 09/21/17 11:13 AM  Result Value Ref Range   Glucose, Bld 88 65 - 139 mg/dL    Comment: .        Non-fasting reference interval .    BUN 8 7 - 25 mg/dL   Creat 0.68 0.50 - 1.10 mg/dL   GFR, Est Non African American 106 > OR = 60 mL/min/1.52m2   GFR, Est African American 122 > OR = 60 mL/min/1.82m2   BUN/Creatinine Ratio NOT APPLICABLE 6 - 22 (calc)   Sodium 140 135 - 146 mmol/L   Potassium 4.1 3.5 - 5.3 mmol/L   Chloride 103 98 - 110 mmol/L   CO2 31 20 - 32 mmol/L   Calcium 9.3 8.6 - 10.2 mg/dL   Total Protein 7.0 6.1 - 8.1 g/dL   Albumin 4.4 3.6 - 5.1 g/dL   Globulin 2.6 1.9 - 3.7 g/dL (calc)   AG Ratio 1.7 1.0 - 2.5 (calc)   Total Bilirubin 0.4 0.2 - 1.2 mg/dL   Alkaline phosphatase (APISO) 35 33 - 115 U/L   AST 11 10 - 35 U/L   ALT 7 6 - 29 U/L  CBC  w/Diff/Platelet     Status: None   Collection Time: 09/21/17 11:13 AM  Result Value Ref Range   WBC 3.8 3.8 - 10.8 Thousand/uL   RBC 4.33 3.80 - 5.10 Million/uL   Hemoglobin 12.0 11.7 - 15.5 g/dL   HCT 35.5 35.0 - 45.0 %   MCV 82.0 80.0 - 100.0 fL   MCH 27.7 27.0 - 33.0 pg   MCHC 33.8 32.0 - 36.0 g/dL   RDW 12.4 11.0 - 15.0 %   Platelets 285 140 - 400 Thousand/uL   MPV 10.9 7.5 - 12.5 fL   Neutro Abs 1,664 1,500 - 7,800 cells/uL   Lymphs Abs 1,615 850 - 3,900 cells/uL   WBC mixed population 361 200 - 950 cells/uL   Eosinophils Absolute 141 15 - 500 cells/uL   Basophils Absolute 19 0 - 200 cells/uL   Neutrophils Relative % 43.8 %   Total Lymphocyte 42.5 %   Monocytes Relative 9.5 %   Eosinophils Relative 3.7 %   Basophils Relative 0.5 %  TSH     Status: None   Collection Time: 09/21/17 11:15 AM  Result Value Ref Range   TSH 0.55 mIU/L    Comment:           Reference Range .           >  or = 20 Years  0.40-4.50 .                Pregnancy Ranges           First trimester    0.26-2.66           Second trimester   0.55-2.73           Third trimester    0.43-2.91       PHQ2/9: Depression screen [facility] 2/9 09/21/2017 05/24/2017  Decreased Interest 3 1  Down, Depressed, Hopeless 1 1  PHQ - 2 Score 4 2  Altered sleeping 3 3  Tired, decreased energy 1 3  Change in appetite 3 3  Feeling bad or failure about yourself  0 1  Trouble concentrating 1 0  Moving slowly or fidgety/restless 1 1  Suicidal thoughts 0 0  PHQ-9 Score 13 13  Difficult doing work/chores Very difficult Not difficult at all     Fall Risk: Fall Risk  10/18/2017 05/24/2017  Falls in the past year? No No     Functional Status Survey: Is the patient deaf or have difficulty hearing?: No Does the patient have difficulty seeing, even when wearing glasses/contacts?: No Does the patient have difficulty concentrating, remembering, or making decisions?: No Does the patient have difficulty walking or climbing  stairs?: No Does the patient have difficulty dressing or bathing?: No Does the patient have difficulty doing errands alone such as visiting a doctor's office or shopping?: No    Assessment & Plan  1. Uncontrolled hypertension  - amLODipine (NORVASC) 5 MG tablet; Take 0.5-1 tablets (2.5-5 mg total) by mouth daily.  Dispense: 60 tablet; Refill: 0  bp at home can go up to 150, but normal at times, advised to take it prn   2. Work-related stress  She is also very upset about her weight, also stress at work, however stress is better because she is no longer working early in am.   3. Acute midline low back pain with right-sided sciatica  Refused prednisone

## 2017-10-19 MED ORDER — TRAZODONE HCL 50 MG PO TABS: 25.0000 mg | ORAL_TABLET | Freq: Every evening | ORAL | 3 refills | Status: DC | PRN

## 2017-10-25 NOTE — Unmapped (Signed)
[facility] Refill Coordination Note  Specialty Medication(s): COPAXONE      Hannah Barr, DOB: 1971-09-24  Phone: [phone] (home) , Alternate phone contact: N/A  Phone or address changes today?: No  All above HIPAA information was verified with patient.  Shipping Address: [address]  [place] [address]   Insurance changes? No    Completed refill call assessment today to schedule patient's medication shipment from the [facility] [phone]).      Confirmed the medication and dosage are correct and have not changed: Yes, regimen is correct and unchanged.    Confirmed patient started or stopped the following medications in the past month:  No, there are no changes reported at this time.    Are you tolerating your medication?:  Hannah Barr reports tolerating the medication.    ADHERENCE    (Below is required for Medicare Part B or Transplant patients only - per drug):   How many tablets were dispensed last month: 12  Patient currently has 4 remaining.    Did you miss any doses in the past 4 weeks? No missed doses reported.    FINANCIAL/SHIPPING    Delivery Scheduled: Yes, Expected medication delivery date: 11/01/17     Hannah Barr did not have any additional questions at this time.    Delivery address validated in [facility] scheduling system: Yes, address listed in [facility] is correct.    We will follow up with patient monthly for standard refill processing and delivery.      Thank you,  Westley Gambles   [facility] Specialty Technician

## 2017-10-26 ENCOUNTER — Encounter: Payer: Self-pay | Admitting: Emergency Medicine

## 2017-10-26 ENCOUNTER — Other Ambulatory Visit: Payer: Self-pay

## 2017-10-26 ENCOUNTER — Ambulatory Visit
Admission: EM | Admit: 2017-10-26 | Discharge: 2017-10-26 | Disposition: A | Discharge status: Home / Self Care | Payer: Medicare Other | Attending: Family Medicine | Admitting: Family Medicine

## 2017-10-26 DIAGNOSIS — M5441 Lumbago with sciatica, right side: Secondary | ICD-10-CM

## 2017-10-26 MED ORDER — MELOXICAM 15 MG PO TABS: 15.0000 mg | ORAL_TABLET | Freq: Every day | ORAL | 0 refills | Status: DC | PRN

## 2017-10-26 MED ORDER — CYCLOBENZAPRINE HCL 10 MG PO TABS: 10.0000 mg | ORAL_TABLET | Freq: Two times a day (BID) | ORAL | 0 refills | Status: DC | PRN

## 2017-10-26 NOTE — Discharge Instructions (Signed)
Take medication as prescribed. Rest. Drink plenty of fluids. Stretch.   Follow up with your primary care physician this week as needed. Return to Urgent care for new or worsening concerns.

## 2017-10-26 NOTE — ED Provider Notes (Signed)
[facility] ____________________________________________  Time seen: Approximately 8:52 AM  I have reviewed the triage vital signs and the nursing notes.   HISTORY  Chief Complaint Back Pain  HPI Tammie Lopez is a 46 y.o. female presenting for evaluation of low back pain.  Patient states low back pain is been present for the last 1-2 months.  Denies injury.  Denies fall, direct trauma or known trigger.  Patient reports pain gradually increased and that has remained.  Patient states that she has taken some over-the-counter Tylenol intermittently without change.  Has not been taking any other over-the-counter medications for the same complaints.  Patient denies history of chronic low back pain.  States pain is primarily with movement.  States improves with being still and at rest.  States occasionally pain radiates down right leg, does not constantly radiate.  Denies any paresthesias, urinary bowel retention or incontinence, dysuria, abdominal pain, chest pain, shortness of breath, fevers, extremity pain or rash.  Reports continues to eat and drink well.  Denies other aggravating or alleviating factors.  Reports otherwise feels well. Denies recent sickness. Denies recent antibiotic use.   Steele Sizer, MD: PCP Patient's last menstrual period was 10/05/2017 (approximate). Denies pregnancy.    Past Medical History:  Diagnosis Date  . Allergy   . Diverticula of colon   . MS (multiple sclerosis) [facility])     Patient Active Problem List   Diagnosis Date Noted  . Insulin resistance 09/27/2017  . Essential hypertension 09/21/2017  . Right sided sciatica 09/21/2017  . Major depression, recurrent ([place]) 05/24/2017  . Migraine without aura, not intractable 12/09/2016  . Obese 12/09/2016  . Nickel allergy 12/09/2016  . GERD without esophagitis 12/09/2016  . Leukopenia 12/09/2016  . Dyslipidemia 12/09/2016  . Diverticulosis 12/09/2016  . 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 Major  depression in remission ([place]) 12/09/2016  . Allergic rhinitis, seasonal 12/09/2016  . Chronic constipation 12/09/2016  . Vitamin D deficiency disease 08/06/2013  . MS (multiple sclerosis) ([place]) 04/17/2013  . History of hypertension 06/21/2012    Past Surgical History:  Procedure Laterality Date  . BUNIONECTOMY    . CESAREAN SECTION N/A 2006  . GANGLION CYST EXCISION Bilateral    Hands     No current facility-administered medications for this encounter.   Current Outpatient Medications:  .  amitriptyline (ELAVIL) 25 MG tablet, Take 1 tablet by mouth as needed for sleep., Disp: , Rfl: 2 .  amLODipine (NORVASC) 5 MG tablet, Take 0.5-1 tablets (2.5-5 mg total) by mouth daily., Disp: 60 tablet, Rfl: 0 .  dicyclomine (BENTYL) 10 MG capsule, Take 1 capsule (10 mg total) by mouth 3 (three) times daily as needed for spasms., Disp: 20 capsule, Rfl: 0 .  gabapentin (NEURONTIN) 300 MG capsule, Take 600 mg by mouth 3 (three) times daily., Disp: , Rfl:  .  Glatiramer Acetate (COPAXONE) 40 MG/ML SOSY, Inject 40 mg into the skin 3 (three) times a week., Disp: , Rfl:  .  hydrochlorothiazide (HYDRODIURIL) 25 MG tablet, Take 1 tablet (25 mg total) by mouth daily., Disp: 30 tablet, Rfl:  .  linaclotide (LINZESS) 145 MCG CAPS capsule, Take 1 capsule (145 mcg total) by mouth daily., Disp: 30 capsule, Rfl: 5 .  modafinil (PROVIGIL) 100 MG tablet, Take 1 tablet by mouth daily., Disp: , Rfl:  .  ondansetron (ZOFRAN) 4 MG tablet, Take 1 tablet (4 mg total) by mouth every 8 (eight) hours as needed for nausea or vomiting., Disp: 20 tablet, Rfl: 0 .  SUMAtriptan (IMITREX) 100 MG tablet, Take 1 tablet (100 mg total) by mouth every 2 (two) hours as needed for migraine. May repeat in 2 hours if headache persists or recurs., Disp: 9 tablet, Rfl: 1 .  traZODone (DESYREL) 50 MG tablet, Take 0.5-1 tablets (25-50 mg total) by mouth at bedtime as needed for sleep., Disp: 30 tablet, Rfl: 3 .  cyclobenzaprine (FLEXERIL) 10  MG tablet, Take 1 tablet (10 mg total) by mouth 2 (two) times daily as needed for muscle spasms. Do not drive while taking as can cause drowsiness, Disp: 15 tablet, Rfl: 0 .  meloxicam (MOBIC) 15 MG tablet, Take 1 tablet (15 mg total) by mouth daily as needed., Disp: 10 tablet, Rfl: 0  Allergies Penicillins  Family History  Problem Relation Age of Onset  . Diabetes Mother   . Hypertension Mother   . Cancer Father 75       Mouth  . COPD Father   . Hypertension Brother   . Diabetes Brother     Social History Social History   Tobacco Use  . Smoking status: Never Smoker  . Smokeless tobacco: Never Used  Substance Use Topics  . Alcohol use: No  . Drug use: No    Review of Systems Constitutional: No fever/chills Cardiovascular: Denies chest pain. Respiratory: Denies shortness of breath. Gastrointestinal: No abdominal pain.  No nausea, no vomiting.  No diarrhea.  No constipation. Genitourinary: Negative for dysuria. Musculoskeletal: positive for back pain. Skin: Negative for rash.   ____________________________________________   PHYSICAL EXAM:  VITAL SIGNS: ED Triage Vitals  Enc Vitals Group     BP 10/26/17 0833 138/86     Pulse Rate 10/26/17 0833 82     Resp 10/26/17 0833 14     Temp 10/26/17 0833 98.3 F (36.8 C)     Temp Source 10/26/17 0833 Oral     SpO2 10/26/17 0833 98 %     Weight 10/26/17 0829 162 lb 11.2 oz (73.8 kg)     Height 10/26/17 0829 5\' 2"  (1.575 m)     Head Circumference --      Peak Flow --      Pain Score 10/26/17 0830 7     Pain Loc --      Pain Edu? --      Excl. in [place]? --     Constitutional: Alert and oriented. Well appearing and in no acute distress. Cardiovascular: Normal rate, regular rhythm. Grossly normal heart sounds.  Good peripheral circulation. Respiratory: Normal respiratory effort without tachypnea nor retractions. Breath sounds are clear and equal bilaterally. No wheezes, rales, rhonchi. Gastrointestinal: Soft and  nontender. No distention. Normal Bowel sounds. No CVA tenderness. Musculoskeletal:  Nontender with normal range of motion in all extremities. No midline cervical, thoracic or lumbar tenderness to palpation. Bilateral pedal pulses equal and easily palpated.      Right lower leg:  No tenderness or edema.      Left lower leg:  No tenderness or edema.  No midline lumbar tenderness to palpation.  Mild right paralumbar, mild to moderate right piriformis and greater sciatic notch area tenderness to palpation, no swelling, no ecchymosis, no rash, no saddle anesthesia, bilateral plantar flexion and dorsiflexion are strong and equal, ambulatory with steady gait, negative straight leg, ambulatory in room. Neurologic:  Normal speech and language. No gross focal neurologic deficits are appreciated. Speech is normal. No gait instability.  Skin:  Skin is warm, dry and intact. No rash noted. Psychiatric: Mood and affect  are normal. Speech and behavior are normal. Patient exhibits appropriate insight and judgment   ___________________________________________   LABS (all labs ordered are listed, but only abnormal results are displayed)  Labs Reviewed - No data to display ____________________________________________   PROCEDURES Procedures   INITIAL IMPRESSION / ASSESSMENT AND PLAN / ED COURSE  Pertinent labs & imaging results that were available during my care of the patient were reviewed by me and considered in my medical decision making (see chart for details).  Well-appearing patient.  No acute distress.  Right lower back pain, suspect inflammatory, intermittent sciatica.  No trauma and no midline tenderness, will defer x-ray.  Encourage stretching, patient to have physical therapy in December for her MS, discuss physical therapy for low back pain.  Will treat patient with oral Mobic and as needed Flexeril.  Encourage follow-up for continued pain.Discussed indication, risks and benefits of medications  with patient.  Discussed follow up with Primary care physician this week as needed. Discussed follow up and return parameters including no resolution or any worsening concerns. Patient verbalized understanding and agreed to plan.   ____________________________________________   FINAL CLINICAL IMPRESSION(S) / ED DIAGNOSES  Final diagnoses:  Acute right-sided low back pain with right-sided sciatica     ED Discharge Orders        Ordered    meloxicam (MOBIC) 15 MG tablet  Daily PRN     10/26/17 0849    cyclobenzaprine (FLEXERIL) 10 MG tablet  2 times daily PRN     10/26/17 0849       Note: This dictation was prepared with Dragon dictation along with smaller phrase technology. Any transcriptional errors that result from this process are unintentional.         Marylene Land, NP 10/26/17 [phone]

## 2017-10-26 NOTE — ED Triage Notes (Signed)
Patient c/o lower back pain that started couple of months ago. Patient denies injury.

## 2017-10-30 MED FILL — COPAXONE/40MG/ML/SOSY: COPAXONE/40MG/ML/SOSY | 28 days supply | Qty: 12 | Fill #1

## 2017-11-14 ENCOUNTER — Telehealth: Payer: Self-pay

## 2017-11-14 NOTE — Telephone Encounter (Signed)
Patient asking for a refill on Lexapro. Patient reported not taking on 10/18/2017 and DC by D. solwes

## 2017-11-14 NOTE — Telephone Encounter (Signed)
If patient would like to restart Lexapro, she must schedule an appointment with Dr. Ancil Boozer to discuss treatment.

## 2017-11-15 ENCOUNTER — Ambulatory Visit
Admission: RE | Admit: 2017-11-15 | Discharge: 2017-12-14 | Disposition: A | Discharge status: Home / Self Care | Payer: MEDICARE | Attending: Rehabilitative and Restorative Service Providers" | Admitting: Rehabilitative and Restorative Service Providers"

## 2017-11-15 ENCOUNTER — Ambulatory Visit
Admit: 2017-11-15 | Discharge: 2017-12-14 | Payer: MEDICARE | Attending: Rehabilitative and Restorative Service Providers" | Primary: Rehabilitative and Restorative Service Providers"

## 2017-11-15 DIAGNOSIS — Z880 Allergy status to penicillin: Secondary | ICD-10-CM | POA: Diagnosis not present

## 2017-11-15 DIAGNOSIS — I10 Essential (primary) hypertension: Secondary | ICD-10-CM | POA: Diagnosis not present

## 2017-11-15 DIAGNOSIS — M545 Low back pain: Secondary | ICD-10-CM | POA: Diagnosis not present

## 2017-11-15 DIAGNOSIS — G43909 Migraine, unspecified, not intractable, without status migrainosus: Secondary | ICD-10-CM | POA: Diagnosis not present

## 2017-11-15 DIAGNOSIS — G35 Multiple sclerosis: Secondary | ICD-10-CM | POA: Diagnosis not present

## 2017-11-15 DIAGNOSIS — Z79899 Other long term (current) drug therapy: Secondary | ICD-10-CM | POA: Diagnosis not present

## 2017-11-15 DIAGNOSIS — M79604 Pain in right leg: Secondary | ICD-10-CM | POA: Diagnosis not present

## 2017-11-15 DIAGNOSIS — Z7409 Other reduced mobility: Secondary | ICD-10-CM

## 2017-11-15 DIAGNOSIS — R52 Pain, unspecified: Secondary | ICD-10-CM

## 2017-11-15 DIAGNOSIS — R2689 Other abnormalities of gait and mobility: Secondary | ICD-10-CM

## 2017-11-15 NOTE — Unmapped (Signed)
NEUROLOGICAL OUTPATIENT PHYSICAL THERAPY   EVALUATION AND PLAN OF CARE    Patient Name: Hannah Barr  Date of Birth:[birth year]  Date: 11/15/2017  Session Number:  1  Therapy Diagnosis:   Encounter Diagnoses   Name Primary?   ??? Multiple sclerosis (CMS-HCC)    ??? Pain Yes   ??? Imbalance    ??? Decreased functional mobility and endurance        Date of Injury/Onset: MS diagnosed in 2012; Right leg pain for 6 months    Date of Evaluation: 11/15/2017  Referring Pracitioner: Hannah Jolly PA  Certification Dates: 11/15/17 to 12/20/16    ASSESSMENT:    46 y.o. female diagnosed with Multiple Sclerosis in 2012 presents with right lower back and leg pain, fatigue and imbalance which is limiting her ability to be totally fully participate in and be independent with ADLs, IADLs  and exercise.  The patient has been experiencing right sided LBP and right leg pain which travels to her anterior thigh and ends just proximal to her knee. This pain was eliminated with pone lying and double knee to chest, but returned when the patient sat up and returned to activity.Palpation of her right paraspinal muscles exacerbates her pain.Both the slump and SLR tests were negative.  A HEP of low back stretching was initiated today.The patient did not lose her balance throughout the mCTSIB but did demonstrate increased sway on both eyes closed conditions. The patient's Functional Gait Assessment Score of 25/30 is not indicative of falls risk but she did have difficulty walking with vertical head movement as well as walking with her eyes closed and backwards. These challenges in addition to being challenged during eyes closed conditions may be indicative of decreased ability to interpret vestibular cues for balance control. Mas related fatigue also appears to impact the patient's life as noted by her MFIS score of  51/82. During future appointments will progress the patient's HEP to include further stretching, core strengthening, balance/vestibular exercises and interval aerobic exercise for helping to manage MS related fatigue.   Patient will benefit from skilled Physical Therapy services for decreasing pain, improving fatigue and improving her quality of life..     Problem List: decreased functional strength strength, impaired balance, deconditioning and pain    Prognosis:  Good   Positive Indicators: age, good caregiver/family support and good safety awareness   Negative Indicators: endurance deficits, functional strength deficits, impaired balance and pain    Personal Factors/Comorbidities Present: HTN. Pain, MS    Examination of Body Systems: ROM, strength, balance, gait    Clinical Decision Making: High Complexity Eval Code:  Data from the patient???s history indicates 3 or more personal factors or comorbidities that will affect the treatment of the current problem. Examination of body systems reveals 4 or more body structures, functions, activity limitations and/or participation restrictions and the clinical presentation is unstable. The results of a standardized functional test or outcome measure indicates the clinical decision making was of high complexity.    PT G-Code:  Functional Assessment Tool Used: MMT, mCTSIB, 5X STS, Pain rating, MFIS  Functional Limitation: Mobility: Walking and moving around  Mobility: Walking and Moving Around Current Status [phone]): At least 20 percent but less than 40 percent impaired, limited or restricted  Mobility: Walking and Moving Around Goal Status [phone]): At least 1 percent but less than 20 percent impaired, limited or restricted  Rationale: Based on age , outcome measures and pain level      Goals  Patient/Family Goals: To get rid of her right low back and leg pain.     Short Term Goals:  In 3 weeks:  Patient will be able to properly demonstrate current HEP independently x1 in clinic to build upon functional gains in PT.   Patient will Patient will rate pain as consistently 6/10.    Long Term Goals:  6 weeks: Patient will be able to properly demonstrate current HEP independently x1 in clinic to build upon functional gains in PT.   Patient will rate pain as consistently 4/10 or less.  Patient will transition to community based exercise for continued wellness  Patient will tolerated 20 min of exercise to aid in management of MS related fatigue    PLAN/Recommendations: 2 x a week for 3 weeks then 1 x per week for 3 weeks  Planned Interventions: Manual Therapy  Gait Training  Therapeutic Activites  Neuromuscular Education  Self-Care  Unattended EStim  Physical Performance Test  Therapeutic exercise  Balance training  Postural exercises/education  Body mechanics/education  E-stim  Education  PT DME/Equipment Recs: TBD    SUBJECTIVE:  Pt states: that she has had right leg pain for at least 6 months. It becoming progressively worse.    Reason for Referral/History of Present Condition/Onset of injury/exacerbation:   46 y.o. female  With Multiple Sclerosis presents to the [facility] with right low back and leg pain as well as fatigue. The patient's disease modifying medicine is copoxone.     Per Hannah Barr's PA note 09/20/17   PRIOR HISTORY: A 46 y.o.  African-American female who was diagnosed with relapsing remitting multiple sclerosis 02/11/2011. She was seen as a new patient on 02/10/2011 by Dr. Denice Barr. Initial symptoms began in 12/2010. This started abruptly with occipital headaches with occasional tension pain in her neck. Two days later, she started having discrimination to light touch along the posterior aspect of her left forearm that radiated into her left shoulder. Over the next several days, her symptoms worsened. She began having numbness in her left arm. She then went to the emergency room at [facility], had MRI of the brain and C-spine, which showed demyelinating abnormalities  INTERVAL HISTORY / CHIEF COMPLAINT:   Ran out of baclofen, Provigil and Gabapentin 6 months ago. Now have pain in right hip that radiates down to the right foot. It is constant.  Having numbness and tingling on the left side that comes and goes.  Complaining of depression but no suicidal ideation.  ??  Generalized not motor fatigue: Tried on 06/21/2012 Amantadine 100 mg b.i.d.  Was not effective and she was started on Provigil. Has noticed improvement with fatigue with Provigil.    Last Fall: 7/18 when getting out of the tub.  Her leg just gets weak.  Precautions: Falls  Red Flags:  changes in bowel/bladder function-constipation    Prior Functional Status:  6 months ago has very mile right leg pain.    Current Functional Status:Moderated to severe leg pain, falls, lleft side goes numb and she can not hold things.  Currently used Equipment: has a cane but does not use it.  Previous Treatment: no  Employment/Recreation: Working part time as a Surveyor, mining.  On disability    Social History: Lives with 78 yo son in [place] in a 2 story house with o STE but 12 steps to bedroom with rail..  Drives a school bus part time  Caregiver availability, capability, willingness HHA 5 days per week for 2 hours.  2 daughters can also help  Independent w/ ADLs?: Sometimes she needs help with bath    Patient???s communication preference: Verbal, Written and Visual    Barriers to Learning: pain     Recent Procedures/Tests/Findings  Refer to EMR    Past Medical History:   Diagnosis Date   ??? Anemia    ??? Essential hypertension, benign    ??? GERD (gastroesophageal reflux disease)    ??? Migraine    ??? MS (multiple sclerosis) (CMS-HCC)      Family History   Problem Relation Age of Onset   ??? Diabetes Mother    ??? Hypertension Mother    ??? Cancer Father    ??? COPD Father        Past Surgical History:   Procedure Laterality Date   ??? CESAREAN SECTION     ??? FOOT ARTHRODESIS, MODIFIED MCBRIDE Left    ??? GANGLION CYST EXCISION      both hands      Allergies   Allergen Reactions   ??? Penicillins Hives        Social History   Substance Use Topics   ??? Smoking status: Never Smoker   ??? Smokeless tobacco: Never Used   ??? Alcohol use No      Current Outpatient Prescriptions   Medication Sig Dispense Refill   ??? amitriptyline (ELAVIL) 25 MG tablet Take 1 tablet (25 mg total) by mouth nightly as needed for sleep. 90 tablet 0   ??? cholecalciferol, vitamin D3, 4,000 unit Tab Take 4,000 Units by mouth.     ??? gabapentin (NEURONTIN) 300 MG capsule Take 2 capsules (600 mg total) by mouth Three (3) times a day. Frequency:TID 180 capsule 5   ??? glatiramer (COPAXONE) 40 mg/mL Syrg Inject 40 mg total under the skin every Monday, Wednesday, & Friday. Separate doses by at least 48 hours. 12 Syringe 5   ??? linaclotide (LINZESS) 145 mcg capsule Take 145 mcg by mouth.     ??? mesalamine (CANASA) 1000 MG suppository Insert 1,000 mg into the rectum nightly.     ??? modafinil (PROVIGIL) 100 MG tablet Frequency:BID 60 tablet 5   ??? SUMAtriptan (IMITREX) 100 MG tablet Take 100 mg by mouth.       No current facility-administered medications for this visit.             OBJECTIVE :    Vitals:  137/87   HR: 64   Posture:    Sitting: rounded shoulders, foward head and right shoulder elebated   Standing: rounded shoulders, foward heard, increased thoracic kyphosis and right shoulder elevated  Skin assessment/Edema: n/a    Pain  Current:  6/10   Max:  10/10  Least:  3/10  Nature of pain: radiating pain, stabbing at times, stiffness    Right lower back then down ant thigh to knee  Pain Frequency: constant  Pain Aggravated By: moving  Pain Relieved By: heat, leg elevation    Sensation:  Left arm numb. rates light touch 10% less left then right  LLE 10% decreased light touch.  Numbness and tingling in foot.   Vision:  Glasses with  Distance correction    Vestibular:    denies        Range of Motion:   WNL  Cervical: WNL  Lumbar Spine ROM  Motion No ROM Loss  (0%) Min ROM Loss  (1???25%) Mod ROM Loss  (26-64%) Major ROM Loss  (65-100%) Symptoms    Flexion   50%  pain   Extension  30%   pain   Sideglide Right  30%   pain   Sideglide Left  20% Strength/MMT:  BUE: 5/5 with no discomfort   LE Strength  Myotome Left Right   Hip flexion (L2) 5 5   Knee extension (L3) 5 5   Ankle dorsiflexion (L4) 5 5   Great toe extension (L5) 5 5   Ankle plantar flexion (S1) 5 5   Ankle eversion (S1) 5 5   Hip extension (S1) 5 5   Knee flexion (S2) 5 5           discomfort with resistance to right hip and knee muscles.  Slump Test: Neg    Palpation:    The pt is very tender to palpation of right lumbar paraspinal muscles from L2 to L5 with increased intensity at at L5    Motor Function/Coordination:   Normal finger to nose coordination  Normal heel to shin to knee coordination  Normal Rapid alternating movement     Reflexes/Tone/Spasticity:   No tonal changes or clonus noted    Transfers:    Supine<>Sit: Mod I painful   Sit<>Stand:Mod I -painful     Balance:  Functional Gait Assessment    Requirements: A marked 6-m (20-ft) walkway that is marked with a 30.48-cm (12-in) width.    1. GAIT LEVEL SURFACE  3  Instructions: Walk at your normal speed from here to the next mark (6 m[20 ft]).  Grading: Loraine Leriche the highest category that applies.  (3) Normal???Walks 6 m (20 ft) in less than 5.5 seconds, no assistive devices, good speed, no evidence for imbalance, normal gait  pattern, deviates no more than 15.24 cm (6 in) outside of the 30.48-cm (12-in) walkway width.  (2) Mild impairment???Walks 6 m (20 ft) in less than 7 seconds but greater than 5.5 seconds, uses assistive device, slower speed,  mild gait deviations, or deviates 15.24???25.4 cm (6???10 in) outside of the 30.48-cm (12-in) walkway width.  (1) Moderate impairment???Walks 6 m (20 ft), slow speed, abnormal gait pattern, evidence for imbalance, or deviates 25.4???  38.1 cm (10???15 in) outside of the 30.48-cm (12-in) walkway width. Requires more than 7 seconds to ambulate 6 m (20 ft).  (0) Severe impairment???Cannot walk 6 m (20 ft) without assistance, severe gait deviations or imbalance, deviates greater than 38.1  cm (15 in) outside of the 30.48-cm (12-in) walkway width or reaches and touches the wall.    2. CHANGE IN GAIT SPEED 3  Instructions: Begin walking at your normal pace (for 1.5 m [5 ft]). When I tell you ???go,??? walk as fast as you can (for 1.5 m [5 ft]). When I tell you ???slow,??? walk as slowly as you can (for 1.5 m [5 ft]).  Grading: Loraine Leriche the highest category that applies.  (3) Normal???Able to smoothly change walking speed without loss of balance or gait deviation. Shows a significant difference in  walking speeds between normal, fast, and slow speeds. Deviates no more than 15.24 cm (6 in) outside of the 30.48-cm (12-in) walkway width.  (2) Mild impairment???Is able to change speed but demonstrates mild gait deviations, deviates 15.24???25.4 cm (6???10 in) outside  of the 30.48-cm (12-in) walkway width, or no gait deviations but unable to achieve a significant change in velocity, or uses an  assistive device.  (1) Moderate impairment???Makes only minor adjustments to walking speed, or accomplishes a change in speed with significant  gait deviations, deviates 25.4???38.1 cm (10???15 in) outside the  30.48-cm (12-in) walkway width, or changes speed but loses balance but is able to recover and continue walking.  (0) Severe impairment???Cannot change speeds, deviates greater than 38.1 cm (15 in) outside 30.48-cm (12-in) walkway width,  or loses balance and has to reach for wall or be caught.    3. GAIT WITH HORIZONTAL HEAD TURNS 3  Instructions: Walk from here to the next mark 6 m (20 ft) away. Begin walking at your normal pace. Keep walking straight; after 3 steps, turn your head to the right and keep walking straight while looking to the right. After 3 more steps, turn your head to the left and keep walking straight while looking left. Continue alternating looking right and left every 3 steps until you have completed 2 repetitions in each direction.  Grading: Loraine Leriche the highest category that applies.  (3) Normal???Performs head turns smoothly with no change in gait. Deviates no more than 15.24 cm (6 in) outside 30.48-cm (12-in)  walkway width.  (2) Mild impairment???Performs head turns smoothly with slight change in gait velocity (eg, minor disruption to smooth gait path), deviates 15.24???25.4 cm (6???10 in) outside 30.48-cm (12-in) walkway width, or uses an assistive device.  (1) Moderate impairment???Performs head turns with moderate change in gait velocity, slows down, deviates 25.4???38.1 cm  (10???15 in) outside 30.48-cm (12-in) walkway width but recovers,  can continue to walk.  (0) Severe impairment???Performs task with severe disruption of gait (eg, staggers 38.1 cm [15 in] outside 30.48-cm (12-in) walkway  width, loses balance, stops, or reaches for wall).    4. GAIT WITH VERTICAL HEAD TURNS 2  Instructions: Walk from here to the next mark (6 m [20 ft]). Begin walking at your normal pace. Keep walking straight; after 3 steps, tip your head up and keep walking straight while looking up. After 3 more steps, tip your head down, keep walking straight while looking down. Continue alternating looking up and down every 3 steps until you have completed 2 repetitions in each direction.  Grading: Loraine Leriche the highest category that applies.  (3) Normal???Performs head turns with no change in gait. Deviates no more than 15.24 cm (6 in) outside 30.48-cm (12-in) walkway  width.  (2) Mild impairment???Performs task with slight change in gait velocity (eg, minor disruption to smooth gait path), deviates 15.24???25.4 cm (6???10 in) outside 30.48-cm (12-in) walkway width or uses assistive device.  (1) Moderate impairment???Performs task with moderate change in gait velocity, slows down, deviates 25.4???38.1 cm (10???15 in)  outside 30.48-cm (12-in) walkway width but recovers, can continue to walk.  (0) Severe impairment???Performs task with severe disruption of gait (eg, staggers 38.1 cm [15 in] outside 30.48-cm (12-in) walkway  width, loses balance, stops, reaches for wall).    5. GAIT AND PIVOT TURN 3 Instructions: Begin with walking at your normal pace. When I tell you, ???turn and stop,??? turn as quickly as you can to face the opposite direction and stop.  Grading: Loraine Leriche the highest category that applies.  (3) Normal???Pivot turns safely within 3 seconds and stops quickly with no loss of balance.  (2) Mild impairment???Pivot turns safely in _3 seconds and stops with no loss of balance, or pivot turns safely within 3 seconds  and stops with mild imbalance, requires small steps to catch balance.  (1) Moderate impairment???Turns slowly, requires verbal cueing, or requires several small steps to catch balance following turn and  stop.  (0)Severe impairment???Cannot turn safely, requires assistance to turn and stop.    6. STEP OVER OBSTACLE 3  Instructions: Begin walking at your normal speed. When you come to the shoe box, step over it, not around it, and keep walking.  Grading: Loraine Leriche the highest category that applies.  (3) Normal???Is able to step over 2 stacked shoe boxes taped together (22.86 cm [9 in] total height) without changing gait speed; no evidence of imbalance.  (2) Mild impairment???Is able to step over one shoe box (11.43 cm [4.5 in] total height) without changing gait speed; no evidence  of imbalance.  (1) Moderate impairment???Is able to step over one shoe box (11.43 cm [4.5 in] total height) but must slow down and adjust steps to  clear box safely. May require verbal cueing.  (0) Severe impairment???Cannot perform without assistance.     7. GAIT WITH NARROW BASE OF SUPPORT 1  Instructions: Walk on the floor with arms folded across the chest, feet aligned heel to toe in tandem for a distance of 3.6 m [12 ft]. The number of steps taken in a straight line are counted for a maximum of 10 steps.  Grading: Loraine Leriche the highest category that applies.  (3) Normal???Is able to ambulate for 10 steps heel to toe with no staggering.  (2) Mild impairment???Ambulates 7???9 steps.  (1) Moderate impairment???Ambulates 4???7 steps.  (0) Severe impairment???Ambulates less than 4 steps heel to toe or cannot perform without assistance.    8. GAIT WITH EYES CLOSED 2  Instructions: Walk at your normal speed from here to the next mark (6 m[20 ft]) with your eyes closed.  Grading: Loraine Leriche the highest category that applies.  (3) Normal???Walks 6 m (20 ft), no assistive devices, good speed, no evidence of imbalance, normal gait pattern, deviates no more  than 15.24 cm (6 in) outside 30.48-cm (12-in) walkway width. Ambulates 6 m (20 ft) in less than 7 seconds.  (2) Mild impairment???Walks 6 m (20 ft), uses assistive device, slower speed, mild gait deviations, deviates 15.24???25.4 cm  (6???10 in) outside 30.48-cm (12-in) walkway width. Ambulates 6 m (20 ft) in less than 9 seconds but greater than 7 seconds.  (1) Moderate impairment???Walks 6 m (20 ft), slow speed, abnormal gait pattern, evidence for imbalance, deviates 25.4???38.1  cm (10???15 in) outside 30.48-cm (12-in) walkway width. Requires more than 9 seconds to ambulate 6 m (20 ft).  (0) Severe impairment???Cannot walk 6 m (20 ft) without assistance, severe gait deviations or imbalance, deviates greater than 38.1  cm (15 in) outside 30.48-cm (12-in) walkway width or will not attempt task.    9. AMBULATING BACKWARDS 2  Instructions: Walk backwards until I tell you to stop.  Grading: Loraine Leriche the highest category that applies.  (3) Normal???Walks 6 m (20 ft), no assistive devices, good speed, no evidence for imbalance, normal gait pattern, deviates no  more than 15.24 cm (6 in) outside 30.48-cm (12-in) walkway width.  (2) Mild impairment???Walks 6 m (20 ft), uses assistive device, slower speed, mild gait deviations, deviates 15.24???25.4 cm (6???10 in) outside 30.48-cm (12-in) walkway width.  (1) Moderate impairment???Walks 6 m (20 ft), slow speed, abnormal gait pattern, evidence for imbalance, deviates 25.4???38.1  cm (10???15 in) outside 30.48-cm (12-in) walkway width.  (0) Severe impairment???Cannot walk 6 m (20 ft) without assistance, severe gait deviations or imbalance, deviates greater than 38.1  cm (15 in) outside 30.48-cm (12-in) walkway width or will not attempt task.    10. STEPS 3  Instructions: Walk up these stairs as you would at home (ie, using the rail if necessary). At the top turn around and walk  down.  Grading: Mark the highest category that applies.  (3) Normal???Alternating feet, no rail.  (2) Mild impairment???Alternating feet, must use rail.  (1) Moderate impairment???Two feet to a stair; must use rail.  (0) Severe impairment???Cannot do safely.    TOTAL SCORE: 25    MAXIMUM SCORE 30    Physical Therapy . Volume 84 . Number 10 . October 2004 Wrisley et al . [id number]    Gait Analysis:   Ambulates with good cadence using an alternating gait pattern.  Decreased right arm swing noted.  Right shoulder elevation is also noted.      Functional Tests/Outcome Measures:   5X STS: 12.64 sec  MCTSIB: FT eo: 30 sec: FT ec: 30 sec                  FT eo on airex cushion: 30 sec : FT ec on airex cushion with Mod sway: 30 sec   MFIS:  Total Score 51/82  Physical Subscale: 26/36  Cognitive Subscale: 20/40  Psychosocial Subscale: 6/8    Patient Education: Throughout evaluation patient educated regarding the following: Role of PT in Rehabilitation, HEP, importance of therapy, equipment recommendations, posture, body mechanics, body awareness, treatment plan, back care, Indications/Contraindications to Exercises and [employer]. Patient demonstrated and verbalized agreement and understanding.        Communication/consultation with other professionals:  discussed POC with PT   medicare Cert/POC sent to referring practitioner    Total Time: 65 min   PT Evaluation:40 min   Treatment Rendered:    Therapeutic Exercise: 10 mins - Home Exercise Program and Stretching  -Prone lying x 1 min; Attempted prone propping on forearms but pt was too uncomfortable  -supine knees to chest x 1 min  -trunk rotation right and left  30 sec each direction  Instructed the pt to perform 1 rep of each exercise 10x per day  Explained the benefit of stretching and the importance of a prolonged, steady stretch   Provided the pt with a printed handout of above exercises.  Self Care: 10 mins  -Discussed the impact of fatigue on her overall  And how exercise can potentially help with MS related fatigue.  -Encouraged her to use moist heat for 15 to 20 min prior to stretching.  Cautioned her against lying on the moist heat or sleeping with it in order to avoid burns.  -Encouraged the patient to walk daily for exercise or [facility]  -Worked on proper mechanics during sit<>supine transition ( sidelying and pushing with BUE vs sitting straight up) in an effort not to increase low back discomfort.   -discussed appropriate hydration  -Educated the pt as to how diet, hydration and exercise can positively impact her constipation    I attest that I have reviewed the above information.  SignedLeona Singleton, PT  11/15/2017 11:07 AM

## 2017-11-25 ENCOUNTER — Other Ambulatory Visit: Payer: Self-pay

## 2017-11-25 DIAGNOSIS — F331 Major depressive disorder, recurrent, moderate: Secondary | ICD-10-CM

## 2017-11-25 NOTE — Progress Notes (Unsigned)
Refill request for general medication: Lexapro 10 mg  Last office visit: 10/18/2017  Last physical exam: 05/24/2017  No future appointments.

## 2017-11-28 MED ORDER — ESCITALOPRAM OXALATE 10 MG PO TABS: 10.0000 mg | ORAL_TABLET | Freq: Every day | ORAL | 0 refills | Status: DC

## 2017-11-28 NOTE — Progress Notes (Signed)
Called patient @ 8:49 [phone] and left vm to inform her that Lexapro was sent to pharmacy for 30 tablets and 0 refills. She will need to schedule a follow up for additional refills.

## 2017-11-28 NOTE — Progress Notes (Unsigned)
I am sending 30 days, but she needs follow up

## 2017-12-05 NOTE — Unmapped (Signed)
[facility] Refill Coordination Note  Specialty Medication(s): COPAXONE      Hannah Barr, DOB: 06-Aug-1971  Phone: [phone] (home) , Alternate phone contact: N/A  Phone or address changes today?: No  All above HIPAA information was verified with patient.  Shipping Address: [address]  [place] [address]   Insurance changes? No    Completed refill call assessment today to schedule patient's medication shipment from the [facility] [phone]).      Confirmed the medication and dosage are correct and have not changed: Yes, regimen is correct and unchanged.    Confirmed patient started or stopped the following medications in the past month:  No, there are no changes reported at this time.    Are you tolerating your medication?:  Hannah Barr reports tolerating the medication.    ADHERENCE    (Below is required for Medicare Part B or Transplant patients only - per drug):   How many tablets were dispensed last month: 12  Patient currently has 1 OR 2 remaining.    Did you miss any doses in the past 4 weeks? No missed doses reported.    FINANCIAL/SHIPPING    Delivery Scheduled: Yes, Expected medication delivery date: 12/07/17     Hannah Barr did not have any additional questions at this time.    Delivery address validated in [facility] scheduling system: Yes, address listed in [facility] is correct.    We will follow up with patient monthly for standard refill processing and delivery.      Thank you,  Westley Gambles   [facility] Specialty Technician

## 2017-12-06 ENCOUNTER — Ambulatory Visit (INDEPENDENT_AMBULATORY_CARE_PROVIDER_SITE_OTHER): Payer: Medicare HMO | Admitting: Family Medicine

## 2017-12-06 ENCOUNTER — Ambulatory Visit
Admission: RE | Admit: 2017-12-06 | Discharge: 2017-12-06 | Disposition: A | Discharge status: Home / Self Care | Payer: Medicare HMO | Source: Ambulatory Visit | Attending: Family Medicine | Admitting: Family Medicine

## 2017-12-06 ENCOUNTER — Encounter: Payer: Self-pay | Admitting: Family Medicine

## 2017-12-06 VITALS — BP 124/86 | HR 78 | Temp 98.3°F | Resp 16 | Ht 62.0 in | Wt 166.1 lb

## 2017-12-06 DIAGNOSIS — M5431 Sciatica, right side: Secondary | ICD-10-CM

## 2017-12-06 DIAGNOSIS — M25551 Pain in right hip: Secondary | ICD-10-CM | POA: Diagnosis not present

## 2017-12-06 DIAGNOSIS — I10 Essential (primary) hypertension: Secondary | ICD-10-CM

## 2017-12-06 DIAGNOSIS — Z1231 Encounter for screening mammogram for malignant neoplasm of breast: Secondary | ICD-10-CM

## 2017-12-06 DIAGNOSIS — E669 Obesity, unspecified: Secondary | ICD-10-CM | POA: Insufficient documentation

## 2017-12-06 DIAGNOSIS — Z1239 Encounter for other screening for malignant neoplasm of breast: Secondary | ICD-10-CM

## 2017-12-06 DIAGNOSIS — G35 Multiple sclerosis: Secondary | ICD-10-CM | POA: Diagnosis not present

## 2017-12-06 DIAGNOSIS — E8881 Metabolic syndrome: Secondary | ICD-10-CM | POA: Diagnosis not present

## 2017-12-06 DIAGNOSIS — N63 Unspecified lump in unspecified breast: Secondary | ICD-10-CM

## 2017-12-06 MED ORDER — LIRAGLUTIDE -WEIGHT MANAGEMENT 18 MG/3ML ~~LOC~~ SOPN: 0.6000 mg | PEN_INJECTOR | Freq: Every day | SUBCUTANEOUS | 0 refills | Status: DC

## 2017-12-06 MED ORDER — MELOXICAM 15 MG PO TABS: 7.5000 mg | ORAL_TABLET | Freq: Every day | ORAL | 0 refills | Status: DC

## 2017-12-06 MED FILL — COPAXONE/40MG/ML/SOSY: COPAXONE/40MG/ML/SOSY | 28 days supply | Qty: 12 | Fill #2

## 2017-12-06 NOTE — Patient Instructions (Addendum)
Go across street to [facility] for Hip Xray. Mammogram and Ultrasound of breast is ordered. Take Meloxicam once daily as needed for your right hip pain.  Start physical therapy. Keep your follow up appointments with your Neurologist.

## 2017-12-06 NOTE — Progress Notes (Signed)
Name: Tammie Lopez   MRN: [id number]    DOB: 1971/09/24   Date:12/06/2017       Progress Note  Subjective  Chief Complaint  Chief Complaint  Patient presents with  . Breast Problem    left breast swollen for 1 week  . Follow-up    back pain, seen at [facility] in November  . Weight Check    HPI  Back Pain: Ongoing for 6-8 months - radiates down the RLE, she reports tenderness over the right hip and this has caused her a lot of concern - would like an Xray today.  Endorses stiffness after sitting for long periods of time, endorses some occasional weakness of the RLE, some intermittent numbness/tingling to RLE. Was seen at [facility] on 10/26/2017 and was diagnosed with RIGHT sciatica/low back pain.  She does have MS and questions if this is contributing.  She took short course of meloxicam after her UC visit and this did help, but she ran out of the medication. Has first PT appointment tomorrow for her MS - she was referred by her Neurologist.  Breast Pain/Swelling: Patient reports LEFT breast swelling about 1-2 weeks ago.  She denies pain except if she lays on it the wrong way.  She does note that her LEFT side sometimes swells when her MS is flaring.  She does keep close follow up with Dr. Varney Daily (Neurology).  She also questions if this is related to her recent weight gain.  She has never had a mammogram because she is scared of the pain during the imaging.  Denies nipple discharge/changes, redness, skin changes.  Weight Management: She has been seen before for the same by PCP Dr. Ancil Boozer and prescribed saxenda.  She never started it because it was too costly due to insurance not covering because her BMI was not in the obesity category.  Today her BMI is 30.38, so she would like this to be re-ordered. No history of pancreatitis or thyroid cancer, no family history of thyroid cancer. We will re-order today.  She joined the [employer] - she gets on the treadmill or walks the track, is considering starting in  the swimming pool.  She has been cutting back on sugars, stopped sodas, stopped some bread, working to increase vegetable intake.  We will refer to nutritionist as well.   Patient Active Problem List   Diagnosis Date Noted  . Insulin resistance 09/27/2017  . Essential hypertension 09/21/2017  . Right sided sciatica 09/21/2017  . Major depression, recurrent ([place]) 05/24/2017  . Migraine without aura, not intractable 12/09/2016  . Obese 12/09/2016  . Nickel allergy 12/09/2016  . GERD without esophagitis 12/09/2016  . Leukopenia 12/09/2016  . Dyslipidemia 12/09/2016  . Diverticulosis 12/09/2016  . 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 Major depression in remission ([place]) 12/09/2016  . Allergic rhinitis, seasonal 12/09/2016  . Chronic constipation 12/09/2016  . Vitamin D deficiency disease 08/06/2013  . MS (multiple sclerosis) ([place]) 04/17/2013  . History of hypertension 06/21/2012    Social History   Tobacco Use  . Smoking status: Never Smoker  . Smokeless tobacco: Never Used  Substance Use Topics  . Alcohol use: No     Current Outpatient Medications:  .  amitriptyline (ELAVIL) 25 MG tablet, Take 1 tablet by mouth as needed for sleep., Disp: , Rfl: 2 .  amLODipine (NORVASC) 5 MG tablet, Take 0.5-1 tablets (2.5-5 mg total) by mouth daily., Disp: 60 tablet, Rfl: 0 .  cyclobenzaprine (FLEXERIL) 10 MG tablet, Take 1 tablet (  10 mg total) by mouth 2 (two) times daily as needed for muscle spasms. Do not drive while taking as can cause drowsiness, Disp: 15 tablet, Rfl: 0 .  dicyclomine (BENTYL) 10 MG capsule, Take 1 capsule (10 mg total) by mouth 3 (three) times daily as needed for spasms., Disp: 20 capsule, Rfl: 0 .  escitalopram (LEXAPRO) 10 MG tablet, Take 1 tablet (10 mg total) by mouth daily., Disp: 30 tablet, Rfl: 0 .  gabapentin (NEURONTIN) 300 MG capsule, Take 600 mg by mouth 3 (three) times daily., Disp: , Rfl:  .  Glatiramer Acetate (COPAXONE) 40 MG/ML SOSY, Inject 40 mg into the skin 3  (three) times a week., Disp: , Rfl:  .  hydrochlorothiazide (HYDRODIURIL) 25 MG tablet, Take 1 tablet (25 mg total) by mouth daily., Disp: 30 tablet, Rfl:  .  linaclotide (LINZESS) 145 MCG CAPS capsule, Take 1 capsule (145 mcg total) by mouth daily., Disp: 30 capsule, Rfl: 5 .  modafinil (PROVIGIL) 100 MG tablet, Take 1 tablet by mouth daily., Disp: , Rfl:  .  ondansetron (ZOFRAN) 4 MG tablet, Take 1 tablet (4 mg total) by mouth every 8 (eight) hours as needed for nausea or vomiting., Disp: 20 tablet, Rfl: 0 .  SUMAtriptan (IMITREX) 100 MG tablet, Take 1 tablet (100 mg total) by mouth every 2 (two) hours as needed for migraine. May repeat in 2 hours if headache persists or recurs., Disp: 9 tablet, Rfl: 1 .  traZODone (DESYREL) 50 MG tablet, Take 0.5-1 tablets (25-50 mg total) by mouth at bedtime as needed for sleep., Disp: 30 tablet, Rfl: 3 .  meloxicam (MOBIC) 15 MG tablet, Take 1 tablet (15 mg total) by mouth daily as needed. (Patient not taking: Reported on 12/06/2017), Disp: 10 tablet, Rfl: 0  Allergies  Allergen Reactions  . Penicillins Hives    ROS  Constitutional: Negative for fever or weight change.  Respiratory: Negative for cough and shortness of breath.   Cardiovascular: Negative for chest pain or palpitations.  Gastrointestinal: Negative for abdominal pain, no bowel changes.  Musculoskeletal: Negative for gait problem or joint swelling. See HPI Skin: Negative for rash.  Neurological: Negative for dizziness or headache. See HPI No other specific complaints in a complete review of systems (except as listed in HPI above).  Objective  Vitals:   12/06/17 0901  BP: 124/86  Pulse: 78  Resp: 16  Temp: 98.3 F (36.8 C)  SpO2: 96%  Weight: 166 lb 1.6 oz (75.3 kg)  Height: 5\' 2"  (1.575 m)   Body mass index is 30.38 kg/m.  Nursing Note and Vital Signs reviewed.  Physical Exam  Constitutional: Patient appears well-developed and well-nourished. Obese No distress.  HEENT:  head atraumatic, normocephalic Cardiovascular: Normal rate, regular rhythm, S1/S2 present.  No murmur or rub heard. No BLE edema. Pulmonary/Chest: Effort normal and breath sounds clear. No respiratory distress or retractions. Psychiatric: Patient has a normal mood and affect. behavior is normal. Judgment and thought content normal Musculoskeletal: Normal range of motion, no joint effusions. No gross deformities.  Tenderness to RIGHT hip, stiffness at initial mobilization after sitting for several minutes. No spinal tenderness, no hip instability, gait is steady. Neurological: she is alert and oriented to person, place, and time. No cranial nerve deficit. Coordination, balance, strength, speech and gait are normal.  Skin: Skin is warm and dry. No rash noted. No erythema.  Breast: no lumps or masses, no nipple discharge or rashes.  LEFT breast is larger than the right, no erythema,  non-tender, no peau d'orange or other skin abnormalities. Nipples normal without discharge.   Recent Results (from the past 2160 hour(s))  COMPLETE METABOLIC PANEL WITH GFR     Status: None   Collection Time: 09/21/17 11:13 AM  Result Value Ref Range   Glucose, Bld 88 65 - 139 mg/dL    Comment: .        Non-fasting reference interval .    BUN 8 7 - 25 mg/dL   Creat 0.68 0.50 - 1.10 mg/dL   GFR, Est Non African American 106 > OR = 60 mL/min/1.80m2   GFR, Est African American 122 > OR = 60 mL/min/1.81m2   BUN/Creatinine Ratio NOT APPLICABLE 6 - 22 (calc)   Sodium 140 135 - 146 mmol/L   Potassium 4.1 3.5 - 5.3 mmol/L   Chloride 103 98 - 110 mmol/L   CO2 31 20 - 32 mmol/L   Calcium 9.3 8.6 - 10.2 mg/dL   Total Protein 7.0 6.1 - 8.1 g/dL   Albumin 4.4 3.6 - 5.1 g/dL   Globulin 2.6 1.9 - 3.7 g/dL (calc)   AG Ratio 1.7 1.0 - 2.5 (calc)   Total Bilirubin 0.4 0.2 - 1.2 mg/dL   Alkaline phosphatase (APISO) 35 33 - 115 U/L   AST 11 10 - 35 U/L   ALT 7 6 - 29 U/L  CBC w/Diff/Platelet     Status: None   Collection  Time: 09/21/17 11:13 AM  Result Value Ref Range   WBC 3.8 3.8 - 10.8 Thousand/uL   RBC 4.33 3.80 - 5.10 Million/uL   Hemoglobin 12.0 11.7 - 15.5 g/dL   HCT 35.5 35.0 - 45.0 %   MCV 82.0 80.0 - 100.0 fL   MCH 27.7 27.0 - 33.0 pg   MCHC 33.8 32.0 - 36.0 g/dL   RDW 12.4 11.0 - 15.0 %   Platelets 285 140 - 400 Thousand/uL   MPV 10.9 7.5 - 12.5 fL   Neutro Abs 1,664 1,500 - 7,800 cells/uL   Lymphs Abs 1,615 850 - 3,900 cells/uL   WBC mixed population 361 200 - 950 cells/uL   Eosinophils Absolute 141 15 - 500 cells/uL   Basophils Absolute 19 0 - 200 cells/uL   Neutrophils Relative % 43.8 %   Total Lymphocyte 42.5 %   Monocytes Relative 9.5 %   Eosinophils Relative 3.7 %   Basophils Relative 0.5 %  TSH     Status: None   Collection Time: 09/21/17 11:15 AM  Result Value Ref Range   TSH 0.55 mIU/L    Comment:           Reference Range .           > or = 20 Years  0.40-4.50 .                Pregnancy Ranges           First trimester    0.26-2.66           Second trimester   0.55-2.73           Third trimester    0.43-2.91      Assessment & Plan  1. Obesity (BMI 30-39.9) - Keep PT appointment - starts tomorrow.  Continue to exercise as tolerated and as able - highly recommend aquatics as this is low impact.  Discussed several medication options including contrave and belviq which would likely be less expensive - however, patient has documented insulin resistance, so Kirke Shaggy is the ideal  choice. In addition, she has HTN which could be affected by Contrave.  We will order [place] today and work with insurance for any prior auth that needs to be done. - Amb ref to Medical Nutrition Therapy-MNT - Liraglutide -Weight Management (SAXENDA) 18 MG/3ML SOPN; Inject 0.6 mg into the skin daily.  Dispense: 2 pen; Refill: 0  2. Insulin resistance - Amb ref to Medical Nutrition Therapy-MNT - Liraglutide -Weight Management (SAXENDA) 18 MG/3ML SOPN; Inject 0.6 mg into the skin daily.  Dispense: 2  pen; Refill: 0 - Congratulated patient on stopping sodas and decreasing carbohydrate intake - see above discussion for additional details.  3. Essential hypertension - See above discussion regarding weight management.  Controlled today. - Liraglutide -Weight Management (SAXENDA) 18 MG/3ML SOPN; Inject 0.6 mg into the skin daily.  Dispense: 2 pen; Refill: 0  4. Right sided sciatica - Keep PT appointment - starts tomorrow. - meloxicam (MOBIC) 15 MG tablet; Take 0.5-1 tablets (7.5-15 mg total) by mouth daily.  Dispense: 30 tablet; Refill: 0 - Advised to take with food and only as needed, not meant to be a daily medication. - DG HIP UNILAT WITH PELVIS 2-3 VIEWS RIGHT; Future - Discussed conservative therapy including NSAIDS and PT, advised that it is reassuring that Meloxicam helped her pain.  However, patient is very concerned because she feels that she has bony tenderness on the right hip and prefers to have Xray performed.  5. Tenderness of right hip joint - Keep PT appointment - starts tomorrow. - meloxicam (MOBIC) 15 MG tablet; Take 0.5-1 tablets (7.5-15 mg total) by mouth daily.  Dispense: 30 tablet; Refill: 0 - Advised to take with food and only as needed, not meant to be a daily medication. - DG HIP UNILAT WITH PELVIS 2-3 VIEWS RIGHT; Future - Discussed conservative therapy including NSAIDS and PT, advised that it is reassuring that Meloxicam helped her pain.  However, patient is very concerned because she feels that she has bony tenderness on the right hip and prefers to have Xray performed.  6. Swelling of breast - Advised this may be a normal variant of asymmetry that has become more pronounced with weight gain. We will perform diagnostic testing per orders.  Pt has never had a mammogram or other breast imaging.  Advised that if she notices worsening asymmetry, she needs to return to clinic and notify her neurologist as LEFT-sided swelling has been a symptom of MS flare in the past. - MM  Digital Diagnostic Bilat; Future - US BREAST LTD UNI LEFT INC AXILLA; Future - US BREAST LTD UNI RIGHT INC AXILLA; Future  7. Breast cancer screening - MM Digital Diagnostic Bilat; Future - US BREAST LTD UNI LEFT INC AXILLA; Future - US BREAST LTD UNI RIGHT INC AXILLA; Future  8. MS (multiple sclerosis) ([place]) - Keep follow up appointments with Neurologist, start PT as ordered.

## 2017-12-07 DIAGNOSIS — R2689 Other abnormalities of gait and mobility: Secondary | ICD-10-CM

## 2017-12-07 DIAGNOSIS — Z7409 Other reduced mobility: Secondary | ICD-10-CM

## 2017-12-07 DIAGNOSIS — G35 Multiple sclerosis: Principal | ICD-10-CM

## 2017-12-07 DIAGNOSIS — R52 Pain, unspecified: Secondary | ICD-10-CM

## 2017-12-07 NOTE — Unmapped (Signed)
NEUROLOGICAL OUTPATIENT PHYSICAL THERAPY   DAILY NOTE    Patient Name: Hannah Barr  Date of Birth:[birth year]  Date: 12/07/2017  Session Number:  2  Therapy Diagnosis:   Encounter Diagnoses   Name Primary?   ??? Multiple sclerosis (CMS-HCC) Yes   ??? Pain    ??? Imbalance    ??? Decreased functional mobility and endurance        Date of Injury/Onset: MS diagnosed in 2012; Right leg pain for 6 months    Date of Evaluation: 12/07/2017  Referring Pracitioner: Yolande Jolly Barr  Certification Dates: 11/15/17 to 12/20/16    ASSESSMENT:    47 y.o. female diagnosed with Multiple Sclerosis in 2012 presents with right lower back and leg pain, fatigue and imbalance which is limiting her ability to be totally fully participate in and be independent with ADLs, IADLs  and exercise.  Today's session focused on provided HEP to relieve back pain and improve core strengthening. Printed handout of instructions of hamstring and child's pose stretches provided as well as supine hip flexion and single leg raise to improve core strength. Pt attempted to perform bridging but could not tolerate position. Pt also received STM to lumbar and thoracic musculature to reduce pain. Pt denied pain at end of session. Pt also saw MD yesterday and received pain medication.  During future appointments will progress the patient's HEP to include further stretching, core strengthening, balance/vestibular exercises and interval aerobic exercise for helping to manage MS related fatigue.   Patient will benefit from skilled Physical Therapy services for decreasing pain, improving fatigue and improving her quality of life.    Problem List: decreased functional strength strength, impaired balance, deconditioning and pain    Prognosis:  Good   Positive Indicators: age, good caregiver/family support and good safety awareness   Negative Indicators: endurance deficits, functional strength deficits, impaired balance and pain    Personal Factors/Comorbidities Present: HTN. Pain, MS    Examination of Body Systems: ROM, strength, balance, gait    Clinical Decision Making: High Complexity Eval Code:  Data from the patient???s history indicates 3 or more personal factors or comorbidities that will affect the treatment of the current problem. Examination of body systems reveals 4 or more body structures, functions, activity limitations and/or participation restrictions and the clinical presentation is unstable. The results of a standardized functional test or outcome measure indicates the clinical decision making was of high complexity.    PT G-Code:         Goals    Patient/Family Goals: To get rid of her right low back and leg pain.     Short Term Goals:  In 3 weeks:  Patient will be able to properly demonstrate current HEP independently x1 in clinic to build upon functional gains in PT.   Patient will Patient will rate pain as consistently 6/10.    Long Term Goals:  6 weeks:  Patient will be able to properly demonstrate current HEP independently x1 in clinic to build upon functional gains in PT.   Patient will rate pain as consistently 4/10 or less.  Patient will transition to community based exercise for continued wellness  Patient will tolerated 20 min of exercise to aid in management of MS related fatigue    PLAN/Recommendations: 2 x a week for 3 weeks then 1 x per week for 3 weeks  Planned Interventions: Manual Therapy  Gait Training  Therapeutic Activites  Neuromuscular Education  Self-Care  Unattended EStim  Physical Performance Test  Therapeutic exercise  Balance training  Postural exercises/education  Body mechanics/education  E-stim  Education  PT DME/Equipment Recs: TBD    SUBJECTIVE:  Pt states: that she went to MD yesterday and was given medication to relieve the pain for lower back. Pt reports that pain today is a 3/10. She has been performing the exercises given last session, but admits not consistently.     Reason for Referral/History of Present Condition/Onset of injury/exacerbation:   47 y.o. female  With Multiple Sclerosis presents to the [facility] with right low back and leg pain as well as fatigue. The patient's disease modifying medicine is copoxone.     Per Hannah Barr note 09/20/17   PRIOR HISTORY: A 47 y.o.  African-American female who was diagnosed with relapsing remitting multiple sclerosis 02/11/2011. She was seen as a new patient on 02/10/2011 by Dr. Denice Bors. Initial symptoms began in 12/2010. This started abruptly with occipital headaches with occasional tension pain in her neck. Two days later, she started having discrimination to light touch along the posterior aspect of her left forearm that radiated into her left shoulder. Over the next several days, her symptoms worsened. She began having numbness in her left arm. She then went to the emergency room at [facility], had MRI of the brain and C-spine, which showed demyelinating abnormalities  INTERVAL HISTORY / CHIEF COMPLAINT:   Ran out of baclofen, Provigil and Gabapentin 6 months ago. Now have pain in right hip that radiates down to the right foot. It is constant.  Having numbness and tingling on the left side that comes and goes.  Complaining of depression but no suicidal ideation.  ??  Generalized not motor fatigue: Tried on 06/21/2012 Amantadine 100 mg b.i.d.  Was not effective and she was started on Provigil. Has noticed improvement with fatigue with Provigil.    Last Fall: 7/18 when getting out of the tub.  Her leg just gets weak.  Precautions: Falls  Red Flags:  changes in bowel/bladder function-constipation    Prior Functional Status:  6 months ago has very mile right leg pain.    Current Functional Status:Moderated to severe leg pain, falls, lleft side goes numb and she can not hold things.  Currently used Equipment: has a cane but does not use it.  Previous Treatment: no  Employment/Recreation: Working part time as a Surveyor, mining.  On disability    Social History: Lives with 66 yo son in [place] in a 2 story house with o STE but 12 steps to bedroom with rail..  Drives a school bus part time  Caregiver availability, capability, willingness HHA 5 days per week for 2 hours.  2 daughters can also help  Independent w/ ADLs?: Sometimes she needs help with bath    Patient???s communication preference: Verbal, Written and Visual    Barriers to Learning: pain     OBJECTIVE :      Pain  Current:  3/10  No pain at end of session     Therex (35 min):   -nustep w/ b/l UEs and LEs level 2; total time: 10 minutes; total time of interval training: 4 minutes (30 seconds easy, 30 seconds hard), SPM in 70s and 100s during high intensity to improve strengthening   -supine hip marching to improve core control 2x10 w/ cues to perform posterior pelvic tilt to improve core strength   -SLR 1x10 on LLE, 2x5 on RLE to improve LE strength and core strengthening to relieve back pain   -  supine trunk twists 1x5 each side to increase muscle extensibility in lower spine   -hamstring stretch w/ sheet 2x30 sec stretch   -calf stretch 2x30 sec   -child's pose stretch for lower back 2x30 sec hold  -child's post stretch while reaching to the right, then left 2x30 sec stretch to relieve back pain   -STM to paraspinals      Manual Therapy (8 min):    STM to lumbar paraspinals to relieve pain to lower back-TTP to right side>left side      Printed handout w/ instructions of exercises provided    HEP   -single knee to chest 3x30 sec stretch   -child's pose stretch forward, right and left 3x30 sec hold   -calf stretch 3x30 sec hold  -hamstring stretch 3x30 sec hold   -supine trunk twists 1x5 each direction   -supine hip marching 2x10 each   -SLR 2x10           Total Time: 43 min    Treatment Rendered:    Therex: 35 min   Manual Therapy: 8 min     I attest that I have reviewed the above information.  Signed: Barry Dienes, PT  12/07/2017 8:46 AM

## 2017-12-14 NOTE — Unmapped (Signed)
[facility]  [address] Hannah Barr [place], [place] [address]    [phone]    Hannah Barr cancelled for her  scheduled Physical Therapy follow-up session due to having car trouble.  Please contact me if you have any questions or concerns.     Thank you for this referral,     Signed: Barry Dienes, PT  12/14/2017 8:02 AM

## 2017-12-21 ENCOUNTER — Other Ambulatory Visit: Payer: Self-pay

## 2017-12-21 NOTE — Unmapped (Signed)
[facility]  [address] [place], [place] [address]    [phone]    Hannah Barr did not show for her  scheduled Physical Therapy follow-up session.  Please contact me if you have any questions or concerns.     Thank you for this referral,     Signed: Barry Dienes, PT  12/21/2017 11:04 AM

## 2017-12-26 NOTE — Unmapped (Signed)
[facility]  [address] [place], [place] [address]    [phone]    Hannah Barr did not show for her  scheduled Physical Therapy follow-up session.  Please contact me if you have any questions or concerns.     Thank you for this referral,     Signed: Barry Dienes, PT  12/26/2017 9:49 AM

## 2017-12-27 ENCOUNTER — Other Ambulatory Visit: Payer: Self-pay | Admitting: Family Medicine

## 2017-12-27 DIAGNOSIS — I10 Essential (primary) hypertension: Secondary | ICD-10-CM

## 2017-12-27 DIAGNOSIS — F331 Major depressive disorder, recurrent, moderate: Secondary | ICD-10-CM

## 2017-12-27 NOTE — Telephone Encounter (Signed)
Refill request for Hypertension medication:  Amlodipine 5 mg  Last office visit pertaining to hypertension: 12/06/2017  BP Readings from Last 3 Encounters:  12/06/17 124/86  10/26/17 138/86  10/18/17 134/88     Lab Results  Component Value Date   CREATININE 0.68 09/21/2017   BUN 8 09/21/2017   NA 140 09/21/2017   K 4.1 09/21/2017   CL 103 09/21/2017   CO2 31 09/21/2017   Follow-up on file. 01/10/2018

## 2017-12-28 NOTE — Unmapped (Signed)
[facility]  [address] [place] [place], [place] [address]    [phone]    Hannah Barr did not show for her  scheduled Physical Therapy follow-up session.  Please contact me if you have any questions or concerns.     Thank you for this referral,     Signed: Barry Dienes, PT  12/28/2017 12:15 PM

## 2017-12-29 ENCOUNTER — Other Ambulatory Visit: Payer: Self-pay | Admitting: Family Medicine

## 2017-12-29 ENCOUNTER — Ambulatory Visit
Admission: RE | Admit: 2017-12-29 | Discharge: 2017-12-29 | Disposition: A | Discharge status: Home / Self Care | Payer: Medicare HMO | Source: Ambulatory Visit | Attending: Family Medicine | Admitting: Family Medicine

## 2017-12-29 DIAGNOSIS — N6321 Unspecified lump in the left breast, upper outer quadrant: Secondary | ICD-10-CM | POA: Diagnosis not present

## 2017-12-29 DIAGNOSIS — N6323 Unspecified lump in the left breast, lower outer quadrant: Secondary | ICD-10-CM | POA: Diagnosis not present

## 2017-12-29 DIAGNOSIS — N63 Unspecified lump in unspecified breast: Secondary | ICD-10-CM

## 2017-12-29 DIAGNOSIS — Z1239 Encounter for other screening for malignant neoplasm of breast: Secondary | ICD-10-CM

## 2017-12-29 DIAGNOSIS — R928 Other abnormal and inconclusive findings on diagnostic imaging of breast: Secondary | ICD-10-CM

## 2017-12-29 DIAGNOSIS — R922 Inconclusive mammogram: Secondary | ICD-10-CM | POA: Diagnosis not present

## 2017-12-29 DIAGNOSIS — N6320 Unspecified lump in the left breast, unspecified quadrant: Secondary | ICD-10-CM

## 2017-12-29 NOTE — Unmapped (Signed)
[facility] Refill Coordination Note  Specialty Medication(s): COPAXONE      Hannah Barr, DOB: 02-08-1971  Phone: [phone] (home) , Alternate phone contact: N/A  Phone or address changes today?: No  All above HIPAA information was verified with patient.  Shipping Address: [address]  [place] [address]   Insurance changes? No    Completed refill call assessment today to schedule patient's medication shipment from the [facility] [phone]).      Confirmed the medication and dosage are correct and have not changed: Yes, regimen is correct and unchanged.    Confirmed patient started or stopped the following medications in the past month:  No, there are no changes reported at this time.    Are you tolerating your medication?:  Hannah Barr reports tolerating the medication.    ADHERENCE        Did you miss any doses in the past 4 weeks? No missed doses reported.    FINANCIAL/SHIPPING    Delivery Scheduled: Yes, Expected medication delivery date: 01/04/18     Hannah Barr did not have any additional questions at this time.    Delivery address validated in FSI scheduling system: Yes, address listed in FSI is correct.    We will follow up with patient monthly for standard refill processing and delivery.      Thank you,  Westley Gambles   [facility] Specialty Technician

## 2018-01-02 ENCOUNTER — Other Ambulatory Visit: Payer: Self-pay | Admitting: Family Medicine

## 2018-01-02 DIAGNOSIS — M5431 Sciatica, right side: Secondary | ICD-10-CM

## 2018-01-02 DIAGNOSIS — M25551 Pain in right hip: Secondary | ICD-10-CM

## 2018-01-02 MED FILL — COPAXONE/40MG/ML/SOSY: COPAXONE/40MG/ML/SOSY | 28 days supply | Qty: 12 | Fill #3

## 2018-01-02 NOTE — Telephone Encounter (Signed)
Meloxicam was not meant to be long-term medication - was Rx'd for right hip pain/sciatica in January.  She has appointment with Dr. Ancil Boozer 01/10/18, and needs to follow up with her to determine if additional refills are warranted.  If she needs a few to get her through to that appointment, let me know and I will provide. Thanks!

## 2018-01-06 ENCOUNTER — Ambulatory Visit
Admission: RE | Admit: 2018-01-06 | Discharge: 2018-01-06 | Disposition: A | Discharge status: Home / Self Care | Payer: Medicare HMO | Source: Ambulatory Visit | Attending: Family Medicine | Admitting: Family Medicine

## 2018-01-06 DIAGNOSIS — N6042 Mammary duct ectasia of left breast: Secondary | ICD-10-CM | POA: Diagnosis not present

## 2018-01-06 DIAGNOSIS — N6321 Unspecified lump in the left breast, upper outer quadrant: Secondary | ICD-10-CM | POA: Diagnosis not present

## 2018-01-06 DIAGNOSIS — R928 Other abnormal and inconclusive findings on diagnostic imaging of breast: Secondary | ICD-10-CM

## 2018-01-06 DIAGNOSIS — N6320 Unspecified lump in the left breast, unspecified quadrant: Secondary | ICD-10-CM

## 2018-01-06 DIAGNOSIS — N62 Hypertrophy of breast: Secondary | ICD-10-CM | POA: Insufficient documentation

## 2018-01-06 DIAGNOSIS — N6323 Unspecified lump in the left breast, lower outer quadrant: Secondary | ICD-10-CM | POA: Diagnosis not present

## 2018-01-06 HISTORY — PX: BREAST BIOPSY: SHX20

## 2018-01-09 LAB — SURGICAL PATHOLOGY

## 2018-01-10 ENCOUNTER — Encounter: Payer: Self-pay | Admitting: Family Medicine

## 2018-01-10 ENCOUNTER — Ambulatory Visit (INDEPENDENT_AMBULATORY_CARE_PROVIDER_SITE_OTHER): Payer: Medicare HMO | Admitting: Family Medicine

## 2018-01-10 VITALS — BP 134/86 | HR 96 | Temp 98.2°F | Resp 16 | Ht 62.0 in | Wt 168.0 lb

## 2018-01-10 DIAGNOSIS — G43009 Migraine without aura, not intractable, without status migrainosus: Secondary | ICD-10-CM | POA: Diagnosis not present

## 2018-01-10 DIAGNOSIS — E8881 Metabolic syndrome: Secondary | ICD-10-CM | POA: Diagnosis not present

## 2018-01-10 DIAGNOSIS — R928 Other abnormal and inconclusive findings on diagnostic imaging of breast: Secondary | ICD-10-CM

## 2018-01-10 DIAGNOSIS — E669 Obesity, unspecified: Secondary | ICD-10-CM | POA: Diagnosis not present

## 2018-01-10 DIAGNOSIS — I10 Essential (primary) hypertension: Secondary | ICD-10-CM | POA: Diagnosis not present

## 2018-01-10 DIAGNOSIS — G35 Multiple sclerosis: Secondary | ICD-10-CM

## 2018-01-10 MED ORDER — LIRAGLUTIDE 18 MG/3ML ~~LOC~~ SOPN: 0.6000 mg | PEN_INJECTOR | Freq: Every day | SUBCUTANEOUS | 2 refills | Status: DC

## 2018-01-10 MED ORDER — HYDROCHLOROTHIAZIDE 12.5 MG PO TABS: 12.5000 mg | ORAL_TABLET | Freq: Every day | ORAL | 2 refills | Status: DC

## 2018-01-10 MED ORDER — SUMATRIPTAN SUCCINATE 100 MG PO TABS: 100.0000 mg | ORAL_TABLET | ORAL | 1 refills | Status: DC | PRN

## 2018-01-10 MED ORDER — ONDANSETRON HCL 4 MG PO TABS: 4.0000 mg | ORAL_TABLET | Freq: Three times a day (TID) | ORAL | 0 refills | Status: DC | PRN

## 2018-01-10 MED ORDER — AMLODIPINE BESYLATE 2.5 MG PO TABS: 2.5000 mg | ORAL_TABLET | Freq: Every day | ORAL | 2 refills | Status: DC

## 2018-01-10 MED ORDER — INSULIN PEN NEEDLE 32G X 6 MM MISC: 1.0000 | Freq: Every day | 1 refills | Status: DC

## 2018-01-10 NOTE — Progress Notes (Signed)
Name: Tammie Lopez   MRN: [id number]    DOB: 1971/06/14   Date:01/10/2018       Progress Note  Subjective  Chief Complaint  Chief Complaint  Patient presents with  . Follow-up    Increased Amlodipine to 5 mg-seems like the 5 mg drops BP too fast versus 2.5 mg.  . Hypertension    Patient states she has been dizzy and only half takes medication.  . Obesity    Saxenda was not covered and would like to discuss other medications    HPI  HTN:she has not been compliant with medication, states hard to cut Norvasc 5 mg, so when she takes a full pill it makes her feel sluggish, she denies side effects of HcTZ but skips medication also. BP today is 134/86 and states also about the same at home, but spikes when stressed. We will go down on dose of Norvasc and HCTZ and monitor. No chest pain or palpitation  MS: she is stable, seeing psychiatrist, still on Copaxone, provigil and nortriptyline. Also on Gabapentin, no side effects of medication. She feels tired . Working part time as a Recruitment consultant. She has paresthesias - currently on left side of trunk, she has weakness on right leg.   Insulin resistance: she was on Trulicity however not losing weight, she tried metformin but did not help either, she is now obese, we will try Victoza , she denies family history of thyroid cancer or personal history of pancreatitis  Migraine headaches: usually before her cycles and also one more time per month, takes imitrex prn. Migraine is described as throbbing, behind her right eye. She has associated nausea, no vomtiing, also has photophobia and phonophobia.   Patient Active Problem List   Diagnosis Date Noted  . Obesity (BMI 30-39.9) 12/06/2017  . Insulin resistance 09/27/2017  . Essential hypertension 09/21/2017  . Right sided sciatica 09/21/2017  . Major depression, recurrent ([place]) 05/24/2017  . Migraine without aura, not intractable 12/09/2016  . Obese 12/09/2016  . Nickel allergy 12/09/2016  . GERD  without esophagitis 12/09/2016  . Leukopenia 12/09/2016  . Dyslipidemia 12/09/2016  . Diverticulosis 12/09/2016  . 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 Major depression in remission ([place]) 12/09/2016  . Allergic rhinitis, seasonal 12/09/2016  . Chronic constipation 12/09/2016  . Vitamin D deficiency disease 08/06/2013  . MS (multiple sclerosis) ([place]) 04/17/2013  . History of hypertension 06/21/2012    Past Surgical History:  Procedure Laterality Date  . BREAST BIOPSY Left 01/06/2018   pending path  . BREAST BIOPSY Left 01/06/2018   pending path  . BUNIONECTOMY    . CESAREAN SECTION N/A 2006  . GANGLION CYST EXCISION Bilateral    Hands    Family History  Problem Relation Age of Onset  . Diabetes Mother   . Hypertension Mother   . Cancer Father 75       Mouth  . COPD Father   . Hypertension Brother   . Diabetes Brother     Social History   Socioeconomic History  . Marital status: Single    Spouse name: Not on file  . Number of children: Not on file  . Years of education: Not on file  . Highest education level: Not on file  Social Needs  . Financial resource strain: Not on file  . Food insecurity - worry: Not on file  . Food insecurity - inability: Not on file  . Transportation needs - medical: Not on file  . Transportation needs - non-medical:  Not on file  Occupational History  . Not on file  Tobacco Use  . Smoking status: Never Smoker  . Smokeless tobacco: Never Used  Substance and Sexual Activity  . Alcohol use: No  . Drug use: No  . Sexual activity: Not Currently    Partners: Male  Other Topics Concern  . 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  amitriptyline (ELAVIL) 25 MG tablet, Take 1 tablet by mouth as needed for sleep., Disp: , Rfl: 2 .  amLODipine (NORVASC) 5 MG tablet, Take 0.5-1 tablets (2.5-5 mg total) by mouth  daily., Disp: 60 tablet, Rfl: 0 .  gabapentin (NEURONTIN) 300 MG capsule, Take 600 mg by mouth 3 (three) times daily., Disp: , Rfl:  .  Glatiramer Acetate (COPAXONE) 40 MG/ML SOSY, Inject 40 mg into the skin 3 (three) times a week., Disp: , Rfl:  .  hydrochlorothiazide (HYDRODIURIL) 25 MG tablet, Take 1 tablet (25 mg total) by mouth daily., Disp: 30 tablet, Rfl:  .  linaclotide (LINZESS) 145 MCG CAPS capsule, Take 1 capsule (145 mcg total) by mouth daily., Disp: 30 capsule, Rfl: 5 .  meloxicam (MOBIC) 15 MG tablet, Take 0.5-1 tablets (7.5-15 mg total) by mouth daily., Disp: 30 tablet, Rfl: 0 .  modafinil (PROVIGIL) 100 MG tablet, Take 1 tablet by mouth daily., Disp: , Rfl:  .  ondansetron (ZOFRAN) 4 MG tablet, Take 1 tablet (4 mg total) by mouth every 8 (eight) hours as needed for nausea or vomiting., Disp: 20 tablet, Rfl: 0 .  SUMAtriptan (IMITREX) 100 MG tablet, Take 1 tablet (100 mg total) by mouth every 2 (two) hours as needed for migraine. May repeat in 2 hours if headache persists or recurs., Disp: 9 tablet, Rfl: 1 .  traZODone (DESYREL) 50 MG tablet, Take 0.5-1 tablets (25-50 mg total) by mouth at bedtime as needed for sleep., Disp: 30 tablet, Rfl: 3 .  cyclobenzaprine (FLEXERIL) 10 MG tablet, Take 1 tablet (10 mg total) by mouth 2 (two) times daily as needed for muscle spasms. Do not drive while taking as can cause drowsiness (Patient not taking: Reported on 01/10/2018), Disp: 15 tablet, Rfl: 0 .  dicyclomine (BENTYL) 10 MG capsule, Take 1 capsule (10 mg total) by mouth 3 (three) times daily as needed for spasms. (Patient not taking: Reported on 01/10/2018), Disp: 20 capsule, Rfl: 0 .  escitalopram (LEXAPRO) 10 MG tablet, Take 1 tablet (10 mg total) by mouth daily. (Patient not taking: Reported on 01/10/2018), Disp: 30 tablet, Rfl: 0 .  Liraglutide -Weight Management (SAXENDA) 18 MG/3ML SOPN, Inject 0.6 mg into the skin daily. (Patient not taking: Reported on 01/10/2018), Disp: 2 pen, Rfl:  0  Allergies  Allergen Reactions  . Penicillins Hives     ROS  Constitutional: Negative for fever or weight change.  Respiratory: Negative for cough and shortness of breath.   Cardiovascular: Negative for chest pain or palpitations.  Gastrointestinal: Negative for abdominal pain, no bowel changes.  Musculoskeletal: Negative for gait problem or joint swelling.  Skin: Negative for rash.  Neurological: Negative for dizziness , positive for intermittent  headache.  No other specific complaints in a complete review of systems (except as listed in HPI above).  Objective  Vitals:   01/10/18 1037  BP: 134/86  Pulse: 96  Resp: 16  Temp: 98.2 F (  36.8 C)  TempSrc: Oral  SpO2: 98%  Weight: 168 lb (76.2 kg)  Height: 5\' 2"  (1.575 m)    Body mass index is 30.73 kg/m.  Physical Exam  Constitutional: Patient appears well-developed and well-nourished. Obese  No distress.  HEENT: head atraumatic, normocephalic, pupils equal and reactive to light,  neck supple, throat within normal limits Cardiovascular: Normal rate, regular rhythm and normal heart sounds.  No murmur heard. No BLE edema. Pulmonary/Chest: Effort normal and breath sounds normal. No respiratory distress. Abdominal: Soft.  There is no tenderness. Psychiatric: Patient has a normal mood and affect. behavior is normal. Judgment and thought content normal.  Recent Results (from the past 2160 hour(s))  Surgical pathology     Status: None   Collection Time: 01/06/18  8:47 AM  Result Value Ref Range   SURGICAL PATHOLOGY      Surgical Pathology CASE: [id number] PATIENT: [facility] Surgical Pathology Report     SPECIMEN SUBMITTED: A. Breast, left, 3:00 4 CMFN B. Breast, left, 3:00, 1 CMFN  CLINICAL HISTORY: 1.  Left breast 3:00 4 CMFN mass - rule out cancer.  2.  Left breast 3:00 1 CMFN intraductal material vs mass - rule out cancer  PRE-OPERATIVE DIAGNOSIS: None provided  POST-OPERATIVE DIAGNOSIS: None  provided.     DIAGNOSIS: A. BREAST, LEFT 3:00, 4 CM FN; ULTRASOUND GUIDED BIOPSY: - FIBROEPITHELIAL LESION WITH HYALINIZED STROMA. - DUCT ECTASIA. - NEGATIVE FOR ATYPIA AND MALIGNANCY.  B. BREAST, LEFT 3:00, 1 CM FN; ULTRASOUND GUIDED BIOPSY: - BENIGN BREAST TISSUE WITH DUCT ECTASIA AND FOCAL PSEUDO-ANGIOMATOUS STROMAL HYPERPLASIA. - PROTEINACEOUS DEBRIS. - NEGATIVE FOR ATYPIA AND MALIGNANCY.  Comment: The findings of a fibroepithelial lesion with hyalinized stroma present in specimen A may represent a partially sampled fibroadenoma. Correlation with clinical impre ssion and radiographic findings is required.   GROSS DESCRIPTION:  A. The specimen is received in a formalin-filled container labeled with the patient's name and left breast 3:00, 4 cm from nipple.  Core pieces: multiple, aggregate 1.0 x 0.9 x 0.1 cm Comments: yellow to red lobulated fibrofatty fragment, marked blue  Entirely submitted in cassette(s): 1  Time/Date in fixative: collected and placed in formalin a 827 AM on 01/06/2018 Total fixation time: 12 hours  B. The specimen is received in a formalin-filled container labeled with the patient's name and left breast 3:00, 1 cm from nipple.  Core pieces: multiple, aggregate 1.5 x 1.0 x 0.1 cm Comments: yellow to red focally chalky yellow fragment of tissue, marked green  Entirely submitted in cassette(s): 1  Time/Date in fixative: collected and placed in formalin at 8:34 AM on 01/06/2018 Total fixation time: 12 hours   Final Diagnosis performed by Quay Burow, MD.  Electronically signed 01/09/2018 9:18: 01AM    The electronic signature indicates that the named Attending Pathologist has evaluated the specimen  Technical component performed at [facility], [address], [place], [place] [address] Lab: [phone] Dir: Rush Farmer, MD, MMM  Professional component performed at [facility], [facility], [place],  [place], [place] [address] Lab: [phone] Dir: Dellia Nims. Rubinas, MD      PHQ2/9: Depression screen [facility] 2/9 01/10/2018 09/21/2017 05/24/2017  Decreased Interest 0 3 1  Down, Depressed, Hopeless 0 1 1  PHQ - 2 Score 0 4 2  Altered sleeping - 3 3  Tired, decreased energy - 1 3  Change in appetite - 3 3  Feeling bad or failure about yourself  - 0 1  Trouble concentrating - 1  0  Moving slowly or fidgety/restless - 1 1  Suicidal thoughts - 0 0  PHQ-9 Score - 13 13  Difficult doing work/chores - Very difficult Not difficult at all    Fall Risk: Fall Risk  01/10/2018 10/18/2017 05/24/2017  Falls in the past year? Yes No No  Number falls in past yr: 1 - -  Injury with Fall? No - -     Functional Status Survey: Is the patient deaf or have difficulty hearing?: No Does the patient have difficulty seeing, even when wearing glasses/contacts?: No Does the patient have difficulty concentrating, remembering, or making decisions?: No Does the patient have difficulty walking or climbing stairs?: No Does the patient have difficulty dressing or bathing?: No Does the patient have difficulty doing errands alone such as visiting a doctor's office or shopping?: No    Assessment & Plan  1. Insulin resistance  - liraglutide (VICTOZA) 18 MG/3ML SOPN; Inject 0.1-0.3 mLs (0.6-1.8 mg total) into the skin daily.  Dispense: 9 mL; Refill: 2 - Insulin Pen Needle (NOVOFINE) 32G X 6 MM MISC; 1 each by Does not apply route daily.  Dispense: 100 each; Refill: 1  2. Essential hypertension  We will decrease dose of Norvasc and HCTZ , not very compliant and bp is better today, she asked to go back to work full time, but that is when her bp spiked, and explained she does not need to be getting up at 5 am.  - amLODipine (NORVASC) 2.5 MG tablet; Take 1 tablet (2.5 mg total) by mouth daily.  Dispense: 30 tablet; Refill: 2 - hydrochlorothiazide (HYDRODIURIL) 12.5 MG tablet; Take 1 tablet (12.5 mg total) by mouth daily.   Dispense: 30 tablet; Refill: 2  3. MS (multiple sclerosis) (HCC)  Stable, goes to [facility]  4. Obesity (BMI 30-39.9)  Discussed with the patient the risk posed by an increased BMI. Discussed importance of portion control, calorie counting and at least 150 minutes of physical activity weekly. Avoid sweet beverages and drink more water. Eat at least 6 servings of fruit and vegetables daily   5. Migraine without aura and without status migrainosus, not intractable  - SUMAtriptan (IMITREX) 100 MG tablet; Take 1 tablet (100 mg total) by mouth every 2 (two) hours as needed for migraine. May repeat in 2 hours if headache persists or recurs.  Dispense: 9 tablet; Refill: 1 - ondansetron (ZOFRAN) 4 MG tablet; Take 1 tablet (4 mg total) by mouth every 8 (eight) hours as needed for nausea or vomiting.  Dispense: 20 tablet; Refill: 0  6. Abnormal mammogram of left breast  Negative pathology, she will go back for repeat mammogram in 6 months.

## 2018-02-01 MED FILL — COPAXONE/40MG/ML/SOSY: COPAXONE/40MG/ML/SOSY | 28 days supply | Qty: 12 | Fill #4

## 2018-02-01 NOTE — Unmapped (Signed)
[facility] Refill Coordination Note  Specialty Medication(s): COPAXONE 40MG /ML      Hannah Barr, DOB: Oct 29, 1971  Phone: [phone] (home) , Alternate phone contact: N/A  Phone or address changes today?: No  All above HIPAA information was verified with patient.  Shipping Address: [address]  [place] [address]   Insurance changes? No    Completed refill call assessment today to schedule patient's medication shipment from the [facility] [phone]).      Confirmed the medication and dosage are correct and have not changed: Yes, regimen is correct and unchanged.    Confirmed patient started or stopped the following medications in the past month:  No, there are no changes reported at this time.    Are you tolerating your medication?:  Hannah Barr reports tolerating the medication.    ADHERENCE    (Below is required for Medicare Part B or Transplant patients only - per drug):   How many tablets were dispensed last month: 1 BOX=12  Patient currently has 2 DOSES remaining.    Did you miss any doses in the past 4 weeks? No missed doses reported.    FINANCIAL/SHIPPING    Delivery Scheduled: Yes, Expected medication delivery date: 030819     The patient will receive an FSI print out for each medication shipped and additional FDA Medication Guides as required.  Patient education from [place] or Robet Leu may also be included in the shipment    Hannah Barr did not have any additional questions at this time.    Delivery address validated in FSI scheduling system: Yes, address listed in FSI is correct.    We will follow up with patient monthly for standard refill processing and delivery.      Thank you,  Westley Gambles   [facility] Specialty Technician

## 2018-02-07 ENCOUNTER — Ambulatory Visit
Admission: EM | Admit: 2018-02-07 | Discharge: 2018-02-07 | Disposition: A | Discharge status: Home / Self Care | Payer: Medicare HMO | Attending: Family Medicine | Admitting: Family Medicine

## 2018-02-07 ENCOUNTER — Other Ambulatory Visit: Payer: Self-pay

## 2018-02-07 DIAGNOSIS — Z79899 Other long term (current) drug therapy: Secondary | ICD-10-CM | POA: Insufficient documentation

## 2018-02-07 DIAGNOSIS — J029 Acute pharyngitis, unspecified: Secondary | ICD-10-CM | POA: Diagnosis present

## 2018-02-07 DIAGNOSIS — J069 Acute upper respiratory infection, unspecified: Secondary | ICD-10-CM | POA: Insufficient documentation

## 2018-02-07 DIAGNOSIS — Z794 Long term (current) use of insulin: Secondary | ICD-10-CM | POA: Diagnosis not present

## 2018-02-07 LAB — RAPID STREP SCREEN (MED CTR MEBANE ONLY): Streptococcus, Group A Screen (Direct): NEGATIVE

## 2018-02-07 MED ORDER — HYDROCOD POLST-CPM POLST ER 10-8 MG/5ML PO SUER: 5.0000 mL | Freq: Two times a day (BID) | ORAL | 0 refills | Status: DC | PRN

## 2018-02-07 NOTE — ED Triage Notes (Signed)
Patient complains of sore throat, body aches, ear pain. Patient states that symptoms started on Saturday.

## 2018-02-07 NOTE — ED Provider Notes (Signed)
[facility]    CSN: [id number] Arrival date & time: 02/07/18  1801  History   Chief Complaint Chief Complaint  Patient presents with  . Sore Throat   HPI  47 year old female with a complex medical history who presents with upper respiratory symptoms.  Patient states she has been sick since Saturday.  She has had sore throat, body aches, ear pain, body aches, cough, hoarseness.  No fever.  No known exacerbating relieving factors.  No reported sick contacts.  No medications or interventions tried.  She states that her pain is currently 8/10 in severity.  No other associated symptoms.  No other complaints.  Past Medical History:  Diagnosis Date  . Allergy   . Diverticula of colon   . MS (multiple sclerosis) [facility])    Patient Active Problem List   Diagnosis Date Noted  . Obesity (BMI 30-39.9) 12/06/2017  . Insulin resistance 09/27/2017  . Essential hypertension 09/21/2017  . Right sided sciatica 09/21/2017  . Major depression, recurrent ([place]) 05/24/2017  . Migraine without aura, not intractable 12/09/2016  . Nickel allergy 12/09/2016  . GERD without esophagitis 12/09/2016  . Leukopenia 12/09/2016  . Dyslipidemia 12/09/2016  . Diverticulosis 12/09/2016  . 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 Allergic rhinitis, seasonal 12/09/2016  . Chronic constipation 12/09/2016  . Vitamin D deficiency disease 08/06/2013  . MS (multiple sclerosis) ([place]) 04/17/2013   Past Surgical History:  Procedure Laterality Date  . BREAST BIOPSY Left 01/06/2018   pending path  . BREAST BIOPSY Left 01/06/2018   pending path  . BUNIONECTOMY    . CESAREAN SECTION N/A 2006  . GANGLION CYST EXCISION Bilateral    Hands   OB History    No data available     Home Medications    Prior to Admission medications   Medication Sig Start Date End Date Taking? Authorizing Provider  amitriptyline (ELAVIL) 25 MG tablet Take 1 tablet by mouth as needed for sleep. 09/21/17  Yes [provider]    amLODipine (NORVASC) 2.5 MG tablet Take 1 tablet (2.5 mg total) by mouth daily. 01/10/18  Yes Sowles, Drue Stager, MD  gabapentin (NEURONTIN) 300 MG capsule Take 600 mg by mouth 3 (three) times daily. 08/27/16  Yes Zelasky, Clara J, PA-C  Glatiramer Acetate (COPAXONE) 40 MG/ML SOSY Inject 40 mg into the skin 3 (three) times a week.   Yes Zelasky, Clara J, PA-C  hydrochlorothiazide (HYDRODIURIL) 12.5 MG tablet Take 1 tablet (12.5 mg total) by mouth daily. 01/10/18  Yes Sowles, Drue Stager, MD  Insulin Pen Needle (NOVOFINE) 32G X 6 MM MISC 1 each by Does not apply route daily. 01/10/18  Yes Sowles, Drue Stager, MD  linaclotide Rolan Lipa) 145 MCG CAPS capsule Take 1 capsule (145 mcg total) by mouth daily. 12/09/16  Yes Sowles, Drue Stager, MD  liraglutide (VICTOZA) 18 MG/3ML SOPN Inject 0.1-0.3 mLs (0.6-1.8 mg total) into the skin daily. 01/10/18  Yes Sowles, Drue Stager, MD  meloxicam (MOBIC) 15 MG tablet Take 0.5-1 tablets (7.5-15 mg total) by mouth daily. 12/06/17  Yes Hubbard Hartshorn, FNP  modafinil (PROVIGIL) 100 MG tablet Take 1 tablet by mouth daily. 08/27/16  Yes Zelasky, Clara J, PA-C  SUMAtriptan (IMITREX) 100 MG tablet Take 1 tablet (100 mg total) by mouth every 2 (two) hours as needed for migraine. May repeat in 2 hours if headache persists or recurs. 01/10/18  Yes Steele Sizer, MD  traZODone (DESYREL) 50 MG tablet Take 0.5-1 tablets (25-50 mg total) by mouth at bedtime as needed for sleep. 10/19/17  Yes Steele Sizer, MD  chlorpheniramine-HYDROcodone [facility] PENNKINETIC ER) 10-8 MG/5ML SUER Take 5 mLs by mouth every 12 (twelve) hours as needed. 02/07/18   Coral Spikes, DO    Family History Family History  Problem Relation Age of Onset  . Diabetes Mother   . Hypertension Mother   . Cancer Father 75       Mouth  . COPD Father   . Hypertension Brother   . Diabetes Brother     Social History Social History   Tobacco Use  . Smoking status: Never Smoker  . Smokeless tobacco: Never Used  Substance Use  Topics  . Alcohol use: No  . Drug use: No     Allergies   Penicillins   Review of Systems Review of Systems  Constitutional: Negative for fever.  HENT: Positive for ear pain, sore throat and voice change.   Respiratory: Positive for cough.   Musculoskeletal:       Body aches.   Physical Exam Triage Vital Signs ED Triage Vitals [02/07/18 1829]  Enc Vitals Group     BP (!) 175/99     Pulse Rate 98     Resp 18     Temp 98.5 F (36.9 C)     Temp Source Oral     SpO2 98 %     Weight 160 lb (72.6 kg)     Height 5\' 2"  (1.575 m)     Head Circumference      Peak Flow      Pain Score 8     Pain Loc      Pain Edu?      Excl. in [place]?    Updated Vital Signs BP (!) 175/99 (BP Location: Left Arm)   Pulse 98   Temp 98.5 F (36.9 C) (Oral)   Resp 18   Ht 5\' 2"  (1.575 m)   Wt 160 lb (72.6 kg)   LMP 01/29/2018   SpO2 98%   BMI 29.26 kg/m   Physical Exam  Constitutional: She is oriented to person, place, and time. She appears well-developed. No distress.  HENT:  Head: Normocephalic and atraumatic.  Nose: Nose normal.  Mouth/Throat: Oropharynx is clear and moist.  Eyes: Conjunctivae are normal. Right eye exhibits no discharge. Left eye exhibits no discharge.  Cardiovascular: Normal rate and regular rhythm.  Pulmonary/Chest: Effort normal and breath sounds normal. She has no wheezes. She has no rales.  Neurological: She is alert and oriented to person, place, and time.  Psychiatric: She has a normal mood and affect. Her behavior is normal.  Nursing note and vitals reviewed.  UC Treatments / Results  Labs (all labs ordered are listed, but only abnormal results are displayed) Labs Reviewed  RAPID STREP SCREEN (NOT AT [facility])  CULTURE, GROUP A STREP [facility])    EKG  EKG Interpretation None       Radiology No results found.  Procedures Procedures (including critical care time)  Medications Ordered in UC Medications - No data to display   Initial Impression /  Assessment and Plan / UC Course  I have reviewed the triage vital signs and the nursing notes.  Pertinent labs & imaging results that were available during my care of the patient were reviewed by me and considered in my medical decision making (see chart for details).     47 year old female presents with viral upper respiratory infection.  Rapid strep negative.  Supportive care.  Tussionex given for cough.  Final Clinical Impressions(s) /  UC Diagnoses   Final diagnoses:  Viral upper respiratory tract infection    ED Discharge Orders        Ordered    chlorpheniramine-HYDROcodone (TUSSIONEX PENNKINETIC ER) 10-8 MG/5ML SUER  Every 12 hours PRN     02/07/18 1844     Controlled Substance Prescriptions [place] Controlled Substance Registry consulted? Not Applicable   Coral Spikes, DO 02/07/18 1904

## 2018-02-07 NOTE — Discharge Instructions (Signed)
Rest, fluids.  Cough medication as needed.  Take care  Dr. Lacinda Axon

## 2018-02-10 LAB — CULTURE, GROUP A STREP (THRC)

## 2018-02-16 ENCOUNTER — Encounter: Payer: Self-pay | Admitting: Family Medicine

## 2018-02-16 ENCOUNTER — Ambulatory Visit (INDEPENDENT_AMBULATORY_CARE_PROVIDER_SITE_OTHER): Payer: Medicare HMO | Admitting: Family Medicine

## 2018-02-16 VITALS — BP 144/98 | HR 93 | Temp 98.3°F | Resp 16 | Ht 62.0 in | Wt 166.4 lb

## 2018-02-16 DIAGNOSIS — J0100 Acute maxillary sinusitis, unspecified: Secondary | ICD-10-CM

## 2018-02-16 MED ORDER — FLUTICASONE PROPIONATE 50 MCG/ACT NA SUSP: 2.0000 | Freq: Every day | NASAL | 6 refills | Status: DC

## 2018-02-16 MED ORDER — AZITHROMYCIN 500 MG PO TABS: 500.0000 mg | ORAL_TABLET | Freq: Every day | ORAL | 0 refills | Status: DC

## 2018-02-16 NOTE — Progress Notes (Signed)
Name: Tammie Lopez   MRN: [id number]    DOB: 1971-10-07   Date:02/16/2018       Progress Note  Subjective  Chief Complaint  Chief Complaint  Patient presents with  . URI    Onset-2 weeks, headaches, sinus drainage, nagging cough and feels bad.  Went to Urgent Care and was given cough medication but did not help also tried Sudafed    HPI  Sinusitis: she went to Urgent care 11 days ago and diagnosed with URI, however still has a lot of post-nasal drainage, nasal congestion, having facial pressure, cough from the drainage, and some nausea, no fever, chills have resolved. She is taking otc medication without much help. She denies itching. She states nasal drainage can be clear or green.   Patient Active Problem List   Diagnosis Date Noted  . Obesity (BMI 30-39.9) 12/06/2017  . Insulin resistance 09/27/2017  . Essential hypertension 09/21/2017  . Right sided sciatica 09/21/2017  . Major depression, recurrent ([place]) 05/24/2017  . Migraine without aura, not intractable 12/09/2016  . Nickel allergy 12/09/2016  . GERD without esophagitis 12/09/2016  . Leukopenia 12/09/2016  . Dyslipidemia 12/09/2016  . Diverticulosis 12/09/2016  . 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 Allergic rhinitis, seasonal 12/09/2016  . Chronic constipation 12/09/2016  . Vitamin D deficiency disease 08/06/2013  . MS (multiple sclerosis) ([place]) 04/17/2013    Social History   Tobacco Use  . Smoking status: Never Smoker  . Smokeless tobacco: Never Used  Substance Use Topics  . Alcohol use: No     Current Outpatient Medications:  .  amitriptyline (ELAVIL) 25 MG tablet, Take 1 tablet by mouth as needed for sleep., Disp: , Rfl: 2 .  amLODipine (NORVASC) 2.5 MG tablet, Take 1 tablet (2.5 mg total) by mouth daily., Disp: 30 tablet, Rfl: 2 .  chlorpheniramine-HYDROcodone (TUSSIONEX PENNKINETIC ER) 10-8 MG/5ML SUER, Take 5 mLs by mouth every 12 (twelve) hours as needed., Disp: 60 mL, Rfl: 0 .  gabapentin (NEURONTIN) 300 MG  capsule, Take 600 mg by mouth 3 (three) times daily., Disp: , Rfl:  .  Glatiramer Acetate (COPAXONE) 40 MG/ML SOSY, Inject 40 mg into the skin 3 (three) times a week., Disp: , Rfl:  .  hydrochlorothiazide (HYDRODIURIL) 12.5 MG tablet, Take 1 tablet (12.5 mg total) by mouth daily., Disp: 30 tablet, Rfl: 2 .  Insulin Pen Needle (NOVOFINE) 32G X 6 MM MISC, 1 each by Does not apply route daily., Disp: 100 each, Rfl: 1 .  linaclotide (LINZESS) 145 MCG CAPS capsule, Take 1 capsule (145 mcg total) by mouth daily., Disp: 30 capsule, Rfl: 5 .  liraglutide (VICTOZA) 18 MG/3ML SOPN, Inject 0.1-0.3 mLs (0.6-1.8 mg total) into the skin daily., Disp: 9 mL, Rfl: 2 .  meloxicam (MOBIC) 15 MG tablet, Take 0.5-1 tablets (7.5-15 mg total) by mouth daily., Disp: 30 tablet, Rfl: 0 .  modafinil (PROVIGIL) 100 MG tablet, Take 1 tablet by mouth daily., Disp: , Rfl:  .  SUMAtriptan (IMITREX) 100 MG tablet, Take 1 tablet (100 mg total) by mouth every 2 (two) hours as needed for migraine. May repeat in 2 hours if headache persists or recurs., Disp: 9 tablet, Rfl: 1 .  traZODone (DESYREL) 50 MG tablet, Take 0.5-1 tablets (25-50 mg total) by mouth at bedtime as needed for sleep., Disp: 30 tablet, Rfl: 3 .  azithromycin (ZITHROMAX) 500 MG tablet, Take 1 tablet (500 mg total) by mouth daily., Disp: 3 tablet, Rfl: 0 .  fluticasone (FLONASE) 50 MCG/ACT nasal spray,  Place 2 sprays into both nostrils daily., Disp: 16 g, Rfl: 6  Allergies  Allergen Reactions  . Penicillins Hives    ROS  Ten systems reviewed and is negative except as mentioned in HPI   Objective  Vitals:   02/16/18 1151  BP: (!) 144/98  Pulse: 93  Resp: 16  Temp: 98.3 F (36.8 C)  TempSrc: Oral  SpO2: 97%  Weight: 166 lb 6.4 oz (75.5 kg)  Height: 5\' 2"  (1.575 m)    Body mass index is 30.43 kg/m.    Physical Exam  Constitutional: Patient appears well-developed and well-nourished. Obese  No distress.  HEENT: head atraumatic, normocephalic,  pupils equal and reactive to light, ears normal TM bilaterally, boggy turbinates, tender to palpation over maxillary sinuses , neck supple, throat within normal limits Cardiovascular: Normal rate, regular rhythm and normal heart sounds.  No murmur heard. No BLE edema. Pulmonary/Chest: Effort normal and breath sounds normal. No respiratory distress. Abdominal: Soft.  There is no tenderness. Psychiatric: Patient has a normal mood and affect. behavior is normal. Judgment and thought content normal.  Recent Results (from the past 2160 hour(s))  Surgical pathology     Status: None   Collection Time: 01/06/18  8:47 AM  Result Value Ref Range   SURGICAL PATHOLOGY      Surgical Pathology CASE: [id number] PATIENT: [facility] Surgical Pathology Report     SPECIMEN SUBMITTED: A. Breast, left, 3:00 4 CMFN B. Breast, left, 3:00, 1 CMFN  CLINICAL HISTORY: 1.  Left breast 3:00 4 CMFN mass - rule out cancer.  2.  Left breast 3:00 1 CMFN intraductal material vs mass - rule out cancer  PRE-OPERATIVE DIAGNOSIS: None provided  POST-OPERATIVE DIAGNOSIS: None provided.     DIAGNOSIS: A. BREAST, LEFT 3:00, 4 CM FN; ULTRASOUND GUIDED BIOPSY: - FIBROEPITHELIAL LESION WITH HYALINIZED STROMA. - DUCT ECTASIA. - NEGATIVE FOR ATYPIA AND MALIGNANCY.  B. BREAST, LEFT 3:00, 1 CM FN; ULTRASOUND GUIDED BIOPSY: - BENIGN BREAST TISSUE WITH DUCT ECTASIA AND FOCAL PSEUDO-ANGIOMATOUS STROMAL HYPERPLASIA. - PROTEINACEOUS DEBRIS. - NEGATIVE FOR ATYPIA AND MALIGNANCY.  Comment: The findings of a fibroepithelial lesion with hyalinized stroma present in specimen A may represent a partially sampled fibroadenoma. Correlation with clinical impre ssion and radiographic findings is required.   GROSS DESCRIPTION:  A. The specimen is received in a formalin-filled container labeled with the patient's name and left breast 3:00, 4 cm from nipple.  Core pieces: multiple, aggregate 1.0 x 0.9 x 0.1  cm Comments: yellow to red lobulated fibrofatty fragment, marked blue  Entirely submitted in cassette(s): 1  Time/Date in fixative: collected and placed in formalin a 827 AM on 01/06/2018 Total fixation time: 12 hours  B. The specimen is received in a formalin-filled container labeled with the patient's name and left breast 3:00, 1 cm from nipple.  Core pieces: multiple, aggregate 1.5 x 1.0 x 0.1 cm Comments: yellow to red focally chalky yellow fragment of tissue, marked green  Entirely submitted in cassette(s): 1  Time/Date in fixative: collected and placed in formalin at 8:34 AM on 01/06/2018 Total fixation time: 12 hours   Final Diagnosis performed by Quay Burow, MD.  Electronically signed 01/09/2018 9:18: 01AM    The electronic signature indicates that the named Attending Pathologist has evaluated the specimen  Technical component performed at [facility], [address], [place], [place] [address] Lab: [id number] Dir: Rush Farmer, MD, MMM  Professional component performed at [facility], [place], [place], [place] [address] Lab:  [phone] Dir: Dellia Nims. Rubinas, MD    Rapid strep screen     Status: None   Collection Time: 02/07/18  6:32 PM  Result Value Ref Range   Streptococcus, Group A Screen (Direct) NEGATIVE NEGATIVE    Comment: (NOTE) A Rapid Antigen test may result negative if the antigen level in the sample is below the detection level of this test. The FDA has not cleared this test as a stand-alone test therefore the rapid antigen negative result has reflexed to a Group A Strep culture. Performed at [facility], [address], [place], [place] [address]   Culture, group A strep     Status: None   Collection Time: 02/07/18  6:32 PM  Result Value Ref Range   Specimen Description      THROAT Performed at [facility], [address] [place], [place] [address]    Special Requests       NONE Reflexed from [id number] Performed at [facility], [address] [place], [place] [address]    Culture      NO GROUP A STREP (S.PYOGENES) ISOLATED Performed at [facility], [place] [address] [place], [place] [address]    Report Status 02/10/2018 FINAL      Assessment & Plan  1. Acute maxillary sinusitis, recurrence not specified  - fluticasone (FLONASE) 50 MCG/ACT nasal spray; Place 2 sprays into both nostrils daily.  Dispense: 16 g; Refill: 6 - azithromycin (ZITHROMAX) 500 MG tablet; Take 1 tablet (500 mg total) by mouth daily.  Dispense: 3 tablet; Refill: 0

## 2018-03-06 MED FILL — COPAXONE/40MG/ML/SOSY: COPAXONE/40MG/ML/SOSY | 28 days supply | Qty: 12 | Fill #5

## 2018-03-06 NOTE — Unmapped (Signed)
[facility] Refill Coordination Note  Specialty Medication(s): COPAXONE      Hannah Barr, DOB: 25-May-1971  Phone: [phone] (home) , Alternate phone contact: N/A  Phone or address changes today?: No  All above HIPAA information was verified with patient.  Shipping Address: [address]  [place] [address]   Insurance changes? No    Completed refill call assessment today to schedule patient's medication shipment from the [facility] [phone]).      Confirmed the medication and dosage are correct and have not changed: Yes, regimen is correct and unchanged.    Confirmed patient started or stopped the following medications in the past month:  No, there are no changes reported at this time.    Are you tolerating your medication?:  Hannah Barr reports tolerating the medication.    ADHERENCE    (Below is required for Medicare Part B or Transplant patients only - per drug):   How many tablets were dispensed last month: 12  Patient currently has 2 remaining.    Did you miss any doses in the past 4 weeks? No missed doses reported.    FINANCIAL/SHIPPING    Delivery Scheduled: Yes, Expected medication delivery date: 4/10     The patient will receive an FSI print out for each medication shipped and additional FDA Medication Guides as required.  Patient education from [place] or Robet Leu may also be included in the shipment    Hannah Barr did not have any additional questions at this time.    Delivery address validated in FSI scheduling system: Yes, address listed in FSI is correct.    We will follow up with patient monthly for standard refill processing and delivery.      Thank you,  Westley Gambles   [facility] Specialty Technician

## 2018-03-20 NOTE — Unmapped (Signed)
Specialty Pharmacy - Neurology Medication Clinical Assessment      Hannah Barr is a 47 y.o. female contacted today regarding  her specialty medication(s) glatiramer acetate (COPAXONE)    Verified patient's date of birth / HIPAA.    Medications reviewed and verified with patient: Allergies - Medications -        Specialty medication(s) and dose(s) confirmed: yes  Changes to medications: no  Changes to insurance: no    Is therapy still appropriate given the disease, patient response, and medical condition? yes  Is therapy still effective? yes    Medication Adherence    Patient reported X missed doses in the last month:  0  Specialty Medication:  Copaxone  Patient is on additional specialty medications:  No  Patient is on more than two specialty medications:  No  Any gaps in refill history greater than 2 weeks in the last 3 months:  no  Demonstrates understanding of importance of adherence:  yes  Informant:  patient  Reliability of informant:  reliable  Provider-estimated medication adherence level:  good  Patient is at risk for Non-Adherence:  No  Reasons for non-adherence:  no problems identified           Adverse Effects    *All other systems reviewed and are negative       Drug Interactions    Drug interactions evaluated:  yes  Clinically relevant drug interactions identified:  no  Provided the patient with educational material regarding drug interactions:  not applicable       Patient Counseling    Counseled the patient on the following:  doses and administration discussed, pharmacy contact information discussed         The patient will receive an FSI print out for each medication shipped and additional FDA Medication Guides as required. Patient education from [place] or Robet Leu may also be included in the shipment.    Entered by Oliva Bustard, PharmD Candidate, acting as scribe for Worthy Flank, PharmD, CPP.     Signature: Worthy Flank, PharmD, CPP  Clinical Pharmacist, [facility]  Phone: [phone]    March 20, 2018 3:22 PM

## 2018-04-10 NOTE — Unmapped (Signed)
[facility] Refill Coordination Note  Specialty Medication(s): COPAXONE      Hannah Barr, DOB: Dec 22, 1970  Phone: [phone] (home) , Alternate phone contact: N/A  Phone or address changes today?: No  All above HIPAA information was verified with patient.  Shipping Address: [address]  [place] [address]   Insurance changes? No    Completed refill call assessment today to schedule patient's medication shipment from the [facility] [phone]).      Confirmed the medication and dosage are correct and have not changed: Yes, regimen is correct and unchanged.    Confirmed patient started or stopped the following medications in the past month:  No, there are no changes reported at this time.    Are you tolerating your medication?:  Hannah Barr reports tolerating the medication.    ADHERENCE    (Below is required for Medicare Part B or Transplant patients only - per drug):   How many tablets were dispensed last month: 12  Patient currently has 2 (FOR 5/13 AND 5/15) remaining.    Did you miss any doses in the past 4 weeks? No missed doses reported.    FINANCIAL/SHIPPING    Delivery Scheduled: Yes, Expected medication delivery date: 5/16, SAME DAY VIA [facility]     The patient will receive an FSI print out for each medication shipped and additional FDA Medication Guides as required.  Patient education from [place] or Hannah Barr may also be included in the shipment    Hannah Barr did not have any additional questions at this time.    Delivery address validated in FSI scheduling system: Yes, address listed in FSI is correct.    We will follow up with patient monthly for standard refill processing and delivery.      Thank you,  Hannah Barr   [facility] Specialty Technician

## 2018-04-11 ENCOUNTER — Ambulatory Visit
Admit: 2018-04-11 | Discharge: 2018-04-12 | Payer: MEDICARE | Attending: Physician Assistant | Primary: Physician Assistant

## 2018-04-11 DIAGNOSIS — G35 Multiple sclerosis: Secondary | ICD-10-CM | POA: Diagnosis not present

## 2018-04-11 DIAGNOSIS — R202 Paresthesia of skin: Secondary | ICD-10-CM | POA: Diagnosis not present

## 2018-04-11 DIAGNOSIS — R200 Anesthesia of skin: Secondary | ICD-10-CM | POA: Diagnosis not present

## 2018-04-11 DIAGNOSIS — R635 Abnormal weight gain: Secondary | ICD-10-CM | POA: Diagnosis not present

## 2018-04-11 DIAGNOSIS — M5431 Sciatica, right side: Secondary | ICD-10-CM | POA: Diagnosis not present

## 2018-04-11 DIAGNOSIS — I10 Essential (primary) hypertension: Secondary | ICD-10-CM | POA: Diagnosis not present

## 2018-04-11 DIAGNOSIS — R5383 Other fatigue: Secondary | ICD-10-CM | POA: Diagnosis not present

## 2018-04-11 DIAGNOSIS — E559 Vitamin D deficiency, unspecified: Secondary | ICD-10-CM | POA: Diagnosis not present

## 2018-04-11 DIAGNOSIS — M25551 Pain in right hip: Secondary | ICD-10-CM | POA: Diagnosis not present

## 2018-04-11 DIAGNOSIS — R69 Illness, unspecified: Secondary | ICD-10-CM | POA: Diagnosis not present

## 2018-04-11 LAB — CREATININE: EGFR MDRD AF AMER: >=60 mL/min/{1.73_m2} (ref >=60)

## 2018-04-11 LAB — VITAMIN D, TOTAL (25OH): Lab: 16.3 — ABNORMAL LOW

## 2018-04-11 LAB — EGFR MDRD AF AMER: Glomerular filtration rate/1.73 sq M.predicted.black:ArVRat:Pt:Ser/Plas/Bld:Qn:Creatinine-based formula (MDRD): >=60

## 2018-04-11 MED ORDER — COPAXONE 40 MG/ML SUBCUTANEOUS SYRINGE
2 refills | 0 days
Start: 2018-04-11 — End: 2018-04-11

## 2018-04-11 MED ORDER — GLATIRAMER 40 MG/ML SUBCUTANEOUS SYRINGE
INJECTION | 2 refills | 0 days | Status: CP
Start: 2018-04-11 — End: 2018-06-15

## 2018-04-11 MED ORDER — MODAFINIL 100 MG TABLET
ORAL_TABLET | 2 refills | 0 days | Status: CP
Start: 2018-04-11 — End: 2018-06-15

## 2018-04-11 MED ORDER — CHOLECALCIFEROL (VITAMIN D3) 1,250 MCG (50,000 UNIT) CAPSULE
ORAL_CAPSULE | ORAL | 0 refills | 0 days | Status: CP
Start: 2018-04-11 — End: 2018-06-15

## 2018-04-11 MED ORDER — GABAPENTIN 300 MG CAPSULE
ORAL_CAPSULE | Freq: Three times a day (TID) | ORAL | 2 refills | 0 days | Status: CP
Start: 2018-04-11 — End: 2018-06-15

## 2018-04-11 NOTE — Unmapped (Signed)
[facility] [employer] [facility]  Multiple Sclerosis / Neuroimmunology Division  Bryona Foxworthy [name] [facility]  Physician Assistant    Phone: [phone]  Fax: [phone]  ????  Patient Name: Hannah Barr   Date of Birth: [birth year]  Medical Record Number: [id number]  [address]  [place] [address]  ??  Direct entry by:  [name], PA-C.  Supervising Physician: Dr. [name].    DATE OF VISIT: Apr 10, 2018    REASON FOR VISIT: Followup in the Neuroimmunology Clinic for evaluation of relapsing remitting multiple sclerosis. Last seen 09/20/2017.    ASSESSMENT AND PLAN:   **  Relapsing Remitting Multiple Sclerosis:  -Refill  Copaxone, restarted 03/03/2015. Requesting to change due to needle fatigue.    -Check creatinine before MRI's and Vit D 25-OH.  -Check RPR. Not checked previously.  -Check NMO APQ4 IgG and MOG IgG1 (spinal cord lesions with minimal brain lesions).  -Re-order  MRI of the brain, cervical and thoracic spine w /wo contrast with adult sedation. 03/29/2015 MRI of the thoracic spine showed a new enhancing lesion.    -Performed: T25FW, MMSE and PHQ9.  -Form filled out for MS Society to see if they can help with transportation for MRI's.    **  Vitamin D deficiency: Vitamin D 25-OH = 16.3. Start Vitamin D3 50,000 units weekly for 8 weeks and than 4,000 units daily.My chart message sent and Rx sent to pharmacy.       ** Right sciatica: Refill Gabapentin 300mg  TID   ** Depression: Recommend  local mental health. Denies suicidal ideation.  PHQ9 = 12. Does not want to take medication.  ** Fatigue:  Refill Provigil 100 mg  daily.     - Return to see [name] after MRI's  -Total visit time =    43  Minutes. 0942/1025.  Greater than 50% of the face to face time was spent in consultation and treatment planning on the  disease process, medication, dosing and side effects. MRI's reviewed personally by myself and with patient. INTERVAL HISTORY / CHIEF COMPLAINT:   Patient feels like she is down because she has gained weight. Does not want to take mediation for depression.   Ran out of baclofen, Provigil and Gabapentin 6 months ago. Now have pain in right hip that radiates down to the right foot. It is constant.  Having numbness and tingling on the left side that comes and goes.  Complaining of depression but no suicidal ideation.    Generalized not motor fatigue: Tried on 06/21/2012 Amantadine 100 mg b.i.d.  Was not effective and she was started on Provigil. Has noticed improvement with fatigue with Provigil.    Patient reports that they are taking DMT appropriately and is not experiencing any side effects.  Since the last visit patient states that they have not had any clinical flare-ups nor IV steroids.  Denies double vision, pain with eye movement or color desaturation.  Denies urinary leakage, urgency or inability to empty bladder.  Denies fatigue, pain , numbness or tingling.  Denies muscle spasms.  Patient confirms that they are taking Vit D3 supplement.  Patient reports mother in hospital.    PRIOR HISTORY: A 12 y.o.  African-American female who was diagnosed with relapsing remitting multiple sclerosis 02/11/2011.   She was seen as a new patient on 02/10/2011 by Dr. Denice Bors.   Initial symptoms began in 12/2010. This started abruptly with occipital headaches with  occasional tension pain in her neck. Two days later, she started having discrimination to light touch along the posterior aspect of her left forearm that radiated into her left shoulder. Over the next several days, her symptoms worsened. She began having numbness in her left arm. She then went to the emergency room at [facility], had MRI of the brain and C-spine, which showed demyelinating abnormalities.     SCREENING LABS:   02/10/2011 : ACE 21, ANA positive, 1:80, RF <6.3, Lyme Serology neg, B12 =  339.    MRI REVIEW:  08/14/2016  MRI of the brain compared to 03/29/2015: Interval stability of the multiple T2/FLAIR hyperintense lesions are present within the periventricular and deep white matter, many of which are oriented perpendicular to the lateral ventricles, compatible with patient history of multiple sclerosis. Many of the periventricular lesions demonstrate T1 signal below that of normal appearing gray matter consistent with chronic plaque. There is callososeptal involvement and moderate volume loss of the posterior body and splenium of the corpus callosum. Mild prominence of the sulci is noted and greater than expected for patient age. 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 The optic nerves are symmetric without evidence of enlargement or T2/FLAIR signal abnormality.    08/14/2016 MRI of the cervical spine compared to 03/29/2015: There is redemonstration of abnormal T2 hyperintensity within the left posterior lateral medulla and cervical cord at the C2 level in the right anterior and lateral cord, in the left lateral cord at the C3 level, unchanged. No new lesion or abnormal enhancement is present.    08/14/2016 MRI of the thoracic spine compared to 03/29/2015: Redemonstration of the nonenhancing T2 hyperintense lesion at the T6 level, unchanged. The previously described T2 hyperintense lesion at T1 and T8-T9 at not again visualized.     03/29/2015 MRI of the thoracic spine showed a new enhancing lesion.    MS FLARE-UPS: Never received IVMP.    MS MEDICATION HISTORY:   She was started on Copaxone approximately 02/11/2011 .     GYN HISTORY:  LNM 07/2016.    REVIEW OF SYSTEMS:   10 systems reviewed and otherwise negative except as noted in HPI.    Office Visit on 04/11/2018   Component Date Value Ref Range Status   ??? Vitamin D Total (25OH) 04/11/2018 16.3* 20.0 - 80.0 ng/mL Final   ??? Creatinine 04/11/2018 0.74  0.60 - 1.00 mg/dL Final   ??? EGFR MDRD Af Amer 04/11/2018 >=60  >=60 mL/min/1.87m2 Final   ??? EGFR MDRD Non Af Amer 04/11/2018 >=60  >=60 mL/min/1.5m2 Final Patient Active Problem List   Diagnosis   ??? Hypertension   ??? Multiple sclerosis (CMS-HCC)   ??? MS (multiple sclerosis) (CMS-HCC)   ??? Vitamin D deficiency disease         Allergies   Allergen Reactions   ??? Penicillins Hives     Social History     Socioeconomic History   ??? Marital status: Single     Spouse name: Not on file   ??? Number of children: Not on file   ??? Years of education: Not on file   ??? Highest education level: Not on file   Occupational History   ??? Not on file   Social Needs   ??? Financial resource strain: Not on file   ??? Food insecurity:     Worry: Not on file     Inability: Not on file   ??? Transportation needs:     Medical: Not  on file     Non-medical: Not on file   Tobacco Use   ??? Smoking status: Never Smoker   ??? Smokeless tobacco: Never Used   Substance and Sexual Activity   ??? Alcohol use: No   ??? Drug use: No   ??? Sexual activity: Not on file   Lifestyle   ??? Physical activity:     Days per week: Not on file     Minutes per session: Not on file   ??? Stress: Not on file   Relationships   ??? Social connections:     Talks on phone: Not on file     Gets together: Not on file     Attends religious service: Not on file     Active member of club or organization: Not on file     Attends meetings of clubs or organizations: Not on file     Relationship status: Not on file   Other Topics Concern   ??? Not on file   Social History Narrative    She lives by herself and 2 children, ages 20     and 46. She has not worked since 01/2011. She used to work as a     Interior and spatial designer. Substance abuse, denies tobacco, alcohol or illicit     drug use.                  VITAL SIGNS:  BP 137/79 (BP Site: R Arm, BP Position: Sitting, BP Cuff Size: Medium)  - Pulse 66  - Ht 157.5 cm (5' 2)  - Wt 76.8 kg (169 lb 4.8 oz)  - BMI 30.97 kg/m??     Neurological Examination:   Alert, oriented to person, place and time. Speech with no dysarthria or aphasia.     Cranial Nerves:   II, III- Pupils are equal 2 mm and reactive to light b/l.  III, IV, VI- extra ocular movements are intact, No ptosis, no nystagmus.  V- sensation of the face decreased on the left in all 3 branches.   VII- face symmetrical, no facial droop, normal facial movements with smile/grimace  VIII- Hearing grossly intact.  IX and X- symmetric palate contraction, normal gag bilaterally  XI- Full shoulder shrug bilaterally  XII- Tongue protrudes midline, full range of movements of the tongue    Motor Exam:   ??  Muscles UEs ?? LEs   ?? R L ?? R L   Deltoids 5/5 5/5 Hip flexors  5/5 5/5   Biceps 5/5 5/5 Hip extensors 5/5 5/5   Triceps 5/5 5/5 Knee flexors 5/5 5/5   Hand grip 5/5 5/5 Knee extensors 5/5 5/5   Wrist flexors 5/5 5/5 Foot dorsal flexors 5/5 5/5   Wrist extensors 5/5 5/5 Foot plantar flexors 5/5 5/5   Finger flexors 5/5 5/5      Finger extensors 5/5 5/5         Normal bulk and tone.  No clonus.    Reflexes R L   Brachioradialis +2 +2   Biceps +2 +2   Triceps +2 +2   Patella +2 +2   Achilles +2 +2     Negative babinski.    Sensory UEs LEs    R L R L          Pin prick WNL Decreased but can tell sharp from dull. WNL WNL   Vibration WNL WNL WNL WNL   Proprioception WNL WNL WNL WNL     Cerebellar/Coordination:  Rapid  alternating movements, finger-to-nose and heel-to-shin  bilaterally demonstrates no abnormalities.    Gait: Normal stride, base and arm swing. Able to tandem, heel, and toe gait without difficulty.     Baseline:  T25FW= 5.19 seconds.  MMSE = 30.  PHQ9 = 12.  09/20/2017 PHQ9 = 7.    Patient Health Questionnaire, PHQ9.  In the last two weeks how often have you been bothered by the following problems:    1. Little interest or pleasure in doing things......2.  2. Feeling  down, depressed or hopeless...Marland Kitchen1.  3. Trouble falling asleep or stay asleep or sleeping too much...Marland Kitchen3.  4. Feeling tired or having little energy...Marland Kitchen2.  5. Poor appetite or overeating...Marland Kitchen3.  6. Feeling bad about yourself or that you are a failure or have let yourself or family down...Marland Kitchen1.  7. Trouble concentrating on things such as reading the newspaper or watching TV...1.  8. Moving or speaking so slowly that other people could have noticed? Or the opposite being so fidgety or restless that you have been moving around a lot more than usual....0.  9. Thoughts that you would be better off dead or of hurting yourself in some way....0.    How difficult have these problems made it for you to do you right work, take care of things at home or get along with other people....not difficult.  Key  0 = not at all.  1 = several days.  2 = More than half of the days.  3 = Nearly every day.

## 2018-04-11 NOTE — Unmapped (Signed)
Retrun to see [name] after MRI's.  ??  ??   In case of:  ?? a suspected relapse (new symptoms or worsening existing symptoms, lasting for >24h)  OR  ?? a need for an additional appointment for other reasons     Please contact:    [facility] Neurology [facility] Desk  Phone: [phone]      OR     Ms. Webb Laws  Administrative [facility], Department of Neurology  [id number]     [place], [place] [address]     Phone: [phone], Fax: [phone]           Yolande Jolly [facility]    [facility] Neurology /   Multiple Sclerosis Division    [address]    [place], [place] [address]

## 2018-04-12 ENCOUNTER — Other Ambulatory Visit: Payer: Self-pay | Admitting: Family Medicine

## 2018-04-12 ENCOUNTER — Ambulatory Visit: Payer: Medicare HMO | Admitting: Family Medicine

## 2018-04-12 DIAGNOSIS — I10 Essential (primary) hypertension: Secondary | ICD-10-CM

## 2018-04-12 LAB — SYPHILIS RPR SCREEN: Reagin Ab:PrThr:Pt:Ser:Ord:RPR: NONREACTIVE

## 2018-04-12 MED FILL — COPAXONE/40MG/ML/SOSY: COPAXONE/40MG/ML/SOSY | 28 days supply | Qty: 12 | Fill #0

## 2018-04-19 LAB — MOG FACS: Myelin oligodendrocyte glycoprotein Ab.IgG1:PrThr:Pt:Ser/Plas:Ord:Flow cytometry: NEGATIVE

## 2018-04-20 ENCOUNTER — Other Ambulatory Visit: Payer: Self-pay

## 2018-04-20 DIAGNOSIS — I10 Essential (primary) hypertension: Secondary | ICD-10-CM

## 2018-04-20 NOTE — Telephone Encounter (Signed)
Hypertension medication request: Amlodipine to [facility]  Last office visit pertaining to hypertension:  02/16/2018   BP Readings from Last 3 Encounters:  02/16/18 (!) 144/98  02/07/18 (!) 175/99  01/10/18 134/86    Lab Results  Component Value Date   CREATININE 0.68 09/21/2017   BUN 8 09/21/2017   NA 140 09/21/2017   K 4.1 09/21/2017   CL 103 09/21/2017   CO2 31 09/21/2017     No follow-ups on file.

## 2018-04-21 MED ORDER — AMLODIPINE BESYLATE 2.5 MG PO TABS: 2.5000 mg | ORAL_TABLET | Freq: Every day | ORAL | 0 refills | Status: DC

## 2018-04-21 NOTE — Telephone Encounter (Signed)
Sending 30 days, but needs follow up

## 2018-04-21 NOTE — Telephone Encounter (Signed)
Pt has appt with Benjamine Mola on 04-25-18

## 2018-04-25 ENCOUNTER — Ambulatory Visit: Payer: Medicare HMO | Admitting: Nurse Practitioner

## 2018-04-26 LAB — NMO AQP4 IGG, SERUM: Aquaporin 4 receptor Ab.IgG:PrThr:Pt:Ser/Plas:Ord:: NEGATIVE

## 2018-05-01 NOTE — Unmapped (Signed)
[facility] Refill Coordination Note  Specialty Medication(s): Copaxone  Additional Medications shipped: none    Hannah Barr, DOB: [birth year]  Phone: [phone] (home) , Alternate phone contact: N/A  Phone or address changes today?: No  All above HIPAA information was verified with patient.  Shipping Address: [address]  [place] [address]   Insurance changes? No    Completed refill call assessment today to schedule patient's medication shipment from the [facility] [phone]).      Confirmed the medication and dosage are correct and have not changed: Yes, regimen is correct and unchanged.    Confirmed patient started or stopped the following medications in the past month:  No, there are no changes reported at this time.    Are you tolerating your medication?:  Hannah Barr reports tolerating the medication.    ADHERENCE    (Below is required for Medicare Part B or Transplant patients only - per drug):   How many tablets were dispensed last month: 12   Patient currently has 6 remaining.    Did you miss any doses in the past 4 weeks? No missed doses reported.    FINANCIAL/SHIPPING    Delivery Scheduled: Yes, Expected medication delivery date: 05/10/2018     The patient will receive an [employer]SI print out for each medication shipped and additional FDA Medication Guides as required.  Patient education from [place] or Robet Leu may also be included in the shipment    Hannah Barr did not have any additional questions at this time.    Delivery address validated in FSI scheduling system: Yes, address listed in FSI is correct.    We will follow up with patient monthly for standard refill processing and delivery.      Thank you,  Breck Coons [facility] Specialty Pharmacist

## 2018-05-09 MED FILL — COPAXONE/40MG/ML/SOSY: COPAXONE/40MG/ML/SOSY | 28 days supply | Qty: 12 | Fill #1

## 2018-05-10 ENCOUNTER — Ambulatory Visit: Admit: 2018-05-10 | Discharge: 2018-05-10 | Payer: MEDICARE

## 2018-05-10 DIAGNOSIS — G939 Disorder of brain, unspecified: Secondary | ICD-10-CM | POA: Diagnosis not present

## 2018-05-10 DIAGNOSIS — R9082 White matter disease, unspecified: Secondary | ICD-10-CM | POA: Diagnosis not present

## 2018-05-10 DIAGNOSIS — G959 Disease of spinal cord, unspecified: Secondary | ICD-10-CM | POA: Diagnosis not present

## 2018-05-10 DIAGNOSIS — G35 Multiple sclerosis: Secondary | ICD-10-CM | POA: Diagnosis not present

## 2018-05-10 NOTE — Unmapped (Signed)
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  Patient is currently on Copaxone and wanting to switch to oral medication due to needle fatigue.  Her next appointment is with [name] 07/11/2018.  Recommend to change this appointment to sooner.  Message sent to staff to call and do this.

## 2018-05-17 NOTE — Unmapped (Signed)
-----   Message from Petra Kuba, CMA sent at 05/17/2018  1:08 PM EDT -----  I left a message for this patient. Just waiting on her to call back. We ill call again a few more times.     Thanks, Lillia Abed  ----- Message -----  From: [name], PA  Sent: 05/10/2018   4:24 PM  To: , #    Please move appointment up from 07/11/2018 to sooner to see [name]     Thank you, Arseniy Toomey

## 2018-05-31 NOTE — Unmapped (Signed)
Patient has appt 7/11 and wants refill call after that in case they change her medication.

## 2018-06-06 ENCOUNTER — Encounter: Payer: Self-pay | Admitting: Family Medicine

## 2018-06-06 ENCOUNTER — Ambulatory Visit (INDEPENDENT_AMBULATORY_CARE_PROVIDER_SITE_OTHER): Payer: Medicare HMO | Admitting: Family Medicine

## 2018-06-06 VITALS — BP 112/74 | HR 76 | Temp 98.6°F | Resp 16 | Ht 62.0 in | Wt 168.8 lb

## 2018-06-06 DIAGNOSIS — E559 Vitamin D deficiency, unspecified: Secondary | ICD-10-CM | POA: Diagnosis not present

## 2018-06-06 DIAGNOSIS — G4709 Other insomnia: Secondary | ICD-10-CM

## 2018-06-06 DIAGNOSIS — I10 Essential (primary) hypertension: Secondary | ICD-10-CM | POA: Diagnosis not present

## 2018-06-06 DIAGNOSIS — E669 Obesity, unspecified: Secondary | ICD-10-CM

## 2018-06-06 DIAGNOSIS — E785 Hyperlipidemia, unspecified: Secondary | ICD-10-CM

## 2018-06-06 DIAGNOSIS — E88819 Insulin resistance, unspecified: Secondary | ICD-10-CM

## 2018-06-06 DIAGNOSIS — E8881 Metabolic syndrome: Secondary | ICD-10-CM

## 2018-06-06 DIAGNOSIS — G43009 Migraine without aura, not intractable, without status migrainosus: Secondary | ICD-10-CM

## 2018-06-06 DIAGNOSIS — R1013 Epigastric pain: Secondary | ICD-10-CM | POA: Diagnosis not present

## 2018-06-06 DIAGNOSIS — D708 Other neutropenia: Secondary | ICD-10-CM

## 2018-06-06 DIAGNOSIS — G35 Multiple sclerosis: Secondary | ICD-10-CM

## 2018-06-06 DIAGNOSIS — G35D Multiple sclerosis, unspecified: Secondary | ICD-10-CM

## 2018-06-06 DIAGNOSIS — R69 Illness, unspecified: Secondary | ICD-10-CM | POA: Diagnosis not present

## 2018-06-06 DIAGNOSIS — K5909 Other constipation: Secondary | ICD-10-CM

## 2018-06-06 DIAGNOSIS — F325 Major depressive disorder, single episode, in full remission: Secondary | ICD-10-CM

## 2018-06-06 MED ORDER — TRAZODONE HCL 50 MG PO TABS: 25.0000 mg | ORAL_TABLET | Freq: Every evening | ORAL | 3 refills | Status: DC | PRN

## 2018-06-06 MED ORDER — LINACLOTIDE 290 MCG PO CAPS: 290.0000 ug | ORAL_CAPSULE | Freq: Every day | ORAL | 2 refills | Status: DC

## 2018-06-06 MED ORDER — SUMATRIPTAN SUCCINATE 100 MG PO TABS: 100.0000 mg | ORAL_TABLET | ORAL | 1 refills | Status: DC | PRN

## 2018-06-06 NOTE — Progress Notes (Signed)
Name: Tammie Lopez   MRN: [id number]    DOB: 02/03/1971   Date:06/06/2018       Progress Note  Subjective  Chief Complaint  Chief Complaint  Patient presents with  . Medication Refill    Linzess, Imitrex, Mpobic, Trazodone  . Abdominal Pain    patient is questioning her gall bladder. she stated that she cannot move her bowels even with Linzess  . RTW    patient wants to be totally released to go back to work    HPI  HTN:she stopped taking medications, bp has been dropping. 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 BP is at goal, we will continue to monitor. No chest pain or palpitation   MS: she is stable, seeing neurologist , still on Copaxone, provigil, gabapentin and nortriptyline, she is tired of giving herself daily injections and has discussed a change in therapy with her neurologist. She also has a new lesion without any symptoms.. She feels tired at times.   Insulin resistance: she was on Trulicity however not losing weight, she tried metformin but did not help either, she is now obese, we tried Victoza but she stopped it because it did not work. Morbid obesity; explained importance of life style changes  Migraine headaches: usually before her cycles and also one more time per month, takes imitrex prn. Migraine is described as throbbing, behind her right eye. She has associated nausea, no vomtiing, also has photophobia and phonophobia. She needs refill of imitrex. Unchanged   Insomnia: still has difficulty staying asleep, and sometimes falling asleep, not taking trazodone every night. When she takes it she sleeps well. 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   Dyspepsia and constipation: dyspepsia may be secondary to constipation that is not controlled even with Linzess, seen by GI in the past, we will increase dose of medication, but also needs to increase fiber in her diet.    Patient Active Problem List   Diagnosis Date Noted  . Obesity (BMI 30-39.9) 12/06/2017  . Insulin resistance 09/27/2017  . Essential hypertension 09/21/2017  . Right sided sciatica 09/21/2017  . Major depression, recurrent ([place]) 05/24/2017  . Migraine without aura, not intractable 12/09/2016  . Nickel allergy 12/09/2016  . GERD without esophagitis 12/09/2016  . Other neutropenia ([place]) 12/09/2016  . Dyslipidemia 12/09/2016  . Diverticulosis 12/09/2016  . 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 Allergic rhinitis, seasonal 12/09/2016  . Chronic constipation 12/09/2016  . Vitamin D deficiency disease 08/06/2013  . MS (multiple sclerosis) ([place]) 04/17/2013    Past Surgical History:  Procedure Laterality Date  . BREAST BIOPSY Left 01/06/2018   pending path  . BREAST BIOPSY Left 01/06/2018   pending path  . BUNIONECTOMY    . CESAREAN SECTION N/A 2006  . GANGLION CYST EXCISION Bilateral    Hands    Family History  Problem Relation Age of Onset  . Diabetes Mother   . Hypertension Mother   . Cancer Father 75       Mouth  . COPD Father   .  Hypertension Brother   . Diabetes Brother     Social History   Socioeconomic History  . Marital status: Single    Spouse name: Not on file  . Number of children: 3  . Years of education: Not on file  . Highest education level: High school graduate  Occupational History  . Not on file  Social Needs  . Financial resource strain: Somewhat hard  . Food insecurity:    Worry: Never true    Inability: Never true  . Transportation needs:    Medical: No    Non-medical: No  Tobacco Use  . Smoking status: Never Smoker  . Smokeless tobacco: Never Used   Substance and Sexual Activity  . Alcohol use: No  . Drug use: No  . Sexual activity: Not Currently    Partners: Male  Lifestyle  . Physical activity:    Days per week: 3 days    Minutes per session: Not on file  . Stress: Not at all  Relationships  . Social connections:    Talks on phone: More than three times a week    Gets together: More than three times a week    Attends religious service: More than 4 times per year    Active member of club or organization: No    Attends meetings of clubs or organizations: Never    Relationship status: Never married  . Intimate partner violence:    Fear of current or ex partner: No    Emotionally abused: No    Physically abused: No    Forced sexual activity: No  Other Topics Concern  . 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  linaclotide (LINZESS) 145 MCG CAPS capsule, Take 1 capsule (145 mcg total) by mouth daily., Disp: 30 capsule, Rfl: 5 .  modafinil (PROVIGIL) 100 MG tablet, Take 1 tablet by mouth daily., Disp: , Rfl:  .  SUMAtriptan (IMITREX) 100 MG tablet, Take 1 tablet (100 mg total) by mouth every 2 (two) hours as needed for migraine. May repeat in 2 hours if headache persists or recurs., Disp: 9 tablet, Rfl: 1 .  amitriptyline (ELAVIL) 25 MG tablet, Take 1 tablet by mouth as needed for sleep., Disp: , Rfl: 2 .  gabapentin (NEURONTIN) 300 MG capsule, Take 600 mg by mouth 3 (three) times daily., Disp: , Rfl:  .  traZODone (DESYREL) 50 MG tablet, Take 0.5-1 tablets (25-50 mg total) by mouth at bedtime as needed for sleep., Disp: 30 tablet, Rfl: 3  Allergies  Allergen Reactions  . Penicillins Hives     ROS  Constitutional: Negative for fever , positive for mild weight change.   Respiratory: Negative for cough and shortness of breath.   Cardiovascular: Negative for chest pain or palpitations.  Gastrointestinal: positive for intermittent abdominal pain, no bowel changes, always constipated and has dyspepsia at times.  Musculoskeletal: Negative for gait problem or joint swelling.  Skin: Negative for rash.  Neurological: Negative for dizziness or headache.  No other specific complaints in a complete review of systems (except as listed in HPI above).  Objective  Vitals:   06/06/18 0901  BP: 112/74  Pulse: 76  Resp: 16  Temp:  98.6 F (37 C)  TempSrc: Oral  SpO2: 97%  Weight: 168 lb 12.8 oz (76.6 kg)  Height: 5\' 2"  (1.575 m)    Body mass index is 30.87 kg/m.  Physical Exam  Constitutional: Patient appears well-developed and well-nourished. Obese  No distress.  HEENT: head atraumatic, normocephalic, pupils equal and reactive to light,  neck supple, throat within normal limits Cardiovascular: Normal rate, regular rhythm and normal heart sounds.  No murmur heard. No BLE edema. Pulmonary/Chest: Effort normal and breath sounds normal. No respiratory distress. Abdominal: Soft.  There is no tenderness. Psychiatric: Patient has a normal mood and affect. behavior is normal. Judgment and thought content normal.  PHQ2/9: Depression screen [facility] 2/9 06/06/2018 06/06/2018 01/10/2018 09/21/2017 05/24/2017  Decreased Interest 0 0 0 3 1  Down, Depressed, Hopeless 0 0 0 1 1  PHQ - 2 Score 0 0 0 4 2  Altered sleeping 3 - - 3 3  Tired, decreased energy 0 - - 1 3  Change in appetite 1 - - 3 3  Feeling bad or failure about yourself  0 - - 0 1  Trouble concentrating 0 - - 1 0  Moving slowly or fidgety/restless 0 - - 1 1  Suicidal thoughts 0 - - 0 0  PHQ-9 Score 4 - - 13 13  Difficult doing work/chores Not difficult at all - - Very difficult Not difficult at all     Fall Risk: Fall Risk  06/06/2018 01/10/2018 10/18/2017 05/24/2017  Falls in the past year? No Yes No No   Number falls in past yr: - 1 - -  Injury with Fall? - No - -     Functional Status Survey: Is the patient deaf or have difficulty hearing?: No Does the patient have difficulty seeing, even when wearing glasses/contacts?: No Does the patient have difficulty concentrating, remembering, or making decisions?: No Does the patient have difficulty walking or climbing stairs?: No Does the patient have difficulty dressing or bathing?: No Does the patient have difficulty doing errands alone such as visiting a doctor's office or shopping?: No   Assessment & Plan  1. Chronic constipation  Still has constipation even with linzess 145 mg daily, discussed high fiber diet - linaclotide (LINZESS) 290 MCG CAPS capsule; Take 1 capsule (290 mcg total) by mouth daily.  Dispense: 30 capsule; Refill: 2  2. Other insomnia  Takes it prn  - traZODone (DESYREL) 50 MG tablet; Take 0.5-1 tablets (25-50 mg total) by mouth at bedtime as needed for sleep.  Dispense: 30 tablet; Refill: 3  3. MS (multiple sclerosis) ([place])  Seeing neurologist and tired of given herself shots, they are considering change in therapy   4. Migraine without aura and without status migrainosus, not intractable  - SUMAtriptan (IMITREX) 100 MG tablet; Take 1 tablet (100 mg total) by mouth every 2 (two) hours as needed for migraine. May repeat in 2 hours if headache persists or recurs.  Dispense: 9 tablet; Refill: 1  5. Other neutropenia ([place])  Recheck labs  - CBC with Differential/Platelet  6. Moderate episode of recurrent major depressive in remission  ([place])  Stable at this time   7. Essential hypertension  - COMPLETE METABOLIC PANEL WITH GFR  8. Obesity (BMI 30-39.9)  Discussed with the patient the risk posed by an increased BMI. Discussed importance of portion control, calorie counting and at least 150 minutes of physical activity weekly. Avoid sweet beverages and drink more water. Eat at least 6 servings of fruit and  vegetables daily  She did not respond to victoza and stopped medication   9. Insulin resistance  She stopped Victoza   10. Dyslipidemia  - Lipid panel  11. Vitamin D deficiency disease  - VITAMIN D 25 Hydroxy (Vit-D Deficiency, Fractures)  12. Dyspepsia  - H. pylori breath test

## 2018-06-07 LAB — CBC WITH DIFFERENTIAL/PLATELET
Basophils Absolute: 19 cells/uL (ref 0–200)
Basophils Relative: 0.5 %
Eosinophils Absolute: 141 cells/uL (ref 15–500)
Eosinophils Relative: 3.8 %
HCT: 37.1 % (ref 35.0–45.0)
Hemoglobin: 12.3 g/dL (ref 11.7–15.5)
Lymphs Abs: 1351 cells/uL (ref 850–3900)
MCH: 26.9 pg — ABNORMAL LOW (ref 27.0–33.0)
MCHC: 33.2 g/dL (ref 32.0–36.0)
MCV: 81.2 fL (ref 80.0–100.0)
MPV: 10.8 fL (ref 7.5–12.5)
Monocytes Relative: 11.7 %
Neutro Abs: 1758 cells/uL (ref 1500–7800)
Neutrophils Relative %: 47.5 %
Platelets: 259 10*3/uL (ref 140–400)
RBC: 4.57 10*6/uL (ref 3.80–5.10)
RDW: 12.7 % (ref 11.0–15.0)
Total Lymphocyte: 36.5 %
WBC mixed population: 433 cells/uL (ref 200–950)
WBC: 3.7 10*3/uL — ABNORMAL LOW (ref 3.8–10.8)

## 2018-06-07 LAB — LIPID PANEL
Cholesterol: 195 mg/dL (ref <200)
HDL: 57 mg/dL (ref >50)
LDL Cholesterol (Calc): 121 mg/dL (calc) — ABNORMAL HIGH
Non-HDL Cholesterol (Calc): 138 mg/dL (calc) — ABNORMAL HIGH (ref <130)
Total CHOL/HDL Ratio: 3.4 (calc) (ref <5.0)
Triglycerides: 75 mg/dL (ref <150)

## 2018-06-07 LAB — COMPLETE METABOLIC PANEL WITH GFR
AG Ratio: 1.6 (calc) (ref 1.0–2.5)
ALT: 12 U/L (ref 6–29)
AST: 18 U/L (ref 10–35)
Albumin: 4.4 g/dL (ref 3.6–5.1)
Alkaline phosphatase (APISO): 34 U/L (ref 33–115)
BUN: 13 mg/dL (ref 7–25)
CO2: 28 mmol/L (ref 20–32)
Calcium: 9.3 mg/dL (ref 8.6–10.2)
Chloride: 103 mmol/L (ref 98–110)
Creat: 0.66 mg/dL (ref 0.50–1.10)
GFR, Est African American: 123 mL/min/{1.73_m2} (ref >=60)
GFR, Est Non African American: 106 mL/min/{1.73_m2} (ref >=60)
Globulin: 2.7 g/dL (calc) (ref 1.9–3.7)
Glucose, Bld: 90 mg/dL (ref 65–99)
Potassium: 3.8 mmol/L (ref 3.5–5.3)
Sodium: 137 mmol/L (ref 135–146)
Total Bilirubin: 0.5 mg/dL (ref 0.2–1.2)
Total Protein: 7.1 g/dL (ref 6.1–8.1)

## 2018-06-07 LAB — VITAMIN D 25 HYDROXY (VIT D DEFICIENCY, FRACTURES): Vit D, 25-Hydroxy: 56 ng/mL (ref 30–100)

## 2018-06-07 LAB — H. PYLORI BREATH TEST: H. pylori Breath Test: NOT DETECTED

## 2018-06-15 ENCOUNTER — Ambulatory Visit: Admit: 2018-06-15 | Discharge: 2018-06-15 | Payer: MEDICARE | Attending: Neurology | Primary: Neurology

## 2018-06-15 DIAGNOSIS — E559 Vitamin D deficiency, unspecified: Secondary | ICD-10-CM | POA: Diagnosis not present

## 2018-06-15 DIAGNOSIS — G35 Multiple sclerosis: Secondary | ICD-10-CM | POA: Diagnosis not present

## 2018-06-15 MED ORDER — CHOLECALCIFEROL (VITAMIN D3) 100 MCG (4,000 UNIT) CAPSULE
ORAL_CAPSULE | Freq: Every day | ORAL | 11 refills | 0 days | Status: CP
Start: 2018-06-15 — End: ?

## 2018-06-15 MED ORDER — DIMETHYL FUMARATE 240 MG CAPSULE,DELAYED RELEASE
ORAL_CAPSULE | Freq: Two times a day (BID) | ORAL | 5 refills | 0.00000 days | Status: CP
Start: 2018-06-15 — End: 2018-06-15

## 2018-06-15 MED ORDER — DIMETHYL FUMARATE 120 MG (14)-240 MG (46) CAPSULE,DELAYED RELEASE: each | 0 refills | 0 days | Status: AC

## 2018-06-15 MED ORDER — DIMETHYL FUMARATE 120 MG (14)-240 MG (46) CAPSULE,DELAYED RELEASE: 1 | capsule | Freq: Two times a day (BID) | 0 refills | 0 days | Status: AC

## 2018-06-15 MED ORDER — DIMETHYL FUMARATE 120 MG (14)-240 MG (46) CAPSULE,DELAYED RELEASE
Freq: Two times a day (BID) | ORAL | 0 refills | 0.00000 days | Status: CP
Start: 2018-06-15 — End: 2018-12-04
  Filled 2018-07-18: qty 60, 30d supply, fill #0

## 2018-06-15 MED ORDER — DIMETHYL FUMARATE 240 MG CAPSULE,DELAYED RELEASE: 240 mg | capsule | Freq: Two times a day (BID) | 5 refills | 0 days | Status: AC

## 2018-06-15 NOTE — Unmapped (Addendum)
1) Discontinue Provigil and Gabapentin  2) start taking Vitamin D 4000 U/day  3) start Tecfidera 120 mg two times a day with meals for a week, then 240 mg two times a day.   4) Stop taking copaxone.   5) Please do labs (orgers given to you today) in October 2019 with your PCP and we need to get the result back.   6) We will plan re-baseline MRI studies ( with IV sedation) in December 2019.  7) Please schedule with Yolande Jolly after the MRI, in January 2020 sooner if needed.     ?   In case of:  ? a suspected relapse (new symptoms or worsening existing symptoms, lasting for >24h)  OR  ? a need for an additional appointment for other reasons     Please contact:    [facility] Desk  Phone: [phone]           Sarita Bottom, MD  Clinical Associate Professor of Neurology  [facility], Department of Neurology  Multiple Sclerosis/Neuroimmunology Division  [address] [place], [place], [place] [address]  .......................  Patient Education        dimethyl fumarate  Pronunciation:  dye METH il FUE mar ate  Brand:  Tecfidera  What is the most important information I should know about dimethyl fumarate?  Dimethyl fumarate may cause a serious viral infection of the brain that can lead to disability or death. Call your doctor right away if you have any change in your mental state, decreased vision, weakness on one side of your body, or problems with speech or walking. These symptoms may start gradually and get worse quickly.  What is dimethyl fumarate?  Dimethyl fumarate is used to treat relapsing multiple sclerosis.  Dimethyl fumarate may also be used for purposes not listed in this medication guide.  What should I discuss with my healthcare provider before taking dimethyl fumarate?  You should not use dimethyl fumarate if you are allergic to it.  To make sure dimethyl fumarate is safe for you, tell your doctor if you have:  ?? an active infection; or  ?? low white blood cell (WBC) counts.  It is not known whether dimethyl fumarate will harm an unborn baby. Tell your doctor if you are pregnant or plan to become pregnant while using this medicine.  If you are pregnant, your name may be listed on a pregnancy registry. This is to track the outcome of the pregnancy and to evaluate any effects of dimethyl fumarate on the baby.  It is not known whether dimethyl fumarate passes into breast milk or if it could harm a nursing baby. Tell your doctor if you are breast-feeding a baby.  How should I take dimethyl fumarate?  Your doctor will perform blood tests to make sure you do not have conditions that would prevent you from safely using dimethyl fumarate.  Follow all directions on your prescription label. Your doctor may occasionally change your dose. Do not take this medicine in larger or smaller amounts or for longer than recommended.  You may take dimethyl fumarate with or without food. Taking the medicine with food may help prevent flushing (warmth, itching, or burning sensations).  Do not crush, chew, break, or open a dimethyl fumarate capsule. Swallow it whole.  Dimethyl fumarate is usually given in two different strengths, one for a starter dose and the other for a maintenance dose. The starter dose is usually taken for only 7  days. Follow your doctor's dosing instructions very carefully.  While using dimethyl fumarate, you will need frequent blood tests.  Store at room temperature away from moisture, heat, and light. Throw away any unused capsules 90 days after you first opened the bottle.  What happens if I miss a dose?  Take the missed dose as soon as you remember. Skip the missed dose if it is almost time for your next scheduled dose. Do not take extra medicine to make up the missed dose.  What happens if I overdose?  Seek emergency medical attention or call the Poison Help line at [phone].  What should I avoid while taking dimethyl fumarate?  Follow your doctor's instructions about any restrictions on food, beverages, or activity.  What are the possible side effects of dimethyl fumarate?  Get emergency medical help if you have signs of an allergic reaction: hives; difficult breathing; swelling of your face, lips, tongue, or throat.  Dimethyl fumarate may cause a serious viral infection of the brain that can lead to disability or death. Symptoms may start gradually and get worse quickly. Call your doctor right away if you have:  ?? any change in your mental state;  ?? decreased vision;  ?? weakness on one side of your body; or  ?? problems with speech or walking.  Call your doctor at once if you have:  ?? fever, pain when swallowing, cold or flu symptoms;  ?? severe redness or feelings of warmth, tingling, itching, or burning; or  ?? liver problems --loss of appetite, upper stomach pain (right side), tiredness, dark urine, clay-colored stools, jaundice (yellowing of the skin or eyes).  Common side effects may include:  ?? stomach pain, indigestion;  ?? nausea, vomiting, diarrhea;  ?? rash, itching; or  ?? flushing (warmth, redness, or tingly feeling).  This is not a complete list of side effects and others may occur. Call your doctor for medical advice about side effects. You may report side effects to FDA at 1-800-FDA-1088.  What other drugs will affect dimethyl fumarate?  Other drugs may interact with dimethyl fumarate, including prescription and over-the-counter medicines, vitamins, and herbal products. Tell your doctor about all your current medicines and any medicine you start or stop using.  Where can I get more information?  Your pharmacist can provide more information about dimethyl fumarate.  Remember, keep this and all other medicines out of the reach of children, never share your medicines with others, and use this medication only for the indication prescribed.  Every effort has been made to ensure that the information provided by [employer] ('[employer]') is accurate, up-to-date, and complete, but no guarantee is made to that effect. Drug information contained herein may be time sensitive. [employer] information has been compiled for use by healthcare practitioners and consumers in the [place] and therefore [employer] does not warrant that uses outside of the [place] are appropriate, unless specifically indicated otherwise. [employer]'s drug information does not endorse drugs, diagnose patients or recommend therapy. [employer]'s drug information is an [employer] corporate to assist licensed healthcare practitioners in caring for their patients and/or to serve consumers viewing this service as a supplement to, and not a substitute for, the expertise, skill, knowledge and judgment of healthcare practitioners. The absence of a warning for a given drug or drug combination in no way should be construed to indicate that the drug or drug combination is safe, effective or appropriate for any given patient. [employer] does not assume any responsibility for any  aspect of healthcare administered with the aid of information [employer] provides. The information contained herein is not intended to cover all possible uses, directions, precautions, warnings, drug interactions, allergic reactions, or adverse effects. If you have questions about the drugs you are taking, check with your doctor, nurse or pharmacist.  Copyright 928 348 9677 [employer] Version: 4.01. Revision date: 11/23/2016.  Care instructions adapted under license by [facility]. If you have questions about a medical condition or this instruction, always ask your healthcare professional. [employer] disclaims any warranty or liability for your use of this information.

## 2018-06-15 NOTE — Unmapped (Signed)
[facility]  Multiple Sclerosis/Neuroimmunology Division  Sarita Bottom, MD  Associate Professor of Neurology    DATE OF VISIT: 06/15/18    Re:  Hannah Barr  [address]  [place] [address]  MRN: [id number]  DOB: Jan 04, 1971      Direct entry by: Dr. Sarita Bottom    Visit: First Visit with me      REASON FOR VISIT: Ms. Hannah Barr, a 47 y.o. African American left handed female, is seen in consultation at the [facility], Multiple Sclerosis/Neuroimmunology Division at the request of Clara Doreatha Massed, PA  for the evaluation of MS. Ms. Hannah Barr was last time seen by [name], PA at the [facility], Multiple Sclerosis/Neuroimmunology Division on 04/11/2018.    Assessment:     1. I took a detailed history of the present illness fromMs. Hannah Barr , details on past medical history, family history and social history.  2. I personally reviewed  patient's prior medical records, radiology reports, and laboratory work.    3. I personally reviewed the patient???s prior MRI images  and have discussed  MRI findings with the patient.   4.  I performed neurological examination and T25FW.  5.  Ms. Hannah Barr agreed with the recommended diagnostic plan  6. Potential treatment options have been  discussed with Ms. Hannah Barr, who agreed with the recommended treatment plan.                                                                                                                                                  ?? Multiple sclerosis:  Ms. Hannah Barr, is a 47 y.o. African American left handed female with a history of Anemia, Essential hypertension,GERD, Migraine, MS (multiple sclerosis). She had two disease attacks in 2012, and after had that no clear flare, but has disease activity as shown on the last 05/10/2018 brain MRI. She has been treated with Copaxone since 2012, reports a needle fatigue. Given disease activity and the needle fatigue, there is a need to switch the treatment. We discussed oral MR medications and the patient decided to start with Tecfidera. Please see the detailed plan below.     ?? Vitamin D deficiency:   Completed high dose Vitamin D over 8 weeks. Start taking Vitamin D 4000 U/day:    Plan:     1) Discontinue Provigil (side effects) and Gabapentin (not taking on a regular basis)  2) start taking Vitamin D 4000 U/day: Vitamin D deficiency  3) start Tecfidera 120 mg two times a day with meals for a week, then 240 mg two times a day.   4) Stop taking copaxone.   5) Repeat CBC/diff and LFTS (orgers given to the  patient  today) in October 2019 with the PCP and we need to get the result back.   6) We will plan re-baseline MRI studies ( with IV sedation) in December 2019.  7) Please schedule with Yolande Jolly after the MRI, in January 2020 sooner if needed.     Subjective:     CHIEF COMPLAINTS: fatigue, on and off numbness, cognitive problems (sometimes forgets what she is talking about)     HISTORY OF PRESENT ILLNESS:   Ms. Hannah Barr, is a 47 y.o. African American left handed female with a history of Anemia,     Essential hypertension,GERD, Migraine, MS (multiple sclerosis).    First syptom of MS was in 11/2010 when she fell because her left leg gave out. In 01/2011 she had left sided numbness and was diagnosed with MS at that time. The numbness went away but not completely and still comes and goes occasionally.  Has fatigue ever since. Takes Provigil occasionally. Sometimes falls asleep during the day, has insomnia during the night. Has sleeping problems when she takes Provigil (takes in the morning), the next day she is down because of not having a sleep. Does not take Gabapentin on a regular basis. Last time she took Gabapentin was one month ago.'Sometimes skips Copaxone  injections b/c of needle fatigue. Reports no bladder control problems. She never received IVMP. Has no pregnancy plans, was on birth control. Now is not sexyally active. She has completed Vitamin D 50,000U/week over 8 weeks.     MS DMD history:        She was started on Copaxone approximately 02/11/2011-ongoing .     Please see below the results of the diagnostic work-up performed so far.   ............................................................................................................................................Marland Kitchen  DIAGNOSTIC STUDIES / REVIEW OF RECORDS:    MRI:  05/10/2018: Brain MRI with and w/o contrast, report (COMPARISON: Brain MRI dated 08/14/2016): New rim-enhancing lesion in the anterior left temporal lobe worrisome for active demyelination.Otherwise, scattered white matter lesions compatible with clinical history of multiple sclerosis are grossly unchanged.  05/10/2018: Cervical /thoracic spine MRI with and w/o contrast, report (COMPARISON: MRI 08/14/2016): Stable cervical and thoracic cord lesions. No new lesions or abnormal enhancement identified.  Lumbar puncture:  Not done.     Blood tests:   Office Visit on 04/11/2018   Component Date Value Ref Range Status   ??? Vitamin D Total (25OH) 04/11/2018 16.3* 20.0 - 80.0 ng/mL Final   ??? Creatinine 04/11/2018 0.74  0.60 - 1.00 mg/dL Final   ??? EGFR MDRD Af Amer 04/11/2018 >=60  >=60 mL/min/1.64m2 Final   ??? EGFR MDRD Non Af Amer 04/11/2018 >=60  >=60 mL/min/1.71m2 Final   ??? NMO AQP4 IgG, Serum 04/11/2018 Negative  Negative Final   ??? MOG FACS 04/11/2018 Negative  Negative Final   ??? RPR 04/11/2018 Nonreactive  Nonreactive Final     .............................................................................................................................................        Past Medical History:  Past Medical History:   Diagnosis Date   ??? Anemia    ??? Essential hypertension, benign    ??? GERD (gastroesophageal reflux disease)    ??? Migraine    ??? MS (multiple sclerosis) (CMS-HCC) ALLERGIES:    Allergies   Allergen Reactions   ??? Penicillins Hives       CURRENT MEDICATIONS:    Current Outpatient Medications   Medication Sig Dispense Refill   ??? linaclotide (LINZESS) 145 mcg capsule Take 145 mcg by mouth.     ??? mesalamine (CANASA) 1000 MG suppository Insert  1,000 mg into the rectum nightly.     ??? SUMAtriptan (IMITREX) 100 MG tablet Take 100 mg by mouth.     ??? thioridazine (MELLARIL) 10 MG tablet Take 10 mg by mouth Three (3) times a day.     ??? cholecalciferol, vitamin D3, (VITAMIN D3) 4,000 unit cap Take 4,000 Units by mouth daily. 30 capsule 11   ??? dimethyl fumarate (TECFIDERA) 120 mg (14)- 240 mg (46) CpDR Take 1 capsule by mouth 2 (two) times a day with meals. 60 capsule 0   ??? dimethyl fumarate (TECFIDERA) 240 mg CpDR Take 1 capsule (240 mg total) by mouth 2 (two) times a day with meals. 60 capsule 5   ??? traZODone (DESYREL) 50 MG tablet TAKE 0.5-1 TABLETS (25-50 MG TOTAL) BY MOUTH AT BEDTIME AS NEEDED FOR SLEEP.  3     No current facility-administered medications for this visit.            Past Surgical History:    Past Surgical History:   Procedure Laterality Date   ??? CESAREAN SECTION     ??? FOOT ARTHRODESIS, MODIFIED MCBRIDE Left    ??? GANGLION CYST EXCISION      both hands       Social History:  Social History     Socioeconomic History   ??? Marital status: Single     Spouse name: Not on file   ??? Number of children: 3    ??? Years of education: Not on file   ??? Highest education level: Not on file   Occupational History   ??? Not on file   Social Needs   ??? Financial resource strain: Not on file   ??? Food insecurity:     Worry: Not on file     Inability: Not on file   ??? Transportation needs:     Medical: Not on file     Non-medical: Not on file   Tobacco Use   ??? Smoking status: Never Smoker   ??? Smokeless tobacco: Never Used   Substance and Sexual Activity   ??? Alcohol use: No   ??? Drug use: No   ??? Sexual activity: Not on file   Lifestyle   ??? Physical activity:     Days per week: Not on file     Minutes per session: Not on file   ??? Stress: Not on file   Relationships   ??? Social connections:     Talks on phone: Not on file     Gets together: Not on file     Attends religious service: Not on file     Active member of club or organization: Not on file     Attends meetings of clubs or organizations: Not on file     Relationship status: Not on file   Other Topics Concern   ??? Not on file   Social History Narrative    She lives by herself and 2 children, ages 56     and 29. She has not worked since 01/2011. She used to work as a     Interior and spatial designer. Substance abuse, denies tobacco, alcohol or illicit     drug use.                    Family History:    Family History   Problem Relation Age of Onset   ??? Diabetes Mother    ??? Hypertension Mother    ??? Cancer Father    ??? COPD  Father         Review of Systems:  A 10-systems review was performed and, unless otherwise noted, declared negative by patient.    Objective:     Physical Exam:  Blood pressure 139/89, pulse 90, height 157.5 cm (5' 2.01), weight 76.5 kg (168 lb 9.6 oz), not currently breastfeeding.   General Appearance: in no acute distress. Normal skin color, afebrile.  Heart and lungs: Regular heart rate . Eupneic, normal respiratory rate. Abdomen: Soft, non-tender.No peripheral  edema, peripheral pulses palpable.       NEUROLOGICAL EXAMINATION:     General:  Alert and oriented to person, place, time and situation.    Recent and remote memory intact.    Attention span and concentration normal.    Language and spontaneous speech normal, no dysarthria or aphasia. Naming/fluency/repetition intact.  Fund of knowledge normal.  Following lateralizing commands across midline       Cranial Nerves:     II, III- Pupils are equal and reactive to light b/l (direct and consensual reactions). Visual Acuity: 20/20 Glasses: no No visual field defect.  III, IV, VI- extra ocular movements are intact, No ptosis, no diplopia, no nystagmus.  V- sensation of the face intact b/l.   VII- face symmetrical, no facial droop, normal facial movements with smile/grimace  VIII- Hearing grossly intact. Weber test: sound is symmetrical with no lateralization.  IX and X- symmetric palate contraction, normal gag bilaterally, no dysarthria, no dysphagia.  XI- Full shoulder shrug bilaterally; no wasting, normal tone and strength of sternocleidomastoid muscles bilaterally.  XII- No tongue atrophy, no tongue fasciculations; tongue protrudes midline, full range of movements of the tongue.    Neck flexion normal.    Motor Exam:     Normal bulk. Fasciculations not observed.     Muscle strength:    Muscles UEs  LEs    R L  R L   Deltoids 5/5 5/5 Hip flexors  4+/5 5/5   Biceps 5/5 5/5 Hip extensors 5/5 5/5   Triceps 5/5 5/5 Knee flexors 5/5 5/5   Hand grip 5/5 5/5 Knee extensors 5/5 5/5   Wrist flexors 5/5 5/5 Foot dorsal flexors 5/5 5/5   Wrist extensors 5/5 5/5 Foot plantar flexors 5/5 5/5   Finger flexors 5/5 5/5 Toes dorsal flexors 5/5 5/5   Finger extensors 5/5 5/5 Toes plantar flexors 5/5 5/5        Reflexes R L   Biceps +2 +2   Brachioradialis  +2 +2   Triceps +2 +2   Patella +2 +2   Achilles +2 +2     Normal tone b/l. Negative Babinski sign bilaterally.    Sensory system:  ? Superficial light touch sensation:wnl  ? Vibration sense:decreased in both LEs, UE: WNL  ? Position sense: WNL  ? Pinprick test for pain sensation: WNL  (WNL= within normal limits; UE= upper extremities; LE= lower extremities;   R= right, L= left).    Cerebellar/Coordination:  Mild left hand adiadochokinesis. No limb ataxia bilaterally. No gait ataxia. Romberg negative. Performs a tandem gait.    Gait: Normal stride, base and  armswing. Walk influenced by obesity.     Tests for meningeal irritation: negative.  T25FW:5.15s    .........................................................................................................................................Marland Kitchen    VISIT SUMMARY:  Ms. Hannah Barr, a 47 y.o. female  presented for the follow-up of MS. Ms. Hannah Barr voiced a complete understanding of the diagnostic and treatment plan as detailed above. All questions were answered.  Start of Visit Time: 14:20h  End of Visit Time: 15:10h  Total visit time =  30 minutes    Greater than 50% of the face to face time was spent in consultation on the  disease process, and treatment planning, medication, dosing and side effects.     Thank you for the opportunity to contribute to the care of Ms. Hannah Barr.

## 2018-06-19 MED FILL — TECFIDERA STARTER PACK/STARTER/MISC: TECFIDERA STARTER PACK/STARTER/MISC | 30 days supply | Qty: 60 | Fill #0

## 2018-06-19 NOTE — Unmapped (Signed)
[facility] Specialty Medication Referral: PA APPROVED    Medication (Brand/Generic): TECFIDERA    Initial FSI Test Claim completed with resulted information below:  No PA required  Patient ABLE to fill at [facility]  Insurance Company:  MEDICARE D  Anticipated Copay: $0  Is anticipated copay with a copay card or grant? NO    As Co-pay is under $100 defined limit, per policy there will be no further investigation of need for financial assistance at this time unless patient requests. This referral has been communicated to the provider and handed off to the [facility] team for further processing and filling of prescribed medication.   ______________________________________________________________________  Please utilize this referral for viewing purposes as it will serve as the central location for all relevant documentation and updates.

## 2018-06-19 NOTE — Unmapped (Signed)
[facility] Neurology  Patient Onboarding/Medication Counseling    [name] is a 47 y.o. female with MS who I am counseling today on initiation of therapy.    Medication: Tecfidera Starter Pack    Verified patient's date of birth / HIPAA.  Medications reviewed and verified with patient: Allergies - Medications -    .      Education Provided: ??    Dose/Administration discussed: 120 mg BID x 7 days, then increase to 240 mg BID thereafter. This medication should be taken  with high protein meals. Stressed the importance of taking medication as prescribed and to contact provider if that changes at any time. If dose is missed or unsure if it has been missed, instructed patient to call clinic pharmacist.    Storage requirements: this medicine should be stored at room temperature.     Side effects discussed: Discussed common side effects, including flushing or diarrhea. If patient experiences either of these and is bothersome, they need to call the doctor.  Patient will receive a Lexi-Comp drug information handout with shipment.    Handling precautions reviewed:  n/a.    Drug Interactions: other medications reviewed and up to date in Epic.  No drug interactions identified.    Comorbidities/Allergies: reviewed and up to date in Epic.    Verified therapy is appropriate and should continue      Delivery Information    Medication Assistance provided: Prior Authorization    Anticipated copay of $0 reviewed with patient. Verified delivery address in FSI and reviewed medication storage requirement.    Scheduled delivery date: 06/21/2018     Explained that we ship using UPS and this shipment will not require a signature.      Explained the services we provide at [facility] and that each month we would call to set up refills.  Stressed importance of returning phone calls so that we could ensure they receive their medications in time each month.  Informed patient that we should be setting up refills 7-10 days prior to when they will run out of medication.  Informed patient that welcome packet will be sent.      Patient verbalized understanding of the above information as well as how to contact the pharmacy at [phone] option 4 with any questions/concerns. The pharmacy is open Monday through Friday 8:30am-4:30pm. A pharmacist is available 24/7 via pager to answer any clinical questions they may have.        Patient Specific Needs      ? Patient has no physical, cognitive, or cultural barriers.    ? Patient prefers to have medications discussed with  Patient     ? Patient is able to read and understand education materials at a high school level or above.    ? Patients primary language is  English       Worthy Flank, PharmD, CPP  Clinical Pharmacist, [facility]  Phone: [phone]

## 2018-07-13 NOTE — Unmapped (Signed)
[facility] Special[facility] Pharmacy Refill Coordination Note  Specialty Medication(s): Tecfidera  Additional Medications shipped: n/a    Letta Moynahan, DOB: 23-Jul-1971  Phone: [phone] (home) , Alternate phone contact: N/A  Phone or address changes today?: No  All above HIPAA information was verified with patient.  Shipping Address: [address]  [place] [address]   Insurance changes? No    Completed refill call assessment today to schedule patient's medication shipment from the [facility] [phone]).      Confirmed the medication and dosage are correct and have not changed: Yes, regimen is correct and unchanged.    Confirmed patient started or stopped the following medications in the past month:  No, there are no changes reported at this time.    Are you tolerating your medication?:  [name] reports side effects of possible spasms in head  Will check in with her next month .    ADHERENCE    Is this medicine transplant or covered by Medicare Part B? No.        Did you miss any doses in the past 4 weeks? No missed doses reported.    FINANCIAL/SHIPPING    Delivery Scheduled: Yes, Expected medication delivery date: 07/19/18     The patient will receive a drug information handout for each medication shipped and additional FDA Medication Guides as required.     [name] did not have any additional questions at this time.    Delivery address confirmed in Epic.    We will follow up with patient monthly for standard refill processing and delivery.      Thank you,  Dace Denn Vangie Bicker   [facility] Specialty Pharmacist

## 2018-07-18 ENCOUNTER — Other Ambulatory Visit: Payer: Self-pay | Admitting: Family Medicine

## 2018-07-18 MED FILL — TECFIDERA 240 MG CAPSULE,DELAYED RELEASE: 30 days supply | Qty: 60 | Fill #0 | Status: AC

## 2018-07-18 NOTE — Telephone Encounter (Signed)
Copied from [place] [phone]. Topic: Quick Communication - Rx Refill/Question >> Jul 18, 2018  9:59 AM Reyne Dumas L wrote: Medication: meloxicam (MOBIC) 15 MG tablet   Has the patient contacted their pharmacy? Yes - pharmacy states to contact office.  (Agent: If no, request that the patient contact the pharmacy for the refill.) (Agent: If yes, when and what did the pharmacy advise?)  Preferred Pharmacy (with phone number or street name): [id number] - [place], [address] [phone] (Phone) [phone] (Fax)  Agent: Please be advised that RX refills may take up to 3 business days. We ask that you follow-up with your pharmacy.

## 2018-07-20 NOTE — Unmapped (Signed)
Per Epic, started pack should have been already shipped to the patient.

## 2018-08-11 NOTE — Unmapped (Signed)
Spoke with patient today regarding possible side effects from Tecfidera. She confirmed she is taking 240 mg BID. She states since she has started the medication, she has noticed she has been breaking out on her arms, face and once on her lip. From  Her description this doesn't sound like flushing. She describes it as a single raised bump, almost like a hive, that is only sore when she touches it. Its not itchy, and they come and go. She denies swelling of her face and neck, denies feeling shortness of breath. Denies any changes in diet, soaps, detergent, etc. She states she feels comfortable continuing Tecfidera for now until I speak with [name]. She also has a PCP appointment scheduled next week and plans to show this to her PCP. I asked to please also let us know what her PCP says.    Additionally, she reports having electric like shocks shoot down her legs. She states this happened when she was first diagnosed but after she started on copaxone this stopped. Its now worse then it was initially. Discussed the heat can cause some symptoms of MS to be more active. Additionally, it can take up to 6 months for [place] to see the maximum effectiveness of Tecfidera after starting so this may be due to the transition of DMTs.    Worthy Flank, PharmD, CPP  Clinical Pharmacist, [facility]  Phone: [phone]

## 2018-08-11 NOTE — Unmapped (Signed)
I asked her to send a photo through MyChart.    Hannah Barr

## 2018-08-11 NOTE — Unmapped (Signed)
[facility] Pharmacy Refill Coordination Note  Specialty Medication(s): TECFIDERA [address] [place], [place]: 1971/04/25  Phone: [phone] (home) , Alternate phone contact: N/A  Phone or address changes today?: No  All above HIPAA information was verified with patient.  Shipping Address: [address]  [place] [address]   Insurance changes? No    Completed refill call assessment today to schedule patient's medication shipment from the [facility] [phone]).      Confirmed the medication and dosage are correct and have not changed: Yes, regimen is correct and unchanged.    Confirmed patient started or stopped the following medications in the past month:  No, there are no changes reported at this time.    Are you tolerating your medication?:  [name] reports side effects of ELECTRICAL SENSATION GOING DOWN LEGS, AND RASH ON FACE AND ARMS.    ADHERENCE        Did you miss any doses in the past 4 weeks? No missed doses reported.    FINANCIAL/SHIPPING    Delivery Scheduled: Yes, Expected medication delivery date: 9/18 VIA [facility] [place]     The patient will receive a drug information handout for each medication shipped and additional FDA Medication Guides as required.      TRANSFERRED PT TO STEPHANIE TO DISCUSS SIDE EFFECTS, AS PATIENT IS CONCERNED WITH RASH ON FACE ON ARMS    Delivery address validated in Epic.    We will follow up with patient monthly for standard refill processing and delivery.      Thank you,  Westley Gambles   [facility] Specialty Technician

## 2018-08-15 ENCOUNTER — Ambulatory Visit: Payer: Medicare HMO

## 2018-08-15 MED FILL — TECFIDERA 240 MG CAPSULE,DELAYED RELEASE: 30 days supply | Qty: 60 | Fill #1 | Status: AC

## 2018-08-15 MED FILL — TECFIDERA 240 MG CAPSULE,DELAYED RELEASE: ORAL | 30 days supply | Qty: 60 | Fill #1

## 2018-09-06 ENCOUNTER — Ambulatory Visit: Payer: Medicare HMO | Admitting: Family Medicine

## 2018-09-06 NOTE — Unmapped (Signed)
[facility] Specialty Pharmacy Refill Coordination Note  Specialty Medication(s): TECFIDERA [address] [place], [place]: 1971-06-10  Phone: [phone] (home) , Alternate phone contact: N/A  Phone or address changes today?: No  All above HIPAA information was verified with patient.  Shipping Address: [address]  [place] [address]   Insurance changes? No    Completed refill call assessment today to schedule patient's medication shipment from the [facility] [phone]).      Confirmed the medication and dosage are correct and have not changed: Yes, regimen is correct and unchanged.    Confirmed patient started or stopped the following medications in the past month:  No, there are no changes reported at this time.    Are you tolerating your medication?:  Hannah Barr reports side effects of RASH- REPORTS ALREADY SPOKE WITH THE OFFICE, AND SHE WILL SHOW THEM WHEN SHE COMES INTO THE OFFICE.Marland Kitchen    ADHERENCE    (Below is required for Medicare Part B or Transplant patients only - per drug):   How many tablets were dispensed last month: #60 OR 30 DAYS  Patient currently has 10 DAYS remaining.    Did you miss any doses in the past 4 weeks? No missed doses reported.    FINANCIAL/SHIPPING    Delivery Scheduled: Yes, Expected medication delivery date: 10/15 VIA [facility] [place]     The patient will receive a drug information handout for each medication shipped and additional FDA Medication Guides as required.      Hannah Barr did not have any additional questions at this time.    Delivery address validated in Epic.    We will follow up with patient monthly for standard refill processing and delivery.      Thank you,  Westley Gambles   [facility] Specialty Technician

## 2018-09-11 MED FILL — TECFIDERA 240 MG CAPSULE,DELAYED RELEASE: ORAL | 30 days supply | Qty: 60 | Fill #2

## 2018-09-11 MED FILL — TECFIDERA 240 MG CAPSULE,DELAYED RELEASE: 30 days supply | Qty: 60 | Fill #2 | Status: AC

## 2018-09-25 DIAGNOSIS — M62830 Muscle spasm of back: Secondary | ICD-10-CM | POA: Diagnosis not present

## 2018-09-25 DIAGNOSIS — M9903 Segmental and somatic dysfunction of lumbar region: Secondary | ICD-10-CM | POA: Diagnosis not present

## 2018-09-25 DIAGNOSIS — M5136 Other intervertebral disc degeneration, lumbar region: Secondary | ICD-10-CM | POA: Diagnosis not present

## 2018-09-25 DIAGNOSIS — M9905 Segmental and somatic dysfunction of pelvic region: Secondary | ICD-10-CM | POA: Diagnosis not present

## 2018-10-17 ENCOUNTER — Other Ambulatory Visit: Payer: Self-pay | Admitting: Family Medicine

## 2018-10-17 DIAGNOSIS — E8881 Metabolic syndrome: Secondary | ICD-10-CM

## 2018-10-17 NOTE — Unmapped (Signed)
[facility] Refill Coordination Note  Medication: TECFIDERA    Unable to reach patient to schedule shipment for medication being filled at [facility]. Left voicemail on phone.  As this is the 3rd unsuccessful attempt to reach the patient, no additional phone call attempts will be made at this time.      Phone numbers attempted: [phone]  Last scheduled delivery: SHIPPED 09/11/18    Please call the [facility] at [phone] (option 4) should you have any further questions.      Thanks,  [facility] Specialty Team

## 2018-10-23 MED FILL — TECFIDERA 240 MG CAPSULE,DELAYED RELEASE: 30 days supply | Qty: 60 | Fill #3 | Status: AC

## 2018-10-23 MED FILL — TECFIDERA 240 MG CAPSULE,DELAYED RELEASE: ORAL | 30 days supply | Qty: 60 | Fill #3

## 2018-10-23 NOTE — Unmapped (Signed)
Patient has 2 days of medication on hand  Sending out today to be delivered tomorrow    [facility] Refill Coordination Note    Specialty Medication(s) to be Shipped:   Neurology: Tecfidera    Other medication(s) to be shipped: n/a     Hannah Barr, DOB: 01-02-1971  Phone: [phone] (home)       All above HIPAA information was verified with patient.     Completed refill call assessment today to schedule patient's medication shipment from the [facility] [phone]).       Specialty medication(s) and dose(s) confirmed: Regimen is correct and unchanged.   Changes to medications: Hannah Barr reports no changes reported at this time.  Changes to insurance: No  Questions for the pharmacist: No    The patient will receive a drug information handout for each medication shipped and additional FDA Medication Guides as required.      DISEASE/MEDICATION-SPECIFIC INFORMATION        N/A    ADHERENCE      No missed doses reported at this time        [facility]     Shipping address confirmed in Epic.     Delivery Scheduled: Yes, Expected medication delivery date: 11/26 via UPS or courier.     Medication will be delivered via Next Day Courier to the home address in Epic WAM.    Hannah Barr   [facility] Specialty Technician

## 2018-11-11 DIAGNOSIS — G8929 Other chronic pain: Secondary | ICD-10-CM | POA: Diagnosis not present

## 2018-11-11 DIAGNOSIS — I10 Essential (primary) hypertension: Secondary | ICD-10-CM | POA: Diagnosis not present

## 2018-11-11 DIAGNOSIS — G35 Multiple sclerosis: Secondary | ICD-10-CM | POA: Diagnosis not present

## 2018-11-11 DIAGNOSIS — Z880 Allergy status to penicillin: Secondary | ICD-10-CM | POA: Diagnosis not present

## 2018-11-11 DIAGNOSIS — M545 Low back pain: Secondary | ICD-10-CM | POA: Diagnosis not present

## 2018-11-11 DIAGNOSIS — M2560 Stiffness of unspecified joint, not elsewhere classified: Secondary | ICD-10-CM | POA: Diagnosis not present

## 2018-11-11 MED ORDER — LIDOCAINE 4 % TOPICAL PATCH
MEDICATED_PATCH | Freq: Every day | TRANSDERMAL | 0 refills | 0.00000 days | Status: CP
Start: 2018-11-11 — End: 2018-11-21

## 2018-11-11 MED ORDER — DICLOFENAC 1 % TOPICAL GEL
Freq: Four times a day (QID) | TOPICAL | 0 refills | 0 days | Status: CP
Start: 2018-11-11 — End: 2018-11-24

## 2018-11-11 MED ORDER — MELOXICAM 15 MG TABLET
ORAL_TABLET | Freq: Every day | ORAL | 0 refills | 0 days | Status: CP
Start: 2018-11-11 — End: 2018-11-26

## 2018-11-12 ENCOUNTER — Ambulatory Visit: Admit: 2018-11-12 | Discharge: 2018-11-12 | Disposition: A | Discharge status: Home / Self Care | Payer: MEDICARE

## 2018-11-12 NOTE — Unmapped (Signed)
Patient reports one year or more of lower back pain with worsening symptoms

## 2018-11-12 NOTE — Unmapped (Signed)
[facility]  Provider Note      ED Clinical Impression     Final diagnoses:   None       Initial Impression, ED Course, Assessment and Plan     Impression: Hannah Barr is a 47 y.o. female with a PMH of anemia, HTN, GERD, migraines, and MS presenting for chronic lower back pain and stiffness onset 1 year ago, progressively worsening over the past month, worse when sitting for a long time.     On exam, the patient appears well and is in NAD. VS are WNL. Heart rate and rhythm is regular. Lungs are clear to auscultation bilaterally. Abdomen is soft and non-tender with no guarding or rebound. SLR negative bilaterally. Distal pulses intact. DTR 2/4 bilaterally. Lower lumbar tenderness to palpation right along the iliac crest bilaterally.     Chronic low back pain. No red flag signs. Focally tender to palpation over musculature. Will refer to spine center. Will try vulotram gel, Lidoderm patches, and meloxicam. Patient agrees with plan. NAD. VSS.       Additional Medical Decision Making     I have reviewed the vital signs and the nursing notes. Labs and radiology results that were available during my care of the patient were independently reviewed by me and considered in my medical decision making.   I reviewed the patient's prior medical records.        History     Chief Complaint  Back Pain      HPI   Hannah Barr is a 47 y.o. female with a PMH of anemia, HTN, GERD, migraines, and MS presenting for chronic lower back pain onset 1 year ago, progressively over the past 1 month. She denies any new movements, activities, or stressor on her back. She reports some intermittent radiation of pain down her extremities after sitting for a while, and reports that she feels stuck when sitting before standing up. She has tried many OTC medications, heat pad, compression, ice all with minimal relief. She reports meloxicam helped the most. She has not tried stretching or rehabilitation exercises. She has not tried lidoderm patches, gel. She reports that she was seeing of physical therapist for her MS and back but she reports no relief after 3 day per week sessions. She has had negative XR with PCP ~8 months ago, however her pain has worsened since that time. She has also seen a neurologist and the patient reports that she was given no answers. She endorses occasional night sweats, and is having trouble sleeping 2/2 her pain. Is it easier for her to sleep on her back. Denies fever, weight loss, bladder or bowel incontinence, numbness to pelvis.      Past Medical History:   Diagnosis Date   ??? Anemia    ??? Essential hypertension, benign    ??? GERD (gastroesophageal reflux disease)    ??? Migraine    ??? MS (multiple sclerosis) (CMS-HCC)        Patient Active Problem List   Diagnosis   ??? Hypertension   ??? Multiple sclerosis (CMS-HCC)   ??? MS (multiple sclerosis) (CMS-HCC)   ??? Vitamin D deficiency       Past Surgical History:   Procedure Laterality Date   ??? CESAREAN SECTION     ??? FOOT ARTHRODESIS, MODIFIED MCBRIDE Left    ??? GANGLION CYST EXCISION      both hands       No current facility-administered medications for this encounter.  Current Outpatient Medications:   ???  cholecalciferol, vitamin D3, (VITAMIN D3) 4,000 unit cap, Take 4,000 Units by mouth daily., Disp: 30 capsule, Rfl: 11  ???  dimethyl fumarate 120 mg (14)- 240 mg (46) CpDR, TAKE 1 CAPSULE BY MOUTH TWICE DAILY WITH MEALS AS DIRECTED ON TAKE BY MOUTH AS DIRECTED ON PACKAGE LABELING, Disp: 60 each, Rfl: 0  ???  dimethyl fumarate 240 mg CpDR, TAKE 1 CAPSULE BY MOUTH TWICE DAILY WITH MEALS, Disp: 60 capsule, Rfl: 5  ???  linaclotide (LINZESS) 145 mcg capsule, Take 145 mcg by mouth., Disp: , Rfl:   ???  mesalamine (CANASA) 1000 MG suppository, Insert 1,000 mg into the rectum nightly., Disp: , Rfl:   ???  SUMAtriptan (IMITREX) 100 MG tablet, Take 100 mg by mouth., Disp: , Rfl:   ???  thioridazine (MELLARIL) 10 MG tablet, Take 10 mg by mouth Three (3) times a day., Disp: , Rfl:   ???  traZODone (DESYREL) 50 MG tablet, TAKE 0.5-1 TABLETS (25-50 MG TOTAL) BY MOUTH AT BEDTIME AS NEEDED FOR SLEEP., Disp: , Rfl: 3    Allergies  Penicillins    Family History   Problem Relation Age of Onset   ??? Diabetes Mother    ??? Hypertension Mother    ??? Cancer Father    ??? COPD Father        Social History  Social History     Tobacco Use   ??? Smoking status: Never Smoker   ??? Smokeless tobacco: Never Used   Substance Use Topics   ??? Alcohol use: No   ??? Drug use: No         Review of Systems   Constitutional: Negative for fever or chills.   Respiratory: Negative for cough or shortness of breath.   Cardiovascular: Negative for chest pain.   Gastrointestinal: Negative for nausea, vomiting or diarrhea.   Genitourinary: Negative for difficulty urinating.   Musculoskeletal: Positive for back pain.   Skin: Negative for rash.   Neurological: Negative for headaches.   Hematological: Negative for bruising.    All other systems reviewed and are negative    Physical Exam     VITAL SIGNS:    Vitals:    11/11/18 2005   BP: 173/89   Resp: 16   Temp: 36.4 ??C (97.5 ??F)   SpO2: 99%       Physical Examination: General appearance - nontoxic appearance  Eyes - extraocular eye movements intact  Mouth - mucous membranes moist, pharynx normal without lesions  Neck - Supple, normal range of motion  Chest - Normal respiratory effort,lungs CTA  Neurological - alert, oriented, normal speech, no focal motor findings  MSK/Extremities - Lower lumbar tenderness to palpation right along the PSIS bilaterally. SLR negative bilaterally. peripheral pulses intact bilaterally. No pedal edema. DTR 2/4 bilaterally.   Skin - normal coloration and turgor, no rash      Documentation assistance was provided by Levin Erp, Scribe, on November 11, 2018 at 9:03 PM for Arliss Journey, DO.      I have reviewed the chart and agree with the documentation provided for me by the scribe. Pascal Lux, [place]  11/12/18 2242

## 2018-11-21 MED FILL — TECFIDERA 240 MG CAPSULE,DELAYED RELEASE: 30 days supply | Qty: 60 | Fill #4 | Status: AC

## 2018-11-21 MED FILL — TECFIDERA 240 MG CAPSULE,DELAYED RELEASE: ORAL | 30 days supply | Qty: 60 | Fill #4

## 2018-11-21 NOTE — Unmapped (Signed)
[facility] Refill Coordination Note  Specialty Medication(s): TECFIDERA      Hannah Barr, DOB: 12-02-1970  Phone: [phone] (home) , Alternate phone contact: N/A  Phone or address changes today?: No  All above HIPAA information was verified with patient.  Shipping Address: [address]  [place] [address]   Insurance changes? No    Completed refill call assessment today to schedule patient's medication shipment from the [facility] [phone]).      Confirmed the medication and dosage are correct and have not changed: Yes, regimen is correct and unchanged.    Confirmed patient started or stopped the following medications in the past month:  No, there are no changes reported at this time.    Are you tolerating your medication?:  Hannah Barr reports tolerating the medication.    ADHERENCE    (Below is required for Medicare Part B or Transplant patients only - per drug):   How many tablets were dispensed last month: 30 DAYS  Patient currently has 3 DAYS remaining.    Did you miss any doses in the past 4 weeks? No missed doses reported.    FINANCIAL/SHIPPING    Delivery Scheduled: Yes, Expected medication delivery date: 12/26     Medication will be delivered via Same Day Courier to the home address in [facility].    The patient will receive a drug information handout for each medication shipped and additional FDA Medication Guides as required.      Hannah Barr did not have any additional questions at this time.    We will follow up with patient monthly for standard refill processing and delivery.      Thank you,  Westley Gambles   [facility] Specialty Technician

## 2018-11-24 ENCOUNTER — Telehealth: Payer: Self-pay

## 2018-11-24 ENCOUNTER — Ambulatory Visit (INDEPENDENT_AMBULATORY_CARE_PROVIDER_SITE_OTHER): Payer: Medicare HMO

## 2018-11-24 VITALS — BP 120/80 | HR 115 | Temp 98.0°F | Resp 16 | Ht 62.0 in | Wt 161.6 lb

## 2018-11-24 DIAGNOSIS — Z1159 Encounter for screening for other viral diseases: Secondary | ICD-10-CM

## 2018-11-24 DIAGNOSIS — Z0000 Encounter for general adult medical examination without abnormal findings: Secondary | ICD-10-CM

## 2018-11-24 DIAGNOSIS — G35 Multiple sclerosis: Secondary | ICD-10-CM | POA: Diagnosis not present

## 2018-11-24 DIAGNOSIS — Z1231 Encounter for screening mammogram for malignant neoplasm of breast: Secondary | ICD-10-CM | POA: Diagnosis not present

## 2018-11-24 DIAGNOSIS — G35D Multiple sclerosis, unspecified: Secondary | ICD-10-CM

## 2018-11-24 NOTE — Telephone Encounter (Signed)
Left message for pt with this information and to contact me at [phone] if any questions or call the office.

## 2018-11-24 NOTE — Progress Notes (Signed)
Subjective:   Tammie Lopez is a 47 y.o. female who presents for Medicare Annual (Subsequent) preventive examination.  Review of Systems:   Cardiac Risk Factors include: none     Objective:     Vitals: BP 120/80 (BP Location: Left Arm, Patient Position: Sitting, Cuff Size: Normal)   Pulse (!) 115   Temp 98 F (36.7 C) (Oral)   Resp 16   Ht 5\' 2"  (1.575 m)   Wt 161 lb 9 oz (73.3 kg)   LMP 11/24/2018 (Exact Date)   SpO2 98%   BMI 29.55 kg/m   Body mass index is 29.55 kg/m.  Advanced Directives 11/24/2018 02/07/2018 10/26/2017 09/21/2017 08/09/2017 05/24/2017 12/09/2016  Does Patient Have a Medical Advance Directive? No No No No No No No  Would patient like information on creating a medical advance directive? No - Patient declined - - - - - -    Tobacco Social History   Tobacco Use  Smoking Status Never Smoker  Smokeless Tobacco Never Used  Tobacco Comment   smoking cessation materials not required     Counseling given: Not Answered Comment: smoking cessation materials not required   Clinical Intake:  Pre-visit preparation completed: Yes  Pain : 0-10 Pain Score: 6  Pain Type: Chronic pain Pain Location: Back Pain Orientation: Lower Pain Descriptors / Indicators: Aching Pain Onset: More than a month ago Pain Frequency: Constant Pain Relieving Factors: lidocaine cream  Pain Relieving Factors: lidocaine cream  Nutritional Status: BMI > 30  Obese Nutritional Risks: None Diabetes: No  How often do you need to have someone help you when you read instructions, pamphlets, or other written materials from your doctor or pharmacy?: 1 - Never What is the last grade level you completed in school?: high school graduate  Interpreter Needed?: No  Information entered by :: Clemetine Marker LPN  Past Medical History:  Diagnosis Date  . Allergy   . Constipation   . Diverticula of colon   . Insomnia   . Migraine   . MS (multiple sclerosis) ([place])   . Muscle spasm     Past Surgical History:  Procedure Laterality Date  . BREAST BIOPSY Left 01/06/2018   pending path  . BREAST BIOPSY Left 01/06/2018   pending path  . BUNIONECTOMY    . CESAREAN SECTION N/A 2006  . GANGLION CYST EXCISION Bilateral    Hands   Family History  Problem Relation Age of Onset  . Diabetes Mother   . Hypertension Mother   . Cancer Father 75       Mouth  . COPD Father   . Hypertension Brother   . Diabetes Brother    Social History   Socioeconomic History  . Marital status: Single    Spouse name: Not on file  . Number of children: 3  . Years of education: Not on file  . Highest education level: High school graduate  Occupational History    Comment: bus driver  Social Needs  . Financial resource strain: Not hard at all  . Food insecurity:    Worry: Never true    Inability: Never true  . Transportation needs:    Medical: No    Non-medical: No  Tobacco Use  . Smoking status: Never Smoker  . Smokeless tobacco: Never Used  . Tobacco comment: smoking cessation materials not required  Substance and Sexual Activity  . Alcohol use: No  . Drug use: No  . Sexual activity: Not Currently    Partners:  Male  Lifestyle  . Physical activity:    Days per week: 0 days    Minutes per session: 0 min  . Stress: To some extent  Relationships  . Social connections:    Talks on phone: More than three times a week    Gets together: More than three times a week    Attends religious service: More than 4 times per year    Active member of club or organization: No    Attends meetings of clubs or organizations: Never    Relationship status: Never married  Other Topics Concern  . 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 amitriptyline (ELAVIL) 25 MG tablet Take 1 tablet by mouth as needed for sleep.  Marland Kitchen  diclofenac sodium (VOLTAREN) 1 % GEL Apply topically.  . Dimethyl Fumarate 240 MG CPDR Take by mouth.  . linaclotide (LINZESS) 290 MCG CAPS capsule Take 1 capsule (290 mcg total) by mouth daily.  . Multiple Vitamin (MULTIVITAMIN) tablet Take 1 tablet by mouth daily.  . SUMAtriptan (IMITREX) 100 MG tablet Take 1 tablet (100 mg total) by mouth every 2 (two) hours as needed for migraine. May repeat in 2 hours if headache persists or recurs.  . traZODone (DESYREL) 50 MG tablet Take 0.5-1 tablets (25-50 mg total) by mouth at bedtime as needed for sleep.  . meloxicam (MOBIC) 15 MG tablet Take by mouth.  . [DISCONTINUED] gabapentin (NEURONTIN) 300 MG capsule Take 600 mg by mouth 3 (three) times daily.  . [DISCONTINUED] Glatiramer Acetate (COPAXONE) 40 MG/ML SOSY Inject 40 mg into the skin 3 (three) times a week.  . [DISCONTINUED] modafinil (PROVIGIL) 100 MG tablet Take 1 tablet by mouth daily.   No facility-administered encounter medications on file as of 11/24/2018.     Activities of Daily Living In your present state of health, do you have any difficulty performing the following activities: 11/24/2018 06/06/2018  Hearing? N N  Vision? N N  Difficulty concentrating or making decisions? N N  Walking or climbing stairs? N N  Dressing or bathing? N N  Doing errands, shopping? N N  Preparing Food and eating ? N -  Using the Toilet? N -  In the past six months, have you accidently leaked urine? N -  Do you have problems with loss of bowel control? N -  Managing your Medications? N -  Managing your Finances? N -  Housekeeping or managing your Housekeeping? N -  Some recent data might be hidden    Patient Care Team: Steele Sizer, MD as PCP - General (Family Medicine) Carin Primrose as Physician Assistant (Physician Assistant)    Assessment:   This is a routine wellness examination for Tammie Lopez.  Exercise Activities and Dietary recommendations Current Exercise Habits: The patient  does not participate in regular exercise at present, Exercise limited by: neurologic condition(s)(multiple sclerosis)  Goals    . Increase physical activity     Recommend increasing physical activity to 150 minutes per week       Fall Risk Fall Risk  11/24/2018 06/06/2018 01/10/2018 10/18/2017 05/24/2017  Falls in the past year? 0 No Yes No No  Number falls in past yr: 0 - 1 - -  Injury with Fall? - - No - -   FALL RISK PREVENTION  PERTAINING TO THE HOME:  Any stairs in or around the home WITH handrails? Yes  Home free of loose throw rugs in walkways, pet beds, electrical cords, etc? Yes  Adequate lighting in your home to reduce risk of falls? Yes   ASSISTIVE DEVICES UTILIZED TO PREVENT FALLS:  Life alert? No  Use of a cane, walker or w/c? No  Grab bars in the bathroom? No  Shower chair or bench in shower? No  Elevated toilet seat or a handicapped toilet? No   DME ORDERS:  DME order needed?  No   TIMED UP AND GO:  Was the test performed? Yes .  Length of time to ambulate 10 feet: 5 sec.   GAIT:  Appearance of gait: Gait stead-fast and without the use of an assistive device.   Education: Fall risk prevention has been discussed.  Intervention(s) required? No   Depression Screen PHQ 2/9 Scores 11/24/2018 06/06/2018 06/06/2018 01/10/2018  PHQ - 2 Score 0 0 0 0  PHQ- 9 Score - 4 - -     Cognitive Function     6CIT Screen 11/24/2018  What Year? 0 points  What month? 0 points  What time? 0 points  Count back from 20 0 points  Months in reverse 0 points  Repeat phrase 0 points  Total Score 0    Immunization History  Administered Date(s) Administered  . Influenza-Unspecified 11/19/2013, 11/04/2014  . Tdap 01/29/2013    Qualifies for Shingles Vaccine? Yes  . Due for Shingrix. Education has been provided regarding the importance of this vaccine. Pt has been advised to call insurance company to determine out of pocket expense. Advised may also receive vaccine at local  pharmacy or Health Dept. Verbalized acceptance and understanding.  Tdap: Up to date   Flu Vaccine: Due for Flu vaccine. Does the patient want to receive this vaccine today?  No . Education has been provided regarding the importance of this vaccine but still declined. Advised may receive this vaccine at local pharmacy or Health Dept. Aware to provide a copy of the vaccination record if obtained from local pharmacy or Health Dept. Verbalized acceptance and understanding.  Pneumococcal Vaccine: Due for Pneumococcal vaccine. Does the patient want to receive this vaccine today?  No . Education has been provided regarding the importance of this vaccine but still declined. Advised may receive this vaccine at local pharmacy or Health Dept. Aware to provide a copy of the vaccination record if obtained from local pharmacy or Health Dept. Verbalized acceptance and understanding.   Screening Tests Health Maintenance  Topic Date Due  . MAMMOGRAM  12/29/2018  . PAP SMEAR-Modifier  05/24/2020  . TETANUS/TDAP  01/30/2023  . HIV Screening  Completed  . INFLUENZA VACCINE  Discontinued    Cancer Screenings:  Colorectal Screening: due at age 60  Mammogram: Completed 12/29/17. Repeat every year; Ordered today. Pt provided with contact information and advised to call to schedule appt.   Lung Cancer Screening: (Low Dose CT Chest recommended if Age 69-80 years, 30 pack-year currently smoking OR have quit w/in 15years.) does not qualify.   Lung Cancer Screening Referral: An Epic message has been sent to Burgess Estelle, RN (Oncology Nurse Navigator) regarding the possible need for this exam. Raquel Sarna will review the patient's chart to determine if the patient truly qualifies for the exam. If the patient qualifies, Raquel Sarna will order the Low Dose CT of the chest to facilitate the scheduling of this exam.  Additional Screening:  Hepatitis C Screening:  does qualify; ordered today.  Vision Screening: Recommended annual  ophthalmology exams for early detection of glaucoma and other disorders of the eye. Is the patient up to date with their annual eye exam?  Yes  Who is the provider or what is the name of the office in which the pt attends annual eye exams? [place] Screening: Recommended annual dental exams for proper oral hygiene  Community Resource Referral:  CRR required this visit?  No      Plan:    I have personally reviewed and addressed the Medicare Annual Wellness questionnaire and have noted the following in the patient's chart:  A. Medical and social history B. Use of alcohol, tobacco or illicit drugs  C. Current medications and supplements D. Functional ability and status E.  Nutritional status F.  Physical activity G. Advance directives H. List of other physicians I.  Hospitalizations, surgeries, and ER visits in previous 12 months J.  [place] such as hearing and vision if needed, cognitive and depression L. Referrals and appointments   In addition, I have reviewed and discussed with patient certain preventive protocols, quality metrics, and best practice recommendations. A written personalized care plan for preventive services as well as general preventive health recommendations were provided to patient.   Signed,  Clemetine Marker, LPN Nurse Health Advisor   Nurse Notes: pt c/o lower back pain, possibly MS related. Pt request refills on dexilant and meloxicam. Refill request encounter sent for these medications. Advised pt to schedule follow up appt with Dr. Ancil Boozer due to last visit canceled.

## 2018-11-24 NOTE — Patient Instructions (Addendum)
Tammie Lopez , Thank you for taking time to come for your Medicare Wellness Visit. I appreciate your ongoing commitment to your health goals. Please review the following plan we discussed and let me know if I can assist you in the future.   Screening recommendations/referrals: Mammogram: done 12/29/17 ordered today. Please call [phone] to schedule your mammogram.  Recommended yearly ophthalmology/optometry visit for glaucoma screening and checkup Recommended yearly dental visit for hygiene and checkup  Vaccinations: Influenza vaccine: postponed Tdap vaccine: done 01/29/13 Shingles vaccine: Shingrix discussed. Please contact your pharmacy for coverage information.     Advanced directives: Advance directive discussed with you today. Even though you declined this today please call our office should you change your mind and we can give you the proper paperwork for you to fill out.  Conditions/risks identified: Recommend increasing physical activity to 150 minutes per week.   Next appointment: Please follow up in one year for your Medicare Annual Wellness visit.    Preventive Care 40-64 Years, Female Preventive care refers to lifestyle choices and visits with your health care provider that can promote health and wellness. What does preventive care include?  A yearly physical exam. This is also called an annual well check.  Dental exams once or twice a year.  Routine eye exams. Ask your health care provider how often you should have your eyes checked.  Personal lifestyle choices, including:  Daily care of your teeth and gums.  Regular physical activity.  Eating a healthy diet.  Avoiding tobacco and drug use.  Limiting alcohol use.  Practicing safe sex.  Taking low-dose aspirin daily starting at age 78.  Taking vitamin and mineral supplements as recommended by your health care provider. What happens during an annual well check? The services and screenings done by your health  care provider during your annual well check will depend on your age, overall health, lifestyle risk factors, and family history of disease. Counseling  Your health care provider may ask you questions about your:  Alcohol use.  Tobacco use.  Drug use.  Emotional well-being.  Home and relationship well-being.  Sexual activity.  Eating habits.  Work and work Statistician.  Method of birth control.  Menstrual cycle.  Pregnancy history. Screening  You may have the following tests or measurements:  Height, weight, and BMI.  Blood pressure.  Lipid and cholesterol levels. These may be checked every 5 years, or more frequently if you are over 89 years old.  Skin check.  Lung cancer screening. You may have this screening every year starting at age 6 if you have a 30-pack-year history of smoking and currently smoke or have quit within the past 15 years.  Fecal occult blood test (FOBT) of the stool. You may have this test every year starting at age 107.  Flexible sigmoidoscopy or colonoscopy. You may have a sigmoidoscopy every 5 years or a colonoscopy every 10 years starting at age 46.  Hepatitis C blood test.  Hepatitis B blood test.  Sexually transmitted disease (STD) testing.  Diabetes screening. This is done by checking your blood sugar (glucose) after you have not eaten for a while (fasting). You may have this done every 1-3 years.  Mammogram. This may be done every 1-2 years. Talk to your health care provider about when you should start having regular mammograms. This may depend on whether you have a family history of breast cancer.  BRCA-related cancer screening. This may be done if you have a family history of breast, ovarian,  tubal, or peritoneal cancers.  Pelvic exam and Pap test. This may be done every 3 years starting at age 79. Starting at age 56, this may be done every 5 years if you have a Pap test in combination with an HPV test.  Bone density scan. This is  done to screen for osteoporosis. You may have this scan if you are at high risk for osteoporosis. Discuss your test results, treatment options, and if necessary, the need for more tests with your health care provider. Vaccines  Your health care provider may recommend certain vaccines, such as:  Influenza vaccine. This is recommended every year.  Tetanus, diphtheria, and acellular pertussis (Tdap, Td) vaccine. You may need a Td booster every 10 years.  Zoster vaccine. You may need this after age 39.  Pneumococcal 13-valent conjugate (PCV13) vaccine. You may need this if you have certain conditions and were not previously vaccinated.  Pneumococcal polysaccharide (PPSV23) vaccine. You may need one or two doses if you smoke cigarettes or if you have certain conditions. Talk to your health care provider about which screenings and vaccines you need and how often you need them. This information is not intended to replace advice given to you by your health care provider. Make sure you discuss any questions you have with your health care provider. Document Released: 12/12/2015 Document Revised: 08/04/2016 Document Reviewed: 09/16/2015 Elsevier Interactive Patient Education  2017 [employer] Prevention in the Home Falls can cause injuries. They can happen to people of all ages. There are many things you can do to make your home safe and to help prevent falls. What can I do on the outside of my home?  Regularly fix the edges of walkways and driveways and fix any cracks.  Remove anything that might make you trip as you walk through a door, such as a raised step or threshold.  Trim any bushes or trees on the path to your home.  Use bright outdoor lighting.  Clear any walking paths of anything that might make someone trip, such as rocks or tools.  Regularly check to see if handrails are loose or broken. Make sure that both sides of any steps have handrails.  Any raised decks and  porches should have guardrails on the edges.  Have any leaves, snow, or ice cleared regularly.  Use sand or salt on walking paths during winter.  Clean up any spills in your garage right away. This includes oil or grease spills. What can I do in the bathroom?  Use night lights.  Install grab bars by the toilet and in the tub and shower. Do not use towel bars as grab bars.  Use non-skid mats or decals in the tub or shower.  If you need to sit down in the shower, use a plastic, non-slip stool.  Keep the floor dry. Clean up any water that spills on the floor as soon as it happens.  Remove soap buildup in the tub or shower regularly.  Attach bath mats securely with double-sided non-slip rug tape.  Do not have throw rugs and other things on the floor that can make you trip. What can I do in the bedroom?  Use night lights.  Make sure that you have a light by your bed that is easy to reach.  Do not use any sheets or blankets that are too big for your bed. They should not hang down onto the floor.  Have a firm chair that has side arms.  You can use this for support while you get dressed.  Do not have throw rugs and other things on the floor that can make you trip. What can I do in the kitchen?  Clean up any spills right away.  Avoid walking on wet floors.  Keep items that you use a lot in easy-to-reach places.  If you need to reach something above you, use a strong step stool that has a grab bar.  Keep electrical cords out of the way.  Do not use floor polish or wax that makes floors slippery. If you must use wax, use non-skid floor wax.  Do not have throw rugs and other things on the floor that can make you trip. What can I do with my stairs?  Do not leave any items on the stairs.  Make sure that there are handrails on both sides of the stairs and use them. Fix handrails that are broken or loose. Make sure that handrails are as long as the stairways.  Check any  carpeting to make sure that it is firmly attached to the stairs. Fix any carpet that is loose or worn.  Avoid having throw rugs at the top or bottom of the stairs. If you do have throw rugs, attach them to the floor with carpet tape.  Make sure that you have a light switch at the top of the stairs and the bottom of the stairs. If you do not have them, ask someone to add them for you. What else can I do to help prevent falls?  Wear shoes that:  Do not have high heels.  Have rubber bottoms.  Are comfortable and fit you well.  Are closed at the toe. Do not wear sandals.  If you use a stepladder:  Make sure that it is fully opened. Do not climb a closed stepladder.  Make sure that both sides of the stepladder are locked into place.  Ask someone to hold it for you, if possible.  Clearly mark and make sure that you can see:  Any grab bars or handrails.  First and last steps.  Where the edge of each step is.  Use tools that help you move around (mobility aids) if they are needed. These include:  Canes.  Walkers.  Scooters.  Crutches.  Turn on the lights when you go into a dark area. Replace any light bulbs as soon as they burn out.  Set up your furniture so you have a clear path. Avoid moving your furniture around.  If any of your floors are uneven, fix them.  If there are any pets around you, be aware of where they are.  Review your medicines with your doctor. Some medicines can make you feel dizzy. This can increase your chance of falling. Ask your doctor what other things that you can do to help prevent falls. This information is not intended to replace advice given to you by your health care provider. Make sure you discuss any questions you have with your health care provider. Document Released: 09/11/2009 Document Revised: 04/22/2016 Document Reviewed: 12/20/2014 Elsevier Interactive Patient Education  2017 [employer].

## 2018-11-24 NOTE — Telephone Encounter (Signed)
I have not seen her since July, Dexilant not on active list, she can take otc omeprazole until follow up with Raquel Sarna. Meloxicam not rx by [place]. EC gave her lidocaine patches, and she can take otc naproxen 2 otc bid until follow up with Raquel Sarna.  Thank you

## 2018-11-24 NOTE — Telephone Encounter (Signed)
Pt seen in office today for medicare annual wellness visit. She is requesting refill of meloxicam 15mg  for back pain. She also requested refill for dexilant for reflux but I did not see that anywhere on her medication list. Pt scheduled to see Raelyn Ensign on 12/05/18 for med refill appt. Please advise if you can refill to last until appt. Thank you!

## 2018-11-27 LAB — CBC WITH DIFFERENTIAL/PLATELET
Absolute Monocytes: 335 cells/uL (ref 200–950)
Basophils Absolute: 10 cells/uL (ref 0–200)
Basophils Relative: 0.4 %
Eosinophils Absolute: 20 cells/uL (ref 15–500)
Eosinophils Relative: 0.8 %
HCT: 37.7 % (ref 35.0–45.0)
Hemoglobin: 12.5 g/dL (ref 11.7–15.5)
Lymphs Abs: 750 cells/uL — ABNORMAL LOW (ref 850–3900)
MCH: 26.9 pg — ABNORMAL LOW (ref 27.0–33.0)
MCHC: 33.2 g/dL (ref 32.0–36.0)
MCV: 81.1 fL (ref 80.0–100.0)
MPV: 11.2 fL (ref 7.5–12.5)
Monocytes Relative: 13.4 %
Neutro Abs: 1385 cells/uL — ABNORMAL LOW (ref 1500–7800)
Neutrophils Relative %: 55.4 %
Platelets: 267 10*3/uL (ref 140–400)
RBC: 4.65 10*6/uL (ref 3.80–5.10)
RDW: 13.1 % (ref 11.0–15.0)
Total Lymphocyte: 30 %
WBC: 2.5 10*3/uL — ABNORMAL LOW (ref 3.8–10.8)

## 2018-11-27 LAB — HEPATITIS C ANTIBODY
Hepatitis C Ab: NONREACTIVE
SIGNAL TO CUT-OFF: 0.01 (ref <1.00)

## 2018-11-27 LAB — HEPATIC FUNCTION PANEL
AG Ratio: 1.7 (calc) (ref 1.0–2.5)
ALT: 17 U/L (ref 6–29)
AST: 20 U/L (ref 10–35)
Albumin: 4.7 g/dL (ref 3.6–5.1)
Alkaline phosphatase (APISO): 31 U/L — ABNORMAL LOW (ref 33–115)
Bilirubin, Direct: 0.1 mg/dL (ref 0.0–0.2)
Globulin: 2.7 g/dL (calc) (ref 1.9–3.7)
Indirect Bilirubin: 0.5 mg/dL (calc) (ref 0.2–1.2)
Total Bilirubin: 0.6 mg/dL (ref 0.2–1.2)
Total Protein: 7.4 g/dL (ref 6.1–8.1)

## 2018-11-28 ENCOUNTER — Other Ambulatory Visit: Payer: Self-pay | Admitting: Family Medicine

## 2018-11-28 DIAGNOSIS — D708 Other neutropenia: Secondary | ICD-10-CM

## 2018-12-04 ENCOUNTER — Ambulatory Visit
Admit: 2018-12-04 | Discharge: 2018-12-05 | Payer: MEDICARE | Attending: Physician Assistant | Primary: Physician Assistant

## 2018-12-04 DIAGNOSIS — G35 Multiple sclerosis: Secondary | ICD-10-CM | POA: Diagnosis not present

## 2018-12-04 LAB — CREATININE: EGFR CKD-EPI AA FEMALE: >90 mL/min/{1.73_m2} (ref >=60)

## 2018-12-04 LAB — EGFR CKD-EPI NON-AA FEMALE: Lab: >90

## 2018-12-04 LAB — VITAMIN D, TOTAL (25OH): Lab: 23.7

## 2018-12-04 MED ORDER — DIMETHYL FUMARATE 240 MG CAPSULE,DELAYED RELEASE
ORAL_CAPSULE | ORAL | 5 refills | 0 days | Status: CP
Start: 2018-12-04 — End: 2019-06-29
  Filled 2018-12-22: qty 60, 30d supply, fill #0

## 2018-12-04 NOTE — Unmapped (Addendum)
Return for Schedule follow-up with Hannah Barr in 3 to 3 1/2 months.       ??   In case of:  ?? a suspected relapse (new symptoms or worsening existing symptoms, lasting for >24h)  OR  ?? a need for an additional appointment for other reasons     Please contact:    [facility] Neurology Call Center  Phone: [phone]   Fax: [phone]       [facility], Department of Neurology /  Multiple Sclerosis Division    [address]    [place], [place] [address]

## 2018-12-04 NOTE — Unmapped (Signed)
[facility] [employer] [facility]  Multiple Sclerosis / Neuroimmunology Division  Hannah Barr Hannah Barr [facility]  Physician Assistant    Phone: [phone]  Fax: [phone]  ????  Patient Name: Hannah Barr   Date of Birth: Mar 11, 1971  Medical Record Number: [id number]  [address]  [place] [address]  ??  Direct entry by:  Hannah Blamer, PA-C.  Supervising Physician: Dr. [name].    DATE OF VISIT: December 04, 2018    REASON FOR VISIT: Followup in the Neuroimmunology Clinic for evaluation of relapsing remitting multiple sclerosis. Last seen by Dr. Johnnye Barr 06/15/2018.    ASSESSMENT AND PLAN:   **  Relapsing Remitting Multiple Sclerosis.  -Started Tecfidera 05/2018. Refill.  -Check  Cr,  and Vitamin D 25-OH. CBC/d and LFT's under Care Everywhere. 11/24/2018 ALC = 750, WBC = 2,500, AST/ALT= 20/17.  -Schedule Re-baseline MRI of the  Brain, Cervical  and Thoracic spine with / without contrast.      **  Vitamin D deficiency: 05/2018 Vitamin D 25-OH = 16.3. Treated with  Vitamin D3 50,000 units weekly for 8 weeks and than 4,000 units daily.  **Low back pain. Has appointment with spince center tomorrow. Handout out on McKenzie method / stretches.     Return for Schedule follow-up with Hannah Barr in 3 to 3 1/2 months. Will Review MRI results and check safety labs at this visit. Lives locally.    -Total visit time =   44   Minutes. 1002/ 1046.  Greater than 50% of the face to face time was spent in consultation and treatment planning on the  disease process, medication, dosing and side effects. MRI's reviewed personally by myself and with patient.    INTERVAL HISTORY / CHIEF COMPLAINT:   Flushing with Tecfidera that occurs one hour after taking it and lasts for 15 minutes. No GI side effects.  Reports low back pain. Seen in [place] and referred to [facility] spine center and Physical Therapy.    Patient reports that they are taking DMT appropriately.  Since the last visit patient states that they have not had any clinical flare-ups nor IV steroids.  Denies double vision, pain with eye movement or color desaturation.  Denies urinary leakage, urgency or inability to empty bladder.  Denies fatigue, pain , numbness or tingling.  Denies muscle spasms.  Patient confirms that they are taking Vit D3 supplement.    PRIOR HISTORY: A 48 y.o.  African-American left handed female who was diagnosed with relapsing remitting multiple sclerosis 02/11/2011.  History of Anemia, Essential hypertension,GERD, Migraine.   She was seen as a new patient on 02/10/2011 by Dr. Denice Bors.   Initial symptoms began in 12/2010. This started abruptly with occipital headaches with occasional tension pain in her neck. Two days later, she started having discrimination to light touch along the posterior aspect of her left forearm that radiated into her left shoulder. Over the next several days, her symptoms worsened. She began having numbness in her left arm. She then went to the emergency room at [facility], had MRI of the brain and C-spine, which showed demyelinating abnormalities.     SCREENING LABS:   02/10/2011 : ACE 21, ANA positive, 1:80, RF <6.3, Lyme Serology neg, B12 =  339.  04/11/2018 RPR nonreative, NMO APQ4 IgG and MOG IgG1 neg.    04/11/2018 Baseline:  T25FW= 5.19 seconds.  MMSE = 30.  PHQ9 = 12.      09/20/2017  PHQ9 = 7.    MRI REVIEW:  05/10/2018  MRI of the brain with / without contrast. compared to 08/14/2016: ??There is a new 1.8 x 1.3 cm T2/FLAIR hyperintense lesion in the anterior left temporal lobe with peripheral rim enhancement. Other scattered white matter foci of abnormal increased T2/FLAIR signal are similar appearance of the prior exam.  The optic nerves are normal in appearance.    05/10/2018 MRI of the cervical spine with / without contrast.  compared to 08/14/2016: Increased T2 signal in the left cervical medullary junction and right lateral cord at C2. No other cervical cord signal abnormality detected.    05/17/2018 MRI of the thoracic spine with / without contrast. compared to 08/14/2016: Increased T2 signal within the central thoracic cord at the level of T3, left lateral and posterior central cord at the level of T5-6.  ??No abnormal enhancement is seen  03/29/2015 MRI of the thoracic spine showed a new enhancing lesion.    MS FLARE-UPS: Never received IVMP.    MS MEDICATION HISTORY:   She was started on Copaxone approximately 02/11/2011 - 05/2018.  Tecfidera 05/2018 due to increase in disease activity seen on MRI.     GYN HISTORY:  LNM 07/2016.    REVIEW OF SYSTEMS:   10 systems reviewed and otherwise negative except as noted in HPI.    Office Visit on 12/04/2018   Component Date Value Ref Range Status   ??? Creatinine 12/04/2018 0.59* 0.60 - 1.00 mg/dL Final   ??? EGFR CKD-EPI Non-African American,* 12/04/2018 >90  >=60 mL/min/1.6m2 Final   ??? EGFR CKD-EPI African American, Fem* 12/04/2018 >90  >=60 mL/min/1.43m2 Final   ??? Vitamin D Total (25OH) 12/04/2018 23.7  20.0 - 80.0 ng/mL Final         Patient Active Problem List   Diagnosis   ??? Hypertension   ??? Multiple sclerosis (CMS-HCC)   ??? MS (multiple sclerosis) (CMS-HCC)   ??? Vitamin D deficiency     Current Outpatient Medications   Medication Sig Dispense Refill   ??? cholecalciferol, vitamin D3, (VITAMIN D3) 4,000 unit cap Take 4,000 Units by mouth daily. 30 capsule 11   ??? dimethyl fumarate 240 mg CpDR TAKE 1 CAPSULE BY MOUTH TWICE DAILY WITH MEALS 60 capsule 5   ??? linaclotide (LINZESS) 145 mcg capsule Take 145 mcg by mouth.     ??? mesalamine (CANASA) 1000 MG suppository Insert 1,000 mg into the rectum nightly.     ??? SUMAtriptan (IMITREX) 100 MG tablet Take 100 mg by mouth.     ??? traZODone (DESYREL) 50 MG tablet TAKE 0.5-1 TABLETS (25-50 MG TOTAL) BY MOUTH AT BEDTIME AS NEEDED FOR SLEEP.  3     No current facility-administered medications for this visit.          Allergies   Allergen Reactions   ??? Penicillins Hives     Social History Socioeconomic History   ??? Marital status: Single     Spouse name: Not on file   ??? Number of children: Not on file   ??? Years of education: Not on file   ??? Highest education level: Not on file   Occupational History   ??? Not on file   Social Needs   ??? Financial resource strain: Not on file   ??? Food insecurity:     Worry: Not on file     Inability: Not on file   ??? Transportation needs:     Medical: Not on file  Non-medical: Not on file   Tobacco Use   ??? Smoking status: Never Smoker   ??? Smokeless tobacco: Never Used   Substance and Sexual Activity   ??? Alcohol use: No   ??? Drug use: No   ??? Sexual activity: Not on file   Lifestyle   ??? Physical activity:     Days per week: Not on file     Minutes per session: Not on file   ??? Stress: Not on file   Relationships   ??? Social connections:     Talks on phone: Not on file     Gets together: Not on file     Attends religious service: Not on file     Active member of club or organization: Not on file     Attends meetings of clubs or organizations: Not on file     Relationship status: Not on file   Other Topics Concern   ??? Not on file   Social History Narrative    She lives by herself and 2 children, ages 70     and 51. She has not worked since 01/2011. She used to work as a     Interior and spatial designer. Substance abuse, denies tobacco, alcohol or illicit     drug use.                  VITAL SIGNS:  BP 132/81 (BP Site: L Arm, BP Position: Sitting, BP Cuff Size: Medium)  - Pulse 82  - Ht 157.5 cm (5' 2)  - Wt 75.2 kg (165 lb 12.8 oz)  - LMP 11/24/2018 (Exact Date)  - BMI 30.33 kg/m??     Neurological Examination:   Alert, oriented to person, place and time. Speech with no dysarthria or aphasia.     Cranial Nerves:   II, III- Pupils are equal 2 mm and reactive to light b/l.  III, IV, VI- extra ocular movements are intact, No ptosis, no nystagmus.  V- sensation of the face decreased on the left in all 3 branches.   VII- face symmetrical, no facial droop, normal facial movements with smile/grimace VIII- Hearing grossly intact.  IX and X- symmetric palate contraction, normal gag bilaterally  XI- Full shoulder shrug bilaterally  XII- Tongue protrudes midline, full range of movements of the tongue    Motor Exam:   ??  Muscles UEs ?? LEs   ?? R L ?? R L   Deltoids 5/5 5/5 Hip flexors  5/5 5/5   Biceps 5/5 5/5 Hip extensors 5/5 5/5   Triceps 5/5 5/5 Knee flexors 5/5 5/5   Hand grip 5/5 5/5 Knee extensors 5/5 5/5   Wrist flexors 5/5 5/5 Foot dorsal flexors 5/5 5/5   Wrist extensors 5/5 5/5 Foot plantar flexors 5/5 5/5   Finger flexors 5/5 5/5      Finger extensors 5/5 5/5         Normal bulk and tone.  No clonus.    Reflexes R L   Brachioradialis +2 +2   Biceps +2 +2   Triceps +2 +2   Patella +2 +2   Achilles +2 +2     Negative babinski.    Sensory UEs LEs    R L R L          Pin prick WNL WNL WNL WNL   Vibration WNL WNL WNL WNL   Proprioception WNL WNL WNL WNL     Cerebellar/Coordination:  Rapid alternating movements, finger-to-nose and heel-to-shin  bilaterally  demonstrates no abnormalities.    Gait: Normal stride, base and arm swing. Able to tandem, heel, and toe gait without difficulty.

## 2018-12-05 ENCOUNTER — Ambulatory Visit: Payer: Medicare HMO | Admitting: Family Medicine

## 2018-12-06 DIAGNOSIS — H524 Presbyopia: Secondary | ICD-10-CM | POA: Diagnosis not present

## 2018-12-14 ENCOUNTER — Other Ambulatory Visit: Payer: Self-pay

## 2018-12-14 ENCOUNTER — Encounter: Payer: Self-pay | Admitting: Emergency Medicine

## 2018-12-14 ENCOUNTER — Ambulatory Visit
Admission: EM | Admit: 2018-12-14 | Discharge: 2018-12-14 | Disposition: A | Discharge status: Home / Self Care | Payer: Medicare HMO | Attending: Family Medicine | Admitting: Family Medicine

## 2018-12-14 DIAGNOSIS — J069 Acute upper respiratory infection, unspecified: Secondary | ICD-10-CM | POA: Insufficient documentation

## 2018-12-14 MED ORDER — BENZONATATE 200 MG PO CAPS: ORAL_CAPSULE | ORAL | 0 refills | Status: DC

## 2018-12-14 MED ORDER — FLUTICASONE PROPIONATE 50 MCG/ACT NA SUSP: 2.0000 | Freq: Every day | NASAL | 0 refills | Status: DC

## 2018-12-14 MED ORDER — HYDROCOD POLST-CPM POLST ER 10-8 MG/5ML PO SUER: 5.0000 mL | Freq: Two times a day (BID) | ORAL | 0 refills | Status: DC

## 2018-12-14 NOTE — ED Triage Notes (Signed)
Patient c/o cough and congestion x 7 days. Patient has been taking Robitussin, Dayquil/Nyquil and Sudafed for her symptoms.

## 2018-12-14 NOTE — ED Provider Notes (Signed)
[facility]    CSN: [id number] Arrival date & time: 12/14/18  0815     History   Chief Complaint Chief Complaint  Patient presents with  . Cough    HPI Tammie Lopez is a 48 y.o. female.   HPI  48 year old female presents with a cough and congestion  for 7 days.  Cough is productive for thick yellow sputum.  He is a non-smoker.  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  Been taking Robitussin DayQuil NyQuil and Sudafed but has not had much relief.  Been complaining of alternating episodes of hot and cold flashes.         Past Medical History:  Diagnosis Date  . Allergy   . Constipation   . Diverticula of colon   . Insomnia   . Migraine   . MS (multiple sclerosis) ([place])   . Muscle spasm     Patient Active Problem List   Diagnosis Date Noted  . Obesity (BMI 30-39.9) 12/06/2017  . Insulin resistance 09/27/2017  . Essential hypertension 09/21/2017  . Right sided sciatica 09/21/2017  . Major depression, recurrent ([place]) 05/24/2017  . Migraine without aura, not intractable 12/09/2016  . Nickel allergy 12/09/2016  . GERD without esophagitis 12/09/2016  . Other neutropenia ([place]) 12/09/2016  . Dyslipidemia 12/09/2016  . Diverticulosis 12/09/2016  . 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 Allergic rhinitis, seasonal 12/09/2016  . Chronic constipation 12/09/2016  . Vitamin D deficiency 08/06/2013  . MS (multiple sclerosis) ([place]) 04/17/2013    Past Surgical History:  Procedure Laterality Date  . BREAST BIOPSY Left 01/06/2018   pending path  . BREAST BIOPSY Left 01/06/2018   pending path  . BUNIONECTOMY    . CESAREAN SECTION N/A 2006  . GANGLION CYST EXCISION Bilateral    Hands    OB History   No obstetric history on file.      Home Medications    Prior to Admission medications   Medication Sig Start Date End Date Taking? Authorizing Provider  amitriptyline (ELAVIL) 25 MG tablet Take 1 tablet by mouth as  needed for sleep. 09/21/17  Yes [provider]  Dimethyl Fumarate 240 MG CPDR Take by mouth. 06/15/18 06/15/19 Yes [provider]  linaclotide (LINZESS) 290 MCG CAPS capsule Take 1 capsule (290 mcg total) by mouth daily. 06/06/18  Yes Sowles, Drue Stager, MD  Multiple Vitamin (MULTIVITAMIN) tablet Take 1 tablet by mouth daily.   Yes [provider]  SUMAtriptan (IMITREX) 100 MG tablet Take 1 tablet (100 mg total) by mouth every 2 (two) hours as needed for migraine. May repeat in 2 hours if headache persists or recurs. 06/06/18  Yes Sowles, Drue Stager, MD  traZODone (DESYREL) 50 MG tablet Take 0.5-1 tablets (25-50 mg total) by mouth at bedtime as needed for sleep. 06/06/18  Yes Sowles, Drue Stager, MD  benzonatate (TESSALON) 200 MG capsule Take one cap TID PRN cough 12/14/18   Lorin Picket, PA-C  chlorpheniramine-HYDROcodone [facility]) 10-8 MG/5ML SUER Take 5 mLs by mouth 2 (two) times daily. 12/14/18   Lorin Picket, PA-C  fluticasone (FLONASE) 50 MCG/ACT nasal spray Place 2 sprays into both nostrils daily. 12/14/18   Lorin Picket, PA-C    Family History Family History  Problem Relation Age of Onset  . Diabetes Mother   . Hypertension Mother   . Cancer Father 75       Mouth  . COPD Father   .  Hypertension Brother   . Diabetes Brother     Social History Social History   Tobacco Use  . Smoking status: Never Smoker  . Smokeless tobacco: Never Used  . Tobacco comment: smoking cessation materials not required  Substance Use Topics  . Alcohol use: No  . Drug use: No     Allergies   Penicillins   Review of Systems Review of Systems  Constitutional: Positive for activity change and chills. Negative for appetite change, diaphoresis, fatigue and fever.  HENT: Positive for congestion and rhinorrhea.   Respiratory: Positive for cough.   All other systems reviewed and are negative.    Physical Exam Triage Vital Signs ED Triage Vitals  Enc  Vitals Group     BP 12/14/18 0826 (!) 150/94     Pulse Rate 12/14/18 0826 97     Resp 12/14/18 0826 18     Temp 12/14/18 0826 98.2 F (36.8 C)     Temp Source 12/14/18 0826 Oral     SpO2 12/14/18 0826 97 %     Weight 12/14/18 0827 160 lb (72.6 kg)     Height 12/14/18 0827 5\' 2"  (1.575 m)     Head Circumference --      Peak Flow --      Pain Score 12/14/18 0827 0     Pain Loc --      Pain Edu? --      Excl. in [place]? --    No data found.  Updated Vital Signs BP (!) 150/94 (BP Location: Left Arm)   Pulse 97   Temp 98.2 F (36.8 C) (Oral)   Resp 18   Ht 5\' 2"  (1.575 m)   Wt 160 lb (72.6 kg)   LMP 11/24/2018 (Exact Date)   SpO2 97%   BMI 29.26 kg/m   Visual Acuity Right Eye Distance:   Left Eye Distance:   Bilateral Distance:    Right Eye Near:   Left Eye Near:    Bilateral Near:     Physical Exam Constitutional:      General: She is not in acute distress.    Appearance: Normal appearance. She is not ill-appearing, toxic-appearing or diaphoretic.  HENT:     Head: Normocephalic.     Right Ear: Tympanic membrane and ear canal normal.     Left Ear: Tympanic membrane and ear canal normal.     Nose: Nose normal.     Mouth/Throat:     Mouth: Mucous membranes are moist.     Pharynx: No oropharyngeal exudate or posterior oropharyngeal erythema.  Eyes:     General:        Right eye: No discharge.        Left eye: No discharge.     Conjunctiva/sclera: Conjunctivae normal.  Neck:     Musculoskeletal: Normal range of motion.  Pulmonary:     Effort: Pulmonary effort is normal.     Breath sounds: Normal breath sounds.  Musculoskeletal: Normal range of motion.  Lymphadenopathy:     Cervical: No cervical adenopathy.  Skin:    General: Skin is warm and dry.  Neurological:     General: No focal deficit present.     Mental Status: She is alert and oriented to person, place, and time.  Psychiatric:        Mood and Affect: Mood normal.        Behavior: Behavior normal.         Thought Content: Thought content normal.  Judgment: Judgment normal.      UC Treatments / Results  Labs (all labs ordered are listed, but only abnormal results are displayed) Labs Reviewed - No data to display  EKG None  Radiology No results found.  Procedures Procedures (including critical care time)  Medications Ordered in UC Medications - No data to display  Initial Impression / Assessment and Plan / UC Course  I have reviewed the triage vital signs and the nursing notes.  Pertinent labs & imaging results that were available during my care of the patient were reviewed by me and considered in my medical decision making (see chart for details).   I told this patient that this is likely a viral illness does not require antibiotics.  Treat her cough symptomatically.  Appropriate precautions regarding the use of Tussionex.  Encouraged her to drink plenty of fluids and rest as much as possible.  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  She is not improving she should follow-up with her primary care physician   Final Clinical Impressions(s) / UC Diagnoses   Final diagnoses:  Acute upper respiratory infection     Discharge Instructions     Drink plenty of fluids.  Rest as much as possible.  Use Tylenol Motrin for fever chills or body aches.   ED Prescriptions    Medication Sig Dispense Auth. Provider   benzonatate (TESSALON) 200 MG capsule Take one cap TID PRN cough 30 capsule Lorin Picket, PA-C   chlorpheniramine-HYDROcodone (TUSSIONEX PENNKINETIC ER) 10-8 MG/5ML SUER Take 5 mLs by mouth 2 (two) times daily. 115 mL Crecencio Mc P, PA-C   fluticasone (FLONASE) 50 MCG/ACT nasal spray Place 2 sprays into both nostrils daily. 16 g Lorin Picket, PA-C     Controlled Substance Prescriptions [place] Controlled Substance Registry consulted? Not Applicable   Lorin Picket,  PA-C 12/14/18 1616

## 2018-12-14 NOTE — Discharge Instructions (Addendum)
Drink plenty of fluids.  Rest as much as possible.  Use Tylenol Motrin for fever chills or body aches.

## 2018-12-21 NOTE — Unmapped (Signed)
[facility] Specialty Pharmacy Refill Coordination Note  Specialty Medication(s): TECFIDERA      Hannah Barr, DOB: [birth year]  Phone: [phone] (home) , Alternate phone contact: N/A  Phone or address changes today?: No  All above HIPAA information was verified with patient.  Shipping Address: [address]  [place] [address]   Insurance changes? No    Completed refill call assessment today to schedule patient's medication shipment from the [facility] [phone]).      Confirmed the medication and dosage are correct and have not changed: Yes, regimen is correct and unchanged.    Confirmed patient started or stopped the following medications in the past month:  No, there are no changes reported at this time.    Are you tolerating your medication?:  Hannah Barr reports tolerating the medication.    ADHERENCE    (Below is required for Medicare Part B or Transplant patients only - per drug):   How many tablets were dispensed last month: 30  Patient currently has 3 remaining.    Did you miss any doses in the past 4 weeks? No missed doses reported.    FINANCIAL/SHIPPING    Delivery Scheduled: Yes, Expected medication delivery date: 12/22/2018     Medication will be delivered via Same Day Courier to the home address in [place].    The patient will receive a drug information handout for each medication shipped and additional FDA Medication Guides as required.      Hannah Barr did not have any additional questions at this time.    We will follow up with patient monthly for standard refill processing and delivery.      Thank you,  Westley Gambles   [facility] Specialty Technician

## 2018-12-22 MED FILL — TECFIDERA 240 MG CAPSULE,DELAYED RELEASE: 30 days supply | Qty: 60 | Fill #0 | Status: AC

## 2018-12-27 NOTE — Unmapped (Signed)
Specialty Pharmacy - Neurology Medication Clinical Assessment     Hannah Barr is a 48 y.o. female contacted today regarding her specialty medication(s) dimethyl fumarate (TECFIDERA)    Verified patient's date of birth / HIPAA.    Medications reviewed and verified with patient: Allergies - Medications -      Specialty medication(s) and dose(s) confirmed: yes  Changes to medications: no  Changes to insurance: no     Is therapy still appropriate given the disease, patient response, and medical condition? yes  Is therapy still effective? yes    Medication Adherence    Patient reported X missed doses in the last month:  0  Specialty Medication:  Tecfidera 240 mg  Patient is on additional specialty medications:  No  Patient is on more than two specialty medications:  No  Any gaps in refill history greater than 2 weeks in the last 3 months:  no  Demonstrates understanding of importance of adherence:  yes  Informant:  patient  Reliability of informant:  fairly reliable  Provider-estimated medication adherence level:  good  Patient is at risk for Non-Adherence:  No  Reasons for non-adherence:  no problems identified           Adverse Effects    [place]:  Pos  *All other systems reviewed and are negative       Drug Interactions    Drug interactions evaluated:  yes  Clinically relevant drug interactions identified:  no  Provided the patient with educational material regarding drug interactions:  not applicable       Patient Counseling    Counseled the patient on the following:  doses and administration discussed, possible adverse effects and management discussed, adherence and missed doses discussed       The patient will receive an Epic Wam print out for each medication shipped and additional FDA Medication Guides as required. Patient education from [place] or Robet Leu may also be included in the shipment.     Barbette Hair, PharmD, MPH, BCPS  PGY2 Ambulatory Care Pharmacy Resident    Worthy Flank, PharmD, CPP Clinical Pharmacist, [facility]  Phone: [phone]

## 2018-12-29 DIAGNOSIS — H11002 Unspecified pterygium of left eye: Secondary | ICD-10-CM | POA: Diagnosis not present

## 2019-01-16 NOTE — Unmapped (Signed)
[facility] Specialty Pharmacy Refill Coordination Note  Specialty Medication(s): TECFIDERA      Hannah Barr, DOB: 24-Jun-1971  Phone: [phone] (home) , Alternate phone contact: N/A  Phone or address changes today?: No  All above HIPAA information was verified with patient.  Shipping Address: [address]  [place] [address]   Insurance changes? No    Completed refill call assessment today to schedule patient's medication shipment from the [facility] [phone]).      Confirmed the medication and dosage are correct and have not changed: Yes, regimen is correct and unchanged.    Confirmed patient started or stopped the following medications in the past month:  No, there are no changes reported at this time.    Are you tolerating your medication?:  Hannah Barr reports tolerating the medication.    ADHERENCE        Did you miss any doses in the past 4 weeks? No missed doses reported.    FINANCIAL/SHIPPING    Delivery Scheduled: Yes, Expected medication delivery date: 2/21     Medication will be delivered via Next Day Courier to the home address in [facility].    The patient will receive a drug information handout for each medication shipped and additional FDA Medication Guides as required.      Hannah Barr did not have any additional questions at this time.    We will follow up with patient monthly for standard refill processing and delivery.      Thank you,  Westley Gambles   [facility] [place] [facility] Specialty Technician

## 2019-01-18 MED FILL — TECFIDERA 240 MG CAPSULE,DELAYED RELEASE: ORAL | 30 days supply | Qty: 60 | Fill #1

## 2019-01-18 MED FILL — TECFIDERA 240 MG CAPSULE,DELAYED RELEASE: 30 days supply | Qty: 60 | Fill #1 | Status: AC

## 2019-01-29 ENCOUNTER — Ambulatory Visit: Payer: Medicare HMO | Admitting: Family Medicine

## 2019-02-07 NOTE — Unmapped (Signed)
[facility] Specialty Pharmacy Refill Coordination Note    Specialty Medication(s) to be Shipped:   Neurology: Tecfidera    Other medication(s) to be shipped: n/a     Hannah Barr, DOB: October 06, 1971  Phone: [phone] (home)       All above HIPAA information was verified with patient.     Completed refill call assessment today to schedule patient's medication shipment from the [facility] [phone]).       Specialty medication(s) and dose(s) confirmed: Regimen is correct and unchanged.   Changes to medications: Hannah Barr reports no changes reported at this time.  Changes to insurance: No  Questions for the pharmacist: No    Confirmed patient received Welcome Packet with first shipment. The patient will receive a drug information handout for each medication shipped and additional FDA Medication Guides as required.       DISEASE/MEDICATION-SPECIFIC INFORMATION        N/A    SPECIALTY MEDICATION ADHERENCE     Medication Adherence    Patient reported X missed doses in the last month:  0  Specialty Medication:  Tecfidera 240mg   Patient is on additional specialty medications:  No  Patient is on more than two specialty medications:  No  Any gaps in refill history greater than 2 weeks in the last 3 months:  no  Demonstrates understanding of importance of adherence:  yes  Informant:  patient                Tecfidera 240mg  : Patient has 14 days of medication on hand       SHIPPING     Shipping address confirmed in Epic.     Delivery Scheduled: Yes, Expected medication delivery date: 02/16/19.     Medication will be delivered via Next Day Courier to the home address in Epic WAM.    Hannah Barr   [facility] Specialty Technician

## 2019-02-15 MED FILL — TECFIDERA 240 MG CAPSULE,DELAYED RELEASE: 30 days supply | Qty: 60 | Fill #2 | Status: AC

## 2019-02-15 MED FILL — TECFIDERA 240 MG CAPSULE,DELAYED RELEASE: ORAL | 30 days supply | Qty: 60 | Fill #2

## 2019-02-27 ENCOUNTER — Telehealth: Payer: Self-pay

## 2019-02-27 ENCOUNTER — Encounter: Payer: Self-pay | Admitting: Family Medicine

## 2019-02-27 NOTE — Telephone Encounter (Signed)
MS does not qualify

## 2019-02-27 NOTE — Telephone Encounter (Signed)
Copied from [place] [id number]. 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

## 2019-02-27 NOTE — Telephone Encounter (Signed)
appt made for 4.1.2020. informed to download webex and a nurse will give her a call back to give her an access code.

## 2019-02-28 ENCOUNTER — Ambulatory Visit (INDEPENDENT_AMBULATORY_CARE_PROVIDER_SITE_OTHER): Payer: Medicare HMO | Admitting: Family Medicine

## 2019-02-28 ENCOUNTER — Encounter: Payer: Self-pay | Admitting: Family Medicine

## 2019-02-28 DIAGNOSIS — F325 Major depressive disorder, single episode, in full remission: Secondary | ICD-10-CM

## 2019-02-28 DIAGNOSIS — K5909 Other constipation: Secondary | ICD-10-CM

## 2019-02-28 DIAGNOSIS — R69 Illness, unspecified: Secondary | ICD-10-CM | POA: Diagnosis not present

## 2019-02-28 DIAGNOSIS — K219 Gastro-esophageal reflux disease without esophagitis: Secondary | ICD-10-CM | POA: Diagnosis not present

## 2019-02-28 DIAGNOSIS — E8881 Metabolic syndrome: Secondary | ICD-10-CM | POA: Diagnosis not present

## 2019-02-28 DIAGNOSIS — G35 Multiple sclerosis: Secondary | ICD-10-CM

## 2019-02-28 DIAGNOSIS — D708 Other neutropenia: Secondary | ICD-10-CM | POA: Diagnosis not present

## 2019-02-28 DIAGNOSIS — J302 Other seasonal allergic rhinitis: Secondary | ICD-10-CM | POA: Diagnosis not present

## 2019-02-28 MED ORDER — SEMAGLUTIDE(0.25 OR 0.5MG/DOS) 2 MG/1.5ML ~~LOC~~ SOPN: 0.5000 mg | PEN_INJECTOR | SUBCUTANEOUS | 0 refills | Status: DC

## 2019-02-28 MED ORDER — OMEPRAZOLE 40 MG PO CPDR: 40.0000 mg | DELAYED_RELEASE_CAPSULE | Freq: Every day | ORAL | 3 refills | Status: DC

## 2019-02-28 MED ORDER — DIMETHYL FUMARATE 240 MG PO CPDR: 240.0000 mg | DELAYED_RELEASE_CAPSULE | Freq: Two times a day (BID) | ORAL | 0 refills | Status: DC

## 2019-02-28 MED ORDER — LINACLOTIDE 290 MCG PO CAPS: 290.0000 ug | ORAL_CAPSULE | Freq: Every day | ORAL | 2 refills | Status: DC

## 2019-02-28 MED ORDER — FLUTICASONE PROPIONATE 50 MCG/ACT NA SUSP: 2.0000 | Freq: Every day | NASAL | 0 refills | Status: DC

## 2019-02-28 NOTE — Progress Notes (Signed)
Name: Tammie Lopez   MRN: [id number]    DOB: June 01, 1971   Date:02/28/2019       Progress Note  Subjective  Chief Complaint  Chief Complaint  Patient presents with  . Hypertension  . Gastroesophageal Reflux    needs medication, has had dexilant in the past.  . Letter for School/Work    in regards to her being high risk to covid-19 because of MS.  . Medication Refill    I connected with@ on 02/28/19 at 10:00 AM EDT by a video enabled telemedicine application and verified that I am speaking with the correct person using two identifiers.  I discussed the limitations of evaluation and management by telemedicine and the availability of in person appointments. The patient expressed understanding and agreed to proceed. Staff also discussed with the patient that there may be a patient responsible charge related to this service. Patient Location: home  Provider Location: [facility]   HPI  HTN:she stopped taking medications, bp has been dropping. 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 BPat home was 130/88 this am, but usually lower than that, she states she did not sleep well last night.   MS: she is stable, seeing neurologist at [facility] , off  Copaxone, provigil, gabapentin , she is still on nortriptyline and Tecfidera at this time. She still has intermittent numbness on left side but not as bad and not as frequent. No bowel or bladder incontinence. 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 She needs to go in and out of the bus. She took this week off , not sure if secondary to heat, MS or allergies.   Insulin resistance: shewas onTrulicity however not losing weight, shetried metformin but did not help either,  we tried Victoza but she stopped it because it did not work. Morbid obesity; she states her weight now is 168 lbs, she states she is not drinking sodas and  is cutting down on carbs and has been drinking more water and is also walking 45 minutes on her treadmill 4-5 times a week, she is frustrated because she has been gaining weight ( she started 3 weeks ago)   she is willing to try ozempic.   Migraine headaches: usually before her cycles and also one more time per month, takes imitrex prn. Migraine is described as throbbing, behind her right eye. She has associated nausea, no vomtiing, also has photophobia and phonophobia.She does not need refills at this time  Insomnia: still has difficulty staying asleep, and sometimes falling asleep, not taking trazodone every night. When she takes it she sleeps well. She does not like taking it every night and sometimes does not fall asleep until 4am, advised to take medication every night.   Leucopenia: we need to recheck level, however, however we will wait until COVID-19 passes.   Chronic constipation: she takes Linzess daily, with medication she is able to have a bowel movement daily , however she can go days without a bowel movement when off medication   MMD: she used to take medications, phqh 9 is negative, she got very overwhelmed with diagnoses of MS  Patient Active Problem List   Diagnosis Date Noted  . Obesity (BMI 30-39.9) 12/06/2017  . Insulin resistance 09/27/2017  . Essential hypertension 09/21/2017  . Right sided sciatica 09/21/2017  . Major depression, recurrent ([place]) 05/24/2017  .  Migraine without aura, not intractable 12/09/2016  . Nickel allergy 12/09/2016  . GERD without esophagitis 12/09/2016  . Other neutropenia ([place]) 12/09/2016  . Dyslipidemia 12/09/2016  . Diverticulosis 12/09/2016  . 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 Allergic rhinitis, seasonal 12/09/2016  . Chronic constipation 12/09/2016  . Vitamin D deficiency 08/06/2013  . MS (multiple sclerosis) ([place]) 04/17/2013    Past Surgical History:  Procedure Laterality Date  . BREAST BIOPSY Left 01/06/2018   pending path  . BREAST  BIOPSY Left 01/06/2018   pending path  . BUNIONECTOMY    . CESAREAN SECTION N/A 2006  . GANGLION CYST EXCISION Bilateral    Hands    Family History  Problem Relation Age of Onset  . Diabetes Mother   . Hypertension Mother   . Cancer Father 75       Mouth  . COPD Father   . Hypertension Brother   . Diabetes Brother     Social History   Socioeconomic History  . Marital status: Single    Spouse name: Not on file  . Number of children: 3  . Years of education: Not on file  . Highest education level: High school graduate  Occupational History    Comment: bus driver  Social Needs  . Financial resource strain: Not hard at all  . Food insecurity:    Worry: Never true    Inability: Never true  . Transportation needs:    Medical: No    Non-medical: No  Tobacco Use  . Smoking status: Never Smoker  . Smokeless tobacco: Never Used  . Tobacco comment: smoking cessation materials not required  Substance and Sexual Activity  . Alcohol use: No  . Drug use: No  . Sexual activity: Not Currently    Partners: Male  Lifestyle  . Physical activity:    Days per week: 5 days    Minutes per session: 40 min  . Stress: To some extent  Relationships  . Social connections:    Talks on phone: More than three times a week    Gets together: More than three times a week    Attends religious service: More than 4 times per year    Active member of club or organization: No    Attends meetings of clubs or organizations: Never    Relationship status: Never married  . Intimate partner violence:    Fear of current or ex partner: No    Emotionally abused: No    Physically abused: No    Forced sexual activity: No  Other Topics Concern  . 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  amitriptyline (ELAVIL) 25 MG tablet, Take 1 tablet by mouth as needed for  sleep., Disp: , Rfl: 2 .  Multiple Vitamin (MULTIVITAMIN) tablet, Take 1 tablet by mouth daily., Disp: , Rfl:  .  SUMAtriptan (IMITREX) 100 MG tablet, Take 1 tablet (100 mg total) by mouth every 2 (two) hours as needed for migraine. May repeat in 2 hours if headache persists or recurs., Disp: 9 tablet, Rfl: 1 .  traZODone (DESYREL) 50 MG tablet, Take 0.5-1 tablets (25-50 mg total) by mouth at bedtime as needed for sleep., Disp: 30 tablet, Rfl: 3 .  Dimethyl Fumarate (TECFIDERA) 240 MG CPDR, Take 1 capsule (240 mg total) by mouth 2 (two) times daily., Disp: 60  capsule, Rfl: 0 .  fluticasone (FLONASE) 50 MCG/ACT nasal spray, Place 2 sprays into both nostrils daily., Disp: 16 g, Rfl: 0 .  linaclotide (LINZESS) 290 MCG CAPS capsule, Take 1 capsule (290 mcg total) by mouth daily., Disp: 30 capsule, Rfl: 2 .  omeprazole (PRILOSEC) 40 MG capsule, Take 1 capsule (40 mg total) by mouth daily., Disp: 30 capsule, Rfl: 3 .  Semaglutide,0.25 or 0.5MG /DOS, (OZEMPIC, 0.25 OR 0.5 MG/DOSE,) 2 MG/1.5ML SOPN, Inject 0.5 mg into the skin once a week. Start at 0.25 for 3 weeks after that 0.5 weekly, Disp: 3 mL, Rfl: 0  Allergies  Allergen Reactions  . Penicillins Hives    I personally reviewed active problem list, medication list, allergies, family history, social history with the patient/caregiver today.   ROS  Constitutional: Negative for fever, positive for  weight change.  Respiratory: Negative for cough and shortness of breath.   Cardiovascular: Negative for chest pain or palpitations.  Gastrointestinal: Negative for abdominal pain, no bowel changes.  Musculoskeletal: Negative for gait problem or joint swelling.  Skin: Negative for rash.  Neurological: Negative for dizziness , positive for headache.  No other specific complaints in a complete review of systems (except as listed in HPI above).  Objective  Virtual encounter, vitals not obtained.  Physical Exam  Awake, alert and oriented, sitting  comfortably on her sofa  PHQ2/9: Depression screen [facility] 2/9 02/27/2019 11/24/2018 06/06/2018 06/06/2018 01/10/2018  Decreased Interest 0 0 0 0 0  Down, Depressed, Hopeless 0 0 0 0 0  PHQ - 2 Score 0 0 0 0 0  Altered sleeping 0 - 3 - -  Tired, decreased energy 0 - 0 - -  Change in appetite 0 - 1 - -  Feeling bad or failure about yourself  0 - 0 - -  Trouble concentrating 0 - 0 - -  Moving slowly or fidgety/restless 0 - 0 - -  Suicidal thoughts 0 - 0 - -  PHQ-9 Score 0 - 4 - -  Difficult doing work/chores - - Not difficult at all - -   PHQ-2/9 Result is negative.    Fall Risk: Fall Risk  02/27/2019 11/24/2018 06/06/2018 01/10/2018 10/18/2017  Falls in the past year? 0 0 No Yes No  Number falls in past yr: 0 0 - 1 -  Injury with Fall? 0 - - No -     Assessment & Plan  1. MS (multiple sclerosis) (HCC)  - Dimethyl Fumarate (TECFIDERA) 240 MG CPDR; Take 1 capsule (240 mg total) by mouth 2 (two) times daily.  Dispense: 60 capsule; Refill: 0  2. Other neutropenia ([place])  Recheck next visit   3. Major depression in remission [facility])  Doing well   4. GERD without esophagitis  - omeprazole (PRILOSEC) 40 MG capsule; Take 1 capsule (40 mg total) by mouth daily.  Dispense: 30 capsule; Refill: 3  5. Chronic constipation  - linaclotide (LINZESS) 290 MCG CAPS capsule; Take 1 capsule (290 mcg total) by mouth daily.  Dispense: 30 capsule; Refill: 2  6. Insulin resistance  - Semaglutide,0.25 or 0.5MG /DOS, (OZEMPIC, 0.25 OR 0.5 MG/DOSE,) 2 MG/1.5ML SOPN; Inject 0.5 mg into the skin once a week. Start at 0.25 for 3 weeks after that 0.5 weekly  Dispense: 3 mL; Refill: 0  7. Seasonal allergies  - fluticasone (FLONASE) 50 MCG/ACT nasal spray; Place 2 sprays into both nostrils daily.  Dispense: 16 g; Refill: 0   I discussed the assessment and treatment plan with the patient.  The patient was provided an opportunity to ask questions and all were answered. The patient agreed with the plan and  demonstrated an understanding of the instructions.  The patient was advised to call back or seek an in-person evaluation if the symptoms worsen or if the condition fails to improve as anticipated.  I provided 25  minutes of non-face-to-face time during this encounter.

## 2019-03-05 NOTE — Telephone Encounter (Signed)
Done letter was faxed.

## 2019-03-05 NOTE — Telephone Encounter (Signed)
Patient calling and would like to know if the letter stating that she has MS could be faxed? States that her employer is needing that today. Please advise.  Fax#: [phone] Attn: Delma Freeze

## 2019-03-08 NOTE — Unmapped (Signed)
Patient has enough medication, rescheduling refill call for 4/23.

## 2019-03-21 ENCOUNTER — Other Ambulatory Visit: Payer: Self-pay | Admitting: Family Medicine

## 2019-03-21 DIAGNOSIS — E8881 Metabolic syndrome: Secondary | ICD-10-CM

## 2019-03-27 NOTE — Unmapped (Signed)
The [facility] has made a 2nd and final attempt to reach this patient to refill the following medication:Tecfidera.      We have Left voicemails on the following phone numbers: [phone].    Dates contacted: 04/23, 04/28  Last scheduled delivery: 03/19  The patient may be at risk of non-compliance with this medication. The patient should call the [facility] at [phone] (option 4) to refill medication.    [name]   [facility] Specialty Technician

## 2019-04-03 NOTE — Unmapped (Signed)
[facility] Refill Coordination Note    Specialty Medication(s) to be Shipped:   Neurology: Tecfidera    Other medication(s) to be shipped: none     Hannah Barr, DOB: 08-Jun-1971  Phone: [phone] (home)       All above HIPAA information was verified with patient.     Completed refill call assessment today to schedule patient's medication shipment from the [facility] [phone]).       Specialty medication(s) and dose(s) confirmed: Regimen is correct and unchanged.   Changes to medications: [name] reports no changes reported at this time.  Changes to insurance: No  Questions for the pharmacist: No    Confirmed patient received Welcome Packet with first shipment. The patient will receive a drug information handout for each medication shipped and additional FDA Medication Guides as required.       DISEASE/MEDICATION-SPECIFIC INFORMATION        N/A    SPECIALTY MEDICATION ADHERENCE     Medication Adherence    Patient reported X missed doses in the last month:  0  Specialty Medication:  Tecfidera  Patient is on additional specialty medications:  No                Tecfidera 240 mg: 5 days of medicine on hand         SHIPPING     Shipping address confirmed in Epic.     Delivery Scheduled: Yes, Expected medication delivery date: 04/06/19.     Medication will be delivered via Next Day Courier to the home address in Epic WAM.    Arnold Long   [facility] Specialty Pharmacist

## 2019-04-05 MED FILL — TECFIDERA 240 MG CAPSULE,DELAYED RELEASE: 30 days supply | Qty: 60 | Fill #3 | Status: AC

## 2019-04-05 MED FILL — TECFIDERA 240 MG CAPSULE,DELAYED RELEASE: ORAL | 30 days supply | Qty: 60 | Fill #3

## 2019-04-22 IMAGING — MG MM DIGITAL DIAGNOSTIC BILAT W/ TOMO W/ CAD
8 of 15 series · 8 of 35 positions shown · non-contrast
Comparison: None.

CLINICAL DATA: 46-year-old female with nonfocal pain involving the
outer bilateral breasts for the past 4 months.

EXAM:
2D DIGITAL DIAGNOSTIC BILATERAL MAMMOGRAM WITH CAD AND ADJUNCT TOMO
LEFT BREAST ULTRASOUND

[R CC synth-2D]
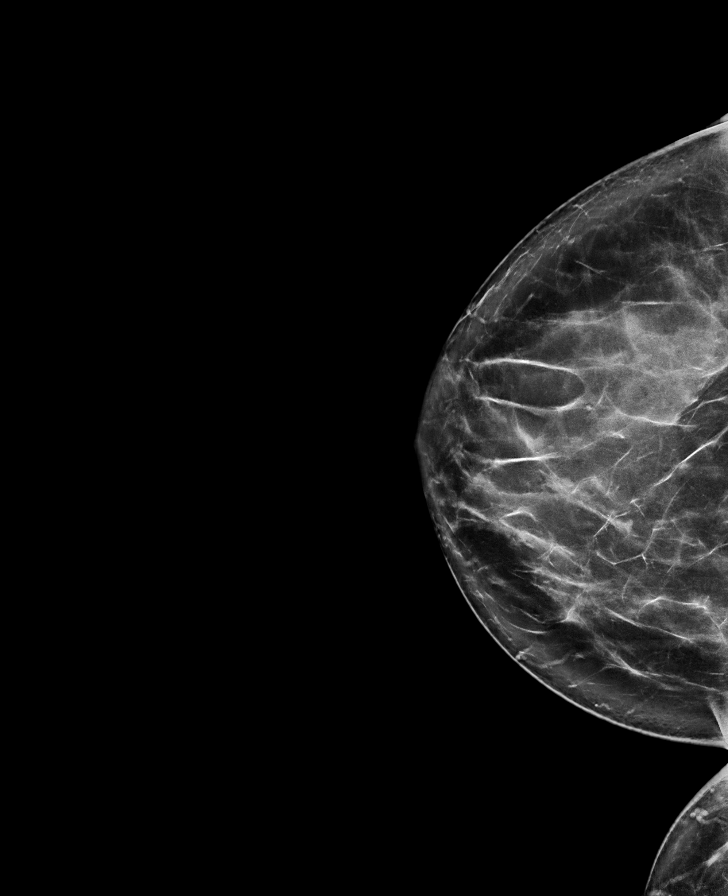

[L MLO (1 of 2)]
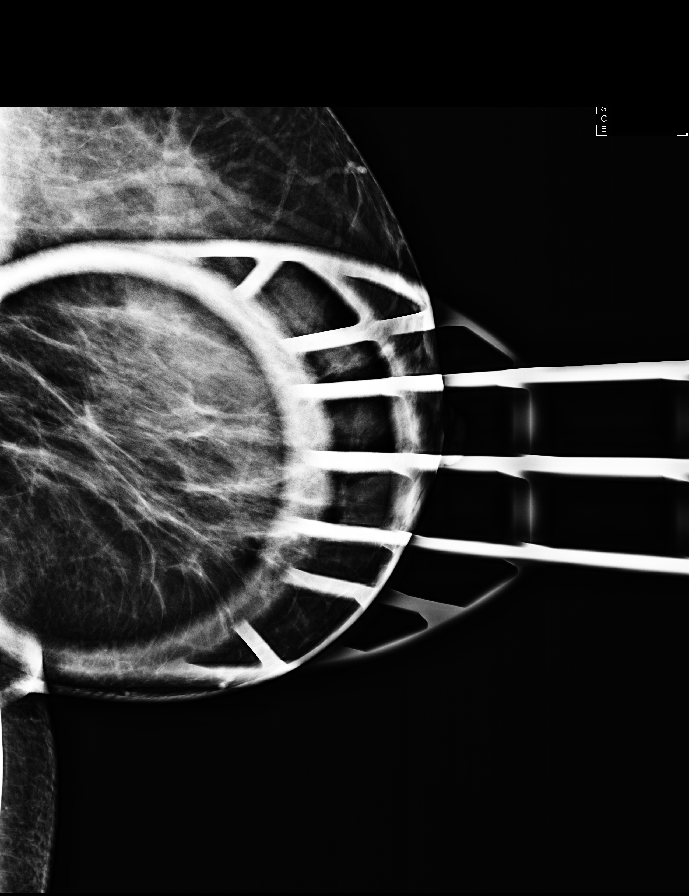

[L CC synth-2D]
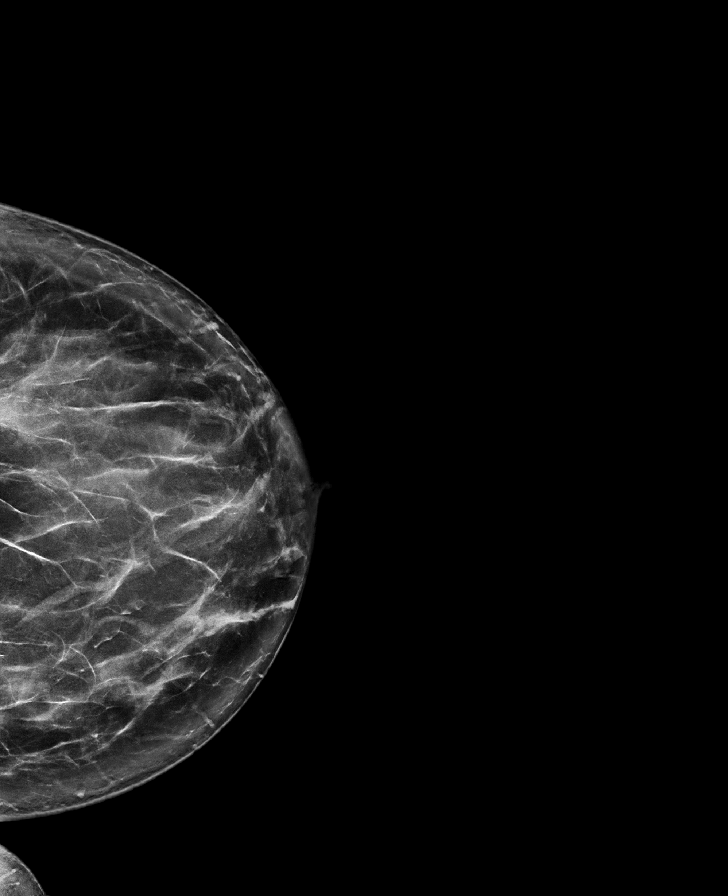

[L MLO (2 of 2)]
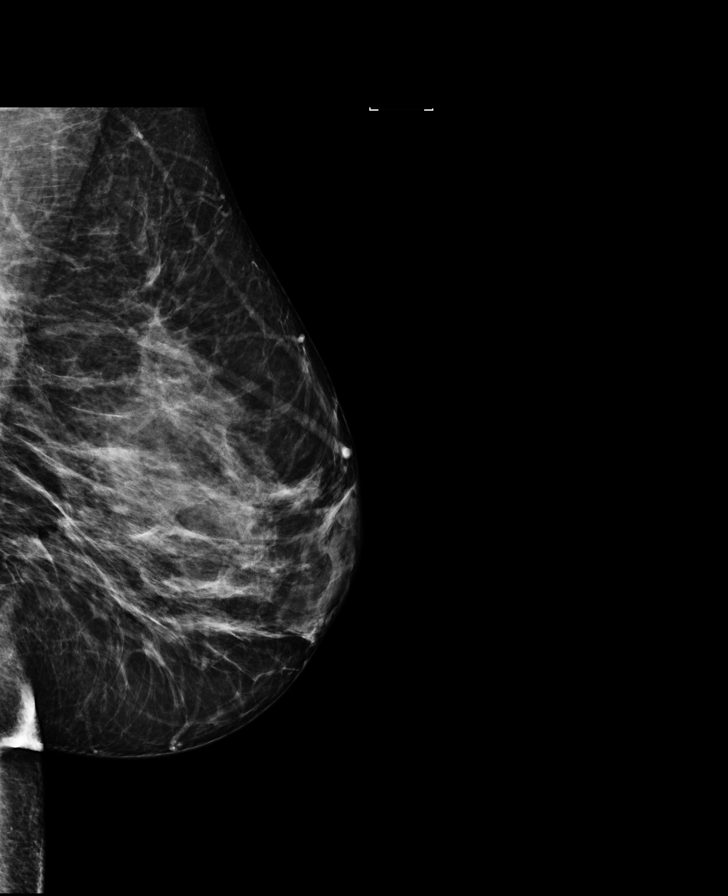

[L CC]
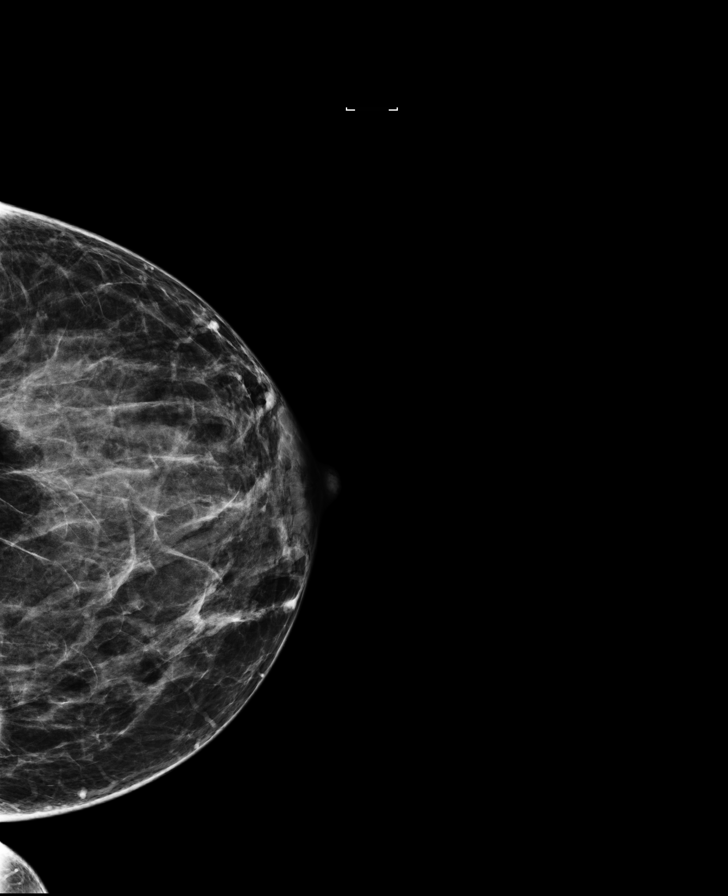

[R MLO]
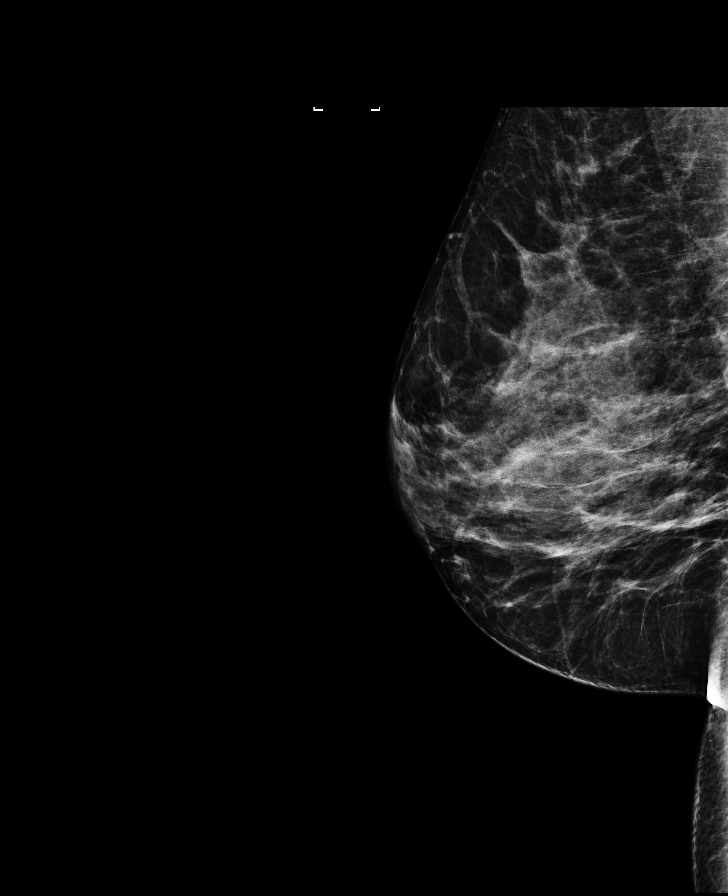

[R CC]
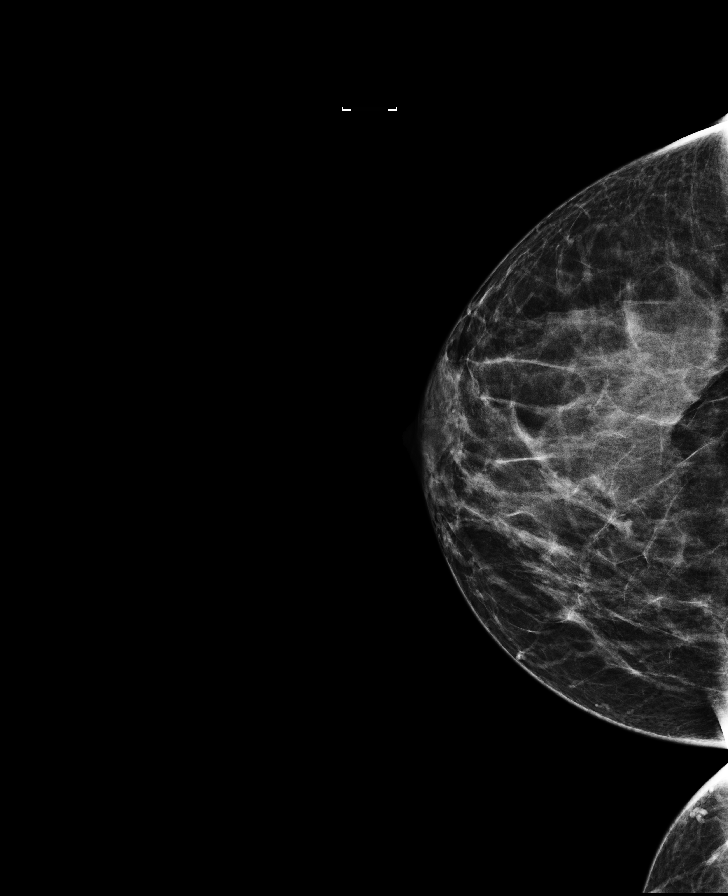

[L MLO synth-2D]
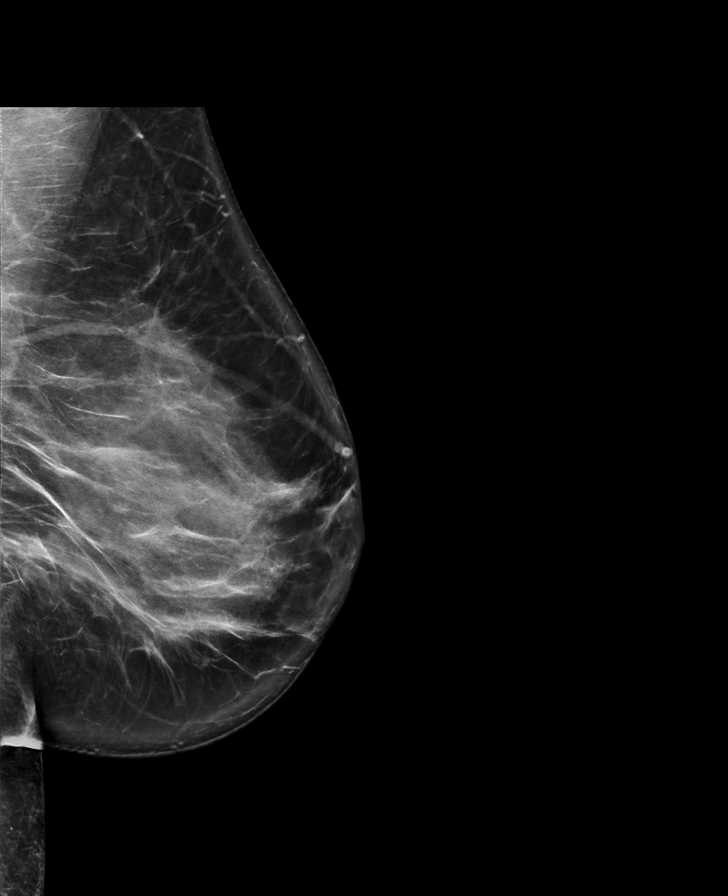

[8 of 35 positions shown; findings below may reference images not displayed]

ACR Breast Density Category c: The breast tissue is heterogeneously
dense, which may obscure small masses.
FINDINGS: No suspicious masses or calcifications are seen in the right breast.
There is an oval mass with obscured margins in the lower outer left
breast best seen on the MLO tomograms measuring approximately
cm..

Mammographic images were processed with CAD.

Physical examination of the outer left breast at the approximate
[DATE] position reveals tenderness with palpation.

Targeted ultrasound of the left breast was performed demonstrating
multiple markedly dilated ducts extending laterally in the left
breast at the 3 o'clock location. At the most peripheral portion of
the dilated ducts is in intraductal mass at 3 o'clock 4 cm from the
nipple measuring 1.3 x 0.6 x 1 cm. This is felt to correspond well
with the mass seen in the left breast at mammography. In addition
there is markedly dilated duct containing mass versus debris at 3
o'clock subareolar measuring 2.5 x 0.6 x 2.1 cm. No lymphadenopathy
seen in the left axilla.
IMPRESSION: 1.  Left breast intraductal mass at 3 o'clock 4 cm from the nipple.

2. Left breast intraductal mass versus debris at 3 o'clock
subareolar.

RECOMMENDATION:
Ultrasound-guided biopsy of the left breast intraductal mass at 3
o'clock 4 cm from nipple and ultrasound-guided biopsy of the left
breast intraductal mass versus debris at 3 o'clock subareolar is
recommended. This will be scheduled for the patient.

I have discussed the findings and recommendations with the patient.
Results were also provided in writing at the conclusion of the
visit. If applicable, a reminder letter will be sent to the patient
regarding the next appointment.

BI-RADS CATEGORY  4: Suspicious.

## 2019-04-27 NOTE — Unmapped (Signed)
Ms. Hannah Barr states she has more than half a bottle left and would like another call in about 2 weeks. Rescheduling as discussed with patient.

## 2019-05-09 MED FILL — TECFIDERA 240 MG CAPSULE,DELAYED RELEASE: ORAL | 30 days supply | Qty: 60 | Fill #4

## 2019-05-09 MED FILL — TECFIDERA 240 MG CAPSULE,DELAYED RELEASE: 30 days supply | Qty: 60 | Fill #4 | Status: AC

## 2019-05-09 NOTE — Unmapped (Signed)
[facility] Clinical Assessment & Refill Coordination Note    Hannah Barr, DOB: August 19, 1971  Phone: [phone] (home)     All above HIPAA information was verified with patient.     Specialty Medication(s):   Neurology: Tecfidera     Current Outpatient Medications   Medication Sig Dispense Refill   ??? cholecalciferol, vitamin D3, (VITAMIN D3) 4,000 unit cap Take 4,000 Units by mouth daily. 30 capsule 11   ??? dimethyl fumarate 240 mg CpDR TAKE 1 CAPSULE BY MOUTH TWICE DAILY WITH MEALS 60 capsule 5   ??? linaclotide (LINZESS) 145 mcg capsule Take 145 mcg by mouth daily as needed (constipation).      ??? mesalamine (CANASA) 1000 MG suppository Insert 1,000 mg into the rectum nightly.     ??? SUMAtriptan (IMITREX) 100 MG tablet Take 100 mg by mouth every two (2) hours as needed.      ??? traZODone (DESYREL) 50 MG tablet TAKE 0.5-1 TABLETS (25-50 MG TOTAL) BY MOUTH AT BEDTIME AS NEEDED FOR SLEEP.  3     No current facility-administered medications for this visit.         Changes to medications: Hannah Barr reports no changes at this time.    Allergies   Allergen Reactions   ??? Penicillins Hives       Changes to allergies: No    SPECIALTY MEDICATION ADHERENCE     Tecfidera 240 mg: 2 days of medicine on hand       Medication Adherence    Patient reported X missed doses in the last month:  0  Specialty Medication:  Tecfidera  Patient is on additional specialty medications:  No          Specialty medication(s) dose(s) confirmed: Regimen is correct and unchanged.     Are there any concerns with adherence? No    Adherence counseling provided? Not needed    CLINICAL MANAGEMENT AND INTERVENTION      Clinical Benefit Assessment:    Do you feel the medicine is effective or helping your condition? Yes    Clinical Benefit counseling provided? Not needed    Adverse Effects Assessment:    Are you experiencing any side effects? No    Are you experiencing difficulty administering your medicine? No    Quality of Life Assessment:    How many days over the past month did your MS  keep you from your normal activities? For example, brushing your teeth or getting up in the morning. 0    Have you discussed this with your provider? Not needed    Therapy Appropriateness:    Is therapy appropriate? Yes, therapy is appropriate and should be continued    DISEASE/MEDICATION-SPECIFIC INFORMATION      N/A    PATIENT SPECIFIC NEEDS     ? Does the patient have any physical, cognitive, or cultural barriers? No    ? Is the patient high risk? No     ? Does the patient require a Care Management Plan? No     ? Does the patient require physician intervention or other additional services (i.e. nutrition, smoking cessation, social work)? No      SHIPPING     Specialty Medication(s) to be Shipped:   Neurology: Tecfidera    Other medication(s) to be shipped: none     Changes to insurance: No    Delivery Scheduled: Yes, Expected medication delivery date: 05/09/19.     Medication will be delivered via Same Day Courier  to the confirmed home address in [place].    The patient will receive a drug information handout for each medication shipped and additional FDA Medication Guides as required.  Verified that patient has previously received a Conservation officer, historic buildings.    All of the patient's questions and concerns have been addressed.    Arnold Long   [facility] Specialty Pharmacist

## 2019-05-22 ENCOUNTER — Other Ambulatory Visit: Payer: Self-pay | Admitting: Family Medicine

## 2019-05-22 DIAGNOSIS — K219 Gastro-esophageal reflux disease without esophagitis: Secondary | ICD-10-CM

## 2019-05-22 NOTE — Unmapped (Signed)
FIRST ATTEMPT to reach out to patient.    Patient is past due for clinic visit.  Staff notified to call and schedule appointment and my chart message and /or letter sent to patient.    Last seen in clinic and last safety labs 11/2018.

## 2019-05-22 NOTE — Unmapped (Signed)
-----   Message from Annye Asa, CNA sent at 05/22/2019 11:02 AM EDT -----  Regarding: RE: schedule  She stated yesterday that she would call us back when she is ready to sch  ----- Message -----  From: [name], PA  Sent: 05/21/2019   4:56 PM EDT  To: Annye Asa, CNA  Subject: RE: schedule                                     Please tell her that she stills needs a video / phone visit with me.  Please schedule this.    Thank you, Disa Riedlinger  ----- Message -----  From: Annye Asa, CNA  Sent: 05/21/2019   3:03 PM EDT  To: [name], PA  Subject: RE: schedule                                     Per pt, she has not had MRIs yet something came up and she has not been able to r/s yet  ----- Message -----  From: [name], PA  Sent: 05/21/2019   9:35 AM EDT  To: Annye Asa, CNA  Subject: schedule                                         Please schedule a follow up video / phone visit for 60 minutes with me.    Thank you, Bowie Doiron

## 2019-05-29 NOTE — Unmapped (Signed)
-----   Message from Annye Asa, CNA sent at 05/29/2019 10:16 AM EDT -----  Regarding: RE: schedule  I called pt again this am, and she stated she has not had the MRIs yet and will call us back when she is ready to schedule  ----- Message -----  From: [name], PA  Sent: 05/21/2019   9:35 AM EDT  To: Annye Asa, CNA  Subject: schedule                                         Please schedule a follow up video / phone visit for 60 minutes with me.    Thank you, Olina Melfi

## 2019-05-30 NOTE — Unmapped (Signed)
[facility] Refill Coordination Note    Specialty Medication(s) to be Shipped:   Neurology: Tecfidera    Other medication(s) to be shipped: n/a     Hannah Barr, DOB: [birth year]  Phone: [phone] (home)       All above HIPAA information was verified with patient.     Completed refill call assessment today to schedule patient's medication shipment from the [facility] [phone]).       Specialty medication(s) and dose(s) confirmed: Regimen is correct and unchanged.   Changes to medications: [name] reports no changes at this time.  Changes to insurance: No  Questions for the pharmacist: No    Confirmed patient received Welcome Packet with first shipment. The patient will receive a drug information handout for each medication shipped and additional FDA Medication Guides as required.       DISEASE/MEDICATION-SPECIFIC INFORMATION        N/A    SPECIALTY MEDICATION ADHERENCE     Medication Adherence    Patient reported X missed doses in the last month:  0  Specialty Medication:  Tecfidera  Patient is on additional specialty medications:  No  Patient is on more than two specialty medications:  No  Any gaps in refill history greater than 2 weeks in the last 3 months:  no  Demonstrates understanding of importance of adherence:  yes  Informant:  patient                Tecfidera 240mg : Patient has 12 days of medication on hand      SHIPPING     Shipping address confirmed in Epic.     Delivery Scheduled: Yes, Expected medication delivery date: 06/08/19.     Medication will be delivered via Next Day Courier to the home address in Epic WAM.    [name]   [facility] Specialty Technician

## 2019-06-08 MED FILL — TECFIDERA 240 MG CAPSULE,DELAYED RELEASE: 30 days supply | Qty: 60 | Fill #5 | Status: AC

## 2019-06-08 MED FILL — TECFIDERA 240 MG CAPSULE,DELAYED RELEASE: ORAL | 30 days supply | Qty: 60 | Fill #5

## 2019-06-16 ENCOUNTER — Other Ambulatory Visit: Payer: Self-pay | Admitting: Family Medicine

## 2019-06-16 DIAGNOSIS — E8881 Metabolic syndrome: Secondary | ICD-10-CM

## 2019-06-18 ENCOUNTER — Ambulatory Visit (INDEPENDENT_AMBULATORY_CARE_PROVIDER_SITE_OTHER): Payer: Medicare HMO | Admitting: Family Medicine

## 2019-06-18 ENCOUNTER — Other Ambulatory Visit: Payer: Self-pay

## 2019-06-18 ENCOUNTER — Encounter: Payer: Self-pay | Admitting: Family Medicine

## 2019-06-18 VITALS — Wt 160.0 lb

## 2019-06-18 DIAGNOSIS — K5909 Other constipation: Secondary | ICD-10-CM | POA: Diagnosis not present

## 2019-06-18 DIAGNOSIS — F325 Major depressive disorder, single episode, in full remission: Secondary | ICD-10-CM

## 2019-06-18 DIAGNOSIS — E8881 Metabolic syndrome: Secondary | ICD-10-CM

## 2019-06-18 DIAGNOSIS — E88819 Insulin resistance, unspecified: Secondary | ICD-10-CM

## 2019-06-18 DIAGNOSIS — R69 Illness, unspecified: Secondary | ICD-10-CM | POA: Diagnosis not present

## 2019-06-18 DIAGNOSIS — D708 Other neutropenia: Secondary | ICD-10-CM

## 2019-06-18 DIAGNOSIS — G4709 Other insomnia: Secondary | ICD-10-CM

## 2019-06-18 DIAGNOSIS — G43009 Migraine without aura, not intractable, without status migrainosus: Secondary | ICD-10-CM

## 2019-06-18 DIAGNOSIS — G35 Multiple sclerosis: Secondary | ICD-10-CM | POA: Diagnosis not present

## 2019-06-18 DIAGNOSIS — G35D Multiple sclerosis, unspecified: Secondary | ICD-10-CM

## 2019-06-18 MED ORDER — OZEMPIC (0.25 OR 0.5 MG/DOSE) 2 MG/1.5ML ~~LOC~~ SOPN: 0.5000 mg | PEN_INJECTOR | SUBCUTANEOUS | 2 refills | Status: DC

## 2019-06-18 MED ORDER — SUMATRIPTAN SUCCINATE 100 MG PO TABS: 100.0000 mg | ORAL_TABLET | ORAL | 1 refills | Status: DC | PRN

## 2019-06-18 MED ORDER — LINACLOTIDE 290 MCG PO CAPS: 290.0000 ug | ORAL_CAPSULE | Freq: Every day | ORAL | 2 refills | Status: DC

## 2019-06-18 MED ORDER — TRAZODONE HCL 50 MG PO TABS: 25.0000 mg | ORAL_TABLET | Freq: Every evening | ORAL | 2 refills | Status: DC | PRN

## 2019-06-18 NOTE — Progress Notes (Signed)
Name: Tammie Lopez   MRN: [id number]    DOB: 1971/04/29   Date:06/18/2019       Progress Note  Subjective  Chief Complaint  Chief Complaint  Patient presents with  . Medication Refill    3 month F/U-needs refill of Imitrex  . Hypertension    Only takes BP is she feels bad  . Multiple Sclerosis  . Insulin Resistance  . Insomnia    Still takes trazodone as needed-averages 5 to 6 hours nightly  . Constipation    Still takes Linzess daily and helping with bowel movements   . MMD  . Obesity    Quit taking Ozempic was not helping her lose weigh    I connected with  ADELLYN CAPEK  on 06/18/19 at  2:20 PM EDT by a video enabled telemedicine application and verified that I am speaking with the correct person using two identifiers.  I discussed the limitations of evaluation and management by telemedicine and the availability of in person appointments. The patient expressed understanding and agreed to proceed. Staff also discussed with the patient that there may be a patient responsible charge related to this service. Patient Location: parked , inside her car Provider Location: [facility]   HPI  [id number] taking medications, prior to last visit because bp was dropping. Not checking bp as often at home, but states feeling well   MS: she is stable, seeingneurologist, Dr. Varney Daily at [facility] , off  Copaxone, provigil, gabapentin , she is currently  on nortriptyline and Tecfidera . She still has intermittent numbness on left side but not as bad and not as frequent. No bowel or bladder incontinence. Still able to work, however taking time off because of COVID-19 . She states when she is hot it gets worse   Insulin resistance: shewas onTrulicity however not losing weight, shetried metformin but did not help either, we tried Victoza but she stopped it because it did not work. Morbid obesity; she states her weight now is  160 lbs, she states she is not drinking sodas and  is cutting down on carbs and has been drinking more water and is also walking 45 minutes on her treadmill 4-5 times a week, we switched to Ozempic but she stopped medication last month, weight is down 8 lbs since last visit based on her scale she is willing on resuming medication   Migraine headaches: usually before her cycles and also one more time per month, takes imitrex prn. Migraine is described as throbbing, behind her right eye. She has associated nausea, no vomtiing, also has photophobia and phonophobia.She needs refills today. Recently having more frequent episodes about twice per week, she thinks secondary to heat. Monitor and if persists we will need to discuss other preventive medication   Insomnia: still has difficulty staying asleep, and sometimes falling asleep, not taking trazodone every night. When she takes it she sleeps well. She takes half to one qhs, she sleeps 4-5 hours sometimes 6 hours. Discussed sleep hygiene . She is usually taking Trazodone when she goes to bed and another half if she wakes up at 2 am when she gets up   Leucopenia: we need to recheck level, however she is afraid to come in for labs at this time .   Chronic constipation: she takes Linzess daily, with medication she is able to have a bowel movement daily , however she can go days without a bowel movement when off medication  . She forgets to  take medication at times  MMD: she used to take medications, phqh 9 is negative, she got very overwhelmed with diagnoses of MS, but is doing well at this time   Patient Active Problem List   Diagnosis Date Noted  . Obesity (BMI 30-39.9) 12/06/2017  . Insulin resistance 09/27/2017  . Essential hypertension 09/21/2017  . Right sided sciatica 09/21/2017  . Major depression, recurrent ([place]) 05/24/2017  . Migraine without aura, not intractable 12/09/2016  . Nickel allergy 12/09/2016  . GERD without esophagitis 12/09/2016  . Other neutropenia ([place]) 12/09/2016  .  Dyslipidemia 12/09/2016  . Diverticulosis 12/09/2016  . 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 Allergic rhinitis, seasonal 12/09/2016  . Chronic constipation 12/09/2016  . Vitamin D deficiency 08/06/2013  . MS (multiple sclerosis) ([place]) 04/17/2013    Past Surgical History:  Procedure Laterality Date  . BREAST BIOPSY Left 01/06/2018   pending path  . BREAST BIOPSY Left 01/06/2018   pending path  . BUNIONECTOMY    . CESAREAN SECTION N/A 2006  . GANGLION CYST EXCISION Bilateral    Hands    Family History  Problem Relation Age of Onset  . Diabetes Mother   . Hypertension Mother   . Cancer Father 75       Mouth  . COPD Father   . Hypertension Brother   . Diabetes Brother     Social History   Socioeconomic History  . Marital status: Single    Spouse name: Not on file  . Number of children: 3  . Years of education: Not on file  . Highest education level: High school graduate  Occupational History    Comment: bus driver  Social Needs  . Financial resource strain: Not hard at all  . Food insecurity    Worry: Never true    Inability: Never true  . Transportation needs    Medical: No    Non-medical: No  Tobacco Use  . Smoking status: Never Smoker  . Smokeless tobacco: Never Used  . Tobacco comment: smoking cessation materials not required  Substance and Sexual Activity  . Alcohol use: No  . Drug use: No  . Sexual activity: Not Currently    Partners: Male  Lifestyle  . Physical activity    Days per week: 5 days    Minutes per session: 40 min  . Stress: To some extent  Relationships  . Social connections    Talks on phone: More than three times a week    Gets together: More than three times a week    Attends religious service: More than 4 times per year    Active member of club or organization: No    Attends meetings of clubs or organizations: Never    Relationship status: Never married  . Intimate partner violence    Fear of current or ex partner: No    Emotionally  abused: No    Physically abused: No    Forced sexual activity: No  Other Topics Concern  . 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  amitriptyline (ELAVIL) 25 MG tablet, Take 1 tablet by mouth as needed for sleep., Disp: , Rfl: 2 .  Dimethyl Fumarate (TECFIDERA) 240 MG CPDR, Take 1 capsule (240 mg total) by mouth 2 (two) times daily., Disp: 60 capsule, Rfl: 0 .  fluticasone (FLONASE) 50  MCG/ACT nasal spray, Place 2 sprays into both nostrils daily., Disp: 16 g, Rfl: 0 .  linaclotide (LINZESS) 290 MCG CAPS capsule, Take 1 capsule (290 mcg total) by mouth daily., Disp: 30 capsule, Rfl: 2 .  Multiple Vitamin (MULTIVITAMIN) tablet, Take 1 tablet by mouth daily., Disp: , Rfl:  .  omeprazole (PRILOSEC) 40 MG capsule, TAKE 1 CAPSULE BY MOUTH EVERY DAY, Disp: 90 capsule, Rfl: 0 .  SUMAtriptan (IMITREX) 100 MG tablet, Take 1 tablet (100 mg total) by mouth every 2 (two) hours as needed for migraine. May repeat in 2 hours if headache persists or recurs., Disp: 9 tablet, Rfl: 1 .  traZODone (DESYREL) 50 MG tablet, Take 0.5-1 tablets (25-50 mg total) by mouth at bedtime as needed for sleep., Disp: 30 tablet, Rfl: 3 .  OZEMPIC, 0.25 OR 0.5 MG/DOSE, 2 MG/1.5ML SOPN, INJECT 0.5 MG INTO THE SKIN ONCE A WEEK. START AT 0.25 FOR 3 WEEKS AFTER THAT 0.5 WEEKLY (Patient not taking: Reported on 06/18/2019), Disp: 3 mL, Rfl: 0  Allergies  Allergen Reactions  . Penicillins Hives    I personally reviewed active problem list, medication list, allergies, family history, social history with the patient/caregiver today.   ROS  Ten systems reviewed and is negative except as mentioned in HPI   Objective  Virtual encounter, vitals not obtained.  There is no height or weight on file to calculate BMI.  Physical Exam  Awake, alert and oriented  PHQ2/9: Depression screen [facility]  2/9 06/18/2019 02/27/2019 11/24/2018 06/06/2018 06/06/2018  Decreased Interest 0 0 0 0 0  Down, Depressed, Hopeless 0 0 0 0 0  PHQ - 2 Score 0 0 0 0 0  Altered sleeping 1 0 - 3 -  Tired, decreased energy 1 0 - 0 -  Change in appetite 0 0 - 1 -  Feeling bad or failure about yourself  0 0 - 0 -  Trouble concentrating 0 0 - 0 -  Moving slowly or fidgety/restless 0 0 - 0 -  Suicidal thoughts 0 0 - 0 -  PHQ-9 Score 2 0 - 4 -  Difficult doing work/chores Not difficult at all - - Not difficult at all -   PHQ-2/9 Result is negative.    Fall Risk: Fall Risk  06/18/2019 02/27/2019 11/24/2018 06/06/2018 01/10/2018  Falls in the past year? 0 0 0 No Yes  Number falls in past yr: 0 0 0 - 1  Injury with Fall? 0 0 - - No     Assessment & Plan  1. MS (multiple sclerosis) ([place])  Continue follow up with   2. Other neutropenia ([place])  Recheck next visit  3. Major depression in remission ([place])  In remission   4. Migraine without aura and without status migrainosus, not intractable  - SUMAtriptan (IMITREX) 100 MG tablet; Take 1 tablet (100 mg total) by mouth every 2 (two) hours as needed for migraine. May repeat in 2 hours if headache persists or recurs.  Dispense: 9 tablet; Refill: 1  5. Insulin resistance  -Semaglutide,0.25 or 0.5MG /DOS, (OZEMPIC, 0.25 OR 0.5 MG/DOSE,) 2 MG/1.5ML SOPN; Inject 0.5 mg into the skin once a week.  Dispense: 3 mL; Refill: 2  6. Chronic constipation  - linaclotide (LINZESS) 290 MCG CAPS capsule; Take 1 capsule (290 mcg total) by mouth daily.  Dispense: 30 capsule; Refill: 2  7. Other insomnia  - traZODone (DESYREL) 50 MG tablet; Take 0.5-1 tablets (25-50 mg total) by mouth at bedtime as needed for sleep.  Dispense:  30 tablet; Refill: 2  I discussed the assessment and treatment plan with the patient. The patient was provided an opportunity to ask questions and all were answered. The patient agreed with the plan and demonstrated an understanding of the instructions.   The patient was advised to call back or seek an in-person evaluation if the symptoms worsen or if the condition fails to improve as anticipated.  I provided 25  minutes of non-face-to-face time during this encounter.

## 2019-06-19 ENCOUNTER — Other Ambulatory Visit: Payer: Self-pay | Admitting: Family Medicine

## 2019-06-19 DIAGNOSIS — E8881 Metabolic syndrome: Secondary | ICD-10-CM

## 2019-06-29 NOTE — Unmapped (Signed)
Last Visit Date: 12/04/2018  Next Visit Date: Visit date not found    No results found for: JCVIRUSAB, HBSAG, HEPBSAB, HBQT, HEPBCAB, HEPCAB, HIV     No results found for this or any previous visit.    No results found for this or any previous visit.    No results found for this or any previous visit.

## 2019-06-29 NOTE — Unmapped (Signed)
[facility] Refill Coordination Note    Specialty Medication(s) to be Shipped:   Neurology: Tecfidera    Other medication(s) to be shipped: n/a     Hannah Barr, DOB: 07/02/1971  Phone: [phone] (home)       All above HIPAA information was verified with patient.     Completed refill call assessment today to schedule patient's medication shipment from the [facility] [phone]).       Specialty medication(s) and dose(s) confirmed: Regimen is correct and unchanged.   Changes to medications: [name] reports no changes at this time.  Changes to insurance: No  Questions for the pharmacist: No    Confirmed patient received Welcome Packet with first shipment. The patient will receive a drug information handout for each medication shipped and additional FDA Medication Guides as required.       DISEASE/MEDICATION-SPECIFIC INFORMATION        N/A    SPECIALTY MEDICATION ADHERENCE     Medication Adherence    Patient reported X missed doses in the last month: 0  Specialty Medication: Tecfidera  Patient is on additional specialty medications: No  Patient is on more than two specialty medications: No  Any gaps in refill history greater than 2 weeks in the last 3 months: no  Demonstrates understanding of importance of adherence: yes  Informant: patient                Tecfidera 240mg : Patient has 10 days of medication on hand       SHIPPING     Shipping address confirmed in Epic.     Delivery Scheduled: Yes, Expected medication delivery date: 07/06/19.     Medication will be delivered via Next Day Courier to the home address in Epic WAM.    [name]   [facility] Specialty Technician

## 2019-07-02 MED ORDER — DIMETHYL FUMARATE 240 MG CAPSULE,DELAYED RELEASE
ORAL_CAPSULE | ORAL | 0 refills | 0 days | Status: CP
Start: 2019-07-02 — End: 2019-07-26
  Filled 2019-07-05: qty 60, 30d supply, fill #0

## 2019-07-04 DIAGNOSIS — G35 Multiple sclerosis: Secondary | ICD-10-CM | POA: Diagnosis not present

## 2019-07-04 DIAGNOSIS — E559 Vitamin D deficiency, unspecified: Secondary | ICD-10-CM | POA: Diagnosis not present

## 2019-07-04 DIAGNOSIS — D72818 Other decreased white blood cell count: Secondary | ICD-10-CM

## 2019-07-04 NOTE — Unmapped (Addendum)
Obtain your labs within the next 2 weeks at:  [facility] outpatient lab in [facility].  [address]  [place], [place] [address]  [phone]    Obtain the flu vaccine when available.  Call and schedule your MRI's.  Follow up video in 6 months.         ??   In case of:  ?? a suspected relapse (new symptoms or worsening existing symptoms, lasting for >24h)  OR  ?? a need for an additional appointment for other reasons     Please contact:    [facility] Neurology Call Center  Phone: [phone]   Fax: [phone]       [facility], Department of Neurology /  Multiple Sclerosis Division    [address]    [place], [place] [address]

## 2019-07-04 NOTE — Unmapped (Signed)
[facility] at [facility]  Multiple Sclerosis / Neuroimmunology Division  Lynzee Lindquist [name] [facility]  Physician Assistant    Phone: [phone]  Fax: [phone]      Patient Name: Hannah Barr   Date of Birth: September 13, 1971  Medical Record Number: [id number]  [address]  [place] [address]     Direct entry by:  [name], PA-C.  Supervising Physician: Dr. [name].    DATE OF VISIT: July 04, 2019    REASON FOR PATIENT OUTREACH /  video CALL ENCOUNTER: Follow up for evaluation of Multiple Sclerosis.  Last seen in clinic by [facility] 12/04/2018.    Patient location: Vehicle. Not driving, parked.  Start time 1400 , End time 1431 , Total visit time (includes pre and post visit activities) 41 minutes.    I spent 31 minutes on the real-time audio and video with the patient. I spent an additional 10 minutes on pre- and post-visit activities.     The patient was physically located in [place] or a state in which I am permitted to provide care. The patient and/or parent/guardian understood that s/he may incur co-pays and cost sharing, and agreed to the telemedicine visit. The visit was reasonable and appropriate under the circumstances given the patient's presentation at the time.    The patient and/or parent/guardian has been advised of the potential risks and limitations of this mode of treatment (including, but not limited to, the absence of in-person examination) and has agreed to be treated using telemedicine. The patient's/patient's family's questions regarding telemedicine have been answered.     If the visit was completed in an ambulatory setting, the patient and/or parent/guardian has also been advised to contact their provider???s office for worsening conditions, and seek emergency medical treatment and/or call 911 if the patient deems either necessary.     Evaluated by [name] 06/15/2018. ASSESSMENT AND PLAN:   **  Relapsing Remitting Multiple Sclerosis.  -Started Tecfidera 05/2018. Will refill once labs are received and reviewed.  -Check CBC/diff, LFT and Vitamin D 25-OH within the next two weeks at [facility] outpatient clinic in [facility].  -Schedule  MRI of the  Brain, Cervical  and Thoracic spine with / without contrast.  Past due from 04/2019. Patient confirms hat she has the number to call and schedule. Did not complete re-baseline MRI's.    **  Vitamin D deficiency: Continue Vit D3 4,000 units daily.  **Advise to obtain flu vaccine when available.  **Follow up in 6 months or sooner if needed, video.    Denies travel within the past 14 days, respiratory symptoms, fever/ chills, cough, shortness of breath , GI symptoms, loss of smell  / taste or muscle aches.  Has not been in contact with anyone who has tested positive for COVID19 or tested positive themselves.  Advised to follow CDC and [employer] guidelines.  In the event of respiratory symptoms, fever, cough, shortness of breath , GI symptoms, loss of smell  / taste or muscle aches advised to call PCP.     NTERVAL HISTORY / CHIEF COMPLAINT since the last visit on   12/04/2018.  Patient reports no new neurological symptoms since the last visit: and no MS relapses.     Care Everywhere. 11/24/2018 ALC = 750, WBC = 2,500, AST/ALT= 20/17.    PRIOR HISTORY: A 48 y.o.  African-American left handed female who was diagnosed with relapsing remitting multiple sclerosis  02/11/2011.  History of Anemia, Essential hypertension,GERD, Migraine.   She was seen as a new patient on 02/10/2011 by Dr. Denice Bors.   Initial symptoms began in 12/2010. This started abruptly with occipital headaches with occasional tension pain in her neck. Two days later, she started having discrimination to light touch along the posterior aspect of her left forearm that radiated into her left shoulder. Over the next several days, her symptoms worsened. She began having numbness in her left arm. She then went to the emergency room at [facility], had MRI of the brain and C-spine, which showed demyelinating abnormalities.     SCREENING LABS:   02/10/2011 : ACE 21, ANA positive, 1:80, RF <6.3, Lyme Serology neg, B12 =  339.  04/11/2018 RPR nonreative, NMO APQ4 IgG and MOG IgG1 neg.    04/11/2018 Baseline:  T25FW= 5.19 seconds.  MMSE = 30.  PHQ9 = 12.      09/20/2017 PHQ9 = 7.    MRI REVIEW:  05/10/2018  MRI of the brain with / without contrast. compared to 08/14/2016: ??There is a new 1.8 x 1.3 cm T2/FLAIR hyperintense lesion in the anterior left temporal lobe with peripheral rim enhancement. Other scattered white matter foci of abnormal increased T2/FLAIR signal are similar appearance of the prior exam.  The optic nerves are normal in appearance.    05/10/2018 MRI of the cervical spine with / without contrast.  compared to 08/14/2016: Increased T2 signal in the left cervical medullary junction and right lateral cord at C2. No other cervical cord signal abnormality detected.    05/17/2018 MRI of the thoracic spine with / without contrast. compared to 08/14/2016: Increased T2 signal within the central thoracic cord at the level of T3, left lateral and posterior central cord at the level of T5-6.  ??No abnormal enhancement is seen  03/29/2015 MRI of the thoracic spine showed a new enhancing lesion.    MS FLARE-UPS: Never received IVMP.    MS MEDICATION HISTORY:   She was started on Copaxone approximately 02/11/2011 - 05/2018.  Tecfidera 05/2018 due to increase in disease activity seen on MRI.     GYN HISTORY:  LNM 07/2016.    REVIEW OF SYSTEMS:   10 systems reviewed and otherwise negative except as noted in HPI.    No visits with results within 1 Month(s) from this visit.   Latest known visit with results is:   Office Visit on 12/04/2018   Component Date Value Ref Range Status   ??? Creatinine 12/04/2018 0.59* 0.60 - 1.00 mg/dL Final   ??? EGFR CKD-EPI Non-African American,* 12/04/2018 >90  >=60 mL/min/1.68m2 Final   ??? EGFR CKD-EPI African American, Fem* 12/04/2018 >90  >=60 mL/min/1.1m2 Final   ??? Vitamin D Total (25OH) 12/04/2018 23.7  20.0 - 80.0 ng/mL Final         Patient Active Problem List   Diagnosis   ??? Hypertension   ??? Multiple sclerosis (CMS-HCC)   ??? MS (multiple sclerosis) (CMS-HCC)   ??? Vitamin D deficiency     Current Outpatient Medications   Medication Sig Dispense Refill   ??? cholecalciferol, vitamin D3, (VITAMIN D3) 4,000 unit cap Take 4,000 Units by mouth daily. 30 capsule 11   ??? dimethyl fumarate 240 mg CpDR Take 1 capsule (240mg ) by mouth twice daily with meals. 60 capsule 0   ??? linaclotide (LINZESS) 145 mcg capsule Take 145 mcg by mouth daily as needed (constipation).      ??? mesalamine (CANASA) 1000 MG suppository Insert  1,000 mg into the rectum nightly.     ??? SUMAtriptan (IMITREX) 100 MG tablet Take 100 mg by mouth every two (2) hours as needed.      ??? traZODone (DESYREL) 50 MG tablet TAKE 0.5-1 TABLETS (25-50 MG TOTAL) BY MOUTH AT BEDTIME AS NEEDED FOR SLEEP.  3     No current facility-administered medications for this visit.          Allergies   Allergen Reactions   ??? Penicillins Hives     Social History     Socioeconomic History   ??? Marital status: Single     Spouse name: Not on file   ??? Number of children: Not on file   ??? Years of education: Not on file   ??? Highest education level: Not on file   Occupational History   ??? Not on file   Social Needs   ??? Financial resource strain: Not on file   ??? Food insecurity     Worry: Not on file     Inability: Not on file   ??? Transportation needs     Medical: Not on file     Non-medical: Not on file   Tobacco Use   ??? Smoking status: Never Smoker   ??? Smokeless tobacco: Never Used   Substance and Sexual Activity   ??? Alcohol use: No   ??? Drug use: No   ??? Sexual activity: Not on file   Lifestyle   ??? Physical activity     Days per week: Not on file     Minutes per session: Not on file   ??? Stress: Not on file   Relationships   ??? Social Psychologist, counselling on phone: Not on file     Gets together: Not on file     Attends religious service: Not on file     Active member of club or organization: Not on file     Attends meetings of clubs or organizations: Not on file     Relationship status: Not on file   Other Topics Concern   ??? Not on file   Social History Narrative    She lives by herself and 2 children, ages 34     and 60. She has not worked since 01/2011. She used to work as a     Interior and spatial designer. Substance abuse, denies tobacco, alcohol or illicit     drug use.                  VIDEO EXAM:  General :   In no acute distress. Normal skin color.     Alert and oriented to person, place, time and situation.   Recent and remote memory intact.    Attention span and concentration normal.    Language and spontaneous speech normal, no dysarthria or aphasia.  Fund of knowledge normal.     Neurological Examination:     Cranial Nerves:   Il, lll- Reports no changes in vision.  II, IV, VI- extra ocular movements are intact, No ptosis, no nystagmus.  V- sensation of the face intact b/l.Patient performed on self.  VII- face symmetrical, no facial droop, normal facial movements with smile/grimace  VIII- Hearing grossly intact.  XI- Full shoulder shrug bilaterally  XII- Tongue protrudes midline, full range of movements of the tongue    Neck flexion normal.    Motor Exam:   Able to stand up from the car    Able to march in place.  Sensory UEs LEs    R L R L   Light touch performed by patient WNL WNL WNL WNL     Cerebellar/Coordination:  Rapid alternating movements, finger-to-nose and heel-to-shin  bilaterally demonstrates no abnormalities.  No limb, trunk or gait ataxia seen..  No tremors noted.    Gait: normal stride, normal base, normal armswing. Able to tandem, heel, and toe gait without difficulty. Negative Romberg.

## 2019-07-05 MED FILL — TECFIDERA 240 MG CAPSULE,DELAYED RELEASE: 30 days supply | Qty: 60 | Fill #0 | Status: AC

## 2019-07-09 ENCOUNTER — Ambulatory Visit: Admit: 2019-07-09 | Discharge: 2019-07-10 | Payer: MEDICARE

## 2019-07-09 DIAGNOSIS — G35 Multiple sclerosis: Secondary | ICD-10-CM | POA: Diagnosis not present

## 2019-07-09 LAB — HEPATIC FUNCTION PANEL
ALT (SGPT): 12 U/L (ref <35)
BILIRUBIN DIRECT: <0.1 mg/dL (ref 0.00–0.40)
PROTEIN TOTAL: 7.5 g/dL (ref 6.5–8.3)

## 2019-07-09 LAB — CBC W/ AUTO DIFF
BASOPHILS ABSOLUTE COUNT: 0 10*9/L (ref 0.0–0.1)
BASOPHILS RELATIVE PERCENT: 0.3 %
EOSINOPHILS ABSOLUTE COUNT: 0 10*9/L (ref 0.0–0.4)
EOSINOPHILS RELATIVE PERCENT: 0.8 %
HEMATOCRIT: 38.2 % (ref 36.0–46.0)
LARGE UNSTAINED CELLS: 2 % (ref 0–4)
LYMPHOCYTES ABSOLUTE COUNT: 0.7 10*9/L — ABNORMAL LOW (ref 1.5–5.0)
LYMPHOCYTES RELATIVE PERCENT: 22.3 %
MEAN CORPUSCULAR HEMOGLOBIN CONC: 32.3 g/dL (ref 31.0–37.0)
MEAN CORPUSCULAR HEMOGLOBIN: 27.7 pg (ref 26.0–34.0)
MEAN CORPUSCULAR VOLUME: 85.7 fL (ref 80.0–100.0)
MEAN PLATELET VOLUME: 9.4 fL (ref 7.0–10.0)
MONOCYTES ABSOLUTE COUNT: 0.2 10*9/L (ref 0.2–0.8)
MONOCYTES RELATIVE PERCENT: 5.8 %
NEUTROPHILS ABSOLUTE COUNT: 2 10*9/L (ref 2.0–7.5)
NEUTROPHILS RELATIVE PERCENT: 68.9 %
PLATELET COUNT: 264 10*9/L (ref 150–440)
RED BLOOD CELL COUNT: 4.46 10*12/L (ref 4.00–5.20)
RED CELL DISTRIBUTION WIDTH: 13.6 % (ref 12.0–15.0)
WBC ADJUSTED: 2.9 10*9/L — ABNORMAL LOW (ref 4.5–11.0)

## 2019-07-09 LAB — CREATININE
CREATININE: 0.55 mg/dL — ABNORMAL LOW (ref 0.60–1.00)
Creatinine:MCnc:Pt:Ser/Plas:Qn:: 0.55 — ABNORMAL LOW
EGFR CKD-EPI AA FEMALE: >90 mL/min/{1.73_m2} (ref >=60)

## 2019-07-09 LAB — PLATELET COUNT: Platelets:NCnc:Pt:Bld:Qn:Automated count: 264

## 2019-07-09 LAB — ALBUMIN: Albumin:MCnc:Pt:Ser/Plas:Qn:: 4.3

## 2019-07-10 LAB — VITAMIN D, TOTAL (25OH): Lab: 23.3

## 2019-07-12 ENCOUNTER — Telehealth: Payer: Self-pay | Admitting: Family Medicine

## 2019-07-12 ENCOUNTER — Other Ambulatory Visit: Payer: Self-pay | Admitting: Family Medicine

## 2019-07-12 NOTE — Telephone Encounter (Signed)
Relation to pt: self  Call back number: [phone]     Reason for call:  Patient requesting orders for ultrasound of left breast and mammogram please send to [facility], please advise patient when orders are placed

## 2019-07-13 ENCOUNTER — Other Ambulatory Visit: Payer: Self-pay | Admitting: Family Medicine

## 2019-07-13 DIAGNOSIS — R928 Other abnormal and inconclusive findings on diagnostic imaging of breast: Secondary | ICD-10-CM

## 2019-07-13 NOTE — Telephone Encounter (Signed)
Yes, she had it done in February 2019. Please place order. I don't know what to order.

## 2019-07-14 NOTE — Unmapped (Signed)
Addended by: Lucretia Field D on: 07/14/2019 02:24 PM     Modules accepted: Orders

## 2019-07-15 NOTE — Unmapped (Signed)
-----   Message from Sarita Bottom, MD sent at 07/14/2019  2:22 PM EDT -----  [name], please inform the patient to go to any [facility] place  in the first week of September to repeat CBC, white count was low.   Thanks,  Dr D.

## 2019-07-16 NOTE — Unmapped (Signed)
-----   Message from Sarita Bottom, MD sent at 07/14/2019  2:22 PM EDT -----  [name], please inform the patient to go to any [facility] place  in the first week of September to repeat CBC, white count was low.   Thanks,  Dr D.

## 2019-07-16 NOTE — Unmapped (Signed)
Per provider request pt has been advised of low white blood cell count. Pt has been told that provider is asking that she go to the lab the first week of September to have a cbc w/diff redrawn. She verbalized understanding and agreed.AN

## 2019-07-23 ENCOUNTER — Encounter: Payer: Self-pay | Admitting: Family Medicine

## 2019-07-23 ENCOUNTER — Ambulatory Visit (INDEPENDENT_AMBULATORY_CARE_PROVIDER_SITE_OTHER): Payer: Medicare HMO | Admitting: Family Medicine

## 2019-07-23 ENCOUNTER — Other Ambulatory Visit: Payer: Self-pay

## 2019-07-23 VITALS — BP 122/84 | HR 82 | Temp 96.9°F | Resp 14 | Ht 62.0 in | Wt 162.0 lb

## 2019-07-23 DIAGNOSIS — Z1211 Encounter for screening for malignant neoplasm of colon: Secondary | ICD-10-CM

## 2019-07-23 DIAGNOSIS — Z30018 Encounter for initial prescription of other contraceptives: Secondary | ICD-10-CM

## 2019-07-23 DIAGNOSIS — Z23 Encounter for immunization: Secondary | ICD-10-CM | POA: Diagnosis not present

## 2019-07-23 DIAGNOSIS — Z0000 Encounter for general adult medical examination without abnormal findings: Secondary | ICD-10-CM | POA: Diagnosis not present

## 2019-07-23 MED ORDER — NORGESTIMATE-ETH ESTRADIOL 0.25-35 MG-MCG PO TABS: 1.0000 | ORAL_TABLET | Freq: Every day | ORAL | 11 refills | Status: DC

## 2019-07-23 NOTE — Progress Notes (Signed)
Name: Tammie Lopez   MRN: [id number]    DOB: [birth year]   Date:07/23/2019       Progress Note  Subjective  Chief Complaint  Chief Complaint  Patient presents with  . Annual Exam    no pap normal 2018  . Medication Refill  . Back Pain    wants refill on diclofenac sodium 1% and meloxicam  . Contraception    for heavy periods    HPI   Patient presents for annual CPE   Diet: not eating vegetables, but she will try to add to her diet , she eats salads at times. She is increasing water intake  Exercise: needs to increase physical activity to 150 minutes per day, only walking occasionally.   USPSTF grade A and B recommendations    Office Visit from 07/23/2019 in [facility]  AUDIT-C Score  0     Hypertension: BP Readings from Last 3 Encounters:  07/23/19 122/84  12/14/18 (!) 150/94  11/24/18 120/80   Obesity: Wt Readings from Last 3 Encounters:  07/23/19 162 lb (73.5 kg)  06/18/19 160 lb (72.6 kg)  12/14/18 160 lb (72.6 kg)   BMI Readings from Last 3 Encounters:  07/23/19 29.63 kg/m  06/18/19 29.26 kg/m  12/14/18 29.26 kg/m    Hep C Screening: up to date  STD testing and prevention (HIV/chl/gon/syphilis): not interested  Intimate partner violence: negative screen  Sexual History/Pain during Intercourse: no pain during intercourse, same partner over 5 years, not using condoms and does not want to get pregnant, bp is at goal and we will try ocp.  Menstrual History/LMP/Abnormal Bleeding: LMP 07/14/2019, explained that she can start rx ocp Incontinence Symptoms: no problems   Advanced Care Planning: A voluntary discussion about advance care planning including the explanation and discussion of advance directives.  Discussed health care proxy and Living will, and the patient was able to identify a health care proxy as Tammie Lopez    Patient does not have a living will at present time.   Breast cancer: scheduled  BRCA gene screening: N/A Cervical cancer  screening: repeat in 201  Osteoporosis Screening: N/A  Lipids:  Lab Results  Component Value Date   CHOL 195 06/06/2018   CHOL 182 05/24/2017   Lab Results  Component Value Date   HDL 57 06/06/2018   HDL 61 05/24/2017   Lab Results  Component Value Date   LDLCALC 121 (H) 06/06/2018   LDLCALC 110 (H) 05/24/2017   Lab Results  Component Value Date   TRIG 75 06/06/2018   TRIG 57 05/24/2017   Lab Results  Component Value Date   CHOLHDL 3.4 06/06/2018   CHOLHDL 3.0 05/24/2017   No results found for: LDLDIRECT  Glucose:  Glucose, Bld  Date Value Ref Range Status  06/06/2018 90 65 - 99 mg/dL Final    Comment:    .            Fasting reference interval .   09/21/2017 88 65 - 139 mg/dL Final    Comment:    .        Non-fasting reference interval .   08/09/2017 102 (H) 65 - 99 mg/dL Final    Comment:    .            Fasting reference interval . For someone without known diabetes, a glucose value between 100 and 125 mg/dL is consistent with prediabetes and should be confirmed with a follow-up test. .  Skin cancer: discussed atypical lesions  Colorectal cancer: discussed new guidelines  Lung cancer:   Low Dose CT Chest recommended if Age 13-80 years, 30 pack-year currently smoking OR have quit w/in 15years. Patient does not qualify.   [id number]   Patient Active Problem List   Diagnosis Date Noted  . Obesity (BMI 30-39.9) 12/06/2017  . Insulin resistance 09/27/2017  . Essential hypertension 09/21/2017  . Right sided sciatica 09/21/2017  . Major depression, recurrent ([place]) 05/24/2017  . Migraine without aura, not intractable 12/09/2016  . Nickel allergy 12/09/2016  . GERD without esophagitis 12/09/2016  . Other neutropenia ([place]) 12/09/2016  . Dyslipidemia 12/09/2016  . Diverticulosis 12/09/2016  . 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 Allergic rhinitis, seasonal 12/09/2016  . Chronic constipation 12/09/2016  . Vitamin D deficiency 08/06/2013  . MS (multiple  sclerosis) ([place]) 04/17/2013    Past Surgical History:  Procedure Laterality Date  . BREAST BIOPSY Left 01/06/2018   pending path  . BREAST BIOPSY Left 01/06/2018   pending path  . BUNIONECTOMY    . CESAREAN SECTION N/A 2006  . GANGLION CYST EXCISION Bilateral    Hands    Family History  Problem Relation Age of Onset  . Diabetes Mother   . Hypertension Mother   . Cancer Father 75       Mouth  . COPD Father   . Hypertension Brother   . Diabetes Brother     Social History   Socioeconomic History  . Marital status: Single    Spouse name: Not on file  . Number of children: 3  . Years of education: Not on file  . Highest education level: High school graduate  Occupational History    Comment: bus driver  Social Needs  . Financial resource strain: Not hard at all  . Food insecurity    Worry: Never true    Inability: Never true  . Transportation needs    Medical: No    Non-medical: No  Tobacco Use  . Smoking status: Never Smoker  . Smokeless tobacco: Never Used  . Tobacco comment: smoking cessation materials not required  Substance and Sexual Activity  . Alcohol use: No  . Drug use: No  . Sexual activity: Not Currently    Partners: Male  Lifestyle  . Physical activity    Days per week: 5 days    Minutes per session: 40 min  . Stress: To some extent  Relationships  . Social connections    Talks on phone: More than three times a week    Gets together: More than three times a week    Attends religious service: More than 4 times per year    Active member of club or organization: No    Attends meetings of clubs or organizations: Never    Relationship status: Never married  . Intimate partner violence    Fear of current or ex partner: No    Emotionally abused: No    Physically abused: No    Forced sexual activity: No  Other Topics Concern  . 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  amitriptyline (ELAVIL) 25 MG tablet, Take 1 tablet by mouth as needed for sleep., Disp: , Rfl: 2 .  Dimethyl Fumarate (TECFIDERA) 240 MG CPDR, Take 1  capsule (240 mg total) by mouth 2 (two) times daily., Disp: 60 capsule, Rfl: 0 .  fluticasone (FLONASE) 50 MCG/ACT nasal spray, Place 2 sprays into both nostrils daily., Disp: 16 g, Rfl: 0 .  linaclotide (LINZESS) 290 MCG CAPS capsule, Take 1 capsule (290 mcg total) by mouth daily., Disp: 30 capsule, Rfl: 2 .  Multiple Vitamin (MULTIVITAMIN) tablet, Take 1 tablet by mouth daily., Disp: , Rfl:  .  omeprazole (PRILOSEC) 40 MG capsule, TAKE 1 CAPSULE BY MOUTH EVERY DAY, Disp: 90 capsule, Rfl: 0 .  Semaglutide,0.25 or 0.5MG/DOS, (OZEMPIC, 0.25 OR 0.5 MG/DOSE,) 2 MG/1.5ML SOPN, Inject 0.5 mg into the skin once a week., Disp: 3 mL, Rfl: 2 .  SUMAtriptan (IMITREX) 100 MG tablet, Take 1 tablet (100 mg total) by mouth every 2 (two) hours as needed for migraine. May repeat in 2 hours if headache persists or recurs., Disp: 9 tablet, Rfl: 1 .  traZODone (DESYREL) 50 MG tablet, Take 0.5-1 tablets (25-50 mg total) by mouth at bedtime as needed for sleep., Disp: 30 tablet, Rfl: 2  Allergies  Allergen Reactions  . Penicillins Hives     ROS  Constitutional: Negative for fever or significant   weight change.  Respiratory: Negative for cough and shortness of breath.   Cardiovascular: Negative for chest pain or palpitations.  Gastrointestinal: Negative for abdominal pain, no bowel changes.  Musculoskeletal: Negative for gait problem or joint swelling.  Skin: Negative for rash.  Neurological: Negative for dizziness or headache. she has numbness on her left foot, intermittent from MS No other specific complaints in a complete review of systems (except as listed in HPI above).  Objective  Vitals:   07/23/19 1400  BP: 122/84  Pulse: 82  Resp: 14   Temp: (!) 96.9 F (36.1 C)  SpO2: 99%  Weight: 162 lb (73.5 kg)  Height: '5\' 2"'  (1.575 m)    Body mass index is 29.63 kg/m.  Physical Exam  Constitutional: Patient appears well-developed and well-nourished. No distress.  HENT: Head: Normocephalic and atraumatic. Ears: B TMs ok, no erythema or effusion; Nose: Nose normal. Mouth/Throat: Oropharynx is clear and moist. No oropharyngeal exudate.  Eyes: Conjunctivae and EOM are normal. Pupils are equal, round, and reactive to light. No scleral icterus.  Neck: Normal range of motion. Neck supple. No JVD present. No thyromegaly present.  Cardiovascular: Normal rate, regular rhythm and normal heart sounds.  No murmur heard. No BLE edema. Pulmonary/Chest: Effort normal and breath sounds normal. No respiratory distress. Abdominal: Soft. Bowel sounds are normal, no distension. There is no tenderness. no masses Breast: no lumps or masses, no nipple discharge or rashes FEMALE GENITALIA:  Deferred  RECTAL: not done  Musculoskeletal: Normal range of motion, no joint effusions. No gross deformities Neurological: he is alert and oriented to person, place, and time. No cranial nerve deficit. Coordination, balance, strength, speech and gait are normal.  Skin: Skin is warm and dry. No rash noted. No erythema.  Psychiatric: Patient has a normal mood and affect. behavior is normal. Judgment and thought content normal.   PHQ2/9: Depression screen [facility] 2/9 07/23/2019 06/18/2019 02/27/2019 11/24/2018 06/06/2018  Decreased Interest 0 0 0 0 0  Down, Depressed, Hopeless 0 0 0 0 0  PHQ - 2 Score 0 0 0 0 0  Altered sleeping 0 1 0 - 3  Tired, decreased energy 0 1 0 - 0  Change in appetite 0 0 0 - 1  Feeling bad or failure about yourself  0 0 0 -  0  Trouble concentrating 0 0 0 - 0  Moving slowly or fidgety/restless 0 0 0 - 0  Suicidal thoughts 0 0 0 - 0  PHQ-9 Score 0 2 0 - 4  Difficult doing work/chores Not difficult at all Not difficult at all - - Not  difficult at all     Fall Risk: Fall Risk  07/23/2019 06/18/2019 02/27/2019 11/24/2018 06/06/2018  Falls in the past year? 0 0 0 0 No  Number falls in past yr: 0 0 0 0 -  Injury with Fall? 0 0 0 - -     Functional Status Survey: Is the patient deaf or have difficulty hearing?: No Does the patient have difficulty seeing, even when wearing glasses/contacts?: Yes Does the patient have difficulty concentrating, remembering, or making decisions?: No Does the patient have difficulty walking or climbing stairs?: No Does the patient have difficulty dressing or bathing?: No Does the patient have difficulty doing errands alone such as visiting a doctor's office or shopping?: No   Assessment & Plan   1. Well adult exam   2. Needs flu shot  Refused  3. Colon cancer screening  - Ambulatory referral to Gastroenterology  4. Encounter for initial prescription of other contraceptives  - norgestimate-ethinyl estradiol ([place] 28) 0.25-35 MG-MCG tablet; Take 1 tablet by mouth daily.  Dispense: 1 Package; Refill: 11  Discussed it may cause increase in LDL, clot formation, and we will need to monitor her bp, also discussed STI prevention , start the day of her next cycle. It has been heavy    -USPSTF grade A and B recommendations reviewed with patient; age-appropriate recommendations, preventive care, screening tests, etc discussed and encouraged; healthy living encouraged; see AVS for patient education given to patient -Discussed importance of 150 minutes of physical activity weekly, eat two servings of fish weekly, eat one serving of tree nuts ( cashews, pistachios, pecans, almonds.Marland Kitchen) every other day, eat 6 servings of fruit/vegetables daily and drink plenty of water and avoid sweet beverages.

## 2019-07-23 NOTE — Patient Instructions (Signed)

## 2019-07-24 ENCOUNTER — Encounter: Payer: Self-pay | Admitting: Family Medicine

## 2019-07-24 ENCOUNTER — Other Ambulatory Visit: Payer: Self-pay

## 2019-07-24 ENCOUNTER — Ambulatory Visit (INDEPENDENT_AMBULATORY_CARE_PROVIDER_SITE_OTHER): Payer: Medicare HMO | Admitting: Family Medicine

## 2019-07-24 DIAGNOSIS — M25561 Pain in right knee: Secondary | ICD-10-CM

## 2019-07-24 DIAGNOSIS — M545 Low back pain, unspecified: Secondary | ICD-10-CM

## 2019-07-24 MED ORDER — BACLOFEN 10 MG PO TABS: 10.0000 mg | ORAL_TABLET | Freq: Three times a day (TID) | ORAL | 0 refills | Status: DC | PRN

## 2019-07-24 MED ORDER — DICLOFENAC SODIUM 1 % TD GEL: 4.0000 g | Freq: Four times a day (QID) | TRANSDERMAL | 0 refills | Status: DC

## 2019-07-24 NOTE — Progress Notes (Signed)
Name: Tammie Lopez   MRN: [id number]    DOB: Jun 18, 1971   Date:07/24/2019       Progress Note  Subjective  Chief Complaint  Chief Complaint  Patient presents with  . Back Pain    follow up    I connected with  Orbie Hurst  on 07/24/19 at  8:00 AM EDT by a video enabled telemedicine application and verified that I am speaking with the correct person using two identifiers.  I discussed the limitations of evaluation and management by telemedicine and the availability of in person appointments. The patient expressed understanding and agreed to proceed. Staff also discussed with the patient that there may be a patient responsible charge related to this service. Patient Location: at home Provider Location: [facility]   HPI  Low back pain: she states symptoms going on for years, however recently getting worse, feels tight and worse at night, no hematuria, or dysuria, no change in bowel movements or rashes, she denies radiculopathy symptoms recently , but she has numbness on left foot that is from [place]. No bowel or bladder incontinence, taking meloxicam and using voltaren gel at night and it seems to help with symptoms, discussed long term nsaid's use and we will try baclofen and topical nsaids instead  Right knee pain: going on for years, worse with activity, voltaren works well for her and we will send a refil   Patient Active Problem List   Diagnosis Date Noted  . Obesity (BMI 30-39.9) 12/06/2017  . Insulin resistance 09/27/2017  . Essential hypertension 09/21/2017  . Right sided sciatica 09/21/2017  . Major depression, recurrent ([place]) 05/24/2017  . Migraine without aura, not intractable 12/09/2016  . Nickel allergy 12/09/2016  . GERD without esophagitis 12/09/2016  . Other neutropenia ([place]) 12/09/2016  . Dyslipidemia 12/09/2016  . Diverticulosis 12/09/2016  . 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 Allergic rhinitis, seasonal 12/09/2016  . Chronic constipation 12/09/2016  .  Vitamin D deficiency 08/06/2013  . MS (multiple sclerosis) ([place]) 04/17/2013    Past Surgical History:  Procedure Laterality Date  . BREAST BIOPSY Left 01/06/2018   pending path  . BREAST BIOPSY Left 01/06/2018   pending path  . BUNIONECTOMY    . CESAREAN SECTION N/A 2006  . GANGLION CYST EXCISION Bilateral    Hands    Family History  Problem Relation Age of Onset  . Diabetes Mother   . Hypertension Mother   . Cancer Father 75       Mouth  . COPD Father   . Hypertension Brother   . Diabetes Brother     Social History   Socioeconomic History  . Marital status: Single    Spouse name: Not on file  . Number of children: 3  . Years of education: Not on file  . Highest education level: High school graduate  Occupational History    Comment: bus driver  Social Needs  . Financial resource strain: Not hard at all  . Food insecurity    Worry: Never true    Inability: Never true  . Transportation needs    Medical: No    Non-medical: No  Tobacco Use  . Smoking status: Never Smoker  . Smokeless tobacco: Never Used  . Tobacco comment: smoking cessation materials not required  Substance and Sexual Activity  . Alcohol use: No  . Drug use: No  . Sexual activity: Not Currently    Partners: Male  Lifestyle  . Physical activity    Days  per week: 5 days    Minutes per session: 40 min  . Stress: To some extent  Relationships  . Social connections    Talks on phone: More than three times a week    Gets together: More than three times a week    Attends religious service: More than 4 times per year    Active member of club or organization: No    Attends meetings of clubs or organizations: Never    Relationship status: Never married  . Intimate partner violence    Fear of current or ex partner: No    Emotionally abused: No    Physically abused: No    Forced sexual activity: No  Other Topics Concern  . 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  amitriptyline (ELAVIL) 25 MG tablet, Take 1 tablet by mouth as needed for sleep., Disp: , Rfl: 2 .  Dimethyl Fumarate (TECFIDERA) 240 MG CPDR, Take 1 capsule (240 mg total) by mouth 2 (two) times daily., Disp: 60 capsule, Rfl: 0 .  fluticasone (FLONASE) 50 MCG/ACT nasal spray, Place 2 sprays into both nostrils daily., Disp: 16 g, Rfl: 0 .  linaclotide (LINZESS) 290 MCG CAPS capsule, Take 1 capsule (290 mcg total) by mouth daily., Disp: 30 capsule, Rfl: 2 .  Multiple Vitamin (MULTIVITAMIN) tablet, Take 1 tablet by mouth daily., Disp: , Rfl:  .  norgestimate-ethinyl estradiol (SPRINTEC 28) 0.25-35 MG-MCG tablet, Take 1 tablet by mouth daily., Disp: 1 Package, Rfl: 11 .  omeprazole (PRILOSEC) 40 MG capsule, TAKE 1 CAPSULE BY MOUTH EVERY DAY, Disp: 90 capsule, Rfl: 0 .  Semaglutide,0.25 or 0.5MG /DOS, (OZEMPIC, 0.25 OR 0.5 MG/DOSE,) 2 MG/1.5ML SOPN, Inject 0.5 mg into the skin once a week., Disp: 3 mL, Rfl: 2 .  SUMAtriptan (IMITREX) 100 MG tablet, Take 1 tablet (100 mg total) by mouth every 2 (two) hours as needed for migraine. May repeat in 2 hours if headache persists or recurs., Disp: 9 tablet, Rfl: 1 .  traZODone (DESYREL) 50 MG tablet, Take 0.5-1 tablets (25-50 mg total) by mouth at bedtime as needed for sleep., Disp: 30 tablet, Rfl: 2  Allergies  Allergen Reactions  . Penicillins Hives    I personally reviewed active problem list, medication list, allergies, family history, social history with the patient/caregiver today.   ROS  Ten systems reviewed and is negative except as mentioned in HPI   Objective  Virtual encounter, vitals not obtained.  There is no height or weight on file to calculate BMI.  Physical Exam  Awake, alert and oriented , good rom of lumbar spine , no rashes  PHQ2/9: Depression screen [facility] 2/9 07/24/2019  07/23/2019 06/18/2019 02/27/2019 11/24/2018  Decreased Interest 0 0 0 0 0  Down, Depressed, Hopeless 0 0 0 0 0  PHQ - 2 Score 0 0 0 0 0  Altered sleeping 0 0 1 0 -  Tired, decreased energy 0 0 1 0 -  Change in appetite 0 0 0 0 -  Feeling bad or failure about yourself  0 0 0 0 -  Trouble concentrating 0 0 0 0 -  Moving slowly or fidgety/restless 0 0 0 0 -  Suicidal thoughts 0 0 0 0 -  PHQ-9 Score 0 0 2 0 -  Difficult doing work/chores Not difficult at all Not difficult at all Not difficult at all - -   PHQ-2/9 Result is negative.    Fall Risk: Fall Risk  07/24/2019 07/23/2019 06/18/2019 02/27/2019 11/24/2018  Falls in the past year? 0 0 0 0 0  Number falls in past yr: 0 0 0 0 0  Injury with Fall? 0 0 0 0 -  Follow up Falls evaluation completed - - - -     Assessment & Plan  1. Bilateral low back pain without sciatica, unspecified chronicity  - baclofen (LIORESAL) 10 MG tablet; Take 1 tablet (10 mg total) by mouth 3 (three) times daily as needed for muscle spasms.  Dispense: 90 each; Refill: 0 - diclofenac sodium (VOLTAREN) 1 % GEL; Apply 4 g topically 4 (four) times daily.  Dispense: 350 g; Refill: 0  2. Anterior knee pain, right  - diclofenac sodium (VOLTAREN) 1 % GEL; Apply 4 g topically 4 (four) times daily.  Dispense: 350 g; Refill: 0  I discussed the assessment and treatment plan with the patient. The patient was provided an opportunity to ask questions and all were answered. The patient agreed with the plan and demonstrated an understanding of the instructions.  The patient was advised to call back or seek an in-person evaluation if the symptoms worsen or if the condition fails to improve as anticipated.  I provided 15  minutes of non-face-to-face time during this encounter.

## 2019-07-26 NOTE — Unmapped (Signed)
[facility] Refill Coordination Note    Specialty Medication(s) to be Shipped:   Neurology: Tecfidera    Other medication(s) to be shipped: n/a     Hannah Barr, DOB: Jan 25, 1971  Phone: [phone] (home)       All above HIPAA information was verified with patient.     Completed refill call assessment today to schedule patient's medication shipment from the [facility] [phone]).       Specialty medication(s) and dose(s) confirmed: Regimen is correct and unchanged.   Changes to medications: [name] reports no changes at this time.  Changes to insurance: No  Questions for the pharmacist: No    Confirmed patient received Welcome Packet with first shipment. The patient will receive a drug information handout for each medication shipped and additional FDA Medication Guides as required.       DISEASE/MEDICATION-SPECIFIC INFORMATION        N/A    SPECIALTY MEDICATION ADHERENCE     Medication Adherence    Patient reported X missed doses in the last month: 0  Specialty Medication: Tecfidera  Patient is on additional specialty medications: No  Patient is on more than two specialty medications: No  Any gaps in refill history greater than 2 weeks in the last 3 months: no  Demonstrates understanding of importance of adherence: yes  Informant: patient                tecfidera 240mg : Patient has 7 days of medication on hand       SHIPPING     Shipping address confirmed in Epic.     Delivery Scheduled: Yes, Expected medication delivery date: 08/01/19.  However, Rx request for refills was sent to the provider as there are none remaining.     Medication will be delivered via Next Day Courier to the home address in Epic WAM.    [name]   [facility] Specialty Technician

## 2019-07-26 NOTE — Unmapped (Signed)
Last Visit Date: 07/04/19  Next Visit Date: Not scheduled    No results found for: JCVIRUSAB, HBSAG, HEPBSAB, HBQT, HEPBCAB, HEPCAB, HIV     No results found for this or any previous visit.    No results found for this or any previous visit.    No results found for this or any previous visit.

## 2019-07-28 MED ORDER — DIMETHYL FUMARATE 240 MG CAPSULE,DELAYED RELEASE
ORAL_CAPSULE | ORAL | 5 refills | 0 days | Status: CP
Start: 2019-07-28 — End: 2020-07-27
  Filled 2019-07-31: qty 60, 30d supply, fill #0

## 2019-07-31 IMAGING — US US BREAST*L* LIMITED INC AXILLA
1 series · 13 of 14 positions shown · non-contrast
Comparison: None.

CLINICAL DATA: 46-year-old female with nonfocal pain involving the
outer bilateral breasts for the past 4 months.

EXAM:
2D DIGITAL DIAGNOSTIC BILATERAL MAMMOGRAM WITH CAD AND ADJUNCT TOMO
LEFT BREAST ULTRASOUND

[Series 1: us breast*left* limited inc axilla · 0.08mm/px · 13 of 14 slices shown]
[im 1/14]
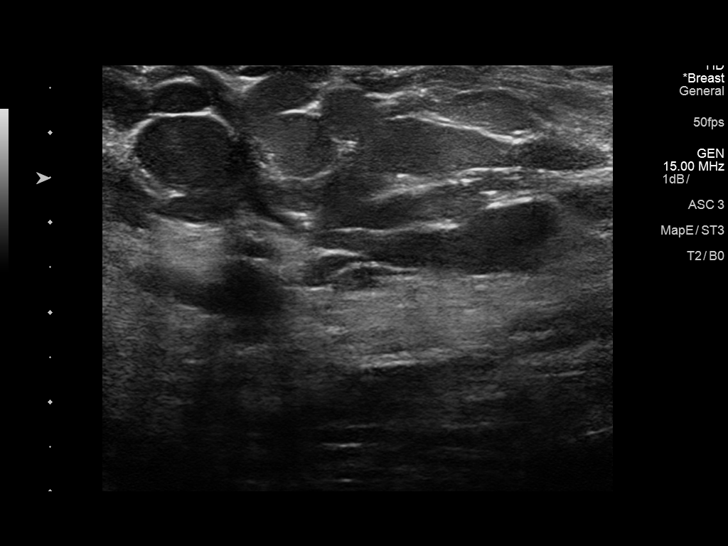
[im 2/14]
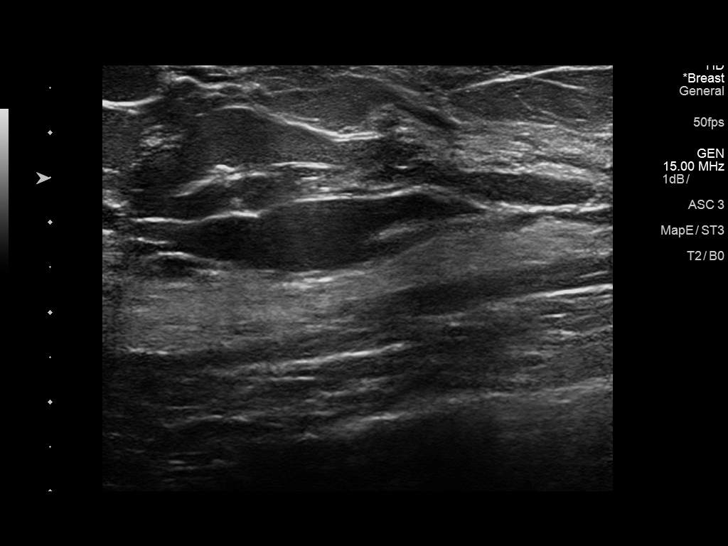
[im 3/14]
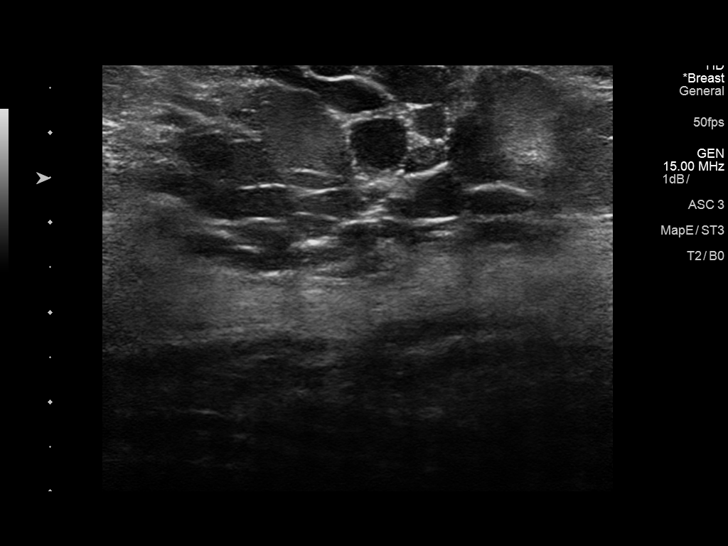
[im 4/14]
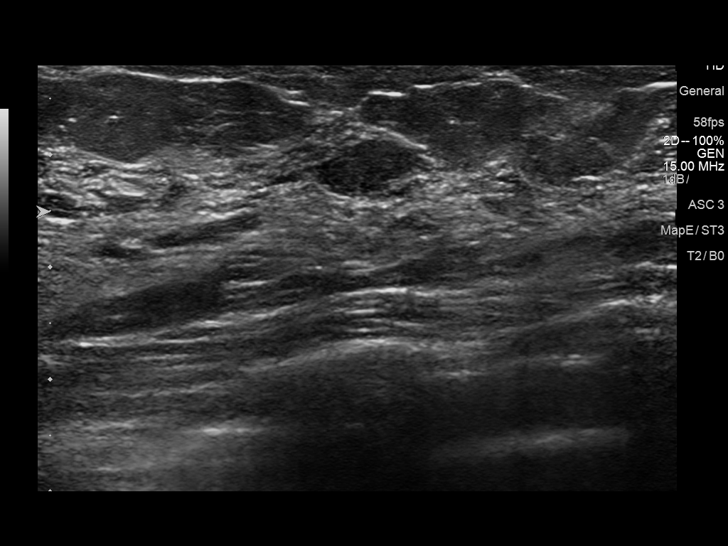
[im 5/14]
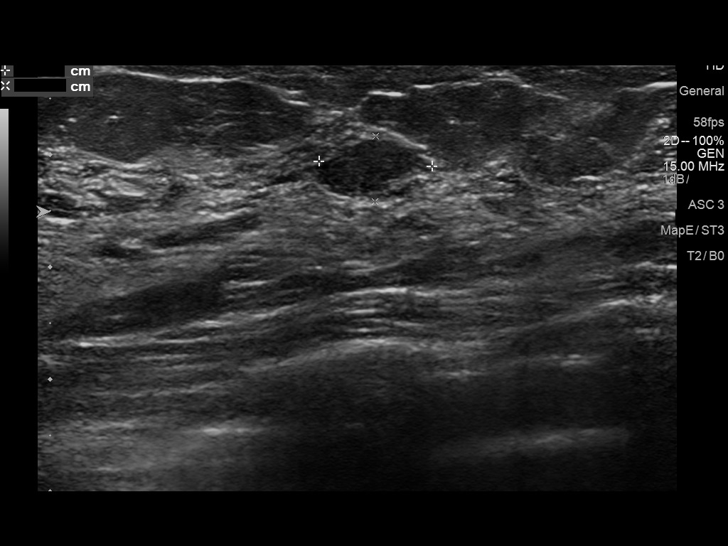
[im 6/14]
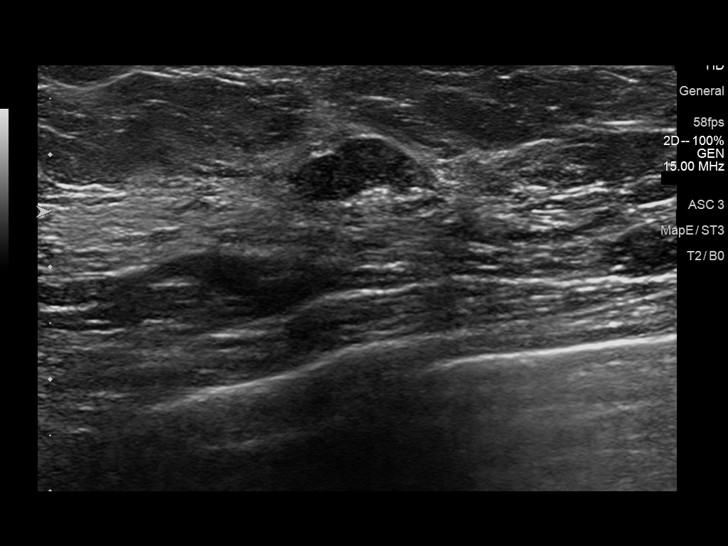
[im 8/14]
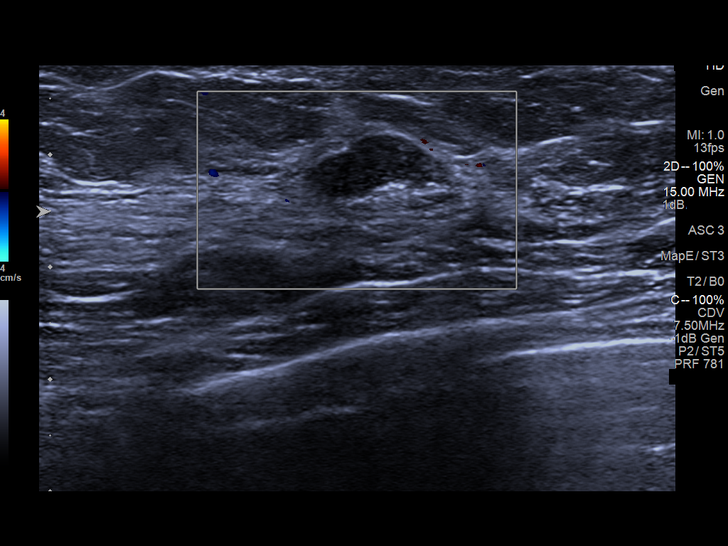
[im 9/14]
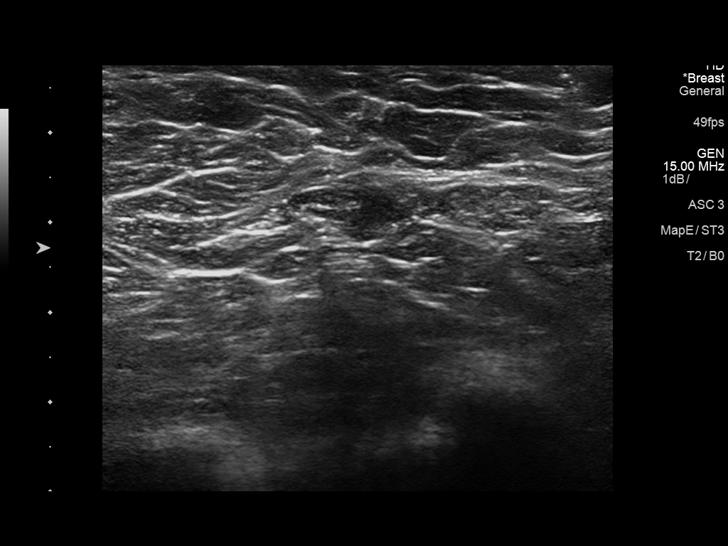
[im 10/14]
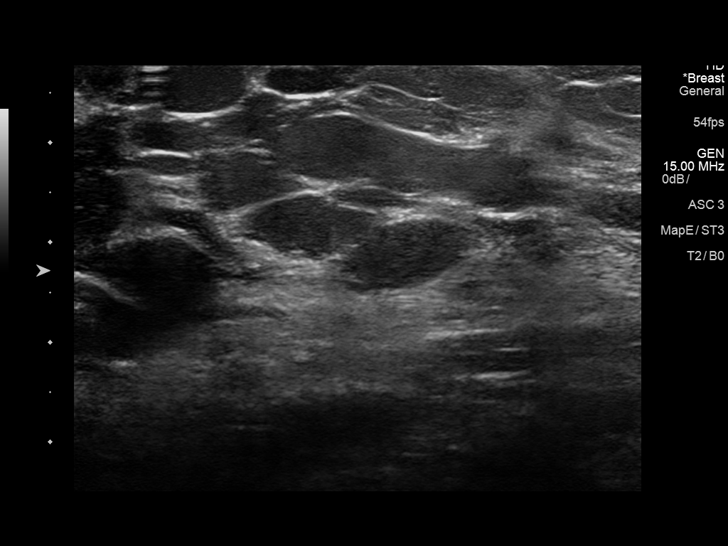
[im 11/14]
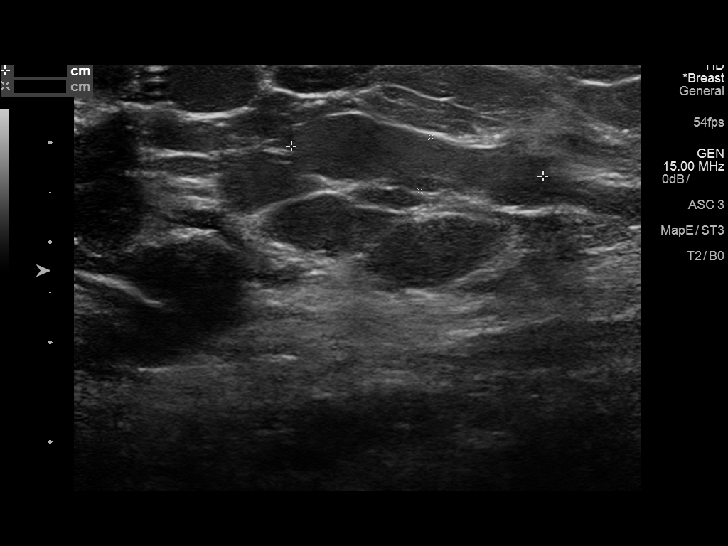
[im 12/14]
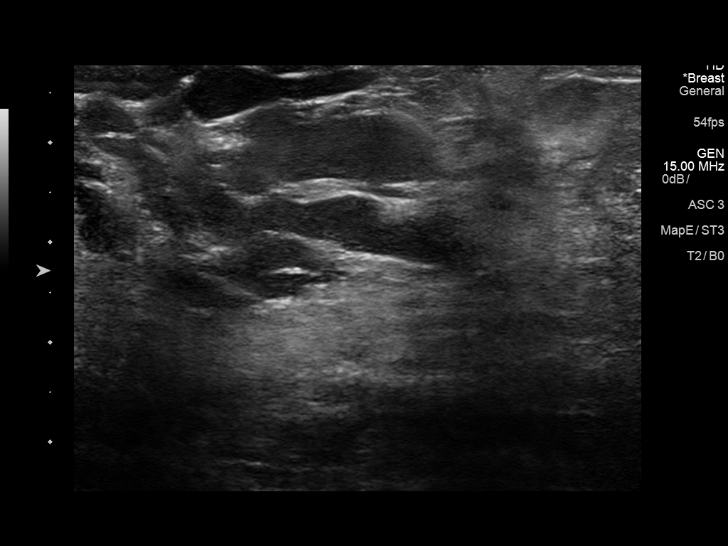
[im 13/14]
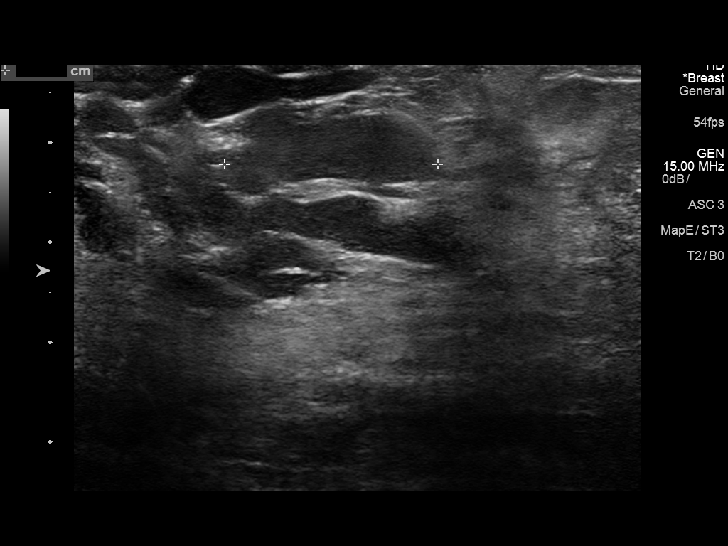
[im 14/14]
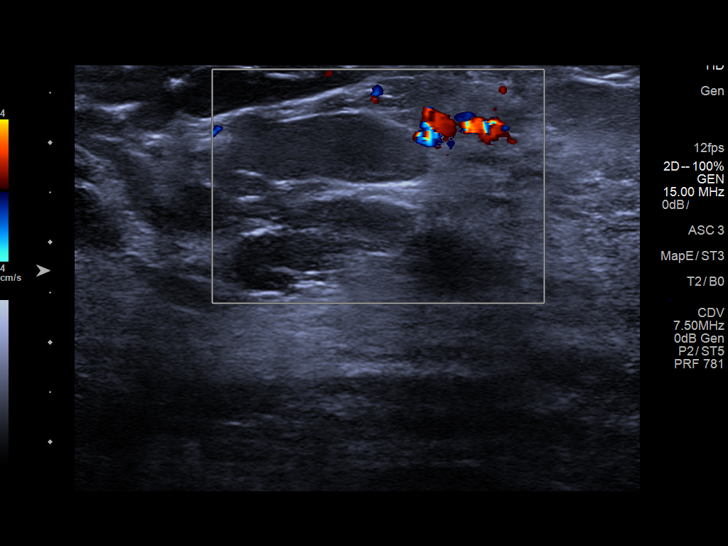

[13 of 14 positions shown; findings below may reference images not displayed]

ACR Breast Density Category c: The breast tissue is heterogeneously
dense, which may obscure small masses.
FINDINGS: No suspicious masses or calcifications are seen in the right breast.
There is an oval mass with obscured margins in the lower outer left
breast best seen on the MLO tomograms measuring approximately
cm..

Mammographic images were processed with CAD.

Physical examination of the outer left breast at the approximate
[DATE] position reveals tenderness with palpation.

Targeted ultrasound of the left breast was performed demonstrating
multiple markedly dilated ducts extending laterally in the left
breast at the 3 o'clock location. At the most peripheral portion of
the dilated ducts is in intraductal mass at 3 o'clock 4 cm from the
nipple measuring 1.3 x 0.6 x 1 cm. This is felt to correspond well
with the mass seen in the left breast at mammography. In addition
there is markedly dilated duct containing mass versus debris at 3
o'clock subareolar measuring 2.5 x 0.6 x 2.1 cm. No lymphadenopathy
seen in the left axilla.
IMPRESSION: 1.  Left breast intraductal mass at 3 o'clock 4 cm from the nipple.

2. Left breast intraductal mass versus debris at 3 o'clock
subareolar.

RECOMMENDATION:
Ultrasound-guided biopsy of the left breast intraductal mass at 3
o'clock 4 cm from nipple and ultrasound-guided biopsy of the left
breast intraductal mass versus debris at 3 o'clock subareolar is
recommended. This will be scheduled for the patient.

I have discussed the findings and recommendations with the patient.
Results were also provided in writing at the conclusion of the
visit. If applicable, a reminder letter will be sent to the patient
regarding the next appointment.

BI-RADS CATEGORY  4: Suspicious.

## 2019-07-31 MED FILL — TECFIDERA 240 MG CAPSULE,DELAYED RELEASE: 30 days supply | Qty: 60 | Fill #0 | Status: AC

## 2019-08-07 ENCOUNTER — Other Ambulatory Visit: Payer: Self-pay | Admitting: Family Medicine

## 2019-08-07 ENCOUNTER — Ambulatory Visit: Admit: 2019-08-07 | Discharge: 2019-08-08 | Payer: MEDICARE

## 2019-08-07 DIAGNOSIS — M545 Low back pain, unspecified: Secondary | ICD-10-CM

## 2019-08-07 DIAGNOSIS — M25561 Pain in right knee: Secondary | ICD-10-CM

## 2019-08-07 DIAGNOSIS — R9082 White matter disease, unspecified: Secondary | ICD-10-CM | POA: Diagnosis not present

## 2019-08-07 DIAGNOSIS — G35 Multiple sclerosis: Secondary | ICD-10-CM | POA: Diagnosis not present

## 2019-08-10 ENCOUNTER — Other Ambulatory Visit: Payer: Self-pay | Admitting: Family Medicine

## 2019-08-10 ENCOUNTER — Ambulatory Visit (INDEPENDENT_AMBULATORY_CARE_PROVIDER_SITE_OTHER): Payer: Medicare HMO | Admitting: Family Medicine

## 2019-08-10 ENCOUNTER — Other Ambulatory Visit: Payer: Self-pay

## 2019-08-10 ENCOUNTER — Encounter: Payer: Self-pay | Admitting: Family Medicine

## 2019-08-10 VITALS — Ht 62.0 in | Wt 168.0 lb

## 2019-08-10 DIAGNOSIS — R030 Elevated blood-pressure reading, without diagnosis of hypertension: Secondary | ICD-10-CM

## 2019-08-10 DIAGNOSIS — H4720 Unspecified optic atrophy: Secondary | ICD-10-CM

## 2019-08-10 DIAGNOSIS — G43009 Migraine without aura, not intractable, without status migrainosus: Secondary | ICD-10-CM | POA: Diagnosis not present

## 2019-08-10 DIAGNOSIS — G44201 Tension-type headache, unspecified, intractable: Secondary | ICD-10-CM | POA: Diagnosis not present

## 2019-08-10 MED ORDER — UBRELVY 50 MG PO TABS: 1.0000 | ORAL_TABLET | Freq: Every day | ORAL | 0 refills | Status: DC | PRN

## 2019-08-10 NOTE — Progress Notes (Signed)
Name: Tammie Lopez   MRN: [id number]    DOB: 02-04-1971   Date:08/10/2019       Progress Note  Subjective  Chief Complaint  Chief Complaint  Patient presents with  . Hypertension    Tender spot behind her left ear to the touch-had MRI Tuesday and BP was high then-when she came out her eye was blood clot red.  . Migraine    Had a really bad migraine start on Saturday-Monday.   . Ear Pain    Right behind her left ear-tenderness    I connected with  Orbie Hurst  on 08/10/19 at  3:40 PM EDT by a video enabled telemedicine application and verified that I am speaking with the correct person using two identifiers.  I discussed the limitations of evaluation and management by telemedicine and the availability of in person appointments. The patient expressed understanding and agreed to proceed. Staff also discussed with the patient that there may be a patient responsible charge related to this service. 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   Headache/HTN: she states Migraine episode started about 5 days ago, went to have MRI three days ago and by the time the study was done her left eye was red, her nuchal area was very tender and tense, and had pain to touch behind left ear. She also had nausea. Her cycle started that day. She took Imitrex three times this week, pain is now 7/10 and is mostly a tightness on nuchal area. No focal deficit, MS is stable.    Patient Active Problem List   Diagnosis Date Noted  . Obesity (BMI 30-39.9) 12/06/2017  . Insulin resistance 09/27/2017  . Essential hypertension 09/21/2017  . Right sided sciatica 09/21/2017  . Major depression, recurrent ([place]) 05/24/2017  . Migraine without aura, not intractable 12/09/2016  . Nickel allergy 12/09/2016  . GERD without esophagitis 12/09/2016  . Other  neutropenia ([place]) 12/09/2016  . Dyslipidemia 12/09/2016  . Diverticulosis 12/09/2016  . 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 Allergic rhinitis, seasonal 12/09/2016  . Chronic constipation 12/09/2016  . Vitamin D deficiency 08/06/2013  . MS (multiple sclerosis) ([place]) 04/17/2013    Past Surgical History:  Procedure Laterality Date  . BREAST BIOPSY Left 01/06/2018   pending path  . BREAST BIOPSY Left 01/06/2018   pending path  . BUNIONECTOMY    . CESAREAN SECTION N/A 2006  . GANGLION CYST EXCISION Bilateral    Hands    Family History  Problem Relation Age of Onset  . Diabetes Mother   . Hypertension Mother   . Cancer Father 75       Mouth  . COPD Father   . Hypertension Brother   . Diabetes Brother     Social History   Socioeconomic History  . Marital status: Single    Spouse name: Not on file  . Number of children: 3  . Years of education: Not on file  . Highest education level: High school graduate  Occupational History    Comment: bus driver  Social Needs  . Financial resource strain: Not hard at all  . Food insecurity    Worry: Never true    Inability: Never true  . Transportation needs    Medical: No    Non-medical: No  Tobacco Use  . Smoking status: Never Smoker  .  Smokeless tobacco: Never Used  . Tobacco comment: smoking cessation materials not required  Substance and Sexual Activity  . Alcohol use: No  . Drug use: No  . Sexual activity: Not Currently    Partners: Male  Lifestyle  . Physical activity    Days per week: 5 days    Minutes per session: 40 min  . Stress: To some extent  Relationships  . Social connections    Talks on phone: More than three times a week    Gets together: More than three times a week    Attends religious service: More than 4 times per year    Active member of club or organization: No    Attends meetings of clubs or organizations: Never    Relationship status: Never married  . Intimate partner violence    Fear of current or ex  partner: No    Emotionally abused: No    Physically abused: No    Forced sexual activity: No  Other Topics Concern  . 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  amitriptyline (ELAVIL) 25 MG tablet, Take 1 tablet by mouth as needed for sleep., Disp: , Rfl: 2 .  baclofen (LIORESAL) 10 MG tablet, Take 1 tablet (10 mg total) by mouth 3 (three) times daily as needed for muscle spasms., Disp: 90 each, Rfl: 0 .  diclofenac sodium (VOLTAREN) 1 % GEL, APPLY 4 G TOPICALLY 4 (FOUR) TIMES DAILY., Disp: 300 g, Rfl: 0 .  Dimethyl Fumarate (TECFIDERA) 240 MG CPDR, Take 1 capsule (240 mg total) by mouth 2 (two) times daily., Disp: 60 capsule, Rfl: 0 .  fluticasone (FLONASE) 50 MCG/ACT nasal spray, Place 2 sprays into both nostrils daily., Disp: 16 g, Rfl: 0 .  linaclotide (LINZESS) 290 MCG CAPS capsule, Take 1 capsule (290 mcg total) by mouth daily., Disp: 30 capsule, Rfl: 2 .  Multiple Vitamin (MULTIVITAMIN) tablet, Take 1 tablet by mouth daily., Disp: , Rfl:  .  norgestimate-ethinyl estradiol (SPRINTEC 28) 0.25-35 MG-MCG tablet, Take 1 tablet by mouth daily., Disp: 1 Package, Rfl: 11 .  omeprazole (PRILOSEC) 40 MG capsule, TAKE 1 CAPSULE BY MOUTH EVERY DAY, Disp: 90 capsule, Rfl: 0 .  Semaglutide,0.25 or 0.5MG /DOS, (OZEMPIC, 0.25 OR 0.5 MG/DOSE,) 2 MG/1.5ML SOPN, Inject 0.5 mg into the skin once a week., Disp: 3 mL, Rfl: 2 .  SUMAtriptan (IMITREX) 100 MG tablet, Take 1 tablet (100 mg total) by mouth every 2 (two) hours as needed for migraine. May repeat in 2 hours if headache persists or recurs., Disp: 9 tablet, Rfl: 1 .  traZODone (DESYREL) 50 MG tablet, Take 0.5-1 tablets (25-50 mg total) by mouth at bedtime as needed for sleep., Disp: 30 tablet, Rfl: 2  Allergies  Allergen Reactions  . Penicillins Hives    I personally  reviewed active problem list, medication list, allergies, family history, social history, health maintenance with the patient/caregiver today.   ROS  Ten systems reviewed and is negative except as mentioned in HPI  Objective  Virtual encounter, vitals not obtained.  Body mass index is 30.73 kg/m.  Physical Exam  Awake, alert and oriented, cranial nerves seems intact  PHQ2/9: Depression screen [facility] 2/9 08/10/2019 07/24/2019 07/23/2019 06/18/2019 02/27/2019  Decreased Interest 0 0 0 0 0  Down,  Depressed, Hopeless 0 0 0 0 0  PHQ - 2 Score 0 0 0 0 0  Altered sleeping 0 0 0 1 0  Tired, decreased energy 0 0 0 1 0  Change in appetite 0 0 0 0 0  Feeling bad or failure about yourself  0 0 0 0 0  Trouble concentrating 0 0 0 0 0  Moving slowly or fidgety/restless 0 0 0 0 0  Suicidal thoughts 0 0 0 0 0  PHQ-9 Score 0 0 0 2 0  Difficult doing work/chores Not difficult at all Not difficult at all Not difficult at all Not difficult at all -  Some recent data might be hidden   PHQ-2/9 Result is negative.    Fall Risk: Fall Risk  08/10/2019 07/24/2019 07/23/2019 06/18/2019 02/27/2019  Falls in the past year? 0 0 0 0 0  Number falls in past yr: 0 0 0 0 0  Injury with Fall? 0 0 0 0 0  Follow up - Falls evaluation completed - - -     Assessment & Plan   1. Migraine without aura and without status migrainosus, not intractable  - Ubrogepant (UBRELVY) 50 MG TABS; Take 1 tablet by mouth daily as needed. If imitrex does not work  Dispense: 10 tablet; Refill: 0  2. Acute intractable tension-type headache  Try baclofen as discussed during your visit today   3. Optic atrophy, left eye   I discussed the assessment and treatment plan with the patient. The patient was provided an opportunity to ask questions and all were answered. The patient agreed with the plan and demonstrated an understanding of the instructions.  4. Elevated BP without diagnosis of hypertension  Staying around 140 at home,  advised to avoid salt and monitor, return sooner if no improvement once headache resolves   The patient was advised to call back or seek an in-person evaluation if the symptoms worsen or if the condition fails to improve as anticipated.  I provided minutes 25 of non-face-to-face time during this encounter.

## 2019-08-12 ENCOUNTER — Other Ambulatory Visit: Payer: Self-pay | Admitting: Family Medicine

## 2019-08-12 DIAGNOSIS — G43009 Migraine without aura, not intractable, without status migrainosus: Secondary | ICD-10-CM

## 2019-08-15 ENCOUNTER — Other Ambulatory Visit: Payer: Self-pay | Admitting: Family Medicine

## 2019-08-15 DIAGNOSIS — M545 Low back pain, unspecified: Secondary | ICD-10-CM

## 2019-08-15 DIAGNOSIS — K219 Gastro-esophageal reflux disease without esophagitis: Secondary | ICD-10-CM

## 2019-08-15 NOTE — Telephone Encounter (Signed)
Requested medication (s) are due for refill today: yes  Requested medication (s) are on the active medication list: yes  Last refill:  07/24/2019  Future visit scheduled: yes  Notes to clinic:  Refill cannot be delegated    Requested Prescriptions  Pending Prescriptions Disp Refills   baclofen (LIORESAL) 10 MG tablet [Pharmacy Med Name: BACLOFEN 10 MG TABLET] 90 tablet 0    Sig: TAKE 1 TABLET BY MOUTH THREE TIMES A DAY AS NEEDED FOR MUSCLE SPASMS     Not Delegated - Analgesics:  Muscle Relaxants Failed - 08/15/2019 11:32 AM      Failed - This refill cannot be delegated      Passed - Valid encounter within last 6 months    Recent Outpatient Visits          5 days ago Migraine without aura and without status migrainosus, not intractable   [facility], Drue Stager, MD   3 weeks ago Bilateral low back pain without sciatica, unspecified chronicity   [facility] Steele Sizer, MD   3 weeks ago Well adult exam   [facility] Steele Sizer, MD   1 month ago MS (multiple sclerosis) [facility])   [facility] Steele Sizer, MD   5 months ago MS (multiple sclerosis) [facility])   [facility] Steele Sizer, MD      Future Appointments            In 1 month Steele Sizer, MD [facility], [place]   In 2 months Steele Sizer, MD [facility], [place]   In 3 months  [facility], [facility]

## 2019-08-23 NOTE — Unmapped (Signed)
[facility] Refill Coordination Note    Specialty Medication(s) to be Shipped:   Neurology: Tecfidera    Other medication(s) to be shipped: n/a     Hannah Barr, DOB: 1971-06-27  Phone: [phone] (home)       All above HIPAA information was verified with patient.     Completed refill call assessment today to schedule patient's medication shipment from the [facility] [phone]).       Specialty medication(s) and dose(s) confirmed: Regimen is correct and unchanged.   Changes to medications: [name] reports no changes at this time.  Changes to insurance: No  Questions for the pharmacist: No    Confirmed patient received Welcome Packet with first shipment. The patient will receive a drug information handout for each medication shipped and additional FDA Medication Guides as required.       DISEASE/MEDICATION-SPECIFIC INFORMATION        N/A    SPECIALTY MEDICATION ADHERENCE     Medication Adherence    Patient reported X missed doses in the last month: 0  Specialty Medication: Tecfidera  Patient is on additional specialty medications: No  Patient is on more than two specialty medications: No  Any gaps in refill history greater than 2 weeks in the last 3 months: no  Demonstrates understanding of importance of adherence: yes  Informant: patient                tecfidera 240mg : Patient has 14 days of medication on hand      SHIPPING     Shipping address confirmed in Epic.     Delivery Scheduled: Yes, Expected medication delivery date: 08/30/19.     Medication will be delivered via Next Day Courier to the home address in Epic WAM.    [name]   [facility] Specialty Technician

## 2019-08-29 ENCOUNTER — Encounter: Payer: Self-pay | Admitting: Family Medicine

## 2019-08-29 ENCOUNTER — Other Ambulatory Visit: Payer: Self-pay

## 2019-08-29 ENCOUNTER — Ambulatory Visit (INDEPENDENT_AMBULATORY_CARE_PROVIDER_SITE_OTHER): Payer: Medicare HMO | Admitting: Family Medicine

## 2019-08-29 DIAGNOSIS — R110 Nausea: Secondary | ICD-10-CM | POA: Diagnosis not present

## 2019-08-29 DIAGNOSIS — J0100 Acute maxillary sinusitis, unspecified: Secondary | ICD-10-CM

## 2019-08-29 MED ORDER — AZITHROMYCIN 500 MG PO TABS: 500.0000 mg | ORAL_TABLET | Freq: Every day | ORAL | 0 refills | Status: DC

## 2019-08-29 MED ORDER — ONDANSETRON HCL 4 MG PO TABS: 4.0000 mg | ORAL_TABLET | Freq: Three times a day (TID) | ORAL | 0 refills | Status: DC | PRN

## 2019-08-29 MED FILL — TECFIDERA 240 MG CAPSULE,DELAYED RELEASE: 30 days supply | Qty: 60 | Fill #1 | Status: AC

## 2019-08-29 MED FILL — TECFIDERA 240 MG CAPSULE,DELAYED RELEASE: ORAL | 30 days supply | Qty: 60 | Fill #1

## 2019-08-29 NOTE — Progress Notes (Signed)
Name: Tammie Lopez   MRN: [id number]    DOB: 1971-06-07   Date:08/29/2019       Progress Note  Subjective  Chief Complaint  Chief Complaint  Patient presents with  . Sinusitis    Lots of sinus pressure and drainage into her throat.    I connected with  MAKAILEY Lopez  on 08/29/19 at  3:20 PM EDT by a video enabled telemedicine application and verified that I am speaking with the correct person using two identifiers.  I discussed the limitations of evaluation and management by telemedicine and the availability of in person appointments. The patient expressed understanding and agreed to proceed. Staff also discussed with the patient that there may be a patient responsible charge related to this service. Patient Location:  At a store - by herself  Provider Location: [facility]  HPI  Sinus pain: she states symptoms started one week ago, initially with a headache, followed by post-nasal drainage, facial pressure. She states the drainage is so intense that is making her feel nauseated. She has been taking sudafed and has stopped her allergy medications. She denies fever or chills. No rashes. Throat feels irritated from drainage.   Patient Active Problem List   Diagnosis Date Noted  . Obesity (BMI 30-39.9) 12/06/2017  . Insulin resistance 09/27/2017  . Essential hypertension 09/21/2017  . Right sided sciatica 09/21/2017  . Major depression, recurrent ([place]) 05/24/2017  . Migraine without aura, not intractable 12/09/2016  . Nickel allergy 12/09/2016  . GERD without esophagitis 12/09/2016  . Other neutropenia ([place]) 12/09/2016  . Dyslipidemia 12/09/2016  . Diverticulosis 12/09/2016  . 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 Allergic rhinitis, seasonal 12/09/2016  . Chronic constipation 12/09/2016  . Vitamin D deficiency 08/06/2013  . MS (multiple sclerosis) ([place]) 04/17/2013    Social History   Tobacco Use  . Smoking status: Never Smoker  . Smokeless tobacco: Never Used  .  Tobacco comment: smoking cessation materials not required  Substance Use Topics  . Alcohol use: No     Current Outpatient Medications:  .  amitriptyline (ELAVIL) 25 MG tablet, Take 1 tablet by mouth as needed for sleep., Disp: , Rfl: 2 .  baclofen (LIORESAL) 10 MG tablet, TAKE 1 TABLET BY MOUTH THREE TIMES A DAY AS NEEDED FOR MUSCLE SPASMS, Disp: 90 tablet, Rfl: 0 .  diclofenac sodium (VOLTAREN) 1 % GEL, APPLY 4 G TOPICALLY 4 (FOUR) TIMES DAILY., Disp: 300 g, Rfl: 0 .  Dimethyl Fumarate (TECFIDERA) 240 MG CPDR, Take 1 capsule (240 mg total) by mouth 2 (two) times daily., Disp: 60 capsule, Rfl: 0 .  Erenumab-aooe (AIMOVIG) 140 MG/ML SOAJ, Inject 140 mg into the skin every 30 (thirty) days., Disp: 1 mL, Rfl: 5 .  fluticasone (FLONASE) 50 MCG/ACT nasal spray, Place 2 sprays into both nostrils daily., Disp: 16 g, Rfl: 0 .  linaclotide (LINZESS) 290 MCG CAPS capsule, Take 1 capsule (290 mcg total) by mouth daily., Disp: 30 capsule, Rfl: 2 .  Multiple Vitamin (MULTIVITAMIN) tablet, Take 1 tablet by mouth daily., Disp: , Rfl:  .  norgestimate-ethinyl estradiol (SPRINTEC 28) 0.25-35 MG-MCG tablet, Take 1 tablet by mouth daily., Disp: 1 Package, Rfl: 11 .  omeprazole (PRILOSEC) 40 MG capsule, TAKE 1 CAPSULE BY MOUTH EVERY DAY, Disp: 90 capsule, Rfl: 0 .  Semaglutide,0.25 or 0.5MG /DOS, (OZEMPIC, 0.25 OR 0.5 MG/DOSE,) 2 MG/1.5ML SOPN, Inject 0.5 mg into the skin once a week., Disp: 3 mL, Rfl: 2 .  SUMAtriptan (IMITREX) 100 MG tablet,  TAKE 1 TABLET (100 MG TOTAL) BY MOUTH EVERY 2 (TWO) HOURS AS NEEDED FOR MIGRAINE. MAY REPEAT IN 2 HOURS IF HEADACHE PERSISTS OR RECURS., Disp: 9 tablet, Rfl: 1 .  traZODone (DESYREL) 50 MG tablet, Take 0.5-1 tablets (25-50 mg total) by mouth at bedtime as needed for sleep., Disp: 30 tablet, Rfl: 2  Allergies  Allergen Reactions  . Penicillins Hives    I personally reviewed active problem list, medication list, allergies, family history, social history, health maintenance  with the patient/caregiver today.  ROS   Ten systems reviewed and is negative except as mentioned in HPI   Objective  Virtual encounter, vitals not obtained.  There is no height or weight on file to calculate BMI.  Nursing Note and Vital Signs reviewed.  Physical Exam  Awake, alert and oriented, pain during palpation of left maxillary sinus  Assessment & Plan  1. Acute non-recurrent maxillary sinusitis  Advised patient to hold off on starting antibiotics, advised to use saline spray, neti pot, mucinex bid, increase fluids, resume nasal steroid and also allergy medications, if no improvement in 4 days to start antibiotics.  - azithromycin (ZITHROMAX) 500 MG tablet; Take 1 tablet (500 mg total) by mouth daily.  Dispense: 3 tablet; Refill: 0 - ondansetron (ZOFRAN) 4 MG tablet; Take 1 tablet (4 mg total) by mouth every 8 (eight) hours as needed for nausea or vomiting.  Dispense: 12 tablet; Refill: 0  2. Nausea  - ondansetron (ZOFRAN) 4 MG tablet; Take 1 tablet (4 mg total) by mouth every 8 (eight) hours as needed for nausea or vomiting.  Dispense: 12 tablet; Refill: 0  -Red flags and when to present for emergency care or RTC including fever >101.30F, chest pain, shortness of breath, new/worsening/un-resolving symptoms, reviewed with patient at time of visit. Follow up and care instructions discussed and provided in AVS. - I discussed the assessment and treatment plan with the patient. The patient was provided an opportunity to ask questions and all were answered. The patient agreed with the plan and demonstrated an understanding of the instructions.  I provided 15  minutes of non-face-to-face time during this encounter.  Tammie Chance, MD

## 2019-08-30 ENCOUNTER — Encounter: Payer: Self-pay | Admitting: *Deleted

## 2019-09-08 ENCOUNTER — Other Ambulatory Visit: Payer: Self-pay | Admitting: Family Medicine

## 2019-09-08 DIAGNOSIS — G4709 Other insomnia: Secondary | ICD-10-CM

## 2019-09-14 ENCOUNTER — Ambulatory Visit
Admission: RE | Admit: 2019-09-14 | Discharge: 2019-09-14 | Disposition: A | Discharge status: Home / Self Care | Payer: Medicare HMO | Source: Ambulatory Visit | Attending: Family Medicine | Admitting: Family Medicine

## 2019-09-14 DIAGNOSIS — R928 Other abnormal and inconclusive findings on diagnostic imaging of breast: Secondary | ICD-10-CM | POA: Diagnosis not present

## 2019-09-14 DIAGNOSIS — R922 Inconclusive mammogram: Secondary | ICD-10-CM | POA: Diagnosis not present

## 2019-09-18 ENCOUNTER — Encounter: Payer: Self-pay | Admitting: Family Medicine

## 2019-09-18 ENCOUNTER — Other Ambulatory Visit (HOSPITAL_COMMUNITY)
Admission: RE | Admit: 2019-09-18 | Discharge: 2019-09-18 | Disposition: A | Discharge status: Home / Self Care | Payer: Medicare HMO | Source: Ambulatory Visit | Attending: Family Medicine | Admitting: Family Medicine

## 2019-09-18 ENCOUNTER — Other Ambulatory Visit: Payer: Self-pay

## 2019-09-18 ENCOUNTER — Ambulatory Visit (INDEPENDENT_AMBULATORY_CARE_PROVIDER_SITE_OTHER): Payer: Medicare HMO | Admitting: Family Medicine

## 2019-09-18 VITALS — BP 120/90 | HR 92 | Temp 97.1°F | Resp 16 | Ht 62.0 in | Wt 160.9 lb

## 2019-09-18 DIAGNOSIS — E8881 Metabolic syndrome: Secondary | ICD-10-CM

## 2019-09-18 DIAGNOSIS — B379 Candidiasis, unspecified: Secondary | ICD-10-CM | POA: Insufficient documentation

## 2019-09-18 DIAGNOSIS — D708 Other neutropenia: Secondary | ICD-10-CM | POA: Diagnosis not present

## 2019-09-18 DIAGNOSIS — N921 Excessive and frequent menstruation with irregular cycle: Secondary | ICD-10-CM

## 2019-09-18 DIAGNOSIS — G35 Multiple sclerosis: Secondary | ICD-10-CM

## 2019-09-18 DIAGNOSIS — R69 Illness, unspecified: Secondary | ICD-10-CM | POA: Diagnosis not present

## 2019-09-18 DIAGNOSIS — N939 Abnormal uterine and vaginal bleeding, unspecified: Secondary | ICD-10-CM | POA: Diagnosis not present

## 2019-09-18 DIAGNOSIS — E785 Hyperlipidemia, unspecified: Secondary | ICD-10-CM

## 2019-09-18 DIAGNOSIS — Z79899 Other long term (current) drug therapy: Secondary | ICD-10-CM

## 2019-09-18 DIAGNOSIS — E88819 Insulin resistance, unspecified: Secondary | ICD-10-CM

## 2019-09-18 DIAGNOSIS — F325 Major depressive disorder, single episode, in full remission: Secondary | ICD-10-CM

## 2019-09-18 DIAGNOSIS — M545 Low back pain, unspecified: Secondary | ICD-10-CM

## 2019-09-18 DIAGNOSIS — E559 Vitamin D deficiency, unspecified: Secondary | ICD-10-CM

## 2019-09-18 DIAGNOSIS — G8929 Other chronic pain: Secondary | ICD-10-CM

## 2019-09-18 DIAGNOSIS — G35D Multiple sclerosis, unspecified: Secondary | ICD-10-CM

## 2019-09-18 LAB — POCT URINE PREGNANCY: Preg Test, Ur: NEGATIVE

## 2019-09-18 MED ORDER — MELOXICAM 15 MG PO TABS: 15.0000 mg | ORAL_TABLET | Freq: Every day | ORAL | 0 refills | Status: DC

## 2019-09-18 NOTE — Progress Notes (Signed)
Name: Tammie Lopez   MRN: [id number]    DOB: 07/16/1971   Date:09/18/2019       Progress Note  Subjective  Chief Complaint  Chief Complaint  Patient presents with  . Hypertension  . Migraine  . Depression  . Multiple Sclerosis  . Dyslipidemia  . Vaginal Bleeding    Patient has been experiencing vaginal bleeding since starting her birth control.    HPI  Breakthrough bleeding: she resumed ocp after her CPE, she has skipped any doses, but is on the second package now and has noticed spotting and sometimes bright blood when she wipes. She states she has not been taking ocp at the same time. We will check urine for pregnancy test and some labs today, explained she needs to set an alarm and take it at the same time daily . LMP 09/01/2019  Low back pain: she states symptoms going on for years, however over the past few months it has been worse,  feels tight and worse at night, no hematuria, or dysuria, no change in bowel movements or rashes, she denies radiculopathy symptoms recently , but she has numbness on left foot that is from [place]. No bowel or bladder incontinence, she is off meloxicam now, but has been using voltaren gel and baclofen. She states pain is still intense at times, pain is described as tightness and aching sensation that is constant 7/10 at this time, feels stiff and sometimes hard to move. We will refer her to Ortho. She had PT in [facility] and states it did not help   MS: she is stable, seeingneurologist, Dr. Varney Daily at [facility], provigil, gabapentin , she is currently  onnortriptyline and Tecfidera. She still has intermittent numbness on left side but not as bad and not as frequent, she also has weakness on right lower leg and sometimes she drags her right foot  No bowel or bladder incontinence. . She states when she is hot it gets worse   Insulin resistance: shewas onTrulicity however not losing weight, shetried metformin but did not help either, we tried  Victoza but she stopped it because it did not work. Morbid obesity;she states her weight now is  160 lbs, she states she is not drinking sodas and is cutting down on carbs and has been drinking more water and is also walking 45 minutes on her treadmill 4-5 times a week, we switched to [place] but she stopped medication last month because it also did not work.   Migraine headaches: usually before her cycles and also one more time per month, takes imitrex prn. Migraine is described as throbbing, behind her right eye. She has associated nausea, no vomtiing, also has photophobia and phonophobia.She states episodes still once or twice a week, she has not started Aimovig yet, but will start today   Insomnia: still has difficulty staying asleep, and sometimes falling asleep, taking trazodone prn  When she takes it she sleeps well.She takes half to one qhs, she sleeps 4-5 hours sometimes 6 hours. Discussed sleep hygiene .   Leucopenia: we will recheck labs today   Chronic constipation: she takes Linzess most days - she forgets at times- she states since she increase water intake bowel movements have improved   MMD: she used to take medications, phqh 9 is negative, she got very overwhelmed with diagnoses of MS, and now back pain  Dysphagia: she states beef gets stuck in her esophagus, discussed referral to GI , may need esophageal dilation She will call to  schedule it, already had a referral   Patient Active Problem List   Diagnosis Date Noted  . Obesity (BMI 30-39.9) 12/06/2017  . Insulin resistance 09/27/2017  . Essential hypertension 09/21/2017  . Right sided sciatica 09/21/2017  . Major depression, recurrent ([place]) 05/24/2017  . Migraine without aura, not intractable 12/09/2016  . Nickel allergy 12/09/2016  . GERD without esophagitis 12/09/2016  . Other neutropenia ([place]) 12/09/2016  . Dyslipidemia 12/09/2016  . Diverticulosis 12/09/2016  . 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 Allergic rhinitis,  seasonal 12/09/2016  . Chronic constipation 12/09/2016  . Vitamin D deficiency 08/06/2013  . MS (multiple sclerosis) ([place]) 04/17/2013    Past Surgical History:  Procedure Laterality Date  . BREAST BIOPSY Left 01/06/2018   neg  . BREAST BIOPSY Left 01/06/2018   neg  . BUNIONECTOMY    . CESAREAN SECTION N/A 2006  . GANGLION CYST EXCISION Bilateral    Hands    Family History  Problem Relation Age of Onset  . Diabetes Mother   . Hypertension Mother   . Cancer Father 75       Mouth  . COPD Father   . Hypertension Brother   . Diabetes Brother     Social History   Socioeconomic History  . Marital status: Single    Spouse name: Not on file  . Number of children: 3  . Years of education: Not on file  . Highest education level: High school graduate  Occupational History    Comment: bus driver  Social Needs  . Financial resource strain: Not hard at all  . Food insecurity    Worry: Never true    Inability: Never true  . Transportation needs    Medical: No    Non-medical: No  Tobacco Use  . Smoking status: Never Smoker  . Smokeless tobacco: Never Used  . Tobacco comment: smoking cessation materials not required  Substance and Sexual Activity  . Alcohol use: No  . Drug use: No  . Sexual activity: Not Currently    Partners: Male  Lifestyle  . Physical activity    Days per week: 5 days    Minutes per session: 40 min  . Stress: To some extent  Relationships  . Social connections    Talks on phone: More than three times a week    Gets together: More than three times a week    Attends religious service: More than 4 times per year    Active member of club or organization: No    Attends meetings of clubs or organizations: Never    Relationship status: Never married  . Intimate partner violence    Fear of current or ex partner: No    Emotionally abused: No    Physically abused: No    Forced sexual activity: No  Other Topics Concern  . 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  amitriptyline (ELAVIL) 25 MG tablet, Take 1 tablet by mouth as needed for sleep., Disp: , Rfl: 2 .  baclofen (LIORESAL) 10 MG tablet, TAKE 1 TABLET BY MOUTH THREE TIMES A DAY AS NEEDED FOR MUSCLE SPASMS, Disp: 90 tablet, Rfl: 0 .  diclofenac sodium (VOLTAREN) 1 % GEL, APPLY 4 G TOPICALLY 4 (FOUR) TIMES DAILY., Disp: 300 g, Rfl: 0 .  Dimethyl Fumarate (TECFIDERA) 240 MG CPDR, Take 1 capsule (240 mg total) by mouth 2 (two) times daily., Disp: 60 capsule, Rfl: 0 .  Erenumab-aooe (AIMOVIG) 140 MG/ML SOAJ, Inject 140 mg into the skin every 30 (thirty) days., Disp: 1 mL, Rfl: 5 .  fluticasone (FLONASE) 50 MCG/ACT nasal spray, Place 2 sprays into both nostrils daily., Disp: 16 g, Rfl: 0 .  linaclotide (LINZESS) 290 MCG CAPS capsule, Take 1 capsule (290 mcg total) by mouth daily., Disp: 30 capsule, Rfl: 2 .  Multiple Vitamin (MULTIVITAMIN) tablet, Take 1 tablet by mouth daily., Disp: , Rfl:  .  norgestimate-ethinyl estradiol (SPRINTEC 28) 0.25-35 MG-MCG tablet, Take 1 tablet by mouth daily., Disp: 1 Package, Rfl: 11 .  omeprazole (PRILOSEC) 40 MG capsule, TAKE 1 CAPSULE BY MOUTH EVERY DAY, Disp: 90 capsule, Rfl: 0 .  ondansetron (ZOFRAN) 4 MG tablet, Take 1 tablet (4 mg total) by mouth every 8 (eight) hours as needed for nausea or vomiting., Disp: 12 tablet, Rfl: 0 .  SUMAtriptan (IMITREX) 100 MG tablet, TAKE 1 TABLET (100 MG TOTAL) BY MOUTH EVERY 2 (TWO) HOURS AS NEEDED FOR MIGRAINE. MAY REPEAT IN 2 HOURS IF HEADACHE PERSISTS OR RECURS., Disp: 9 tablet, Rfl: 1 .  traZODone (DESYREL) 50 MG tablet, TAKE 0.5-1 TABLETS (25-50 MG TOTAL) BY MOUTH AT BEDTIME AS NEEDED FOR SLEEP., Disp: 30 tablet, Rfl: 2 .  azithromycin (ZITHROMAX) 500 MG tablet, Take 1 tablet (500 mg total) by mouth daily. (Patient not taking: Reported  on 09/18/2019), Disp: 3 tablet, Rfl: 0 .  Semaglutide,0.25 or 0.5MG /DOS, (OZEMPIC, 0.25 OR 0.5 MG/DOSE,) 2 MG/1.5ML SOPN, Inject 0.5 mg into the skin once a week. (Patient not taking: Reported on 09/18/2019), Disp: 3 mL, Rfl: 2  Allergies  Allergen Reactions  . Penicillins Hives    I personally reviewed active problem list, medication list, allergies, family history, social history with the patient/caregiver today.   ROS  Constitutional: Negative for fever , positive for weight change - when compared to last year .  Respiratory: Negative for cough , she has  shortness of breath with activity    Cardiovascular: Negative for chest pain or palpitations.  Gastrointestinal: Negative for abdominal pain, no bowel changes.  Musculoskeletal: positive  for intermittent gait problem but no joint swelling.  Skin: Negative for rash.  Neurological: Negative for dizziness or headache.  No other specific complaints in a complete review of systems (except as listed in HPI above).   Objective  Vitals:   09/18/19 1015  BP: 120/90  Pulse: 92  Resp: 16  Temp: (!) 97.1 F (36.2 C)  TempSrc: Temporal  SpO2: 99%  Weight: 160 lb 14.4 oz (73 kg)  Height: 5\' 2"  (1.575 m)    Body mass index is 29.43 kg/m.  Physical Exam  Constitutional: Patient appears well-developed and well-nourished. Overweight. No distress.  HEENT: head atraumatic, normocephalic, pupils equal and reactive to light Cardiovascular: Normal rate, regular rhythm and normal heart sounds.  No murmur heard. No BLE edema. Pulmonary/Chest: Effort normal and breath sounds normal. No respiratory distress. Abdominal: Soft.  There is no tenderness. Psychiatric: Patient has a normal mood and affect. behavior is normal. Judgment and thought content normal. Muscular Skeletal: decrease rom of lumbar spine, pain with extension and lateral rotation, negative straight leg raise, pain during palpation of paraspinal muscles on both sides of lumbar  spine  PHQ2/9: Depression screen [facility] 2/9 09/18/2019 08/29/2019 08/10/2019 07/24/2019 07/23/2019  Decreased Interest 0 0 0 0 0  Down, Depressed, Hopeless  0 0 0 0 0  PHQ - 2 Score 0 0 0 0 0  Altered sleeping 0 0 0 0 0  Tired, decreased energy 0 0 0 0 0  Change in appetite 0 0 0 0 0  Feeling bad or failure about yourself  0 0 0 0 0  Trouble concentrating 0 0 0 0 0  Moving slowly or fidgety/restless 0 0 0 0 0  Suicidal thoughts 0 0 0 0 0  PHQ-9 Score 0 0 0 0 0  Difficult doing work/chores - - Not difficult at all Not difficult at all Not difficult at all  Some recent data might be hidden    phq 9 is negative   Fall Risk: Fall Risk  09/18/2019 08/10/2019 07/24/2019 07/23/2019 06/18/2019  Falls in the past year? 0 0 0 0 0  Number falls in past yr: 0 0 0 0 0  Injury with Fall? 0 0 0 0 0  Follow up - - Falls evaluation completed - -     Functional Status Survey: Is the patient deaf or have difficulty hearing?: No Does the patient have difficulty seeing, even when wearing glasses/contacts?: No Does the patient have difficulty concentrating, remembering, or making decisions?: No Does the patient have difficulty walking or climbing stairs?: No Does the patient have difficulty dressing or bathing?: No Does the patient have difficulty doing errands alone such as visiting a doctor's office or shopping?: No    Assessment & Plan  1. Vaginal bleeding  2. Breakthrough bleeding  - POCT urine pregnancy - Lipid panel - TSH  3. MS (multiple sclerosis) ([place])  Keep follow up with neurologist   4. Other neutropenia (HCC)  - CBC with Differential/Platelet  5. Major depression in remission ([place])   6. Dyslipidemia   7. Insulin resistance  - Hemoglobin A1c  8. Vitamin D deficiency disease  - VITAMIN D 25 Hydroxy (Vit-D Deficiency, Fractures)  9. Long-term use of high-risk medication  - COMPLETE METABOLIC PANEL WITH GFR  10. Chronic bilateral low back pain without sciatica  -  Ambulatory referral to Orthopedic Surgery - meloxicam (MOBIC) 15 MG tablet; Take 1 tablet (15 mg total) by mouth daily.  Dispense: 30 tablet; Refill: 0

## 2019-09-19 ENCOUNTER — Encounter: Payer: Self-pay | Admitting: Family Medicine

## 2019-09-19 DIAGNOSIS — R7989 Other specified abnormal findings of blood chemistry: Secondary | ICD-10-CM | POA: Insufficient documentation

## 2019-09-19 LAB — CBC WITH DIFFERENTIAL/PLATELET
Absolute Monocytes: 307 cells/uL (ref 200–950)
Basophils Absolute: 29 cells/uL (ref 0–200)
Basophils Relative: 1.1 %
Eosinophils Absolute: 10 cells/uL — ABNORMAL LOW (ref 15–500)
Eosinophils Relative: 0.4 %
HCT: 37.1 % (ref 35.0–45.0)
Hemoglobin: 12.1 g/dL (ref 11.7–15.5)
Lymphs Abs: 723 cells/uL — ABNORMAL LOW (ref 850–3900)
MCH: 27.2 pg (ref 27.0–33.0)
MCHC: 32.6 g/dL (ref 32.0–36.0)
MCV: 83.4 fL (ref 80.0–100.0)
MPV: 11.4 fL (ref 7.5–12.5)
Monocytes Relative: 11.8 %
Neutro Abs: 1531 cells/uL (ref 1500–7800)
Neutrophils Relative %: 58.9 %
Platelets: 275 10*3/uL (ref 140–400)
RBC: 4.45 10*6/uL (ref 3.80–5.10)
RDW: 13 % (ref 11.0–15.0)
Total Lymphocyte: 27.8 %
WBC: 2.6 10*3/uL — ABNORMAL LOW (ref 3.8–10.8)

## 2019-09-19 LAB — COMPLETE METABOLIC PANEL WITH GFR
AG Ratio: 1.6 (calc) (ref 1.0–2.5)
ALT: 12 U/L (ref 6–29)
AST: 14 U/L (ref 10–35)
Albumin: 4.4 g/dL (ref 3.6–5.1)
Alkaline phosphatase (APISO): 26 U/L — ABNORMAL LOW (ref 31–125)
BUN: 13 mg/dL (ref 7–25)
CO2: 28 mmol/L (ref 20–32)
Calcium: 9.5 mg/dL (ref 8.6–10.2)
Chloride: 103 mmol/L (ref 98–110)
Creat: 0.68 mg/dL (ref 0.50–1.10)
GFR, Est African American: 121 mL/min/{1.73_m2} (ref >=60)
GFR, Est Non African American: 104 mL/min/{1.73_m2} (ref >=60)
Globulin: 2.7 g/dL (calc) (ref 1.9–3.7)
Glucose, Bld: 91 mg/dL (ref 65–99)
Potassium: 4.7 mmol/L (ref 3.5–5.3)
Sodium: 139 mmol/L (ref 135–146)
Total Bilirubin: 0.5 mg/dL (ref 0.2–1.2)
Total Protein: 7.1 g/dL (ref 6.1–8.1)

## 2019-09-19 LAB — HEMOGLOBIN A1C
Hgb A1c MFr Bld: 5.4 % of total Hgb (ref <5.7)
Mean Plasma Glucose: 108 (calc)
eAG (mmol/L): 6 (calc)

## 2019-09-19 LAB — LIPID PANEL
Cholesterol: 169 mg/dL (ref <200)
HDL: 54 mg/dL (ref >=50)
LDL Cholesterol (Calc): 97 mg/dL (calc)
Non-HDL Cholesterol (Calc): 115 mg/dL (calc) (ref <130)
Total CHOL/HDL Ratio: 3.1 (calc) (ref <5.0)
Triglycerides: 88 mg/dL (ref <150)

## 2019-09-19 LAB — VITAMIN D 25 HYDROXY (VIT D DEFICIENCY, FRACTURES): Vit D, 25-Hydroxy: 35 ng/mL (ref 30–100)

## 2019-09-19 LAB — TSH: TSH: 0.29 mIU/L — ABNORMAL LOW

## 2019-09-20 ENCOUNTER — Telehealth: Payer: Self-pay | Admitting: Family Medicine

## 2019-09-20 NOTE — Telephone Encounter (Signed)
Patient was on antibioticfor sinus infection and developed UTI -Requesting medication for the UTI.  Preferred Pharmacy-[facility] [place], [place].

## 2019-09-21 ENCOUNTER — Other Ambulatory Visit: Payer: Self-pay | Admitting: Family Medicine

## 2019-09-21 DIAGNOSIS — B3731 Acute candidiasis of vulva and vagina: Secondary | ICD-10-CM

## 2019-09-21 DIAGNOSIS — B373 Candidiasis of vulva and vagina: Secondary | ICD-10-CM

## 2019-09-21 MED ORDER — FLUCONAZOLE 150 MG PO TABS: 150.0000 mg | ORAL_TABLET | ORAL | 0 refills | Status: DC

## 2019-09-21 NOTE — Unmapped (Signed)
Patient has enough medication on hand, requested that i reschedule call for 11/6

## 2019-09-21 NOTE — Telephone Encounter (Signed)
She has a yeast infection not a UTI. She has been having itching.

## 2019-09-23 ENCOUNTER — Other Ambulatory Visit: Payer: Self-pay | Admitting: Family Medicine

## 2019-09-23 DIAGNOSIS — M545 Low back pain, unspecified: Secondary | ICD-10-CM

## 2019-09-24 NOTE — Telephone Encounter (Signed)
Requested medication (s) are due for refill today: yes  Requested medication (s) are on the active medication list: yes  Last refill:  08/16/2019  Future visit scheduled: yes  Notes to clinic:  Refill cannot be delegated    Requested Prescriptions  Pending Prescriptions Disp Refills   baclofen (LIORESAL) 10 MG tablet [Pharmacy Med Name: BACLOFEN 10 MG TABLET] 90 tablet 0    Sig: TAKE 1 TABLET BY MOUTH THREE TIMES A DAY AS NEEDED FOR MUSCLE SPASMS     Not Delegated - Analgesics:  Muscle Relaxants Failed - 09/23/2019 10:34 AM      Failed - This refill cannot be delegated      Passed - Valid encounter within last 6 months    Recent Outpatient Visits          6 days ago Breakthrough bleeding   [facility] Steele Sizer, MD   3 weeks ago Acute non-recurrent maxillary sinusitis   [facility] [place], Drue Stager, MD   1 month ago Migraine without aura and without status migrainosus, not intractable   [facility] [place], Drue Stager, MD   2 months ago Bilateral low back pain without sciatica, unspecified chronicity   [facility] Steele Sizer, MD   2 months ago Well adult exam   [facility] Steele Sizer, MD      Future Appointments            In 1 month Ancil Boozer, Drue Stager, MD [facility], [place]   In 2 months  [facility], [place]   In 2 months Steele Sizer, MD [facility], [facility]

## 2019-09-26 LAB — CERVICOVAGINAL ANCILLARY ONLY
Bacterial Vaginitis (gardnerella): NEGATIVE
Candida Glabrata: NEGATIVE
Candida Vaginitis: POSITIVE — AB
Chlamydia: NEGATIVE
Comment: NEGATIVE
Comment: NEGATIVE
Comment: NEGATIVE
Comment: NEGATIVE
Comment: NEGATIVE
Comment: NORMAL
Neisseria Gonorrhea: NEGATIVE
Trichomonas: NEGATIVE

## 2019-09-28 ENCOUNTER — Telehealth: Payer: Self-pay | Admitting: Gastroenterology

## 2019-09-28 NOTE — Telephone Encounter (Signed)
Patient called & would like to schedule a colonoscopy. °

## 2019-10-01 NOTE — Telephone Encounter (Signed)
Returned patients call to schedule screening colonoscopy.  During triaging she mentioned that she is bothered with acid reflux takes omeprazole for this but feels like food is getting stuck.   An office visit has been scheduled for her to come in to discuss this issue and schedule her colonoscopy.  Thanks [employer]

## 2019-10-08 ENCOUNTER — Other Ambulatory Visit: Payer: Self-pay | Admitting: Family Medicine

## 2019-10-08 DIAGNOSIS — M545 Low back pain, unspecified: Secondary | ICD-10-CM

## 2019-10-08 NOTE — Telephone Encounter (Signed)
Requested medication (s) are due for refill today: yes  Requested medication (s) are on the active medication list: yes  Last refill: 09/23/2019  Future visit scheduled: yes   Notes to clinic:  Requesting 90 day supply   Requested Prescriptions  Pending Prescriptions Disp Refills   baclofen (LIORESAL) 10 MG tablet [Pharmacy Med Name: BACLOFEN 10 MG TABLET] 270 tablet 1    Sig: TAKE 1 TABLET BY MOUTH THREE TIMES A DAY AS NEEDED FOR MUSCLE SPASMS     Not Delegated - Analgesics:  Muscle Relaxants Failed - 10/08/2019  2:19 PM      Failed - This refill cannot be delegated      Passed - Valid encounter within last 6 months    Recent Outpatient Visits          2 weeks ago Breakthrough bleeding   [facility] Steele Sizer, MD   1 month ago Acute non-recurrent maxillary sinusitis   [facility] [place], Drue Stager, MD   1 month ago Migraine without aura and without status migrainosus, not intractable   [facility] [place], Drue Stager, MD   2 months ago Bilateral low back pain without sciatica, unspecified chronicity   [facility] Steele Sizer, MD   2 months ago Well adult exam   [facility] Steele Sizer, MD      Future Appointments            In 1 month Ancil Boozer, Drue Stager, MD [facility], [place]   In 1 month  [facility], [place]   In 2 months Steele Sizer, MD [facility], [facility]

## 2019-10-10 ENCOUNTER — Other Ambulatory Visit: Payer: Self-pay | Admitting: Family Medicine

## 2019-10-10 DIAGNOSIS — G4709 Other insomnia: Secondary | ICD-10-CM

## 2019-10-10 DIAGNOSIS — G35 Multiple sclerosis: Secondary | ICD-10-CM | POA: Diagnosis not present

## 2019-10-10 DIAGNOSIS — M545 Low back pain: Secondary | ICD-10-CM | POA: Diagnosis not present

## 2019-10-10 DIAGNOSIS — G8929 Other chronic pain: Secondary | ICD-10-CM | POA: Diagnosis not present

## 2019-10-10 MED ORDER — CYCLOBENZAPRINE 10 MG TABLET
ORAL | 0 days
Start: 2019-10-10 — End: ?

## 2019-10-10 NOTE — Telephone Encounter (Signed)
Requested medication (s) are due for refill today: yes  Requested medication (s) are on the active medication list: yes  Last refill:  09/08/2019  Future visit scheduled: yes  Notes to clinic:  Patient requesting a 90 day with 1 refill    Requested Prescriptions  Pending Prescriptions Disp Refills   traZODone (DESYREL) 50 MG tablet [Pharmacy Med Name: TRAZODONE 50 MG TABLET] 90 tablet 1    Sig: TAKE 0.5-1 TABLETS (25-50 MG TOTAL) BY MOUTH AT BEDTIME AS NEEDED FOR SLEEP.     Psychiatry: Antidepressants - Serotonin Modulator Passed - 10/10/2019 12:33 PM      Passed - Valid encounter within last 6 months    Recent Outpatient Visits          3 weeks ago Breakthrough bleeding   [facility] Steele Sizer, MD   1 month ago Acute non-recurrent maxillary sinusitis   [facility] [place], Drue Stager, MD   2 months ago Migraine without aura and without status migrainosus, not intractable   [facility], Drue Stager, MD   2 months ago Bilateral low back pain without sciatica, unspecified chronicity   [facility] Steele Sizer, MD   2 months ago Well adult exam   [facility] Steele Sizer, MD      Future Appointments            In 1 month Steele Sizer, MD [facility], [place]   In 1 month  [facility]   In 2 months Steele Sizer, MD [place] - Completed PHQ-2 or PHQ-9 in the last 360 days.

## 2019-10-15 NOTE — Unmapped (Signed)
[facility] Clinical Assessment & Refill Coordination Note    Hannah Barr, DOB: Jul 03, 1971  Phone: [phone] (home)     All above HIPAA information was verified with patient.     Specialty Medication(s):   Neurology: Tecfidera     Current Outpatient Medications   Medication Sig Dispense Refill   ??? cholecalciferol, vitamin D3, (VITAMIN D3) 4,000 unit cap Take 4,000 Units by mouth daily. 30 capsule 11   ??? dimethyl fumarate 240 mg CpDR Take 1 capsule (240mg ) by mouth twice daily with meals. 60 capsule 5   ??? linaclotide (LINZESS) 145 mcg capsule Take 145 mcg by mouth daily as needed (constipation).      ??? mesalamine (CANASA) 1000 MG suppository Insert 1,000 mg into the rectum nightly.     ??? SUMAtriptan (IMITREX) 100 MG tablet Take 100 mg by mouth every two (2) hours as needed.      ??? traZODone (DESYREL) 50 MG tablet TAKE 0.5-1 TABLETS (25-50 MG TOTAL) BY MOUTH AT BEDTIME AS NEEDED FOR SLEEP.  3     No current facility-administered medications for this visit.         Changes to medications: Hannah Barr reports no changes at this time.    Allergies   Allergen Reactions   ??? Penicillins Hives       Changes to allergies: No    SPECIALTY MEDICATION ADHERENCE     Tecfidera 240 mg: 5 days of medicine on hand       Medication Adherence    Patient reported X missed doses in the last month: 0  Specialty Medication: Tecfidera  Patient is on additional specialty medications: No  Informant: patient          Specialty medication(s) dose(s) confirmed: Regimen is correct and unchanged.     Are there any concerns with adherence? No    Adherence counseling provided? Not needed    CLINICAL MANAGEMENT AND INTERVENTION      Clinical Benefit Assessment:    Do you feel the medicine is effective or helping your condition? Yes    Clinical Benefit counseling provided? Not needed    Adverse Effects Assessment:    Are you experiencing any side effects? No Are you experiencing difficulty administering your medicine? No    Quality of Life Assessment:    How many days over the past month did your MS  keep you from your normal activities? For example, brushing your teeth or getting up in the morning. Patient unable to quantify but states she is fatigued just about every day    Have you discussed this with your provider? Yes    Therapy Appropriateness:    Is therapy appropriate? Yes, therapy is appropriate and should be continued    DISEASE/MEDICATION-SPECIFIC INFORMATION      N/A    PATIENT SPECIFIC NEEDS     ? Does the patient have any physical, cognitive, or cultural barriers? No    ? Is the patient high risk? No     ? Does the patient require a Care Management Plan? No     ? Does the patient require physician intervention or other additional services (i.e. nutrition, smoking cessation, social work)? No      SHIPPING     Specialty Medication(s) to be Shipped:   Neurology: Tecfidera    Other medication(s) to be shipped: none     Changes to insurance: No    Delivery Scheduled: Yes, Expected medication delivery date: 10/17/19.  Medication will be delivered via Next Day Courier to the confirmed prescription address in [facility].    The patient will receive a drug information handout for each medication shipped and additional FDA Medication Guides as required.  Verified that patient has previously received a Conservation officer, historic buildings.    All of the patient's questions and concerns have been addressed.    Arnold Long   [facility] Specialty Pharmacist

## 2019-10-15 NOTE — Unmapped (Signed)
[facility] Clinical Assessment & Refill Coordination Note  Medication: Tecfidera    Unable to reach patient to schedule shipment for medication being filled at [facility]. Left voicemail on phone.  As this is the 3rd unsuccessful attempt to reach the patient, no additional phone call attempts will be made at this time.      Phone numbers attempted: [phone]  Last scheduled delivery: 08/29/19    Please call the [facility] at [phone] (option [phone]) should you have any further questions.      Thanks,  [facility] Specialty Team

## 2019-10-16 MED FILL — TECFIDERA 240 MG CAPSULE,DELAYED RELEASE: 30 days supply | Qty: 60 | Fill #2 | Status: AC

## 2019-10-16 MED FILL — TECFIDERA 240 MG CAPSULE,DELAYED RELEASE: ORAL | 30 days supply | Qty: 60 | Fill #2

## 2019-11-06 ENCOUNTER — Other Ambulatory Visit: Payer: Self-pay

## 2019-11-06 ENCOUNTER — Encounter: Payer: Self-pay | Admitting: Family Medicine

## 2019-11-06 ENCOUNTER — Ambulatory Visit (INDEPENDENT_AMBULATORY_CARE_PROVIDER_SITE_OTHER): Payer: Medicare HMO | Admitting: Family Medicine

## 2019-11-06 DIAGNOSIS — J3489 Other specified disorders of nose and nasal sinuses: Secondary | ICD-10-CM

## 2019-11-06 DIAGNOSIS — R05 Cough: Secondary | ICD-10-CM | POA: Diagnosis not present

## 2019-11-06 DIAGNOSIS — Z20822 Contact with and (suspected) exposure to covid-19: Secondary | ICD-10-CM

## 2019-11-06 DIAGNOSIS — Z20828 Contact with and (suspected) exposure to other viral communicable diseases: Secondary | ICD-10-CM | POA: Diagnosis not present

## 2019-11-06 DIAGNOSIS — R058 Other specified cough: Secondary | ICD-10-CM

## 2019-11-06 DIAGNOSIS — R110 Nausea: Secondary | ICD-10-CM | POA: Diagnosis not present

## 2019-11-06 MED ORDER — PROMETHAZINE-CODEINE 6.25-10 MG/5ML PO SOLN: 15.0000 mL | Freq: Every day | ORAL | 0 refills | Status: DC

## 2019-11-06 MED ORDER — ONDANSETRON HCL 4 MG PO TABS: 4.0000 mg | ORAL_TABLET | Freq: Three times a day (TID) | ORAL | 0 refills | Status: DC | PRN

## 2019-11-06 MED ORDER — AZITHROMYCIN 250 MG PO TABS: ORAL_TABLET | ORAL | 0 refills | Status: DC

## 2019-11-06 NOTE — Progress Notes (Signed)
Name: Tammie Lopez   MRN: [id number]    DOB: 06/22/1971   Date:11/06/2019       Progress Note  Subjective  Chief Complaint  Chief Complaint  Patient presents with  . Cough    Symptoms started Novemeber 27. Cough and sinus pressure have resolved. She has been isolated for 10 days. Her brother tested positive the week after. She thinks she might have been exposed. She is only having sinus drainage which is causing her to feel sick on her stomach. Denies any other symptoms.  . Sinusitis    I connected with  ESMER ECHARD  on 11/06/19 at  8:00 AM EST by a video enabled telemedicine application and verified that I am speaking with the correct person using two identifiers.  I discussed the limitations of evaluation and management by telemedicine and the availability of in person appointments. The patient expressed understanding and agreed to proceed. Staff also discussed with the patient that there may be a patient responsible charge related to this service. Patient Location: home  Provider Location: [facility]   HPI  Cough: she states symptoms started with fever, diarrhea , cough, lack of appetite  and nasal congestion, she did not get tested for COVID-19, she self quarantine for 10 days. She states fever, diarrhea and lack of appetite  resolved within 24 hours, she sates she states  felt a little better, but having productive cough and over the past few days a lot of sinus pressure , post-nasal drainage, makes her spit up constant and feels nauseated. She has chronic fatigue ( unchanged ) no body aches. No rashes. She states brother tested positive on Nov 29th, she was around him 3 days prior.   Patient Active Problem List   Diagnosis Date Noted  . Low TSH level 09/19/2019  . Obesity (BMI 30-39.9) 12/06/2017  . Insulin resistance 09/27/2017  . Essential hypertension 09/21/2017  . Right sided sciatica 09/21/2017  . Major depression, recurrent ([place]) 05/24/2017  .  Migraine without aura, not intractable 12/09/2016  . Nickel allergy 12/09/2016  . GERD without esophagitis 12/09/2016  . Other neutropenia ([place]) 12/09/2016  . Dyslipidemia 12/09/2016  . Diverticulosis 12/09/2016  . 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 Allergic rhinitis, seasonal 12/09/2016  . Chronic constipation 12/09/2016  . Vitamin D deficiency 08/06/2013  . MS (multiple sclerosis) ([place]) 04/17/2013    Social History   Tobacco Use  . Smoking status: Never Smoker  . Smokeless tobacco: Never Used  . Tobacco comment: smoking cessation materials not required  Substance Use Topics  . Alcohol use: No     Current Outpatient Medications:  .  amitriptyline (ELAVIL) 25 MG tablet, Take 1 tablet by mouth as needed for sleep., Disp: , Rfl: 2 .  celecoxib (CELEBREX) 200 MG capsule, Take by mouth., Disp: , Rfl:  .  Cholecalciferol 100 MCG (4000 UT) CAPS, Take by mouth., Disp: , Rfl:  .  cyclobenzaprine (FLEXERIL) 10 MG tablet, Take by mouth., Disp: , Rfl:  .  diclofenac sodium (VOLTAREN) 1 % GEL, APPLY 4 G TOPICALLY 4 (FOUR) TIMES DAILY., Disp: 300 g, Rfl: 0 .  Dimethyl Fumarate (TECFIDERA) 240 MG CPDR, Take 1 capsule (240 mg total) by mouth 2 (two) times daily., Disp: 60 capsule, Rfl: 0 .  Erenumab-aooe (AIMOVIG) 140 MG/ML SOAJ, Inject 140 mg into the skin every 30 (thirty) days., Disp: 1 mL, Rfl: 5 .  fluticasone (FLONASE) 50 MCG/ACT nasal spray, Place 2 sprays into both nostrils daily., Disp: 16 g,  Rfl: 0 .  linaclotide (LINZESS) 290 MCG CAPS capsule, Take 1 capsule (290 mcg total) by mouth daily., Disp: 30 capsule, Rfl: 2 .  Multiple Vitamin (MULTIVITAMIN) tablet, Take 1 tablet by mouth daily., Disp: , Rfl:  .  norgestimate-ethinyl estradiol (SPRINTEC 28) 0.25-35 MG-MCG tablet, Take 1 tablet by mouth daily., Disp: 1 Package, Rfl: 11 .  omeprazole (PRILOSEC) 40 MG capsule, TAKE 1 CAPSULE BY MOUTH EVERY DAY, Disp: 90 capsule, Rfl: 0 .  ondansetron (ZOFRAN) 4 MG tablet, Take 1 tablet (4 mg total) by  mouth every 8 (eight) hours as needed for nausea or vomiting., Disp: 12 tablet, Rfl: 0 .  SUMAtriptan (IMITREX) 100 MG tablet, TAKE 1 TABLET (100 MG TOTAL) BY MOUTH EVERY 2 (TWO) HOURS AS NEEDED FOR MIGRAINE. MAY REPEAT IN 2 HOURS IF HEADACHE PERSISTS OR RECURS., Disp: 9 tablet, Rfl: 1 .  traZODone (DESYREL) 50 MG tablet, TAKE 0.5-1 TABLETS (25-50 MG TOTAL) BY MOUTH AT BEDTIME AS NEEDED FOR SLEEP., Disp: 30 tablet, Rfl: 2  Allergies  Allergen Reactions  . Penicillins Hives    I personally reviewed active problem list, medication list, allergies, family history, social history with the patient/caregiver today.  ROS  Ten systems reviewed and is negative except as mentioned in HPI   Objective  Virtual encounter, vitals not obtained.  There is no height or weight on file to calculate BMI.  Nursing Note and Vital Signs reviewed.  Physical Exam  Awake, alert and oriented   Assessment & Plan  1. Productive cough  - azithromycin (ZITHROMAX) 250 MG tablet; Take as directed  Dispense: 6 tablet; Refill: 0 - Promethazine-Codeine 6.25-10 MG/5ML SOLN; Take 15 mLs by mouth at bedtime.  Dispense: 280 mL; Refill: 0  2. Sinus pressure  - azithromycin (ZITHROMAX) 250 MG tablet; Take as directed  Dispense: 6 tablet; Refill: 0  3. Exposure to COVID-19 virus  Since she has been home for 10 days, and her family members have already been tested we will keep her at home for the next 4 days or until symptoms improves, she prefers not getting tested at this time, stay hydrated, medication for symptoms and because of productive cough we will send a Zpack   4. Nausea  - ondansetron (ZOFRAN) 4 MG tablet; Take 1 tablet (4 mg total) by mouth every 8 (eight) hours as needed for nausea or vomiting.  Dispense: 20 tablet; Refill: 0    -Red flags and when to present for emergency care or RTC including fever >101.24F, chest pain, shortness of breath, new/worsening/un-resolving symptoms,  reviewed with  patient at time of visit. Follow up and care instructions discussed and provided in AVS. - I discussed the assessment and treatment plan with the patient. The patient was provided an opportunity to ask questions and all were answered. The patient agreed with the plan and demonstrated an understanding of the instructions.  I provided 15 minutes of non-face-to-face time during this encounter.  [name], MD

## 2019-11-12 ENCOUNTER — Ambulatory Visit: Payer: Medicare HMO | Admitting: Family Medicine

## 2019-11-15 ENCOUNTER — Ambulatory Visit: Payer: Medicare HMO | Admitting: Gastroenterology

## 2019-11-19 ENCOUNTER — Other Ambulatory Visit: Payer: Self-pay | Admitting: Family Medicine

## 2019-11-19 DIAGNOSIS — G4709 Other insomnia: Secondary | ICD-10-CM

## 2019-11-19 NOTE — Unmapped (Signed)
[facility] Refill Coordination Note    Specialty Medication(s) to be Shipped:   Neurology: Tecfidera    Other medication(s) to be shipped: n/a     Hannah Barr, DOB: Nov 16, 1971  Phone: [phone] (home)       All above HIPAA information was verified with patient.     Was a Nurse, learning disability used for this call? No    Completed refill call assessment today to schedule patient's medication shipment from the [facility] [phone]).       Specialty medication(s) and dose(s) confirmed: Regimen is correct and unchanged.   Changes to medications: Hannah Barr reports no changes at this time.  Changes to insurance: No  Questions for the pharmacist: No    Confirmed patient received Welcome Packet with first shipment. The patient will receive a drug information handout for each medication shipped and additional FDA Medication Guides as required.       DISEASE/MEDICATION-SPECIFIC INFORMATION        N/A    SPECIALTY MEDICATION ADHERENCE     Medication Adherence    Patient reported X missed doses in the last month: 0  Specialty Medication: tecfidera  Patient is on additional specialty medications: No  Patient is on more than two specialty medications: No  Any gaps in refill history greater than 2 weeks in the last 3 months: no  Demonstrates understanding of importance of adherence: yes  Informant: patient                Tecfidera 240mg : Patient has 3 days of medication on hand      SHIPPING     Shipping address confirmed in Epic.     Delivery Scheduled: Yes, Expected medication delivery date: 11/21/19.     Medication will be delivered via Same Day Courier to the prescription address in Epic WAM.    [name]   [facility] Specialty Technician

## 2019-11-21 MED FILL — TECFIDERA 240 MG CAPSULE,DELAYED RELEASE: 30 days supply | Qty: 60 | Fill #3 | Status: AC

## 2019-11-21 MED FILL — TECFIDERA 240 MG CAPSULE,DELAYED RELEASE: ORAL | 30 days supply | Qty: 60 | Fill #3

## 2019-11-27 ENCOUNTER — Ambulatory Visit: Payer: Medicare HMO

## 2019-11-27 ENCOUNTER — Other Ambulatory Visit: Payer: Self-pay | Admitting: Family Medicine

## 2019-11-27 DIAGNOSIS — K219 Gastro-esophageal reflux disease without esophagitis: Secondary | ICD-10-CM

## 2019-12-02 ENCOUNTER — Other Ambulatory Visit: Payer: Self-pay | Admitting: Family Medicine

## 2019-12-02 DIAGNOSIS — E8881 Metabolic syndrome: Secondary | ICD-10-CM

## 2019-12-06 DIAGNOSIS — G35 Multiple sclerosis: Secondary | ICD-10-CM | POA: Diagnosis not present

## 2019-12-07 DIAGNOSIS — G35 Multiple sclerosis: Secondary | ICD-10-CM | POA: Diagnosis not present

## 2019-12-08 DIAGNOSIS — G35 Multiple sclerosis: Secondary | ICD-10-CM | POA: Diagnosis not present

## 2019-12-09 DIAGNOSIS — G35 Multiple sclerosis: Secondary | ICD-10-CM | POA: Diagnosis not present

## 2019-12-10 DIAGNOSIS — G35 Multiple sclerosis: Secondary | ICD-10-CM | POA: Diagnosis not present

## 2019-12-11 DIAGNOSIS — G35 Multiple sclerosis: Secondary | ICD-10-CM | POA: Diagnosis not present

## 2019-12-12 DIAGNOSIS — G35 Multiple sclerosis: Secondary | ICD-10-CM | POA: Diagnosis not present

## 2019-12-12 NOTE — Unmapped (Signed)
[facility] Refill Coordination Note    Specialty Medication(s) to be Shipped:   Neurology: Tecfidera    Other medication(s) to be shipped: n/a     Hannah Barr, DOB: Nov 12, 1971  Phone: [phone] (home)       All above HIPAA information was verified with patient.     Was a Nurse, learning disability used for this call? No    Completed refill call assessment today to schedule patient's medication shipment from the [facility] [phone]).       Specialty medication(s) and dose(s) confirmed: Regimen is correct and unchanged.   Changes to medications: Hannah Barr reports no changes at this time.  Changes to insurance: No  Questions for the pharmacist: No    Confirmed patient received Welcome Packet with first shipment. The patient will receive a drug information handout for each medication shipped and additional FDA Medication Guides as required.       DISEASE/MEDICATION-SPECIFIC INFORMATION        N/A    SPECIALTY MEDICATION ADHERENCE     Medication Adherence    Patient reported X missed doses in the last month: 0  Specialty Medication: Tecfidera  Patient is on additional specialty medications: No  Patient is on more than two specialty medications: No  Any gaps in refill history greater than 2 weeks in the last 3 months: no  Demonstrates understanding of importance of adherence: yes  Informant: patient                Tecfidera 240mg : Patient has 14 days of medication on hand      SHIPPING     Shipping address confirmed in Epic.     Delivery Scheduled: Yes, Expected medication delivery date: 12/25/19.     Medication will be delivered via Next Day Courier to the prescription address in Epic WAM.    Hannah Barr   [facility] Specialty Technician

## 2019-12-13 DIAGNOSIS — G35 Multiple sclerosis: Secondary | ICD-10-CM | POA: Diagnosis not present

## 2019-12-14 DIAGNOSIS — G35 Multiple sclerosis: Secondary | ICD-10-CM | POA: Diagnosis not present

## 2019-12-15 DIAGNOSIS — G35 Multiple sclerosis: Secondary | ICD-10-CM | POA: Diagnosis not present

## 2019-12-16 DIAGNOSIS — G35 Multiple sclerosis: Secondary | ICD-10-CM | POA: Diagnosis not present

## 2019-12-17 DIAGNOSIS — G35 Multiple sclerosis: Secondary | ICD-10-CM | POA: Diagnosis not present

## 2019-12-18 DIAGNOSIS — G35 Multiple sclerosis: Secondary | ICD-10-CM | POA: Diagnosis not present

## 2019-12-19 ENCOUNTER — Ambulatory Visit: Payer: Medicare HMO | Admitting: Family Medicine

## 2019-12-19 DIAGNOSIS — G35 Multiple sclerosis: Secondary | ICD-10-CM | POA: Diagnosis not present

## 2019-12-20 DIAGNOSIS — G35 Multiple sclerosis: Secondary | ICD-10-CM | POA: Diagnosis not present

## 2019-12-21 DIAGNOSIS — G309 Alzheimer's disease, unspecified: Secondary | ICD-10-CM | POA: Diagnosis not present

## 2019-12-21 DIAGNOSIS — G35 Multiple sclerosis: Secondary | ICD-10-CM | POA: Diagnosis not present

## 2019-12-22 DIAGNOSIS — G35 Multiple sclerosis: Secondary | ICD-10-CM | POA: Diagnosis not present

## 2019-12-22 DIAGNOSIS — G309 Alzheimer's disease, unspecified: Secondary | ICD-10-CM | POA: Diagnosis not present

## 2019-12-23 DIAGNOSIS — G35 Multiple sclerosis: Secondary | ICD-10-CM | POA: Diagnosis not present

## 2019-12-23 DIAGNOSIS — G309 Alzheimer's disease, unspecified: Secondary | ICD-10-CM | POA: Diagnosis not present

## 2019-12-24 DIAGNOSIS — G35 Multiple sclerosis: Secondary | ICD-10-CM | POA: Diagnosis not present

## 2019-12-24 MED FILL — TECFIDERA 240 MG CAPSULE,DELAYED RELEASE: 30 days supply | Qty: 60 | Fill #4 | Status: AC

## 2019-12-24 MED FILL — TECFIDERA 240 MG CAPSULE,DELAYED RELEASE: ORAL | 30 days supply | Qty: 60 | Fill #4

## 2019-12-25 DIAGNOSIS — G35 Multiple sclerosis: Secondary | ICD-10-CM | POA: Diagnosis not present

## 2019-12-26 DIAGNOSIS — G35 Multiple sclerosis: Secondary | ICD-10-CM | POA: Diagnosis not present

## 2019-12-27 DIAGNOSIS — G35 Multiple sclerosis: Secondary | ICD-10-CM | POA: Diagnosis not present

## 2019-12-28 DIAGNOSIS — G35 Multiple sclerosis: Secondary | ICD-10-CM | POA: Diagnosis not present

## 2019-12-29 DIAGNOSIS — G35 Multiple sclerosis: Secondary | ICD-10-CM | POA: Diagnosis not present

## 2019-12-30 DIAGNOSIS — G35 Multiple sclerosis: Secondary | ICD-10-CM | POA: Diagnosis not present

## 2019-12-31 DIAGNOSIS — G35 Multiple sclerosis: Secondary | ICD-10-CM | POA: Diagnosis not present

## 2020-01-01 DIAGNOSIS — G35 Multiple sclerosis: Secondary | ICD-10-CM | POA: Diagnosis not present

## 2020-01-02 DIAGNOSIS — G35 Multiple sclerosis: Secondary | ICD-10-CM | POA: Diagnosis not present

## 2020-01-03 DIAGNOSIS — G35 Multiple sclerosis: Secondary | ICD-10-CM | POA: Diagnosis not present

## 2020-01-04 DIAGNOSIS — G35 Multiple sclerosis: Secondary | ICD-10-CM | POA: Diagnosis not present

## 2020-01-05 DIAGNOSIS — G35 Multiple sclerosis: Secondary | ICD-10-CM | POA: Diagnosis not present

## 2020-01-06 DIAGNOSIS — G35 Multiple sclerosis: Secondary | ICD-10-CM | POA: Diagnosis not present

## 2020-01-07 DIAGNOSIS — G35 Multiple sclerosis: Secondary | ICD-10-CM | POA: Diagnosis not present

## 2020-01-08 DIAGNOSIS — G35 Multiple sclerosis: Secondary | ICD-10-CM | POA: Diagnosis not present

## 2020-01-09 DIAGNOSIS — G35 Multiple sclerosis: Secondary | ICD-10-CM | POA: Diagnosis not present

## 2020-01-09 NOTE — Unmapped (Signed)
-----   Message from Annye Asa, CNA sent at 01/09/2020  2:37 PM EST -----  Regarding: RE: past due for follow up.  LM, recall entered  ----- Message -----  From: [name], PA  Sent: 01/09/2020   1:09 PM EST  To: Annye Asa, CNA  Subject: past due for follow up.                          Hannah Barr,  Can you please schedule a video / phone visit for 60 minutes?    Thank you, Hannah Barr

## 2020-01-10 DIAGNOSIS — G35 Multiple sclerosis: Secondary | ICD-10-CM | POA: Diagnosis not present

## 2020-01-10 NOTE — Unmapped (Signed)
-----   Message from Annye Asa, CNA sent at 01/10/2020 11:55 AM EST -----  Regarding: RE: past due for follow up.  LM  ----- Message -----  From: [name], PA  Sent: 01/09/2020   1:09 PM EST  To: Annye Asa, CNA  Subject: past due for follow up.                          Elease Hashimoto,  Can you please schedule a video / phone visit for 60 minutes?    Thank you, Teressa Mcglocklin

## 2020-01-11 DIAGNOSIS — G35 Multiple sclerosis: Secondary | ICD-10-CM | POA: Diagnosis not present

## 2020-01-11 NOTE — Unmapped (Signed)
-----   Message from Annye Asa, CNA sent at 01/11/2020 11:16 AM EST -----  Regarding: RE: past due for follow up.  Msg 2 left  ----- Message -----  From: [name], PA  Sent: 01/09/2020   1:09 PM EST  To: Annye Asa, CNA  Subject: past due for follow up.                          Elease Hashimoto,  Can you please schedule a video / phone visit for 60 minutes?    Thank you, Sam Wunschel

## 2020-01-12 DIAGNOSIS — G35 Multiple sclerosis: Secondary | ICD-10-CM | POA: Diagnosis not present

## 2020-01-13 DIAGNOSIS — G35 Multiple sclerosis: Secondary | ICD-10-CM | POA: Diagnosis not present

## 2020-01-14 DIAGNOSIS — G35 Multiple sclerosis: Secondary | ICD-10-CM | POA: Diagnosis not present

## 2020-01-14 NOTE — Unmapped (Signed)
-----   Message from Annye Asa, CNA sent at 01/14/2020 11:08 AM EST -----  Regarding: RE: past due for follow up.  Msg#3, no contact made  ----- Message -----  From: [name], PA  Sent: 01/09/2020   1:09 PM EST  To: Annye Asa, CNA  Subject: past due for follow up.                          Elease Hashimoto,  Can you please schedule a video / phone visit for 60 minutes?    Thank you, Betsy Rosello

## 2020-01-15 DIAGNOSIS — G35 Multiple sclerosis: Secondary | ICD-10-CM | POA: Diagnosis not present

## 2020-01-16 DIAGNOSIS — G35 Multiple sclerosis: Secondary | ICD-10-CM | POA: Diagnosis not present

## 2020-01-16 NOTE — Unmapped (Signed)
[facility] Refill Coordination Note    Specialty Medication(s) to be Shipped:   Neurology: Tecfidera    Other medication(s) to be shipped: n/a     Hannah Barr, DOB: [birth year]  Phone: [phone] (home)       All above HIPAA information was verified with patient.     Was a Nurse, learning disability used for this call? No    Completed refill call assessment today to schedule patient's medication shipment from the [facility] [phone]).       Specialty medication(s) and dose(s) confirmed: Regimen is correct and unchanged.   Changes to medications: Hannah Barr reports no changes at this time.  Changes to insurance: No  Questions for the pharmacist: No    Confirmed patient received Welcome Packet with first shipment. The patient will receive a drug information handout for each medication shipped and additional FDA Medication Guides as required.       DISEASE/MEDICATION-SPECIFIC INFORMATION        N/A    SPECIALTY MEDICATION ADHERENCE     Medication Adherence    Patient reported X missed doses in the last month: 0  Specialty Medication: Tecfidera  Patient is on additional specialty medications: No  Patient is on more than two specialty medications: No  Any gaps in refill history greater than 2 weeks in the last 3 months: no  Demonstrates understanding of importance of adherence: yes  Informant: patient                Tecfidera 240mg : Patient has 14 days of medication on hand       SHIPPING     Shipping address confirmed in Epic.     Delivery Scheduled: Yes, Expected medication delivery date: 2/25.     Medication will be delivered via Next Day Courier to the prescription address in Epic WAM.    Hannah Barr   [facility] Specialty Technician

## 2020-01-17 ENCOUNTER — Ambulatory Visit: Payer: Medicare HMO | Admitting: Gastroenterology

## 2020-01-17 DIAGNOSIS — G35 Multiple sclerosis: Secondary | ICD-10-CM | POA: Diagnosis not present

## 2020-01-18 DIAGNOSIS — G35 Multiple sclerosis: Secondary | ICD-10-CM | POA: Diagnosis not present

## 2020-01-19 DIAGNOSIS — G35 Multiple sclerosis: Secondary | ICD-10-CM | POA: Diagnosis not present

## 2020-01-20 DIAGNOSIS — G35 Multiple sclerosis: Secondary | ICD-10-CM | POA: Diagnosis not present

## 2020-01-21 DIAGNOSIS — G35 Multiple sclerosis: Secondary | ICD-10-CM | POA: Diagnosis not present

## 2020-01-22 DIAGNOSIS — G35 Multiple sclerosis: Secondary | ICD-10-CM | POA: Diagnosis not present

## 2020-01-23 DIAGNOSIS — G35 Multiple sclerosis: Secondary | ICD-10-CM | POA: Diagnosis not present

## 2020-01-23 MED FILL — TECFIDERA 240 MG CAPSULE,DELAYED RELEASE: 30 days supply | Qty: 60 | Fill #5 | Status: AC

## 2020-01-23 MED FILL — TECFIDERA 240 MG CAPSULE,DELAYED RELEASE: ORAL | 30 days supply | Qty: 60 | Fill #5

## 2020-01-24 DIAGNOSIS — G35 Multiple sclerosis: Secondary | ICD-10-CM | POA: Diagnosis not present

## 2020-01-25 DIAGNOSIS — G35 Multiple sclerosis: Secondary | ICD-10-CM | POA: Diagnosis not present

## 2020-01-26 DIAGNOSIS — G35 Multiple sclerosis: Secondary | ICD-10-CM | POA: Diagnosis not present

## 2020-01-27 DIAGNOSIS — G35 Multiple sclerosis: Secondary | ICD-10-CM | POA: Diagnosis not present

## 2020-01-28 DIAGNOSIS — G35 Multiple sclerosis: Secondary | ICD-10-CM | POA: Diagnosis not present

## 2020-01-29 DIAGNOSIS — G35 Multiple sclerosis: Secondary | ICD-10-CM | POA: Diagnosis not present

## 2020-01-30 DIAGNOSIS — G35 Multiple sclerosis: Secondary | ICD-10-CM | POA: Diagnosis not present

## 2020-01-31 DIAGNOSIS — G35 Multiple sclerosis: Secondary | ICD-10-CM | POA: Diagnosis not present

## 2020-02-01 DIAGNOSIS — G35 Multiple sclerosis: Secondary | ICD-10-CM | POA: Diagnosis not present

## 2020-02-02 DIAGNOSIS — G35 Multiple sclerosis: Secondary | ICD-10-CM | POA: Diagnosis not present

## 2020-02-03 DIAGNOSIS — G35 Multiple sclerosis: Secondary | ICD-10-CM | POA: Diagnosis not present

## 2020-02-04 DIAGNOSIS — G35 Multiple sclerosis: Secondary | ICD-10-CM | POA: Diagnosis not present

## 2020-02-05 DIAGNOSIS — G35 Multiple sclerosis: Secondary | ICD-10-CM | POA: Diagnosis not present

## 2020-02-06 DIAGNOSIS — G35 Multiple sclerosis: Secondary | ICD-10-CM | POA: Diagnosis not present

## 2020-02-07 DIAGNOSIS — G35 Multiple sclerosis: Secondary | ICD-10-CM | POA: Diagnosis not present

## 2020-02-08 DIAGNOSIS — G35 Multiple sclerosis: Secondary | ICD-10-CM | POA: Diagnosis not present

## 2020-02-09 DIAGNOSIS — G35 Multiple sclerosis: Secondary | ICD-10-CM | POA: Diagnosis not present

## 2020-02-10 DIAGNOSIS — G35 Multiple sclerosis: Secondary | ICD-10-CM | POA: Diagnosis not present

## 2020-02-11 DIAGNOSIS — G35 Multiple sclerosis: Secondary | ICD-10-CM | POA: Diagnosis not present

## 2020-02-12 DIAGNOSIS — G35 Multiple sclerosis: Secondary | ICD-10-CM | POA: Diagnosis not present

## 2020-02-13 DIAGNOSIS — G35 Multiple sclerosis: Secondary | ICD-10-CM | POA: Diagnosis not present

## 2020-02-14 DIAGNOSIS — G35 Multiple sclerosis: Secondary | ICD-10-CM | POA: Diagnosis not present

## 2020-02-14 NOTE — Unmapped (Signed)
[facility] Refill Coordination Note    Specialty Medication(s) to be Shipped:   Neurology: Tecfidera    Other medication(s) to be shipped: n/a     Hannah Barr, DOB: 1971-06-27  Phone: [phone] (home)       All above HIPAA information was verified with patient.     Was a Nurse, learning disability used for this call? No    Completed refill call assessment today to schedule patient's medication shipment from the [facility] [phone]).       Specialty medication(s) and dose(s) confirmed: Regimen is correct and unchanged.   Changes to medications: Hannah Barr reports no changes at this time.  Changes to insurance: No  Questions for the pharmacist: No    Confirmed patient received Welcome Packet with first shipment. The patient will receive a drug information handout for each medication shipped and additional FDA Medication Guides as required.       DISEASE/MEDICATION-SPECIFIC INFORMATION        N/A    SPECIALTY MEDICATION ADHERENCE     Medication Adherence    Patient reported X missed doses in the last month: 0  Specialty Medication: Tecfidera  Patient is on additional specialty medications: No  Patient is on more than two specialty medications: No  Any gaps in refill history greater than 2 weeks in the last 3 months: no  Demonstrates understanding of importance of adherence: yes  Informant: patient                Tecfidera 240mg : Patient has 16 days of medication on hand      SHIPPING     Shipping address confirmed in Epic.     Delivery Scheduled: Yes, Expected medication delivery date: 3/30.     Medication will be delivered via Next Day Courier to the prescription address in Epic WAM.    Hannah Barr   [facility] Specialty Technician

## 2020-02-14 NOTE — Unmapped (Signed)
Last Visit Date: 07/04/2019 (Telemedicine)  Next Visit Date: Visit date not found    No results found for: JCVIRUSAB, HBSAG, HEPBSAB, HBQT, HEPBCAB, HEPCAB, HIV     No results found for this or any previous visit.    No results found for this or any previous visit.    No results found for this or any previous visit.

## 2020-02-15 DIAGNOSIS — G35 Multiple sclerosis: Secondary | ICD-10-CM | POA: Diagnosis not present

## 2020-02-16 DIAGNOSIS — G35 Multiple sclerosis: Secondary | ICD-10-CM | POA: Diagnosis not present

## 2020-02-17 DIAGNOSIS — G35 Multiple sclerosis: Secondary | ICD-10-CM | POA: Diagnosis not present

## 2020-02-18 DIAGNOSIS — G35 Multiple sclerosis: Secondary | ICD-10-CM | POA: Diagnosis not present

## 2020-02-18 DIAGNOSIS — H1045 Other chronic allergic conjunctivitis: Secondary | ICD-10-CM | POA: Diagnosis not present

## 2020-02-18 MED ORDER — DIMETHYL FUMARATE 240 MG CAPSULE,DELAYED RELEASE
ORAL_CAPSULE | ORAL | 0 refills | 0 days | Status: CP
Start: 2020-02-18 — End: 2021-02-17
  Filled 2020-02-25: qty 60, 30d supply, fill #0

## 2020-02-18 NOTE — Unmapped (Signed)
SECOND ATTEMPT to reach out to patient.  Refill Tecfidera month by month.    Patient is past due for clinic visit.  Staff notified to call and schedule appointment and my chart message and /or letter sent to patient.    Last seen in clinic and last safety labs 06/2019

## 2020-02-19 DIAGNOSIS — G35 Multiple sclerosis: Secondary | ICD-10-CM | POA: Diagnosis not present

## 2020-02-20 DIAGNOSIS — G35 Multiple sclerosis: Secondary | ICD-10-CM | POA: Diagnosis not present

## 2020-02-21 DIAGNOSIS — G35 Multiple sclerosis: Secondary | ICD-10-CM | POA: Diagnosis not present

## 2020-02-22 DIAGNOSIS — G35 Multiple sclerosis: Secondary | ICD-10-CM | POA: Diagnosis not present

## 2020-02-23 DIAGNOSIS — G35 Multiple sclerosis: Secondary | ICD-10-CM | POA: Diagnosis not present

## 2020-02-24 DIAGNOSIS — G35 Multiple sclerosis: Secondary | ICD-10-CM | POA: Diagnosis not present

## 2020-02-25 DIAGNOSIS — G35 Multiple sclerosis: Secondary | ICD-10-CM | POA: Diagnosis not present

## 2020-02-25 MED FILL — TECFIDERA 240 MG CAPSULE,DELAYED RELEASE: 30 days supply | Qty: 60 | Fill #0 | Status: AC

## 2020-02-26 DIAGNOSIS — G35 Multiple sclerosis: Secondary | ICD-10-CM | POA: Diagnosis not present

## 2020-02-27 DIAGNOSIS — G35 Multiple sclerosis: Secondary | ICD-10-CM | POA: Diagnosis not present

## 2020-02-28 DIAGNOSIS — G35 Multiple sclerosis: Secondary | ICD-10-CM | POA: Diagnosis not present

## 2020-02-29 DIAGNOSIS — G35 Multiple sclerosis: Secondary | ICD-10-CM | POA: Diagnosis not present

## 2020-03-01 DIAGNOSIS — G35 Multiple sclerosis: Secondary | ICD-10-CM | POA: Diagnosis not present

## 2020-03-02 ENCOUNTER — Other Ambulatory Visit: Payer: Self-pay | Admitting: Family Medicine

## 2020-03-02 DIAGNOSIS — K219 Gastro-esophageal reflux disease without esophagitis: Secondary | ICD-10-CM

## 2020-03-02 DIAGNOSIS — G35 Multiple sclerosis: Secondary | ICD-10-CM | POA: Diagnosis not present

## 2020-03-02 NOTE — Telephone Encounter (Signed)
Requested Prescriptions  Pending Prescriptions Disp Refills  . omeprazole (PRILOSEC) 40 MG capsule [Pharmacy Med Name: OMEPRAZOLE DR 40 MG CAPSULE] 90 capsule 0    Sig: TAKE 1 CAPSULE BY MOUTH EVERY DAY     Gastroenterology: Proton Pump Inhibitors Passed - 03/02/2020  9:41 AM      Passed - Valid encounter within last 12 months    Recent Outpatient Visits          3 months ago Productive cough   [facility] Steele Sizer, MD   5 months ago Breakthrough bleeding   [facility] Steele Sizer, MD   6 months ago Acute non-recurrent maxillary sinusitis   [facility], Drue Stager, MD   6 months ago Migraine without aura and without status migrainosus, not intractable   [facility] [place], Drue Stager, MD   7 months ago Bilateral low back pain without sciatica, unspecified chronicity   [facility] Steele Sizer, MD

## 2020-03-03 DIAGNOSIS — G35 Multiple sclerosis: Secondary | ICD-10-CM | POA: Diagnosis not present

## 2020-03-04 DIAGNOSIS — G35 Multiple sclerosis: Secondary | ICD-10-CM | POA: Diagnosis not present

## 2020-03-05 DIAGNOSIS — G35 Multiple sclerosis: Secondary | ICD-10-CM | POA: Diagnosis not present

## 2020-03-05 DIAGNOSIS — H11002 Unspecified pterygium of left eye: Secondary | ICD-10-CM | POA: Diagnosis not present

## 2020-03-05 MED ORDER — OFLOXACIN 0.3 % EYE DROPS
0 days
Start: 2020-03-05 — End: ?

## 2020-03-06 DIAGNOSIS — G35 Multiple sclerosis: Secondary | ICD-10-CM | POA: Diagnosis not present

## 2020-03-07 DIAGNOSIS — G35 Multiple sclerosis: Secondary | ICD-10-CM | POA: Diagnosis not present

## 2020-03-08 DIAGNOSIS — G35 Multiple sclerosis: Secondary | ICD-10-CM | POA: Diagnosis not present

## 2020-03-09 DIAGNOSIS — G35 Multiple sclerosis: Secondary | ICD-10-CM | POA: Diagnosis not present

## 2020-03-10 DIAGNOSIS — G35 Multiple sclerosis: Secondary | ICD-10-CM | POA: Diagnosis not present

## 2020-03-11 DIAGNOSIS — G35 Multiple sclerosis: Secondary | ICD-10-CM | POA: Diagnosis not present

## 2020-03-12 DIAGNOSIS — G35 Multiple sclerosis: Secondary | ICD-10-CM | POA: Diagnosis not present

## 2020-03-13 DIAGNOSIS — G35 Multiple sclerosis: Secondary | ICD-10-CM | POA: Diagnosis not present

## 2020-03-14 DIAGNOSIS — G35 Multiple sclerosis: Secondary | ICD-10-CM | POA: Diagnosis not present

## 2020-03-15 DIAGNOSIS — G35 Multiple sclerosis: Secondary | ICD-10-CM | POA: Diagnosis not present

## 2020-03-16 DIAGNOSIS — G35 Multiple sclerosis: Secondary | ICD-10-CM | POA: Diagnosis not present

## 2020-03-17 DIAGNOSIS — I10 Essential (primary) hypertension: Secondary | ICD-10-CM | POA: Diagnosis not present

## 2020-03-17 DIAGNOSIS — G35 Multiple sclerosis: Secondary | ICD-10-CM | POA: Diagnosis not present

## 2020-03-18 DIAGNOSIS — I10 Essential (primary) hypertension: Secondary | ICD-10-CM | POA: Diagnosis not present

## 2020-03-18 DIAGNOSIS — G35 Multiple sclerosis: Secondary | ICD-10-CM | POA: Diagnosis not present

## 2020-03-18 MED ORDER — DIMETHYL FUMARATE 240 MG CAPSULE,DELAYED RELEASE
ORAL_CAPSULE | ORAL | 0 refills | 0 days | Status: CP
Start: 2020-03-18 — End: 2021-03-18
  Filled 2020-03-25: qty 60, 30d supply, fill #0

## 2020-03-18 NOTE — Unmapped (Signed)
[facility] Clinical Assessment & Refill Coordination Note    Hannah Barr, DOB: 10-02-1971  Phone: [phone] (home)     All above HIPAA information was verified with patient.     Was a Nurse, learning disability used for this call? No    Specialty Medication(s):   Neurology: Tecfidera     Current Outpatient Medications   Medication Sig Dispense Refill   ??? cholecalciferol, vitamin D3, (VITAMIN D3) 4,000 unit cap Take 4,000 Units by mouth daily. 30 capsule 11   ??? dimethyl fumarate (TECFIDERA) 240 mg CpDR Take 1 capsule (240mg ) by mouth twice daily with meals. 60 capsule 0   ??? linaclotide (LINZESS) 145 mcg capsule Take 145 mcg by mouth daily as needed (constipation).      ??? mesalamine (CANASA) 1000 MG suppository Insert 1,000 mg into the rectum nightly.     ??? SUMAtriptan (IMITREX) 100 MG tablet Take 100 mg by mouth every two (2) hours as needed.      ??? traZODone (DESYREL) 50 MG tablet TAKE 0.5-1 TABLETS (25-50 MG TOTAL) BY MOUTH AT BEDTIME AS NEEDED FOR SLEEP.  3     No current facility-administered medications for this visit.        Changes to medications: [name] reports no changes at this time.    Allergies   Allergen Reactions   ??? Penicillins Hives       Changes to allergies: No    SPECIALTY MEDICATION ADHERENCE     Tecfidera 240 mg: ~14 days of medicine on hand       Medication Adherence    Patient reported X missed doses in the last month: 0  Specialty Medication: Tecfidera  Patient is on additional specialty medications: No  Informant: patient          Specialty medication(s) dose(s) confirmed: Regimen is correct and unchanged.     Are there any concerns with adherence? No    Adherence counseling provided? Not needed    CLINICAL MANAGEMENT AND INTERVENTION      Clinical Benefit Assessment:    Do you feel the medicine is effective or helping your condition? Yes    Clinical Benefit counseling provided? Not needed    Adverse Effects Assessment:    Are you experiencing any side effects? No Are you experiencing difficulty administering your medicine? No    Quality of Life Assessment:    How many days over the past month did your MS  keep you from your normal activities? For example, brushing your teeth or getting up in the morning. 0    Have you discussed this with your provider? Not needed    Therapy Appropriateness:    Is therapy appropriate? Yes, therapy is appropriate and should be continued    DISEASE/MEDICATION-SPECIFIC INFORMATION      N/A    PATIENT SPECIFIC NEEDS     - Does the patient have any physical, cognitive, or cultural barriers? No    - Is the patient high risk? No     - Does the patient require a Care Management Plan? No     - Does the patient require physician intervention or other additional services (i.e. nutrition, smoking cessation, social work)? No      SHIPPING     Specialty Medication(s) to be Shipped:   Neurology: Tecfidera    Other medication(s) to be shipped: none     Changes to insurance: No    Delivery Scheduled: Yes, Expected medication delivery date: 03/26/20.  However, Rx  request for refills was sent to the provider as there are none remaining.     Medication will be delivered via Next Day Courier to the confirmed prescription address in [facility].    The patient will receive a drug information handout for each medication shipped and additional FDA Medication Guides as required.  Verified that patient has previously received a Conservation officer, historic buildings.    All of the patient's questions and concerns have been addressed.    Arnold Long   [facility] Specialty Pharmacist

## 2020-03-18 NOTE — Unmapped (Signed)
THRD ATTEMPT to reach out to patient.    Patient is past due for clinic visit.  Staff notified to call and schedule appointment and my chart message and /or letter sent to patient.    Last seen in clinic and last safety labs 06/2019.  This will be the LAST refill of Tecfidera without current labs.

## 2020-03-18 NOTE — Unmapped (Signed)
Last Visit Date: 07/04/2019 (telemedicine)  Next Visit Date: Visit date not found    No results found for: JCVIRUSAB, HBSAG, HEPBSAB, HBQT, HEPBCAB, HEPCAB, HIV     No results found for this or any previous visit.      No results found for this or any previous visit.      No results found for this or any previous visit.

## 2020-03-19 DIAGNOSIS — I10 Essential (primary) hypertension: Secondary | ICD-10-CM | POA: Diagnosis not present

## 2020-03-19 DIAGNOSIS — G35 Multiple sclerosis: Secondary | ICD-10-CM | POA: Diagnosis not present

## 2020-03-20 DIAGNOSIS — I10 Essential (primary) hypertension: Secondary | ICD-10-CM | POA: Diagnosis not present

## 2020-03-20 DIAGNOSIS — G35 Multiple sclerosis: Secondary | ICD-10-CM | POA: Diagnosis not present

## 2020-03-21 DIAGNOSIS — I10 Essential (primary) hypertension: Secondary | ICD-10-CM | POA: Diagnosis not present

## 2020-03-21 DIAGNOSIS — G35 Multiple sclerosis: Secondary | ICD-10-CM | POA: Diagnosis not present

## 2020-03-22 DIAGNOSIS — G35 Multiple sclerosis: Secondary | ICD-10-CM | POA: Diagnosis not present

## 2020-03-23 DIAGNOSIS — G35 Multiple sclerosis: Secondary | ICD-10-CM | POA: Diagnosis not present

## 2020-03-24 DIAGNOSIS — G35 Multiple sclerosis: Secondary | ICD-10-CM | POA: Diagnosis not present

## 2020-03-25 DIAGNOSIS — G35 Multiple sclerosis: Secondary | ICD-10-CM | POA: Diagnosis not present

## 2020-03-25 MED FILL — TECFIDERA 240 MG CAPSULE,DELAYED RELEASE: 30 days supply | Qty: 60 | Fill #0 | Status: AC

## 2020-03-26 DIAGNOSIS — G35 Multiple sclerosis: Secondary | ICD-10-CM | POA: Diagnosis not present

## 2020-03-27 DIAGNOSIS — G35 Multiple sclerosis: Secondary | ICD-10-CM | POA: Diagnosis not present

## 2020-03-28 DIAGNOSIS — G35 Multiple sclerosis: Secondary | ICD-10-CM | POA: Diagnosis not present

## 2020-03-29 DIAGNOSIS — G35 Multiple sclerosis: Secondary | ICD-10-CM | POA: Diagnosis not present

## 2020-03-30 DIAGNOSIS — G35 Multiple sclerosis: Secondary | ICD-10-CM | POA: Diagnosis not present

## 2020-03-31 DIAGNOSIS — G35 Multiple sclerosis: Secondary | ICD-10-CM | POA: Diagnosis not present

## 2020-04-01 DIAGNOSIS — H11002 Unspecified pterygium of left eye: Secondary | ICD-10-CM | POA: Diagnosis not present

## 2020-04-01 DIAGNOSIS — G35 Multiple sclerosis: Secondary | ICD-10-CM | POA: Diagnosis not present

## 2020-04-01 DIAGNOSIS — Z01818 Encounter for other preprocedural examination: Secondary | ICD-10-CM | POA: Diagnosis not present

## 2020-04-02 DIAGNOSIS — G35 Multiple sclerosis: Secondary | ICD-10-CM | POA: Diagnosis not present

## 2020-04-03 DIAGNOSIS — G35 Multiple sclerosis: Secondary | ICD-10-CM | POA: Diagnosis not present

## 2020-04-04 DIAGNOSIS — G35 Multiple sclerosis: Secondary | ICD-10-CM | POA: Diagnosis not present

## 2020-04-04 DIAGNOSIS — M4716 Other spondylosis with myelopathy, lumbar region: Secondary | ICD-10-CM | POA: Diagnosis not present

## 2020-04-05 DIAGNOSIS — G35 Multiple sclerosis: Secondary | ICD-10-CM | POA: Diagnosis not present

## 2020-04-06 DIAGNOSIS — G35 Multiple sclerosis: Secondary | ICD-10-CM | POA: Diagnosis not present

## 2020-04-07 ENCOUNTER — Other Ambulatory Visit: Payer: Self-pay | Admitting: Family Medicine

## 2020-04-07 DIAGNOSIS — K5909 Other constipation: Secondary | ICD-10-CM

## 2020-04-07 DIAGNOSIS — G35 Multiple sclerosis: Secondary | ICD-10-CM | POA: Diagnosis not present

## 2020-04-07 DIAGNOSIS — H11002 Unspecified pterygium of left eye: Secondary | ICD-10-CM | POA: Diagnosis not present

## 2020-04-07 DIAGNOSIS — H25812 Combined forms of age-related cataract, left eye: Secondary | ICD-10-CM | POA: Diagnosis not present

## 2020-04-07 DIAGNOSIS — H11001 Unspecified pterygium of right eye: Secondary | ICD-10-CM | POA: Diagnosis not present

## 2020-04-07 MED ORDER — CELECOXIB 200 MG CAPSULE
0 days
Start: 2020-04-07 — End: ?

## 2020-04-08 DIAGNOSIS — G35 Multiple sclerosis: Secondary | ICD-10-CM | POA: Diagnosis not present

## 2020-04-09 DIAGNOSIS — G35 Multiple sclerosis: Secondary | ICD-10-CM | POA: Diagnosis not present

## 2020-04-10 DIAGNOSIS — G35 Multiple sclerosis: Secondary | ICD-10-CM | POA: Diagnosis not present

## 2020-04-11 DIAGNOSIS — G35 Multiple sclerosis: Secondary | ICD-10-CM | POA: Diagnosis not present

## 2020-04-12 DIAGNOSIS — G35 Multiple sclerosis: Secondary | ICD-10-CM | POA: Diagnosis not present

## 2020-04-13 DIAGNOSIS — G35 Multiple sclerosis: Secondary | ICD-10-CM | POA: Diagnosis not present

## 2020-04-14 DIAGNOSIS — G35 Multiple sclerosis: Secondary | ICD-10-CM | POA: Diagnosis not present

## 2020-04-15 DIAGNOSIS — G35 Multiple sclerosis: Secondary | ICD-10-CM | POA: Diagnosis not present

## 2020-04-16 DIAGNOSIS — G35 Multiple sclerosis: Secondary | ICD-10-CM | POA: Diagnosis not present

## 2020-04-16 MED ORDER — PREDNISOLONE ACETATE 1 % EYE DROPS,SUSPENSION
0 days
Start: 2020-04-16 — End: ?

## 2020-04-17 ENCOUNTER — Ambulatory Visit: Payer: Medicare HMO | Attending: Internal Medicine

## 2020-04-17 DIAGNOSIS — Z23 Encounter for immunization: Secondary | ICD-10-CM

## 2020-04-17 DIAGNOSIS — G35 Multiple sclerosis: Secondary | ICD-10-CM | POA: Diagnosis not present

## 2020-04-17 NOTE — Progress Notes (Signed)
   Covid-19 Vaccination Clinic  Name:  Tammie Lopez    MRN: [id number] DOB: December 27, 1970  04/17/2020  Tammie Lopez was observed post Covid-19 immunization for 15 minutes without incident. She was provided with Vaccine Information Sheet and instruction to access the V-Safe system.   Tammie Lopez was instructed to call 911 with any severe reactions post vaccine: Marland Kitchen Difficulty breathing  . Swelling of face and throat  . A fast heartbeat  . A bad rash all over body  . Dizziness and weakness   Immunizations Administered    Name Date Dose VIS Date Route   Pfizer COVID-19 Vaccine 04/17/2020  4:43 PM 0.3 mL 01/23/2019 Intramuscular   Manufacturer: [place]   Lot: [id number]   [place]: [id number]

## 2020-04-18 DIAGNOSIS — G35 Multiple sclerosis: Secondary | ICD-10-CM | POA: Diagnosis not present

## 2020-04-18 MED ORDER — DIMETHYL FUMARATE 240 MG CAPSULE,DELAYED RELEASE
ORAL_CAPSULE | ORAL | 0 refills | 0 days | Status: CN
Start: 2020-04-18 — End: 2021-04-18

## 2020-04-19 DIAGNOSIS — G35 Multiple sclerosis: Secondary | ICD-10-CM | POA: Diagnosis not present

## 2020-04-20 DIAGNOSIS — G35 Multiple sclerosis: Secondary | ICD-10-CM | POA: Diagnosis not present

## 2020-04-21 ENCOUNTER — Ambulatory Visit (INDEPENDENT_AMBULATORY_CARE_PROVIDER_SITE_OTHER): Payer: Medicare HMO | Admitting: Family Medicine

## 2020-04-21 ENCOUNTER — Other Ambulatory Visit: Payer: Self-pay

## 2020-04-21 ENCOUNTER — Encounter: Payer: Self-pay | Admitting: Family Medicine

## 2020-04-21 VITALS — BP 140/90 | HR 102 | Temp 97.5°F | Resp 16 | Ht 62.0 in | Wt 164.7 lb

## 2020-04-21 DIAGNOSIS — D708 Other neutropenia: Secondary | ICD-10-CM | POA: Diagnosis not present

## 2020-04-21 DIAGNOSIS — E785 Hyperlipidemia, unspecified: Secondary | ICD-10-CM | POA: Diagnosis not present

## 2020-04-21 DIAGNOSIS — K219 Gastro-esophageal reflux disease without esophagitis: Secondary | ICD-10-CM

## 2020-04-21 DIAGNOSIS — R69 Illness, unspecified: Secondary | ICD-10-CM | POA: Diagnosis not present

## 2020-04-21 DIAGNOSIS — G35 Multiple sclerosis: Secondary | ICD-10-CM | POA: Diagnosis not present

## 2020-04-21 DIAGNOSIS — I10 Essential (primary) hypertension: Secondary | ICD-10-CM | POA: Diagnosis not present

## 2020-04-21 DIAGNOSIS — E559 Vitamin D deficiency, unspecified: Secondary | ICD-10-CM | POA: Diagnosis not present

## 2020-04-21 DIAGNOSIS — G43009 Migraine without aura, not intractable, without status migrainosus: Secondary | ICD-10-CM

## 2020-04-21 DIAGNOSIS — J302 Other seasonal allergic rhinitis: Secondary | ICD-10-CM

## 2020-04-21 DIAGNOSIS — F325 Major depressive disorder, single episode, in full remission: Secondary | ICD-10-CM

## 2020-04-21 DIAGNOSIS — R7989 Other specified abnormal findings of blood chemistry: Secondary | ICD-10-CM | POA: Diagnosis not present

## 2020-04-21 MED ORDER — ATENOLOL 50 MG-CHLORTHALIDONE 25 MG TABLET
ORAL | 0 days
Start: 2020-04-21 — End: ?

## 2020-04-21 MED ORDER — FLUTICASONE PROPIONATE 50 MCG/ACT NA SUSP: 2.0000 | Freq: Every day | NASAL | 0 refills | Status: DC

## 2020-04-21 MED ORDER — CLONIDINE HCL 0.1 MG PO TABS
0.1000 mg | ORAL_TABLET | Freq: Every evening | ORAL | 0 refills | Status: DC
Start: 2020-04-21 — End: 2020-07-13

## 2020-04-21 MED ORDER — ATENOLOL-CHLORTHALIDONE 50-25 MG PO TABS: 0.5000 | ORAL_TABLET | Freq: Two times a day (BID) | ORAL | 0 refills | Status: DC

## 2020-04-21 NOTE — Progress Notes (Signed)
Name: Tammie Lopez   MRN: [id number]    DOB: 02-14-1971   Date:04/21/2020       Progress Note  Subjective  Chief Complaint  Chief Complaint  Patient presents with  . Hypertension    Patient has concerns about blood pressure being elevated x 2 weeks since she had eye surgery. She has had some chest pain and headaches.   . Fatigue    Just feels really tired.  . Night Sweats    She has been waking up at night drench with cold sweat. Thinks it may be menopausal, but unsure.  . Numbness    She has numbness in her left thumb and left great toe.    HPI  HTN: patient has a history of hypertension but bp normalized and has been off medications for months. 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 No nausea, vomiting or double vision.   Low TSH: she was supposed to come back in one month for recheck, but lost to follow up. We will recheck today, explained it may be the cause of elevated bp and also hot flashes.  Hot flashes: intense and worse at night, she has to get up and take a shower. We will add clonidine at night and just in case it is secondary to hyperthyroidism we will also add beta-blocker   MS: she has noticed some numbness on hands and feet, likely unrelated to other problems, advised to follow up with neurologist   Patient Active Problem List   Diagnosis Date Noted  . Low TSH level 09/19/2019  . Obesity (BMI 30-39.9) 12/06/2017  . Insulin resistance 09/27/2017  . Essential hypertension 09/21/2017  . Right sided sciatica 09/21/2017  . Major depression, recurrent ([place]) 05/24/2017  . Migraine without aura, not intractable 12/09/2016  . Nickel allergy 12/09/2016  . GERD without esophagitis 12/09/2016  . Other neutropenia ([place]) 12/09/2016  . Dyslipidemia 12/09/2016  . Diverticulosis 12/09/2016  . 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 Allergic rhinitis, seasonal 12/09/2016  . Chronic constipation  12/09/2016  . Vitamin D deficiency 08/06/2013  . MS (multiple sclerosis) ([place]) 04/17/2013    Past Surgical History:  Procedure Laterality Date  . BREAST BIOPSY Left 01/06/2018   neg  . BREAST BIOPSY Left 01/06/2018   neg  . BUNIONECTOMY    . CESAREAN SECTION N/A 2006  . GANGLION CYST EXCISION Bilateral    Hands    Family History  Problem Relation Age of Onset  . Diabetes Mother   . Hypertension Mother   . Cancer Father 75       Mouth  . COPD Father   . Hypertension Brother   . Diabetes Brother     Social History   Tobacco Use  . Smoking status: Never Smoker  . Smokeless tobacco: Never Used  . Tobacco comment: smoking cessation materials not required  Substance Use Topics  . Alcohol use: No     Current Outpatient Medications:  .  amitriptyline (ELAVIL) 25 MG tablet, Take 1 tablet by mouth as needed for sleep., Disp: , Rfl: 2 .  Cholecalciferol 100 MCG (4000 UT) CAPS, Take by mouth., Disp: , Rfl:  .  cyclobenzaprine (FLEXERIL) 10 MG tablet, Take by mouth., Disp: , Rfl:  .  diclofenac sodium (VOLTAREN) 1 % GEL, APPLY 4 G TOPICALLY 4 (FOUR) TIMES DAILY., Disp: 300 g, Rfl: 0 .  Dimethyl Fumarate (TECFIDERA) 240 MG CPDR, Take 1  capsule (240 mg total) by mouth 2 (two) times daily., Disp: 60 capsule, Rfl: 0 .  fluticasone (FLONASE) 50 MCG/ACT nasal spray, Place 2 sprays into both nostrils daily., Disp: 16 g, Rfl: 0 .  LINZESS 290 MCG CAPS capsule, TAKE 1 CAPSULE (290 MCG TOTAL) BY MOUTH DAILY., Disp: 30 capsule, Rfl: 2 .  Multiple Vitamin (MULTIVITAMIN) tablet, Take 1 tablet by mouth daily., Disp: , Rfl:  .  omeprazole (PRILOSEC) 40 MG capsule, TAKE 1 CAPSULE BY MOUTH EVERY DAY, Disp: 90 capsule, Rfl: 0 .  prednisoLONE acetate (PRED FORTE) 1 % ophthalmic suspension, , Disp: , Rfl:  .  SUMAtriptan (IMITREX) 100 MG tablet, TAKE 1 TABLET (100 MG TOTAL) BY MOUTH EVERY 2 (TWO) HOURS AS NEEDED FOR MIGRAINE. MAY REPEAT IN 2 HOURS IF HEADACHE PERSISTS OR RECURS., Disp: 9 tablet,  Rfl: 1 .  traZODone (DESYREL) 50 MG tablet, TAKE 0.5-1 TABLETS (25-50 MG TOTAL) BY MOUTH AT BEDTIME AS NEEDED FOR SLEEP., Disp: 90 tablet, Rfl: 0 .  atenolol-chlorthalidone (TENORETIC) 50-25 MG tablet, Take 0.5 tablets by mouth in the morning and at bedtime., Disp: 30 tablet, Rfl: 0 .  cloNIDine (CATAPRES) 0.1 MG tablet, Take 1 tablet (0.1 mg total) by mouth at bedtime. Blood pressure and hot flashes, Disp: 90 tablet, Rfl: 0  Allergies  Allergen Reactions  . Penicillins Hives    I personally reviewed active problem list, medication list, allergies, family history, social history, health maintenance with the patient/caregiver today.   ROS  Constitutional: Negative for fever, positive for  weight change.  Respiratory: Negative for cough and shortness of breath.   Cardiovascular: Negative for chest pain or palpitations.  Gastrointestinal: Negative for abdominal pain, no bowel changes.  Musculoskeletal: Negative for gait problem or joint swelling.  Skin: Negative for rash.  Neurological: Negative for dizziness, positive for intermittent  headache.  No other specific complaints in a complete review of systems (except as listed in HPI above).  Objective  Vitals:   04/21/20 1021 04/21/20 1128  BP: (!) 150/100 140/90  Pulse: (!) 102   Resp: 16   Temp: (!) 97.5 F (36.4 C)   TempSrc: Temporal   SpO2: 97%   Weight: 164 lb 11.2 oz (74.7 kg)   Height: 5\' 2"  (1.575 m)     Body mass index is 30.12 kg/m.  Physical Exam  Constitutional: Patient appears well-developed and well-nourished. Obese  No distress.  HEENT: head atraumatic, normocephalic, pupils equal and reactive to light,  neck supple no thyromegaly  Cardiovascular: Normal rate, regular rhythm and normal heart sounds.  No murmur heard. No BLE edema. Pulmonary/Chest: Effort normal and breath sounds normal. No respiratory distress. Abdominal: Soft.  There is no tenderness. Psychiatric: Patient has a normal mood and affect.  behavior is normal. Judgment and thought content normal.  PHQ2/9: Depression screen [facility] 2/9 04/21/2020 11/06/2019 09/18/2019 08/29/2019 08/10/2019  Decreased Interest 0 0 0 0 0  Down, Depressed, Hopeless 0 0 0 0 0  PHQ - 2 Score 0 0 0 0 0  Altered sleeping 0 0 0 0 0  Tired, decreased energy 0 0 0 0 0  Change in appetite 0 0 0 0 0  Feeling bad or failure about yourself  0 0 0 0 0  Trouble concentrating 0 0 0 0 0  Moving slowly or fidgety/restless 0 0 0 0 0  Suicidal thoughts 0 0 0 0 0  PHQ-9 Score 0 0 0 0 0  Difficult doing work/chores - - - - Not difficult  at all  Some recent data might be hidden    phq 9 is negative   Fall Risk: Fall Risk  04/21/2020 09/18/2019 08/10/2019 07/24/2019 07/23/2019  Falls in the past year? 0 0 0 0 0  Number falls in past yr: 0 0 0 0 0  Injury with Fall? 0 0 0 0 0  Follow up - - - Falls evaluation completed -      Assessment & Plan  1. Other neutropenia ([place])  - CBC with Differential/Platelet  2. MS (multiple sclerosis) ([facility])  - COMPLETE METABOLIC PANEL WITH GFR  3. Vitamin D deficiency disease  She needs to resume Vitamin D supplementation   4. Migraine without aura and without status migrainosus, not intractable  No recent episodes, taking elavil prn   5. Dyslipidemia  - Lipid panel  6. GERD without esophagitis  Still present, taking pantoprazole   7. Low TSH level  - TSH  8. Uncontrolled hypertension  - CBC with Differential/Platelet - COMPLETE METABOLIC PANEL WITH GFR  9. Seasonal allergies  - fluticasone (FLONASE) 50 MCG/ACT nasal spray; Place 2 sprays into both nostrils daily.  Dispense: 16 g; Refill: 0  10. Major depression in remission [facility])  She states just frustrated because she is not feeling well

## 2020-04-22 DIAGNOSIS — G35 Multiple sclerosis: Secondary | ICD-10-CM | POA: Diagnosis not present

## 2020-04-22 LAB — LIPID PANEL
Cholesterol: 175 mg/dL (ref <200)
HDL: 57 mg/dL (ref >=50)
LDL Cholesterol (Calc): 99 mg/dL (calc)
Non-HDL Cholesterol (Calc): 118 mg/dL (calc) (ref <130)
Total CHOL/HDL Ratio: 3.1 (calc) (ref <5.0)
Triglycerides: 96 mg/dL (ref <150)

## 2020-04-22 LAB — TSH: TSH: 0.73 mIU/L

## 2020-04-22 LAB — COMPLETE METABOLIC PANEL WITH GFR
AG Ratio: 1.6 (calc) (ref 1.0–2.5)
ALT: 17 U/L (ref 6–29)
AST: 17 U/L (ref 10–35)
Albumin: 4.4 g/dL (ref 3.6–5.1)
Alkaline phosphatase (APISO): 31 U/L (ref 31–125)
BUN: 8 mg/dL (ref 7–25)
CO2: 29 mmol/L (ref 20–32)
Calcium: 9.4 mg/dL (ref 8.6–10.2)
Chloride: 103 mmol/L (ref 98–110)
Creat: 0.7 mg/dL (ref 0.50–1.10)
GFR, Est African American: 120 mL/min/{1.73_m2} (ref >=60)
GFR, Est Non African American: 103 mL/min/{1.73_m2} (ref >=60)
Globulin: 2.8 g/dL (calc) (ref 1.9–3.7)
Glucose, Bld: 88 mg/dL (ref 65–99)
Potassium: 4.2 mmol/L (ref 3.5–5.3)
Sodium: 140 mmol/L (ref 135–146)
Total Bilirubin: 0.3 mg/dL (ref 0.2–1.2)
Total Protein: 7.2 g/dL (ref 6.1–8.1)

## 2020-04-22 LAB — CBC WITH DIFFERENTIAL/PLATELET
Absolute Monocytes: 350 cells/uL (ref 200–950)
Basophils Absolute: 10 cells/uL (ref 0–200)
Basophils Relative: 0.5 %
Eosinophils Absolute: 21 cells/uL (ref 15–500)
Eosinophils Relative: 1.1 %
HCT: 37.1 % (ref 35.0–45.0)
Hemoglobin: 12 g/dL (ref 11.7–15.5)
Lymphs Abs: 770 cells/uL — ABNORMAL LOW (ref 850–3900)
MCH: 26.9 pg — ABNORMAL LOW (ref 27.0–33.0)
MCHC: 32.3 g/dL (ref 32.0–36.0)
MCV: 83.2 fL (ref 80.0–100.0)
MPV: 11.1 fL (ref 7.5–12.5)
Monocytes Relative: 18.4 %
Neutro Abs: 751 cells/uL — ABNORMAL LOW (ref 1500–7800)
Neutrophils Relative %: 39.5 %
Platelets: 250 10*3/uL (ref 140–400)
RBC: 4.46 10*6/uL (ref 3.80–5.10)
RDW: 12.7 % (ref 11.0–15.0)
Total Lymphocyte: 40.5 %
WBC: 1.9 10*3/uL — ABNORMAL LOW (ref 3.8–10.8)

## 2020-04-23 DIAGNOSIS — G35 Multiple sclerosis: Secondary | ICD-10-CM | POA: Diagnosis not present

## 2020-04-24 DIAGNOSIS — G35 Multiple sclerosis: Secondary | ICD-10-CM | POA: Diagnosis not present

## 2020-04-25 DIAGNOSIS — G35 Multiple sclerosis: Secondary | ICD-10-CM | POA: Diagnosis not present

## 2020-04-26 DIAGNOSIS — G35 Multiple sclerosis: Secondary | ICD-10-CM | POA: Diagnosis not present

## 2020-04-27 DIAGNOSIS — G35 Multiple sclerosis: Secondary | ICD-10-CM | POA: Diagnosis not present

## 2020-04-28 ENCOUNTER — Other Ambulatory Visit: Payer: Self-pay | Admitting: Family Medicine

## 2020-04-28 DIAGNOSIS — D72819 Decreased white blood cell count, unspecified: Secondary | ICD-10-CM

## 2020-04-28 DIAGNOSIS — G35 Multiple sclerosis: Secondary | ICD-10-CM | POA: Diagnosis not present

## 2020-04-29 DIAGNOSIS — G35 Multiple sclerosis: Secondary | ICD-10-CM | POA: Diagnosis not present

## 2020-04-30 DIAGNOSIS — G35 Multiple sclerosis: Secondary | ICD-10-CM | POA: Diagnosis not present

## 2020-04-30 NOTE — Unmapped (Signed)
[facility] Refill Coordination Note    Specialty Medication(s) to be Shipped:   Neurology: Dimethyl fumarate     Other medication(s) to be shipped: n/a     Hannah Barr, DOB: 11-11-1971  Phone: [phone] (home)       All above HIPAA information was verified with patient.     Was a Nurse, learning disability used for this call? No    Completed refill call assessment today to schedule patient's medication shipment from the [facility] [phone]).       Specialty medication(s) and dose(s) confirmed: Regimen is correct and unchanged.   Changes to medications: Hannah Barr reports no changes at this time.  Changes to insurance: No  Questions for the pharmacist: No    Confirmed patient received Welcome Packet with first shipment. The patient will receive a drug information handout for each medication shipped and additional FDA Medication Guides as required.       DISEASE/MEDICATION-SPECIFIC INFORMATION        N/A    SPECIALTY MEDICATION ADHERENCE     Medication Adherence    Patient reported X missed doses in the last month: 0  Specialty Medication: Tecfidera  Patient is on additional specialty medications: No  Patient is on more than two specialty medications: No  Any gaps in refill history greater than 2 weeks in the last 3 months: no  Demonstrates understanding of importance of adherence: yes  Informant: patient                Dimethyl fumerate 240mg : Patient has 7 days of medication on hand       SHIPPING     Shipping address confirmed in Epic.     Delivery Scheduled: Yes, Expected medication delivery date: 6/9.     Medication will be delivered via Next Day Courier to the prescription address in Epic WAM.    [name]   [facility] Specialty Technician

## 2020-05-01 DIAGNOSIS — G35 Multiple sclerosis: Secondary | ICD-10-CM | POA: Diagnosis not present

## 2020-05-02 DIAGNOSIS — G35 Multiple sclerosis: Secondary | ICD-10-CM | POA: Diagnosis not present

## 2020-05-03 ENCOUNTER — Ambulatory Visit: Admit: 2020-05-03 | Discharge: 2020-05-03 | Disposition: A | Discharge status: Home / Self Care | Payer: MEDICARE

## 2020-05-03 DIAGNOSIS — G35 Multiple sclerosis: Secondary | ICD-10-CM | POA: Diagnosis not present

## 2020-05-03 DIAGNOSIS — I10 Essential (primary) hypertension: Secondary | ICD-10-CM | POA: Diagnosis not present

## 2020-05-03 DIAGNOSIS — R519 Nonintractable headache, unspecified chronicity pattern, unspecified headache type: Principal | ICD-10-CM

## 2020-05-03 DIAGNOSIS — Z880 Allergy status to penicillin: Secondary | ICD-10-CM | POA: Diagnosis not present

## 2020-05-03 DIAGNOSIS — G43909 Migraine, unspecified, not intractable, without status migrainosus: Secondary | ICD-10-CM | POA: Diagnosis not present

## 2020-05-03 MED ADMIN — diphenhydrAMINE (BENADRYL) injection 25 mg: 25 mg | INTRAVENOUS | @ 21:00:00 | Stop: 2020-05-03

## 2020-05-03 MED ADMIN — prochlorperazine (COMPAZINE) 10 mg in sodium chloride (NS) 0.9 % 25 mL IVPB: 10 mg | INTRAVENOUS | @ 21:00:00 | Stop: 2020-05-03

## 2020-05-03 MED ADMIN — ketorolac (TORADOL) injection 15 mg: 15 mg | INTRAVENOUS | @ 21:00:00 | Stop: 2020-05-03

## 2020-05-03 NOTE — Unmapped (Signed)
Pt with 3 days of migraine with photophobia.   Pt out of Imitrex.  Took Tylenol at 11:00 today.  Had 1 Covid shot in May and has been feeling bad since.

## 2020-05-03 NOTE — Unmapped (Signed)
CHIEF COMPLAINT: headache    HPI:   Ms Hannah Barr has a history of MS and migraine headaches.  She reports that she has had a headache typical of her migraine headaches but more like one of her more severe migraine headaches for the last 3 days.  Headache has been coming and going but there most of the time.  She was out of Imitrex but she found a partial dose and took it before coming here.  Her headache is now resolving somewhat.  She has had some tingling in her left hand.  She thinks that this is due to her MS.  The hand tingling is not there now.  She denies problems with her speech, gait, vision, muscle strength, or sensation.  She had the Pfizer Covid vaccine in May.    ROS: See HPI  Constitutional: no fever   Eyes: no new visual changes  ENT: no trouble breathing or swallowing    Cardiovascular:  no chest pain   Resp: no SOB   GI: no vomiting or dh  GU: no dysuria  Integumentary: no rash   Allergy: no hives   Musculoskeletal: no leg swelling   Neurological: no slurred speech  10 point ROS otherwise negative except as documented    PAST MEDICAL HISTORY/PAST SURGICAL HISTORY:   Past Medical History:   Diagnosis Date   ??? Anemia    ??? Essential hypertension, benign    ??? GERD (gastroesophageal reflux disease)    ??? Migraine    ??? MS (multiple sclerosis) (CMS-HCC)        MEDICATIONS:   Prior to Admission medications    Medication Sig Start Date End Date Taking? Authorizing Provider   atenoloL-chlorthalidone (TENORETIC) 50-25 mg per tablet Take 0.5 tablets by mouth. 04/21/20  Yes Historical Provider, MD   celecoxib (CELEBREX) 200 MG capsule TAKE 1 CAPSULE BY MOUTH TWICE A DAY 04/07/20  Yes Historical Provider, MD   dimethyl fumarate (TECFIDERA) 240 mg CpDR Take 1 capsule (240mg ) by mouth twice daily with meals. 03/18/20 03/18/21 Yes Clara Doreatha Massed, PA   cholecalciferol, vitamin D3, (VITAMIN D3) 4,000 unit cap Take 4,000 Units by mouth daily. 06/15/18   Sarita Bottom, MD   linaclotide Karlene Einstein) 145 mcg capsule Take 145 mcg by mouth daily as needed (constipation).  01/03/16   Historical Provider, MD   mesalamine (CANASA) 1000 MG suppository Insert 1,000 mg into the rectum nightly.    Historical Provider, MD   ofloxacin (OCUFLOX) 0.3 % ophthalmic solution INSTILL 1 DROP INTO AFFECTED EYE 4 TIMES A DAY 03/05/20   Historical Provider, MD   SUMAtriptan (IMITREX) 100 MG tablet Take 100 mg by mouth every two (2) hours as needed.  05/30/17   Historical Provider, MD   traZODone (DESYREL) 50 MG tablet TAKE 0.5-1 TABLETS (25-50 MG TOTAL) BY MOUTH AT BEDTIME AS NEEDED FOR SLEEP. 06/06/18   Historical Provider, MD   glatiramer (COPAXONE) 40 mg/mL Syrg Inject 40 mg total under the skin every Monday, Wednesday, & Friday. Separate doses by at least 48 hours. 04/11/18 06/15/18  Clara Doreatha Massed, PA       ALLERGIES:   Allergies   Allergen Reactions   ??? Penicillins Hives       SOCIAL HISTORY:   Social History     Tobacco Use   ??? Smoking status: Never Smoker   ??? Smokeless tobacco: Never Used   Substance Use Topics   ??? Alcohol use: No       FAMILY HISTORY:  Family History   Problem Relation Age of Onset   ??? Diabetes Mother    ??? Hypertension Mother    ??? Cancer Father    ??? COPD Father        EXAM:  CONSTITUTIONAL: Alert and oriented and responds appropriately to questions. Well-appearing; well-nourished  HEAD: Normocephalic  EYES: Conjunctivae clear  ENT: normal nose; no rhinorrhea; moist mucous membranes; pharynx without lesions noted  NECK: Supple   CARD: No JVD, normal perfusion  RESP: Normal chest excursion without splinting or tachypnea  SKIN: Normal color for age and race; warm  NEURO: Alert and Oriented x 4, speech nml, gait nml, CN's 2-12 intact, neg pron drift, ms 4/4 and equal bilat ue and le's. Sens nml bilat ue's and le's.   PSYCH: The patient's mood and manner are appropriate. Grooming and personal hygiene are appropriate. Pt does not express HI or SI.    ED PROGRESS/ MDM:   5:08 PM  Ms. McCray feels much better.  Her headache is gone.  She is not having arm numbness.  She would like to go home.  She will follow-up with her neurologist.  We discussed ED return precautions.  I have reviewed the old records.      Wendelyn Breslow, MD  05/03/20 [phone]

## 2020-05-04 DIAGNOSIS — G35 Multiple sclerosis: Secondary | ICD-10-CM | POA: Diagnosis not present

## 2020-05-06 DIAGNOSIS — G35 Multiple sclerosis: Secondary | ICD-10-CM | POA: Diagnosis not present

## 2020-05-07 DIAGNOSIS — G35 Multiple sclerosis: Secondary | ICD-10-CM | POA: Diagnosis not present

## 2020-05-08 ENCOUNTER — Ambulatory Visit: Payer: Medicare HMO | Attending: Internal Medicine

## 2020-05-08 DIAGNOSIS — G35 Multiple sclerosis: Secondary | ICD-10-CM | POA: Diagnosis not present

## 2020-05-08 DIAGNOSIS — Z23 Encounter for immunization: Secondary | ICD-10-CM

## 2020-05-08 NOTE — Progress Notes (Signed)
   Covid-19 Vaccination Clinic  Name:  ERCELLE WINKLES    MRN: [id number] DOB: [birth year]  05/08/2020  Ms. Hofmeister was observed post Covid-19 immunization for 15 minutes without incident. She was provided with Vaccine Information Sheet and instruction to access the V-Safe system.   Ms. Frid was instructed to call 911 with any severe reactions post vaccine: Marland Kitchen Difficulty breathing  . Swelling of face and throat  . A fast heartbeat  . A bad rash all over body  . Dizziness and weakness   Immunizations Administered    Name Date Dose VIS Date Route   Pfizer COVID-19 Vaccine 05/08/2020  4:06 PM 0.3 mL 01/23/2019 Intramuscular   Manufacturer: [employer]   Lot: [id number]   [place]: [id number]

## 2020-05-09 DIAGNOSIS — G35 Multiple sclerosis: Secondary | ICD-10-CM | POA: Diagnosis not present

## 2020-05-09 MED ORDER — DIMETHYL FUMARATE 240 MG CAPSULE,DELAYED RELEASE
ORAL_CAPSULE | ORAL | 0 refills | 0 days | Status: CP
Start: 2020-05-09 — End: ?
  Filled 2020-05-09: qty 60, 30d supply, fill #0

## 2020-05-09 MED FILL — TECFIDERA 240 MG CAPSULE,DELAYED RELEASE: 30 days supply | Qty: 60 | Fill #0 | Status: AC

## 2020-05-09 NOTE — Unmapped (Signed)
Patient had CBC w/ diff and LFTs done at [facility] in May 2021 (results in care everywhere). Sending refill to [facility] x 1 month until patient's next appt with Clara.    Worthy Flank, PharmD, CPP  Clinical Pharmacist, [facility]  Phone: [phone]

## 2020-05-10 DIAGNOSIS — G35 Multiple sclerosis: Secondary | ICD-10-CM | POA: Diagnosis not present

## 2020-05-11 DIAGNOSIS — G35 Multiple sclerosis: Secondary | ICD-10-CM | POA: Diagnosis not present

## 2020-05-12 DIAGNOSIS — G35 Multiple sclerosis: Secondary | ICD-10-CM | POA: Diagnosis not present

## 2020-05-13 ENCOUNTER — Other Ambulatory Visit: Payer: Self-pay

## 2020-05-13 DIAGNOSIS — J302 Other seasonal allergic rhinitis: Secondary | ICD-10-CM

## 2020-05-13 DIAGNOSIS — G35 Multiple sclerosis: Secondary | ICD-10-CM | POA: Diagnosis not present

## 2020-05-14 ENCOUNTER — Other Ambulatory Visit: Payer: Self-pay

## 2020-05-14 DIAGNOSIS — G35 Multiple sclerosis: Secondary | ICD-10-CM | POA: Diagnosis not present

## 2020-05-14 NOTE — Telephone Encounter (Signed)
Spoke with Patient and she confirmed she has enough until her follow up appointment when prescriptions can be reviewed at that time.

## 2020-05-15 ENCOUNTER — Ambulatory Visit: Payer: Medicare HMO | Admitting: Family Medicine

## 2020-05-15 ENCOUNTER — Other Ambulatory Visit: Payer: Self-pay | Admitting: Family Medicine

## 2020-05-15 DIAGNOSIS — J302 Other seasonal allergic rhinitis: Secondary | ICD-10-CM

## 2020-05-15 DIAGNOSIS — G35 Multiple sclerosis: Secondary | ICD-10-CM | POA: Diagnosis not present

## 2020-05-15 NOTE — Telephone Encounter (Signed)
appt scheduled for August.

## 2020-05-16 DIAGNOSIS — G35 Multiple sclerosis: Secondary | ICD-10-CM | POA: Diagnosis not present

## 2020-05-17 DIAGNOSIS — G35 Multiple sclerosis: Secondary | ICD-10-CM | POA: Diagnosis not present

## 2020-05-18 DIAGNOSIS — G35 Multiple sclerosis: Secondary | ICD-10-CM | POA: Diagnosis not present

## 2020-05-19 DIAGNOSIS — G35 Multiple sclerosis: Secondary | ICD-10-CM | POA: Diagnosis not present

## 2020-05-20 ENCOUNTER — Telehealth
Admit: 2020-05-20 | Discharge: 2020-05-21 | Payer: MEDICARE | Attending: Physician Assistant | Primary: Physician Assistant

## 2020-05-20 DIAGNOSIS — E559 Vitamin D deficiency, unspecified: Secondary | ICD-10-CM | POA: Diagnosis not present

## 2020-05-20 DIAGNOSIS — G35 Multiple sclerosis: Secondary | ICD-10-CM | POA: Diagnosis not present

## 2020-05-20 MED ORDER — AMITRIPTYLINE 25 MG TABLET: 1 | 0 refills | 0 days

## 2020-05-20 NOTE — Unmapped (Addendum)
[facility] [place] [facility] at [facility]  Multiple Sclerosis / Neuroimmunology Division  [facility]  Physician Assistant, Cordelia P[place], [place]  Phone: [phone]  Fax: [phone]      Patient Name: Abelina Ketron   Date of Birth: 1971-05-16  Medical Record Number: [id number]  [address]  [place] [address]     Direct entry by:  [name], PA-C, MPAS.  Supervising Physician: Dr. [name].    DATE OF VISIT: May 20, 2020    REASON FOR PATIENT OUTREACH / phone CALL ENCOUNTER: Follow up for evaluation of Multiple Sclerosis.          I spent 25 minutes on the phone visit with the patient on the date of service. I spent an additional 10 minutes on pre- and post-visit activities on the date of service.     The patient was not located and I was located within 250 yards of a hospital based location during the phone visit. The patient was physically located in [place] or a state in which I am permitted to provide care. The patient and/or parent/guardian understood that s/he may incur co-pays and cost sharing, and agreed to the telemedicine visit. The visit was reasonable and appropriate under the circumstances given the patient's presentation at the time.    The patient and/or parent/guardian has been advised of the potential risks and limitations of this mode of treatment (including, but not limited to, the absence of in-person examination) and has agreed to be treated using telemedicine. The patient's/patient's family's questions regarding telemedicine have been answered.    If the visit was completed in an ambulatory setting, the patient and/or parent/guardian has also been advised to contact their provider???s office for worsening conditions, and seek emergency medical treatment and/or call 911 if the patient deems either necessary.  **Evaluated by [name] 06/15/2018.    ASSESSMENT AND PLAN:   **  Relapsing Remitting Multiple Sclerosis.  -Started Tecfidera 05/2018. Discontinue Tecfidera today due to leukopenia, neutropenia and lymphopenia. .Lab results are in care everywhere. Followed by PCP who will repeat CBC/diff next week and place a referral to Hematology if needed.  **  Vitamin D deficiency: Continue Vit D3 4,000 units daily.  **Numbness in toes: Continue to monitor.  **Follow up in 6 weeks to discuss next steps.    INTERVAL HISTORY / CHIEF COMPLAINT :  Patient reports no MS relapses since the last visit 080/03/2019.  Reports numbness and tingling of left  Toes that is mild and comes and goes.  Received COVID19 vaccine 04/2020, [employer].    PRIOR HISTORY: A 49 y.o.  African-American left handed female who was diagnosed with relapsing remitting multiple sclerosis 02/11/2011.  History of Anemia, Essential hypertension,GERD, Migraine.   She was seen as a new patient on 02/10/2011 by Dr. Denice Bors.   Initial symptoms began in 12/2010. This started abruptly with occipital headaches with occasional tension pain in her neck. Two days later, she started having discrimination to light touch along the posterior aspect of her left forearm that radiated into her left shoulder. Over the next several days, her symptoms worsened. She began having numbness in her left arm. She then went to the emergency room at [facility], had MRI of the brain and C-spine, which showed demyelinating abnormalities.     SCREENING LABS:   02/10/2011 : ACE 21, ANA positive, 1:80, RF <6.3, Lyme Serology neg, B12 =  339.  04/11/2018 RPR nonreative, NMO  APQ4 IgG and MOG IgG1 neg.    04/11/2018 Baseline:  T25FW= 5.19 seconds.  MMSE = 30.  PHQ9 = 12.      09/20/2017 PHQ9 = 7.    MRI REVIEW:  05/10/2018  MRI of the brain with / without contrast. compared to 08/14/2016: ??There is a new 1.8 x 1.3 cm T2/FLAIR hyperintense lesion in the anterior left temporal lobe with peripheral rim enhancement. Other scattered white matter foci of abnormal increased T2/FLAIR signal are similar appearance of the prior exam.  The optic nerves are normal in appearance.    05/10/2018 MRI of the cervical spine with / without contrast.  compared to 08/14/2016: Increased T2 signal in the left cervical medullary junction and right lateral cord at C2. No other cervical cord signal abnormality detected.    05/17/2018 MRI of the thoracic spine with / without contrast. compared to 08/14/2016: Increased T2 signal within the central thoracic cord at the level of T3, left lateral and posterior central cord at the level of T5-6.  ??No abnormal enhancement is seen  03/29/2015 MRI of the thoracic spine showed a new enhancing lesion.    MS FLARE-UPS: Never received IVMP.    MS MEDICATION HISTORY:   She was started on Copaxone approximately 02/11/2011 - 05/2018.  Tecfidera 05/2018 due to increase in disease activity seen on MRI.     GYN HISTORY:  LNM 07/2016.    REVIEW OF SYSTEMS:   10 systems reviewed and otherwise negative except as noted in HPI.    No visits with results within 1 Month(s) from this visit.   Latest known visit with results is:   Appointment on 07/09/2019   Component Date Value Ref Range Status   ??? Creatinine 07/09/2019 0.55* 0.60 - 1.00 mg/dL Final   ??? EGFR CKD-EPI Non-African American,* 07/09/2019 >90  >=60 mL/min/1.38m2 Final   ??? EGFR CKD-EPI African American, Fem* 07/09/2019 >90  >=60 mL/min/1.21m2 Final   ??? Albumin 07/09/2019 4.3  3.5 - 5.0 g/dL Final   ??? Total Protein 07/09/2019 7.5  6.5 - 8.3 g/dL Final   ??? Total Bilirubin 07/09/2019 0.4  0.0 - 1.2 mg/dL Final   ??? Bilirubin, Direct 07/09/2019 <0.10  0.00 - 0.40 mg/dL Final   ??? AST 91/47/8295 18  14 - 38 U/L Final   ??? ALT 07/09/2019 12  <35 U/L Final   ??? Alkaline Phosphatase 07/09/2019 44  38 - 126 U/L Final   ??? Vitamin D Total (25OH) 07/09/2019 23.3  20.0 - 80.0 ng/mL Final   ??? WBC 07/09/2019 2.9* 4.5 - 11.0 10*9/L Final   ??? RBC 07/09/2019 4.46  4.00 - 5.20 10*12/L Final   ??? HGB 07/09/2019 12.3  12.0 - 16.0 g/dL Final   ??? HCT 62/13/0865 38.2 36.0 - 46.0 % Final   ??? MCV 07/09/2019 85.7  80.0 - 100.0 fL Final   ??? MCH 07/09/2019 27.7  26.0 - 34.0 pg Final   ??? MCHC 07/09/2019 32.3  31.0 - 37.0 g/dL Final   ??? RDW 78/46/9629 13.6  12.0 - 15.0 % Final   ??? MPV 07/09/2019 9.4  7.0 - 10.0 fL Final   ??? Platelet 07/09/2019 264  150 - 440 10*9/L Final   ??? Neutrophils % 07/09/2019 68.9  % Final   ??? Lymphocytes % 07/09/2019 22.3  % Final   ??? Monocytes % 07/09/2019 5.8  % Final   ??? Eosinophils % 07/09/2019 0.8  % Final   ??? Basophils % 07/09/2019 0.3  % Final   ???  Absolute Neutrophils 07/09/2019 2.0  2.0 - 7.5 10*9/L Final   ??? Absolute Lymphocytes 07/09/2019 0.7* 1.5 - 5.0 10*9/L Final   ??? Absolute Monocytes 07/09/2019 0.2  0.2 - 0.8 10*9/L Final   ??? Absolute Eosinophils 07/09/2019 0.0  0.0 - 0.4 10*9/L Final   ??? Absolute Basophils 07/09/2019 0.0  0.0 - 0.1 10*9/L Final   ??? Large Unstained Cells 07/09/2019 2  0 - 4 % Final   ??? Hypochromasia 07/09/2019 Slight* Not Present Final         Patient Active Problem List   Diagnosis   ??? Hypertension   ??? Multiple sclerosis (CMS-HCC)   ??? MS (multiple sclerosis) (CMS-HCC)   ??? Vitamin D deficiency     Current Outpatient Medications   Medication Sig Dispense Refill   ??? amitriptyline (ELAVIL) 25 MG tablet Take 1 tablet by mouth.     ??? cyclobenzaprine (FLEXERIL) 10 MG tablet Take by mouth.     ??? prednisoLONE acetate (PRED FORTE) 1 % ophthalmic suspension      ??? atenoloL-chlorthalidone (TENORETIC) 50-25 mg per tablet Take 0.5 tablets by mouth.     ??? cholecalciferol, vitamin D3, (VITAMIN D3) 4,000 unit cap Take 4,000 Units by mouth daily. 30 capsule 11   ??? linaclotide (LINZESS) 145 mcg capsule Take 145 mcg by mouth daily as needed (constipation).      ??? multivitamin-Ca-iron-minerals Tab Take 1 tablet by mouth.     ??? SUMAtriptan (IMITREX) 100 MG tablet Take 100 mg by mouth every two (2) hours as needed.      ??? traZODone (DESYREL) 50 MG tablet TAKE 0.5-1 TABLETS (25-50 MG TOTAL) BY MOUTH AT BEDTIME AS NEEDED FOR SLEEP.  3     No current facility-administered medications for this visit.         Allergies   Allergen Reactions   ??? Penicillins Hives     Social History     Socioeconomic History   ??? Marital status: Single     Spouse name: Not on file   ??? Number of children: Not on file   ??? Years of education: Not on file   ??? Highest education level: Not on file   Occupational History   ??? Not on file   Tobacco Use   ??? Smoking status: Never Smoker   ??? Smokeless tobacco: Never Used   Substance and Sexual Activity   ??? Alcohol use: No   ??? Drug use: No   ??? Sexual activity: Not on file   Other Topics Concern   ??? Not on file   Social History Narrative    She lives by herself and 2 children, ages 68     and 93. She has not worked since 01/2011. She used to work as a     Interior and spatial designer. Substance abuse, denies tobacco, alcohol or illicit     drug use.                  Social Determinants of Health     Financial Resource Strain:    ??? Difficulty of Paying Living Expenses:    Food Insecurity:    ??? Worried About Programme researcher, broadcasting/film/video in the Last Year:    ??? Barista in the Last Year:    Transportation Needs:    ??? Freight forwarder (Medical):    ??? Lack of Transportation (Non-Medical):    Physical Activity:    ??? Days of Exercise per Week:    ??? Minutes  of Exercise per Session:    Stress:    ??? Feeling of Stress :    Social Connections:    ??? Frequency of Communication with Friends and Family:    ??? Frequency of Social Gatherings with Friends and Family:    ??? Attends Religious Services:    ??? Database administrator or Organizations:    ??? Attends Banker Meetings:    ??? Marital Status:

## 2020-05-20 NOTE — Unmapped (Signed)
**  Follow up 6 weeks.      [facility] is a specialty clinic and there is a need for you to have a  primary care provider  who will take care of your non-neurological health.   ..................................................................................................................    COVID vaccine guidance in patients with MS and related diseases:  [email or web address]  .....................................................................................................................         Kaziah Krizek Elvera Bicker PA-C, [facility], Department of Neurology /  Multiple Sclerosis Division    [address]    [place], [place] [address]    Phone: [phone]   Fax: [phone]     ?? In case of:  ?? a suspected relapse (new symptoms or worsening existing symptoms, lasting for >24h)  OR  ?? a need for an additional appointment for other reasons  OR   ?? if you have other questions: please contact:       [facility] Desk   Phone: [phone]      ?? If you have questions for our  pharmacist, please call:      Worthy Flank, PharmD, CPP  Phone: [phone]      ?? If you need financial assistance, please call:      Financial Assistance Unit      Phone: [phone]

## 2020-05-21 DIAGNOSIS — G35 Multiple sclerosis: Secondary | ICD-10-CM | POA: Diagnosis not present

## 2020-05-22 DIAGNOSIS — G35 Multiple sclerosis: Secondary | ICD-10-CM | POA: Diagnosis not present

## 2020-05-23 DIAGNOSIS — G35 Multiple sclerosis: Secondary | ICD-10-CM | POA: Diagnosis not present

## 2020-05-24 DIAGNOSIS — G35 Multiple sclerosis: Secondary | ICD-10-CM | POA: Diagnosis not present

## 2020-05-25 DIAGNOSIS — G35 Multiple sclerosis: Secondary | ICD-10-CM | POA: Diagnosis not present

## 2020-05-26 DIAGNOSIS — G35 Multiple sclerosis: Secondary | ICD-10-CM | POA: Diagnosis not present

## 2020-05-27 DIAGNOSIS — G35 Multiple sclerosis: Secondary | ICD-10-CM | POA: Diagnosis not present

## 2020-05-28 DIAGNOSIS — D72819 Decreased white blood cell count, unspecified: Secondary | ICD-10-CM | POA: Diagnosis not present

## 2020-05-28 DIAGNOSIS — G35 Multiple sclerosis: Secondary | ICD-10-CM | POA: Diagnosis not present

## 2020-05-29 DIAGNOSIS — G35 Multiple sclerosis: Secondary | ICD-10-CM | POA: Diagnosis not present

## 2020-05-29 LAB — CBC WITH DIFFERENTIAL/PLATELET
Absolute Monocytes: 364 cells/uL (ref 200–950)
Basophils Absolute: 21 cells/uL (ref 0–200)
Basophils Relative: 0.8 %
Eosinophils Absolute: 39 cells/uL (ref 15–500)
Eosinophils Relative: 1.5 %
HCT: 37.6 % (ref 35.0–45.0)
Hemoglobin: 12.3 g/dL (ref 11.7–15.5)
Lymphs Abs: 1084 cells/uL (ref 850–3900)
MCH: 27.4 pg (ref 27.0–33.0)
MCHC: 32.7 g/dL (ref 32.0–36.0)
MCV: 83.7 fL (ref 80.0–100.0)
MPV: 11.1 fL (ref 7.5–12.5)
Monocytes Relative: 14 %
Neutro Abs: 1092 cells/uL — ABNORMAL LOW (ref 1500–7800)
Neutrophils Relative %: 42 %
Platelets: 266 10*3/uL (ref 140–400)
RBC: 4.49 10*6/uL (ref 3.80–5.10)
RDW: 12.8 % (ref 11.0–15.0)
Total Lymphocyte: 41.7 %
WBC: 2.6 10*3/uL — ABNORMAL LOW (ref 3.8–10.8)

## 2020-05-29 NOTE — Unmapped (Unsigned)
Patient was taken off tecfidera last week by doctor

## 2020-05-29 NOTE — Unmapped (Signed)
This patient has been disenrolled from the [facility] specialty pharmacy services due to medication discontinuation resulting from side effect intolerance.    Hannah Barr  [facility] Specialty Pharmacist

## 2020-05-30 DIAGNOSIS — G35 Multiple sclerosis: Secondary | ICD-10-CM | POA: Diagnosis not present

## 2020-05-31 ENCOUNTER — Other Ambulatory Visit: Payer: Self-pay | Admitting: Family Medicine

## 2020-05-31 DIAGNOSIS — G35 Multiple sclerosis: Secondary | ICD-10-CM | POA: Diagnosis not present

## 2020-05-31 DIAGNOSIS — G43009 Migraine without aura, not intractable, without status migrainosus: Secondary | ICD-10-CM

## 2020-05-31 NOTE — Telephone Encounter (Signed)
Requested Prescriptions  Pending Prescriptions Disp Refills  . SUMAtriptan (IMITREX) 100 MG tablet [Pharmacy Med Name: SUMATRIPTAN SUCC 100 MG TABLET] 9 tablet 1    Sig: TAKE 1 TABLET (100 MG TOTAL) BY MOUTH EVERY 2 (TWO) HOURS AS NEEDED FOR MIGRAINE. MAY REPEAT IN 2 HOURS IF HEADACHE PERSISTS OR RECURS.     Neurology:  Migraine Therapy - Triptan Failed - 05/31/2020  9:52 AM      Failed - Last BP in normal range    BP Readings from Last 1 Encounters:  04/21/20 140/90         Passed - Valid encounter within last 12 months    Recent Outpatient Visits          1 month ago Other neutropenia [facility])   [facility] Steele Sizer, MD   6 months ago Productive cough   [facility] Steele Sizer, MD   8 months ago Breakthrough bleeding   [facility] Steele Sizer, MD   9 months ago Acute non-recurrent maxillary sinusitis   [facility], Drue Stager, MD   9 months ago Migraine without aura and without status migrainosus, not intractable   [facility] Steele Sizer, MD      Future Appointments            In 1 month Ancil Boozer, Drue Stager, MD [facility], [facility]

## 2020-06-01 DIAGNOSIS — G35 Multiple sclerosis: Secondary | ICD-10-CM | POA: Diagnosis not present

## 2020-06-02 DIAGNOSIS — G35 Multiple sclerosis: Secondary | ICD-10-CM | POA: Diagnosis not present

## 2020-06-03 DIAGNOSIS — G35 Multiple sclerosis: Secondary | ICD-10-CM | POA: Diagnosis not present

## 2020-06-04 DIAGNOSIS — G35 Multiple sclerosis: Secondary | ICD-10-CM | POA: Diagnosis not present

## 2020-06-05 DIAGNOSIS — G35 Multiple sclerosis: Secondary | ICD-10-CM | POA: Diagnosis not present

## 2020-06-06 DIAGNOSIS — G35 Multiple sclerosis: Secondary | ICD-10-CM | POA: Diagnosis not present

## 2020-06-07 DIAGNOSIS — G35 Multiple sclerosis: Secondary | ICD-10-CM | POA: Diagnosis not present

## 2020-06-08 DIAGNOSIS — G35 Multiple sclerosis: Secondary | ICD-10-CM | POA: Diagnosis not present

## 2020-06-09 DIAGNOSIS — G35 Multiple sclerosis: Secondary | ICD-10-CM | POA: Diagnosis not present

## 2020-06-10 DIAGNOSIS — G35 Multiple sclerosis: Secondary | ICD-10-CM | POA: Diagnosis not present

## 2020-06-11 DIAGNOSIS — G35 Multiple sclerosis: Secondary | ICD-10-CM | POA: Diagnosis not present

## 2020-06-12 DIAGNOSIS — G35 Multiple sclerosis: Secondary | ICD-10-CM | POA: Diagnosis not present

## 2020-06-13 DIAGNOSIS — G35 Multiple sclerosis: Secondary | ICD-10-CM | POA: Diagnosis not present

## 2020-06-14 DIAGNOSIS — G35 Multiple sclerosis: Secondary | ICD-10-CM | POA: Diagnosis not present

## 2020-06-15 DIAGNOSIS — G35 Multiple sclerosis: Secondary | ICD-10-CM | POA: Diagnosis not present

## 2020-06-16 DIAGNOSIS — G35 Multiple sclerosis: Secondary | ICD-10-CM | POA: Diagnosis not present

## 2020-06-17 DIAGNOSIS — G35 Multiple sclerosis: Secondary | ICD-10-CM | POA: Diagnosis not present

## 2020-06-18 DIAGNOSIS — G35 Multiple sclerosis: Secondary | ICD-10-CM | POA: Diagnosis not present

## 2020-06-19 DIAGNOSIS — G35 Multiple sclerosis: Secondary | ICD-10-CM | POA: Diagnosis not present

## 2020-06-20 DIAGNOSIS — G35 Multiple sclerosis: Secondary | ICD-10-CM | POA: Diagnosis not present

## 2020-06-21 DIAGNOSIS — G35 Multiple sclerosis: Secondary | ICD-10-CM | POA: Diagnosis not present

## 2020-06-22 DIAGNOSIS — G35 Multiple sclerosis: Secondary | ICD-10-CM | POA: Diagnosis not present

## 2020-06-23 DIAGNOSIS — G35 Multiple sclerosis: Secondary | ICD-10-CM | POA: Diagnosis not present

## 2020-06-24 DIAGNOSIS — G35 Multiple sclerosis: Secondary | ICD-10-CM | POA: Diagnosis not present

## 2020-06-25 DIAGNOSIS — G35 Multiple sclerosis: Secondary | ICD-10-CM | POA: Diagnosis not present

## 2020-06-26 DIAGNOSIS — G35 Multiple sclerosis: Secondary | ICD-10-CM | POA: Diagnosis not present

## 2020-06-27 DIAGNOSIS — G35 Multiple sclerosis: Secondary | ICD-10-CM | POA: Diagnosis not present

## 2020-06-28 DIAGNOSIS — G35 Multiple sclerosis: Secondary | ICD-10-CM | POA: Diagnosis not present

## 2020-06-29 DIAGNOSIS — G35 Multiple sclerosis: Secondary | ICD-10-CM | POA: Diagnosis not present

## 2020-06-30 DIAGNOSIS — G35 Multiple sclerosis: Secondary | ICD-10-CM | POA: Diagnosis not present

## 2020-07-01 DIAGNOSIS — G35 Multiple sclerosis: Secondary | ICD-10-CM | POA: Diagnosis not present

## 2020-07-02 DIAGNOSIS — G35 Multiple sclerosis: Secondary | ICD-10-CM | POA: Diagnosis not present

## 2020-07-03 DIAGNOSIS — G35 Multiple sclerosis: Secondary | ICD-10-CM | POA: Diagnosis not present

## 2020-07-04 DIAGNOSIS — G35 Multiple sclerosis: Secondary | ICD-10-CM | POA: Diagnosis not present

## 2020-07-05 DIAGNOSIS — G35 Multiple sclerosis: Secondary | ICD-10-CM | POA: Diagnosis not present

## 2020-07-06 DIAGNOSIS — G35 Multiple sclerosis: Secondary | ICD-10-CM | POA: Diagnosis not present

## 2020-07-07 DIAGNOSIS — G35 Multiple sclerosis: Secondary | ICD-10-CM | POA: Diagnosis not present

## 2020-07-08 ENCOUNTER — Ambulatory Visit
Admit: 2020-07-08 | Discharge: 2020-07-09 | Payer: MEDICARE | Attending: Physician Assistant | Primary: Physician Assistant

## 2020-07-08 DIAGNOSIS — G35 Multiple sclerosis: Secondary | ICD-10-CM | POA: Diagnosis not present

## 2020-07-09 DIAGNOSIS — G35 Multiple sclerosis: Secondary | ICD-10-CM | POA: Diagnosis not present

## 2020-07-11 ENCOUNTER — Telehealth: Payer: Self-pay | Admitting: Family Medicine

## 2020-07-11 NOTE — Telephone Encounter (Signed)
PATIENT STATES WENT TO [facility] AND HAD BLOOD WORK DONE AND GOT RESULTS BACK AND WAS POSITIVE

## 2020-07-11 NOTE — Telephone Encounter (Signed)
Please call pt. with information about when she needs to come in for TB tx.

## 2020-07-13 ENCOUNTER — Other Ambulatory Visit: Payer: Self-pay | Admitting: Family Medicine

## 2020-07-13 NOTE — Telephone Encounter (Signed)
Requested Prescriptions  Pending Prescriptions Disp Refills  . cloNIDine (CATAPRES) 0.1 MG tablet [Pharmacy Med Name: CLONIDINE HCL 0.1 MG TABLET] 90 tablet 1    Sig: TAKE 1 TABLET (0.1 MG TOTAL) BY MOUTH AT BEDTIME. BLOOD PRESSURE AND HOT FLASHES     Cardiovascular:  Alpha-2 Agonists Failed - 07/13/2020  9:14 AM      Failed - Last BP in normal range    BP Readings from Last 1 Encounters:  04/21/20 140/90         Passed - Last Heart Rate in normal range    Pulse Readings from Last 1 Encounters:  04/21/20 (!) 102         Passed - Valid encounter within last 6 months    Recent Outpatient Visits          2 months ago Other neutropenia [facility])   [facility] Steele Sizer, MD   8 months ago Productive cough   [facility] Steele Sizer, MD   9 months ago Breakthrough bleeding   [facility] Steele Sizer, MD   10 months ago Acute non-recurrent maxillary sinusitis   [facility], Drue Stager, MD   11 months ago Migraine without aura and without status migrainosus, not intractable   [facility] Steele Sizer, MD      Future Appointments            In 1 week Steele Sizer, MD [facility], [facility]

## 2020-07-15 ENCOUNTER — Ambulatory Visit
Admission: RE | Admit: 2020-07-15 | Discharge: 2020-07-15 | Disposition: A | Discharge status: Home / Self Care | Payer: Medicare HMO | Attending: Family Medicine | Admitting: Family Medicine

## 2020-07-15 ENCOUNTER — Other Ambulatory Visit: Payer: Self-pay | Admitting: Family Medicine

## 2020-07-15 ENCOUNTER — Ambulatory Visit
Admission: RE | Admit: 2020-07-15 | Discharge: 2020-07-15 | Disposition: A | Discharge status: Home / Self Care | Payer: Medicare HMO | Source: Ambulatory Visit | Attending: Family Medicine | Admitting: Family Medicine

## 2020-07-15 ENCOUNTER — Other Ambulatory Visit: Payer: Self-pay

## 2020-07-15 ENCOUNTER — Ambulatory Visit (LOCAL_COMMUNITY_HEALTH_CENTER): Payer: Self-pay

## 2020-07-15 VITALS — Wt 174.8 lb

## 2020-07-15 DIAGNOSIS — R7612 Nonspecific reaction to cell mediated immunity measurement of gamma interferon antigen response 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 tuberculosis: Secondary | ICD-10-CM

## 2020-07-15 DIAGNOSIS — R7611 Nonspecific reaction to tuberculin skin test 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 tuberculosis: Secondary | ICD-10-CM | POA: Diagnosis not present

## 2020-07-15 DIAGNOSIS — G35 Multiple sclerosis: Secondary | ICD-10-CM | POA: Diagnosis not present

## 2020-07-16 ENCOUNTER — Other Ambulatory Visit: Payer: Self-pay | Admitting: Family Medicine

## 2020-07-16 ENCOUNTER — Telehealth: Payer: Self-pay

## 2020-07-16 DIAGNOSIS — R7612 Nonspecific reaction to cell mediated immunity measurement of gamma interferon antigen response 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 tuberculosis: Secondary | ICD-10-CM | POA: Insufficient documentation

## 2020-07-16 DIAGNOSIS — G35 Multiple sclerosis: Secondary | ICD-10-CM | POA: Diagnosis not present

## 2020-07-16 LAB — HEPATIC FUNCTION PANEL
ALT: 23 IU/L (ref 0–32)
AST: 16 IU/L (ref 0–40)
Albumin: 4.4 g/dL (ref 3.8–4.8)
Alkaline Phosphatase: 43 IU/L — ABNORMAL LOW (ref 48–121)
Bilirubin Total: 0.4 mg/dL (ref 0.0–1.2)
Bilirubin, Direct: 0.11 mg/dL (ref 0.00–0.40)
Total Protein: 6.8 g/dL (ref 6.0–8.5)

## 2020-07-16 NOTE — Progress Notes (Signed)
Tuberculosis treatment orders  All patients are to be monitored per [place] and county TB policies.   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 Treat for latent TB per the following:  Rifampin 600mg  daily by mouth x 4 months, draw LFTs monthly per Dr. Ernestina Patches.    +QFT and CBC w/ diff in Care Everywhere on 07/08/20.  LFTs drawn 07/15/20 CXR without Active TB 07/15/20.  HIV pending.

## 2020-07-16 NOTE — Telephone Encounter (Signed)
TC with patient.  Discussed normal CXR. Patient wishes to start LTBI tx.  Rifampin orders sent to [facility] and patient scheduled for 07/21/20 Aileen Fass, RN

## 2020-07-17 DIAGNOSIS — G35 Multiple sclerosis: Secondary | ICD-10-CM | POA: Diagnosis not present

## 2020-07-18 DIAGNOSIS — G35 Multiple sclerosis: Secondary | ICD-10-CM | POA: Diagnosis not present

## 2020-07-18 NOTE — Progress Notes (Signed)
+  QFT at [facility] 07/08/20. Patient had CBC w/ platelets 07/08/20 also but no LFTs.  Discussed LTBI vs Active TB. Reports has MS and would like to proceed with LTBI tx. Sent for CXR. Aileen Fass, RN

## 2020-07-19 DIAGNOSIS — G35 Multiple sclerosis: Secondary | ICD-10-CM | POA: Diagnosis not present

## 2020-07-20 DIAGNOSIS — G35 Multiple sclerosis: Secondary | ICD-10-CM | POA: Diagnosis not present

## 2020-07-21 ENCOUNTER — Ambulatory Visit (LOCAL_COMMUNITY_HEALTH_CENTER): Payer: Self-pay

## 2020-07-21 ENCOUNTER — Other Ambulatory Visit: Payer: Self-pay

## 2020-07-21 VITALS — Wt 174.5 lb

## 2020-07-21 DIAGNOSIS — R7612 Nonspecific reaction to cell mediated immunity measurement of gamma interferon antigen response 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 tuberculosis: Secondary | ICD-10-CM

## 2020-07-21 DIAGNOSIS — G35 Multiple sclerosis: Secondary | ICD-10-CM | POA: Diagnosis not present

## 2020-07-21 MED ORDER — RIFAMPIN 300 MG PO CAPS: 600.0000 mg | ORAL_CAPSULE | Freq: Every day | ORAL | 0 refills | Status: AC

## 2020-07-21 NOTE — Progress Notes (Addendum)
Here for LTBI treatment- Rifampin Bottle #1. LTBI consent signed, ROI consent signed. Rifampin info sheet, TB Coord. Card, LTBI info sheet given and explained. PCP Dr. Arm[facility]. PCP letter mailed  to [facility]. Fax was attempted but failed to send.  Pt reports she is taking [place]oli SuperFruits, Elderberry, and Ashwagandha daily. Rifampin 300mg  #60 dispensed today per order by K. Ernestina Patches, MD dated 07/17/2020 with instructions for pt to take 2 capsules by mouth each day. Pt instructed to bring bottle back to her next appt which is due on 08/18/2020. Pt sent to clerk to schedule next appt. LFT's to be done at next appt. Questions answered. Josie Saunders, RN

## 2020-07-22 DIAGNOSIS — G35 Multiple sclerosis: Secondary | ICD-10-CM | POA: Diagnosis not present

## 2020-07-23 DIAGNOSIS — G35 Multiple sclerosis: Secondary | ICD-10-CM | POA: Diagnosis not present

## 2020-07-24 ENCOUNTER — Ambulatory Visit: Payer: Medicare HMO | Admitting: Family Medicine

## 2020-07-25 ENCOUNTER — Ambulatory Visit: Payer: Medicare HMO | Admitting: Family Medicine

## 2020-07-29 DIAGNOSIS — G35 Multiple sclerosis: Secondary | ICD-10-CM | POA: Diagnosis not present

## 2020-07-30 DIAGNOSIS — G35 Multiple sclerosis: Secondary | ICD-10-CM | POA: Diagnosis not present

## 2020-07-31 ENCOUNTER — Encounter: Payer: Self-pay | Admitting: Family Medicine

## 2020-07-31 DIAGNOSIS — G35 Multiple sclerosis: Secondary | ICD-10-CM | POA: Diagnosis not present

## 2020-08-01 DIAGNOSIS — G35 Multiple sclerosis: Secondary | ICD-10-CM | POA: Diagnosis not present

## 2020-08-02 DIAGNOSIS — G35 Multiple sclerosis: Secondary | ICD-10-CM | POA: Diagnosis not present

## 2020-08-03 DIAGNOSIS — G35 Multiple sclerosis: Secondary | ICD-10-CM | POA: Diagnosis not present

## 2020-08-04 DIAGNOSIS — G35 Multiple sclerosis: Secondary | ICD-10-CM | POA: Diagnosis not present

## 2020-08-05 DIAGNOSIS — G35 Multiple sclerosis: Secondary | ICD-10-CM | POA: Diagnosis not present

## 2020-08-06 DIAGNOSIS — G35 Multiple sclerosis: Secondary | ICD-10-CM | POA: Diagnosis not present

## 2020-08-07 ENCOUNTER — Telehealth
Admit: 2020-08-07 | Discharge: 2020-08-08 | Payer: MEDICARE | Attending: Physician Assistant | Primary: Physician Assistant

## 2020-08-07 DIAGNOSIS — G35 Multiple sclerosis: Secondary | ICD-10-CM | POA: Diagnosis not present

## 2020-08-08 DIAGNOSIS — G35 Multiple sclerosis: Secondary | ICD-10-CM | POA: Diagnosis not present

## 2020-08-09 DIAGNOSIS — G35 Multiple sclerosis: Secondary | ICD-10-CM | POA: Diagnosis not present

## 2020-08-10 DIAGNOSIS — G35 Multiple sclerosis: Secondary | ICD-10-CM | POA: Diagnosis not present

## 2020-08-11 DIAGNOSIS — G35 Multiple sclerosis: Secondary | ICD-10-CM | POA: Diagnosis not present

## 2020-08-12 DIAGNOSIS — G35 Multiple sclerosis: Secondary | ICD-10-CM | POA: Diagnosis not present

## 2020-08-13 DIAGNOSIS — G35 Multiple sclerosis: Secondary | ICD-10-CM | POA: Diagnosis not present

## 2020-08-14 DIAGNOSIS — G35 Multiple sclerosis: Secondary | ICD-10-CM | POA: Diagnosis not present

## 2020-08-15 DIAGNOSIS — G35 Multiple sclerosis: Secondary | ICD-10-CM | POA: Diagnosis not present

## 2020-08-16 DIAGNOSIS — G35 Multiple sclerosis: Secondary | ICD-10-CM | POA: Diagnosis not present

## 2020-08-17 DIAGNOSIS — G35 Multiple sclerosis: Secondary | ICD-10-CM | POA: Diagnosis not present

## 2020-08-18 DIAGNOSIS — G35 Multiple sclerosis: Secondary | ICD-10-CM | POA: Diagnosis not present

## 2020-08-19 DIAGNOSIS — G35 Multiple sclerosis: Secondary | ICD-10-CM | POA: Diagnosis not present

## 2020-08-20 DIAGNOSIS — G35 Multiple sclerosis: Secondary | ICD-10-CM | POA: Diagnosis not present

## 2020-08-21 DIAGNOSIS — G35 Multiple sclerosis: Secondary | ICD-10-CM | POA: Diagnosis not present

## 2020-08-22 DIAGNOSIS — G35 Multiple sclerosis: Secondary | ICD-10-CM | POA: Diagnosis not present

## 2020-08-23 DIAGNOSIS — G35 Multiple sclerosis: Secondary | ICD-10-CM | POA: Diagnosis not present

## 2020-08-24 DIAGNOSIS — G35 Multiple sclerosis: Secondary | ICD-10-CM | POA: Diagnosis not present

## 2020-08-25 DIAGNOSIS — G35 Multiple sclerosis: Secondary | ICD-10-CM | POA: Diagnosis not present

## 2020-08-26 ENCOUNTER — Ambulatory Visit (INDEPENDENT_AMBULATORY_CARE_PROVIDER_SITE_OTHER): Payer: Medicare HMO

## 2020-08-26 ENCOUNTER — Other Ambulatory Visit: Payer: Self-pay

## 2020-08-26 ENCOUNTER — Ambulatory Visit
Admission: EM | Admit: 2020-08-26 | Discharge: 2020-08-26 | Disposition: A | Discharge status: Home / Self Care | Payer: Medicare HMO | Attending: Physician Assistant | Admitting: Physician Assistant

## 2020-08-26 ENCOUNTER — Encounter: Payer: Self-pay | Admitting: Emergency Medicine

## 2020-08-26 ENCOUNTER — Ambulatory Visit: Payer: Self-pay | Admitting: *Deleted

## 2020-08-26 ENCOUNTER — Telehealth: Payer: Self-pay

## 2020-08-26 DIAGNOSIS — K219 Gastro-esophageal reflux disease without esophagitis: Secondary | ICD-10-CM

## 2020-08-26 DIAGNOSIS — R079 Chest pain, unspecified: Secondary | ICD-10-CM

## 2020-08-26 DIAGNOSIS — G35 Multiple sclerosis: Secondary | ICD-10-CM | POA: Diagnosis not present

## 2020-08-26 MED ORDER — LIDOCAINE VISCOUS HCL 2 % MT SOLN
15.0000 mL | Freq: Once | OROMUCOSAL | Status: AC
  Administered 2020-08-26: 15 mL via ORAL

## 2020-08-26 MED ORDER — SUCRALFATE 1 GM/10ML PO SUSP: 1.0000 g | Freq: Four times a day (QID) | ORAL | 0 refills | Status: DC

## 2020-08-26 MED ORDER — KETOROLAC TROMETHAMINE 60 MG/2ML IM SOLN
60.0000 mg | Freq: Once | INTRAMUSCULAR | Status: AC
  Administered 2020-08-26: 60 mg via INTRAMUSCULAR

## 2020-08-26 MED ORDER — ALUM & MAG HYDROXIDE-SIMETH 200-200-20 MG/5ML PO SUSP
30.0000 mL | Freq: Once | ORAL | Status: AC
  Administered 2020-08-26: 30 mL via ORAL

## 2020-08-26 NOTE — ED Triage Notes (Signed)
Patient refused covid test

## 2020-08-26 NOTE — Telephone Encounter (Signed)
  Patient is calling to report chest pain for 2 days- with throat burning- similar to reflux symptoms- but worse that she has had in the past. No appointment available in office- patient is gong to UC for evaluation. Reason for Disposition . [1] Chest pain lasts > 5 minutes AND [2] occurred in past 3 days (72 hours) (Exception: feels exactly the same as previously diagnosed heartburn and has accompanying sour taste in mouth)    Patient does have GERD history- does not take medications daily- orange juice made worse yesterday- advised UC- no appointment s available in office today  Answer Assessment - Initial Assessment Questions 1. LOCATION: "Where does it hurt?"       Middle of chest cavity 2. RADIATION: "Does the pain go anywhere else?" (e.g., into neck, jaw, arms, back)     Pain into throat 3. ONSET: "When did the chest pain begin?" (Minutes, hours or days)      2 days 4. PATTERN "Does the pain come and go, or has it been constant since it started?"  "Does it get worse with exertion?"      Comes and goes- this morning constant- took prilosec 5. DURATION: "How long does it last" (e.g., seconds, minutes, hours)     hours 6. SEVERITY: "How bad is the pain?"  (e.g., Scale 1-10; mild, moderate, or severe)    - MILD (1-3): doesn't interfere with normal activities     - MODERATE (4-7): interferes with normal activities or awakens from sleep    - SEVERE (8-10): excruciating pain, unable to do any normal activities       Moderate-4 7. CARDIAC RISK FACTORS: "Do you have any history of heart problems or risk factors for heart disease?" (e.g., angina, prior heart attack; diabetes, high blood pressure, high cholesterol, smoker, or strong family history of heart disease)     no 8. PULMONARY RISK FACTORS: "Do you have any history of lung disease?"  (e.g., blood clots in lung, asthma, emphysema, birth control pills)     no 9. CAUSE: "What do you think is causing the chest pain?"     GERD- Prilosec  only 10. OTHER SYMPTOMS: "Do you have any other symptoms?" (e.g., dizziness, nausea, vomiting, sweating, fever, difficulty breathing, cough)      Pian in throat- especially when breathing, orange juice made it worse 11. PREGNANCY: "Is there any chance you are pregnant?" "When was your last menstrual period?"       no  Protocols used: CHEST PAIN-A-AH

## 2020-08-26 NOTE — ED Triage Notes (Signed)
Patient c/o upper chest pain with deep inspiration. She reports a history of GERD and has taken her Omeprazole but states it is not working. She states this has been going on for about 3 days.

## 2020-08-26 NOTE — Telephone Encounter (Signed)
TC with patient to r/s next Rifampin appt. Patient reports never opened the bottle of Rifampin.  Stated after speaking with neurologist she feels she needs a retest (QFT).  TB RN offered retest appt at [facility]; pt states will go to PCP for retest.  TB RN will f/u with patient and proceed from there. Aileen Fass, RN

## 2020-08-26 NOTE — Discharge Instructions (Addendum)
Your EKG and chest x-ray are normal today.  Your chest pain is likely due to a combination of GERD and musculoskeletal chest pain.  You have been given an injection of ketorolac, which is an anti-inflammatory/pain reliever in the clinic today.  We also tried a GI cocktail.  You should increase your omeprazole to 40 mg a day.  I am prescribing Carafate also to help with reflux.  Take your meloxicam daily as well.  You can try your at home cyclobenzaprine to see if that helps.  You can also try Tylenol for pain relief.  Please follow-up with your PCP.  Your blood pressure is elevated so you should keep a log of that and follow-up with PCP if blood pressure continues to be over 140/90.  If your chest pain worsens or you have weakness, dizziness, vomiting, sweats, palpitations, or any new worsening symptoms she should call EMS or have someone take you to the emergency room.

## 2020-08-26 NOTE — ED Provider Notes (Signed)
[facility]    CSN: [id number] Arrival date & time: 08/26/20  1511      History   Chief Complaint Chief Complaint  Patient presents with  . Chest Pain  . Gastroesophageal Reflux    HPI Tammie Lopez is a 49 y.o. female presenting for 3-day history of upper chest pain when she breathes deeply or extends her back.  She says the pain feels like an aching pain.  She denies any associated fever, fatigue, chills, sweats, headaches, dizziness, nausea, vomiting, shortness of breath, palpitations, sweats, leg swelling or pain, abdominal pain, back pain.  She says the chest pain seems like it radiates up her throat and neck at times.  Patient admits to a history of acid reflux and says she took omeprazole which "calmed down a little," but did not completely take the pain away.  Patient denies any history of asthma, blood clotting disorder/PE, MI or cardiovascular conditions.  She does have a history of multiple sclerosis and migraines.  Patient has not tried anything else for symptoms.  She has no known Covid exposure and has been fully vaccinated for Covid.  No other complaints or concerns today.  HPI  Past Medical History:  Diagnosis Date  . Allergy   . Constipation   . Diverticula of colon   . Insomnia   . Migraine   . MS (multiple sclerosis) ([place])   . Muscle spasm     Patient Active Problem List   Diagnosis Date Noted  . 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 Low TSH level 09/19/2019  . Obesity (BMI 30-39.9) 12/06/2017  . Insulin resistance 09/27/2017  . Essential hypertension 09/21/2017  . Right sided sciatica 09/21/2017  . Major depression, recurrent ([place]) 05/24/2017  . Migraine without aura, not intractable 12/09/2016  . Nickel allergy 12/09/2016  . GERD without esophagitis 12/09/2016  . Other neutropenia ([place]) 12/09/2016  . Dyslipidemia 12/09/2016  . Diverticulosis 12/09/2016  . 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  Allergic rhinitis, seasonal 12/09/2016  . Chronic constipation 12/09/2016  . Vitamin D deficiency 08/06/2013  . MS (multiple sclerosis) ([place]) 04/17/2013    Past Surgical History:  Procedure Laterality Date  . BREAST BIOPSY Left 01/06/2018   neg  . BREAST BIOPSY Left 01/06/2018   neg  . BUNIONECTOMY    . CESAREAN SECTION N/A 2006  . GANGLION CYST EXCISION Bilateral    Hands    OB History   No obstetric history on file.      Home Medications    Prior to Admission medications   Medication Sig Start Date End Date Taking? Authorizing Provider  LINZESS 290 MCG CAPS capsule TAKE 1 CAPSULE (290 MCG TOTAL) BY MOUTH DAILY. 04/07/20  Yes Sowles, Drue Stager, MD  omeprazole (PRILOSEC) 40 MG capsule TAKE 1 CAPSULE BY MOUTH EVERY DAY 03/02/20  Yes Sowles, Drue Stager, MD  SUMAtriptan (IMITREX) 100 MG tablet TAKE 1 TABLET (100 MG TOTAL) BY MOUTH EVERY 2 (TWO) HOURS AS NEEDED FOR MIGRAINE. MAY REPEAT IN 2 HOURS IF HEADACHE PERSISTS OR RECURS. 05/31/20  Yes Sowles, Drue Stager, MD  atenolol-chlorthalidone (TENORETIC) 50-25 MG tablet TAKE 0.5 TABLETS BY MOUTH IN THE MORNING AND AT BEDTIME. 05/15/20   Steele Sizer, MD  Cholecalciferol 100 MCG (4000 UT) CAPS Take by mouth. Patient not taking: Reported on 07/21/2020 06/15/18   [provider]  cloNIDine (CATAPRES) 0.1 MG tablet TAKE 1 TABLET (0.1 MG TOTAL) BY MOUTH AT BEDTIME. BLOOD PRESSURE AND HOT FLASHES Patient not taking: Reported  on 07/21/2020 07/13/20   Steele Sizer, MD  cyclobenzaprine (FLEXERIL) 10 MG tablet Take by mouth. Patient not taking: Reported on 07/21/2020 10/10/19   [provider]  diclofenac sodium (VOLTAREN) 1 % GEL APPLY 4 G TOPICALLY 4 (FOUR) TIMES DAILY. Patient not taking: Reported on 07/21/2020 08/07/19   Steele Sizer, MD  fluticasone (FLONASE) 50 MCG/ACT nasal spray SPRAY 2 SPRAYS INTO EACH NOSTRIL EVERY DAY Patient not taking: SPRAY 2 SPRAYS INTO EACH NOSTRIL EVERY DAY 05/15/20   Steele Sizer, MD  Multiple Vitamin  (MULTIVITAMIN) tablet Take 1 tablet by mouth daily.    [provider]  prednisoLONE acetate (PRED FORTE) 1 % ophthalmic suspension  04/16/20   [provider]  sucralfate (CARAFATE) 1 GM/10ML suspension Take 10 mLs (1 g total) by mouth 4 (four) times daily for 7 days. 08/26/20 09/02/20  Danton Clap, PA-C    Family History Family History  Problem Relation Age of Onset  . Diabetes Mother   . Hypertension Mother   . Cancer Father 75       Mouth  . COPD Father   . Hypertension Brother   . Diabetes Brother     Social History Social History   Tobacco Use  . Smoking status: Never Smoker  . Smokeless tobacco: Never Used  . Tobacco comment: smoking cessation materials not required  Vaping Use  . Vaping Use: Never used  Substance Use Topics  . Alcohol use: No  . Drug use: No     Allergies   Penicillins   Review of Systems Review of Systems  Constitutional: Negative for chills, diaphoresis, fatigue and fever.  HENT: Negative for congestion, ear pain, rhinorrhea, sinus pressure, sinus pain and sore throat.   Respiratory: Negative for cough, shortness of breath and wheezing.   Cardiovascular: Positive for chest pain. Negative for palpitations and leg swelling.  Gastrointestinal: Negative for abdominal pain, nausea and vomiting.  Musculoskeletal: Negative for arthralgias and myalgias.  Skin: Negative for rash.  Neurological: Negative for dizziness, syncope, weakness and headaches.  Hematological: Negative for adenopathy.     Physical Exam Triage Vital Signs ED Triage Vitals  Enc Vitals Group     BP 08/26/20 1531 (!) 155/106     Pulse Rate 08/26/20 1531 72     Resp 08/26/20 1531 18     Temp 08/26/20 1531 98 F (36.7 C)     Temp Source 08/26/20 1531 Oral     SpO2 08/26/20 1531 100 %     Weight 08/26/20 1529 172 lb (78 kg)     Height 08/26/20 1529 5\' 2"  (1.575 m)     Head Circumference --      Peak Flow --      Pain Score 08/26/20 1529 7     Pain  Loc --      Pain Edu? --      Excl. in [place]? --    No data found.  Updated Vital Signs BP (!) 164/102 (BP Location: Left Arm)   Pulse 72   Temp 98 F (36.7 C) (Oral)   Resp 18   Ht 5\' 2"  (1.575 m)   Wt 172 lb (78 kg)   LMP 08/25/2020   SpO2 100%   BMI 31.46 kg/m       Physical Exam Vitals and nursing note reviewed.  Constitutional:      General: She is not in acute distress.    Appearance: Normal appearance. She is not ill-appearing or toxic-appearing.  HENT:  Head: Normocephalic and atraumatic.     Nose: Nose normal.     Mouth/Throat:     Mouth: Mucous membranes are moist.     Pharynx: Oropharynx is clear.  Eyes:     General: No scleral icterus.       Right eye: No discharge.        Left eye: No discharge.     Conjunctiva/sclera: Conjunctivae normal.  Cardiovascular:     Rate and Rhythm: Normal rate and regular rhythm.     Heart sounds: Normal heart sounds.  Pulmonary:     Effort: Pulmonary effort is normal. No respiratory distress.     Breath sounds: Normal breath sounds.  Chest:     Chest wall: Tenderness (diffuse TTP central chest/sternum) present.  Musculoskeletal:     Cervical back: Neck supple.  Skin:    General: Skin is dry.  Neurological:     General: No focal deficit present.     Mental Status: She is alert. Mental status is at baseline.     Motor: No weakness.     Gait: Gait normal.  Psychiatric:        Mood and Affect: Mood normal.        Behavior: Behavior normal.        Thought Content: Thought content normal.      UC Treatments / Results  Labs (all labs ordered are listed, but only abnormal results are displayed) Labs Reviewed - No data to display  EKG   Radiology DG Chest 2 View  Result Date: 08/26/2020 CLINICAL DATA:  Chest pain EXAM: CHEST - 2 VIEW COMPARISON:  07/15/2020 FINDINGS: The heart size and mediastinal contours are within normal limits. Both lungs are clear. The visualized skeletal structures are unremarkable.  IMPRESSION: No active cardiopulmonary disease. Electronically Signed   By: Donavan Foil M.D.   On: 08/26/2020 16:40    Procedures ED EKG  Date/Time: 08/26/2020 3:55 PM Performed by: Danton Clap, PA-C Authorized by: Danton Clap, PA-C   ECG reviewed by ED Physician in the absence of a cardiologist: yes   Previous ECG:    Previous ECG:  Unavailable Interpretation:    Interpretation: normal   Rate:    ECG rate:  72   ECG rate assessment: normal   Rhythm:    Rhythm: sinus rhythm   Ectopy:    Ectopy: none   QRS:    QRS axis:  Normal Conduction:    Conduction: normal   ST segments:    ST segments:  Normal T waves:    T waves: normal   Comments:     Normal sinus rhythm, regular rate   (including critical care time)  Medications Ordered in UC Medications  alum & mag hydroxide-simeth (MAALOX/MYLANTA) 200-200-20 MG/5ML suspension 30 mL (30 mLs Oral Given 08/26/20 1621)    And  lidocaine (XYLOCAINE) 2 % viscous mouth solution 15 mL (15 mLs Oral Given 08/26/20 1621)  ketorolac (TORADOL) injection 60 mg (60 mg Intramuscular Given 08/26/20 1707)    Initial Impression / Assessment and Plan / UC Course  I have reviewed the triage vital signs and the nursing notes.  Pertinent labs & imaging results that were available during my care of the patient were reviewed by me and considered in my medical decision making (see chart for details).   48 year old female presenting for a 3-day history of upper central chest pain that is worse with deep inspiration and extension of her back.  On exam she does  have tenderness diffusely of the chest centrally.  EKG performed and within normal limits today.  Chest x-ray also performed and within normal limits today.  I independently reviewed this myself and interpreted the EKG.  Patient given a GI cocktail without significant improvement in the chest pain.  Patient also given 60 mg IM ketorolac and slight improvement in symptoms.  Based on patient's  symptoms and exam advised her that her chest pain is likely musculoskeletal in origin, but there could also be some pain due to GERD.  Treating at this time with Carafate and advised her to increase her at home omeprazole to 40 mg daily.  Patient says she has medications at home that she can try including meloxicam so I advised her to take about once a day.  She says she also has Flexeril so advised her to try that and see if it helps.  She can also try Tylenol for pain relief.  Advised patient that if at any point she has increased chest pain or she is not starting to feel better by tomorrow then she should call EMS or go to ED for further work-up of her chest pain.  Patient understanding and agreeable.  I did advise Covid testing and patient initially agreed.  When the nurse went in to collect the sample, patient declined testing.  We discussed with her that some of her symptoms could be due to Covid and again advised testing, but she declined.  Advised her to definitely return if she develops a fever, cough, or breathing difficulty.  Patient agreeable.  Final Clinical Impressions(s) / UC Diagnoses   Final diagnoses:  Nonspecific chest pain  Gastroesophageal reflux disease without esophagitis     Discharge Instructions     Your EKG and chest x-ray are normal today.  Your chest pain is likely due to a combination of GERD and musculoskeletal chest pain.  You have been given an injection of ketorolac, which is an anti-inflammatory/pain reliever in the clinic today.  We also tried a GI cocktail.  You should increase your omeprazole to 40 mg a day.  I am prescribing Carafate also to help with reflux.  Take your meloxicam daily as well.  You can try your at home cyclobenzaprine to see if that helps.  You can also try Tylenol for pain relief.  Please follow-up with your PCP.  Your blood pressure is elevated so you should keep a log of that and follow-up with PCP if blood pressure continues to be over  140/90.  If your chest pain worsens or you have weakness, dizziness, vomiting, sweats, palpitations, or any new worsening symptoms she should call EMS or have someone take you to the emergency room.     ED Prescriptions    Medication Sig Dispense Auth. Provider   sucralfate (CARAFATE) 1 GM/10ML suspension Take 10 mLs (1 g total) by mouth 4 (four) times daily for 7 days. 420 mL Danton Clap, PA-C     PDMP not reviewed this encounter.   Danton Clap, PA-C 08/27/20 1431

## 2020-08-27 DIAGNOSIS — G35 Multiple sclerosis: Secondary | ICD-10-CM | POA: Diagnosis not present

## 2020-08-28 DIAGNOSIS — G35 Multiple sclerosis: Secondary | ICD-10-CM | POA: Diagnosis not present

## 2020-08-29 DIAGNOSIS — G35 Multiple sclerosis: Secondary | ICD-10-CM | POA: Diagnosis not present

## 2020-08-30 DIAGNOSIS — G35 Multiple sclerosis: Secondary | ICD-10-CM | POA: Diagnosis not present

## 2020-08-31 DIAGNOSIS — G35 Multiple sclerosis: Secondary | ICD-10-CM | POA: Diagnosis not present

## 2020-09-01 DIAGNOSIS — G35 Multiple sclerosis: Secondary | ICD-10-CM | POA: Diagnosis not present

## 2020-09-02 DIAGNOSIS — G35 Multiple sclerosis: Secondary | ICD-10-CM | POA: Diagnosis not present

## 2020-09-03 DIAGNOSIS — G35 Multiple sclerosis: Secondary | ICD-10-CM | POA: Diagnosis not present

## 2020-09-04 DIAGNOSIS — G35 Multiple sclerosis: Secondary | ICD-10-CM | POA: Diagnosis not present

## 2020-09-05 DIAGNOSIS — G35 Multiple sclerosis: Secondary | ICD-10-CM | POA: Diagnosis not present

## 2020-09-06 DIAGNOSIS — G35 Multiple sclerosis: Secondary | ICD-10-CM | POA: Diagnosis not present

## 2020-09-07 DIAGNOSIS — G35 Multiple sclerosis: Secondary | ICD-10-CM | POA: Diagnosis not present

## 2020-09-08 DIAGNOSIS — G35 Multiple sclerosis: Secondary | ICD-10-CM | POA: Diagnosis not present

## 2020-09-09 DIAGNOSIS — G35 Multiple sclerosis: Secondary | ICD-10-CM | POA: Diagnosis not present

## 2020-09-10 DIAGNOSIS — G35 Multiple sclerosis: Secondary | ICD-10-CM | POA: Diagnosis not present

## 2020-09-11 DIAGNOSIS — G35 Multiple sclerosis: Secondary | ICD-10-CM | POA: Diagnosis not present

## 2020-09-12 DIAGNOSIS — G35 Multiple sclerosis: Secondary | ICD-10-CM | POA: Diagnosis not present

## 2020-09-13 DIAGNOSIS — G35 Multiple sclerosis: Secondary | ICD-10-CM | POA: Diagnosis not present

## 2020-09-14 DIAGNOSIS — G35 Multiple sclerosis: Secondary | ICD-10-CM | POA: Diagnosis not present

## 2020-09-15 DIAGNOSIS — G35 Multiple sclerosis: Secondary | ICD-10-CM | POA: Diagnosis not present

## 2020-09-16 DIAGNOSIS — G35 Multiple sclerosis: Secondary | ICD-10-CM | POA: Diagnosis not present

## 2020-09-17 DIAGNOSIS — G35 Multiple sclerosis: Secondary | ICD-10-CM | POA: Diagnosis not present

## 2020-09-18 DIAGNOSIS — G35 Multiple sclerosis: Secondary | ICD-10-CM | POA: Diagnosis not present

## 2020-09-19 DIAGNOSIS — G35 Multiple sclerosis: Secondary | ICD-10-CM | POA: Diagnosis not present

## 2020-09-20 DIAGNOSIS — G35 Multiple sclerosis: Secondary | ICD-10-CM | POA: Diagnosis not present

## 2020-09-21 DIAGNOSIS — G35 Multiple sclerosis: Secondary | ICD-10-CM | POA: Diagnosis not present

## 2020-09-22 DIAGNOSIS — G35 Multiple sclerosis: Secondary | ICD-10-CM | POA: Diagnosis not present

## 2020-09-23 DIAGNOSIS — G35 Multiple sclerosis: Secondary | ICD-10-CM | POA: Diagnosis not present

## 2020-09-24 DIAGNOSIS — G35 Multiple sclerosis: Secondary | ICD-10-CM | POA: Diagnosis not present

## 2020-09-25 DIAGNOSIS — G35 Multiple sclerosis: Secondary | ICD-10-CM | POA: Diagnosis not present

## 2020-09-26 DIAGNOSIS — G35 Multiple sclerosis: Secondary | ICD-10-CM | POA: Diagnosis not present

## 2020-09-27 DIAGNOSIS — G35 Multiple sclerosis: Secondary | ICD-10-CM | POA: Diagnosis not present

## 2020-09-28 DIAGNOSIS — G35 Multiple sclerosis: Secondary | ICD-10-CM | POA: Diagnosis not present

## 2020-09-29 DIAGNOSIS — G35 Multiple sclerosis: Secondary | ICD-10-CM | POA: Diagnosis not present

## 2020-09-30 DIAGNOSIS — G35 Multiple sclerosis: Secondary | ICD-10-CM | POA: Diagnosis not present

## 2020-10-01 DIAGNOSIS — G35 Multiple sclerosis: Secondary | ICD-10-CM | POA: Diagnosis not present

## 2020-10-02 DIAGNOSIS — G35 Multiple sclerosis: Secondary | ICD-10-CM | POA: Diagnosis not present

## 2020-10-03 DIAGNOSIS — G35 Multiple sclerosis: Secondary | ICD-10-CM | POA: Diagnosis not present

## 2020-10-04 DIAGNOSIS — G35 Multiple sclerosis: Secondary | ICD-10-CM | POA: Diagnosis not present

## 2020-10-05 DIAGNOSIS — G35 Multiple sclerosis: Secondary | ICD-10-CM | POA: Diagnosis not present

## 2020-10-06 DIAGNOSIS — G35 Multiple sclerosis: Secondary | ICD-10-CM | POA: Diagnosis not present

## 2020-10-07 DIAGNOSIS — G35 Multiple sclerosis: Secondary | ICD-10-CM | POA: Diagnosis not present

## 2020-10-08 DIAGNOSIS — G35 Multiple sclerosis: Secondary | ICD-10-CM | POA: Diagnosis not present

## 2020-10-09 DIAGNOSIS — G35 Multiple sclerosis: Secondary | ICD-10-CM | POA: Diagnosis not present

## 2020-10-10 DIAGNOSIS — G35 Multiple sclerosis: Secondary | ICD-10-CM | POA: Diagnosis not present

## 2020-10-11 DIAGNOSIS — G35 Multiple sclerosis: Secondary | ICD-10-CM | POA: Diagnosis not present

## 2020-10-12 DIAGNOSIS — G35 Multiple sclerosis: Secondary | ICD-10-CM | POA: Diagnosis not present

## 2020-10-13 ENCOUNTER — Ambulatory Visit: Payer: Self-pay

## 2020-10-13 DIAGNOSIS — G35 Multiple sclerosis: Secondary | ICD-10-CM | POA: Diagnosis not present

## 2020-10-13 NOTE — Progress Notes (Deleted)
Patient is a 49 y.o. female patient of Dr. Ancil Boozer Last visit with there was 04/21/20 She is followed by neurology currently for relapsing remitting MS Follows up today with

## 2020-10-13 NOTE — Telephone Encounter (Signed)
Patient called and says that she's having numbness for the past month or longer on her right side due to her MS. She says she was taken off her MS medicine by her neurologist due to elevated WBC count and that she was supposed to be started on another medication after blood work. She says she is wanting to come in to see the doctor to discuss the blood work and also to talk about weight loss. I asked about other symptoms, she denies. Appointment is scheduled for tomorrow at 0930 with Dr. Roxan Hockey, care advice given, she verbalized understanding.  Reason for Disposition . [1] Numbness or tingling in one or both hands AND [2] is a chronic symptom (recurrent or ongoing AND present > 4 weeks)  Answer Assessment - Initial Assessment Questions 1. SYMPTOM: "What is the main symptom you are concerned about?" (e.g., weakness, numbness)     Numbness 2. ONSET: "When did this start?" (minutes, hours, days; while sleeping)     Over a month 3. LAST NORMAL: "When was the last time you were normal (no symptoms)?"     Over a month ago 4. PATTERN "Does this come and go, or has it been constant since it started?"  "Is it present now?"     More noticeable with movement 5. CARDIAC SYMPTOMS: "Have you had any of the following symptoms: chest pain, difficulty breathing, palpitations?"      No 6. NEUROLOGIC SYMPTOMS: "Have you had any of the following symptoms: headache, dizziness, vision loss, double vision, changes in speech, unsteady on your feet?"     No 7. OTHER SYMPTOMS: "Do you have any other symptoms?"      No 8. PREGNANCY: "Is there any chance you are pregnant?" "When was your last menstrual period?"      No  Protocols used: NEUROLOGIC DEFICIT-A-AH

## 2020-10-14 ENCOUNTER — Ambulatory Visit: Payer: Medicare HMO | Admitting: Internal Medicine

## 2020-10-14 DIAGNOSIS — G35 Multiple sclerosis: Secondary | ICD-10-CM | POA: Diagnosis not present

## 2020-10-15 ENCOUNTER — Ambulatory Visit (INDEPENDENT_AMBULATORY_CARE_PROVIDER_SITE_OTHER): Payer: Medicare HMO | Admitting: Family Medicine

## 2020-10-15 ENCOUNTER — Other Ambulatory Visit: Payer: Self-pay

## 2020-10-15 ENCOUNTER — Encounter: Payer: Self-pay | Admitting: Family Medicine

## 2020-10-15 VITALS — BP 132/86 | HR 95 | Temp 98.1°F | Resp 16 | Ht 62.0 in | Wt 172.9 lb

## 2020-10-15 DIAGNOSIS — D708 Other neutropenia: Secondary | ICD-10-CM | POA: Diagnosis not present

## 2020-10-15 DIAGNOSIS — Z79899 Other long term (current) drug therapy: Secondary | ICD-10-CM | POA: Diagnosis not present

## 2020-10-15 DIAGNOSIS — K219 Gastro-esophageal reflux disease without esophagitis: Secondary | ICD-10-CM | POA: Diagnosis not present

## 2020-10-15 DIAGNOSIS — E559 Vitamin D deficiency, unspecified: Secondary | ICD-10-CM

## 2020-10-15 DIAGNOSIS — F325 Major depressive disorder, single episode, in full remission: Secondary | ICD-10-CM

## 2020-10-15 DIAGNOSIS — Z1211 Encounter for screening for malignant neoplasm of colon: Secondary | ICD-10-CM

## 2020-10-15 DIAGNOSIS — Z227 Latent tuberculosis: Secondary | ICD-10-CM

## 2020-10-15 DIAGNOSIS — Z1231 Encounter for screening mammogram for malignant neoplasm of breast: Secondary | ICD-10-CM

## 2020-10-15 DIAGNOSIS — R69 Illness, unspecified: Secondary | ICD-10-CM | POA: Diagnosis not present

## 2020-10-15 DIAGNOSIS — G43009 Migraine without aura, not intractable, without status migrainosus: Secondary | ICD-10-CM | POA: Diagnosis not present

## 2020-10-15 DIAGNOSIS — G35 Multiple sclerosis: Secondary | ICD-10-CM | POA: Diagnosis not present

## 2020-10-15 DIAGNOSIS — R7989 Other specified abnormal findings of blood chemistry: Secondary | ICD-10-CM

## 2020-10-15 DIAGNOSIS — E8881 Metabolic syndrome: Secondary | ICD-10-CM

## 2020-10-15 DIAGNOSIS — E785 Hyperlipidemia, unspecified: Secondary | ICD-10-CM

## 2020-10-15 DIAGNOSIS — K5909 Other constipation: Secondary | ICD-10-CM

## 2020-10-15 DIAGNOSIS — E669 Obesity, unspecified: Secondary | ICD-10-CM

## 2020-10-15 DIAGNOSIS — M545 Low back pain, unspecified: Secondary | ICD-10-CM

## 2020-10-15 MED ORDER — SUMATRIPTAN SUCCINATE 100 MG PO TABS: 100.0000 mg | ORAL_TABLET | ORAL | 1 refills | Status: DC | PRN

## 2020-10-15 MED ORDER — CYCLOBENZAPRINE HCL 10 MG PO TABS: 5.0000 mg | ORAL_TABLET | Freq: Every evening | ORAL | 1 refills | Status: DC | PRN

## 2020-10-15 NOTE — Progress Notes (Signed)
Name: Tammie Lopez   MRN: [id number]    DOB: 1971-10-12   Date:10/15/2020       Progress Note  Subjective  Chief Complaint  Follow up  HPI   HTN: patient has a history of hypertension but bp normalized and has been off medications for months. 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 Explained she must be treated but we can repeat the test as requested.   Hot flashes: intense and worse at night, she has to get up and take a shower. We gave her clonidine but she decided not to take it, symptoms are stable.   MS: she has been off Tacfidera due to leucopenia, she has noticed increase in numbness on left flank and great big toe ( intermittently) left side, she is under the care of [facility] neurologist     Obesity: history of insulin resistance, took Trulicity in the past and also Metformin, she is upset about her weight gain. Last labs were normal, discussed life style modifications. We will recheck labs and consider going back on Metformin  Chronic constipation/GERD: she is always feeling bloated, Linzess no longer working, had to go Urgent Care a couple of months ago and was given Carafate, we will refer her back to GI   Patient Active Problem List   Diagnosis Date Noted  . 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 Low TSH level 09/19/2019  . Obesity (BMI 30-39.9) 12/06/2017  . Insulin resistance 09/27/2017  . Essential hypertension 09/21/2017  . Right sided sciatica 09/21/2017  . Major depression, recurrent ([place]) 05/24/2017  . Migraine without aura, not intractable 12/09/2016  . Nickel allergy 12/09/2016  . GERD without esophagitis 12/09/2016  . Other neutropenia ([place]) 12/09/2016  . Dyslipidemia 12/09/2016  .  Diverticulosis 12/09/2016  . 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 Allergic rhinitis, seasonal 12/09/2016  . Chronic constipation 12/09/2016  . Vitamin D deficiency 08/06/2013  . MS (multiple sclerosis) ([place]) 04/17/2013    Past Surgical History:  Procedure Laterality Date  . BREAST BIOPSY Left 01/06/2018   neg  . BREAST BIOPSY Left 01/06/2018   neg  . BUNIONECTOMY    . CESAREAN SECTION N/A 2006  . GANGLION CYST EXCISION Bilateral    Hands    Family History  Problem Relation Age of Onset  . Diabetes Mother   . Hypertension Mother   . Cancer Father 75       Mouth  . COPD Father   . Hypertension Brother   . Diabetes Brother     Social History   Tobacco Use  . Smoking status: Never Smoker  . Smokeless tobacco: Never Used  . Tobacco comment: smoking cessation materials not required  Substance Use Topics  . Alcohol use: No     Current Outpatient Medications:  .  cyclobenzaprine (FLEXERIL) 10 MG tablet, Take by mouth. , Disp: , Rfl:  .  diclofenac sodium (VOLTAREN) 1 % GEL, APPLY 4 G TOPICALLY 4 (FOUR) TIMES DAILY., Disp: 300 g, Rfl: 0 .  LINZESS 290 MCG CAPS capsule, TAKE 1 CAPSULE (290 MCG TOTAL) BY MOUTH DAILY., Disp: 30 capsule, Rfl: 2 .  Multiple Vitamin (MULTIVITAMIN) tablet, Take 1 tablet by mouth daily., Disp: , Rfl:  .  Multiple Vitamins-Minerals (ONE DAILY CALCIUM/IRON) TABS, Take by mouth., Disp: , Rfl:  .  omeprazole (PRILOSEC) 40 MG capsule, TAKE 1 CAPSULE BY MOUTH EVERY DAY, Disp: 90 capsule, Rfl: 0 .  SUMAtriptan (IMITREX) 100 MG tablet, TAKE 1 TABLET (100 MG TOTAL) BY MOUTH EVERY 2 (TWO) HOURS AS NEEDED FOR MIGRAINE. MAY REPEAT IN 2 HOURS IF HEADACHE PERSISTS OR RECURS., Disp: 9 tablet, Rfl: 1 .  atenolol-chlorthalidone (TENORETIC) 50-25 MG tablet, TAKE 0.5 TABLETS BY MOUTH IN THE MORNING AND AT BEDTIME. (Patient not taking: Reported on 10/15/2020), Disp: 45 tablet, Rfl: 0 .  celecoxib (CELEBREX) 200 MG capsule, Take by mouth 2 (two) times daily. (Patient not taking:  Reported on 10/15/2020), Disp: , Rfl:  .  Cholecalciferol 100 MCG (4000 UT) CAPS, Take by mouth. (Patient not taking: Reported on 07/21/2020), Disp: , Rfl:  .  cloNIDine (CATAPRES) 0.1 MG tablet, TAKE 1 TABLET (0.1 MG TOTAL) BY MOUTH AT BEDTIME. BLOOD PRESSURE AND HOT FLASHES (Patient not taking: Reported on 07/21/2020), Disp: 90 tablet, Rfl: 1 .  fluticasone (FLONASE) 50 MCG/ACT nasal spray, SPRAY 2 SPRAYS INTO EACH NOSTRIL EVERY DAY (Patient not taking: Reported on 10/15/2020), Disp: 16 mL, Rfl: 2 .  prednisoLONE acetate (PRED FORTE) 1 % ophthalmic suspension, , Disp: , Rfl:  .  SODIUM FLUORIDE 5000 PPM 1.1 % PSTE, Take by mouth. (Patient not taking: Reported on 10/15/2020), Disp: , Rfl:  .  sucralfate (CARAFATE) 1 GM/10ML suspension, Take 10 mLs (1 g total) by mouth 4 (four) times daily for 7 days., Disp: 420 mL, Rfl: 0  Allergies  Allergen Reactions  . Penicillins Hives    I personally reviewed active problem list, medication list, allergies, family history, social history, health maintenance, notes from last encounter with the patient/caregiver today.   ROS  Constitutional: Negative for fever or weight change.  Respiratory: Negative for cough and shortness of breath.   Cardiovascular: Negative for chest pain or palpitations.  Gastrointestinal: Negative for abdominal pain, no bowel changes.  Musculoskeletal: Negative for gait problem or joint swelling.  Skin: Negative for rash. she has some bumps on her back Neurological: Negative for dizziness or headache.  No other specific complaints in a complete review of systems (except as listed in HPI above).  Objective  Vitals:   10/15/20 1127  BP: 132/86  Pulse: 95  Resp: 16  Temp: 98.1 F (36.7 C)  TempSrc: Oral  SpO2: 97%  Weight: 172 lb 14.4 oz (78.4 kg)  Height: 5\' 2"  (1.575 m)    Body mass index is 31.62 kg/m.  Physical Exam  Constitutional: Patient appears well-developed and well-nourished. Obese No distress.  HEENT:  head atraumatic, normocephalic, pupils equal and reactive to light,  neck supple Cardiovascular: Normal rate, regular rhythm and normal heart sounds.  No murmur heard. No BLE edema. Pulmonary/Chest: Effort normal and breath sounds normal. No respiratory distress. Abdominal: Soft.  There is no tenderness. Skin: two small raised nodules on her back, one irritated from picking, no signs of infection, discussed swabbing nose for MRSA but she refused, advised to avoid picking on the lesions and apply otc antibiotic ointment for now Psychiatric: Patient has a normal mood and affect. behavior is normal. Judgment and thought content normal.  PHQ2/9: Depression screen [facility] 2/9 10/15/2020 04/21/2020 11/06/2019 09/18/2019 08/29/2019  Decreased Interest 0 0 0 0 0  Down, Depressed, Hopeless 0 0 0 0 0  PHQ - 2 Score 0 0 0 0 0  Altered sleeping - 0 0 0 0  Tired, decreased energy - 0 0 0 0  Change in appetite - 0 0 0 0  Feeling bad or failure about yourself  - 0 0 0 0  Trouble concentrating - 0 0 0 0  Moving slowly or fidgety/restless - 0 0 0 0  Suicidal thoughts - 0 0 0 0  PHQ-9 Score - 0 0 0 0  Difficult doing work/chores - - - - -  Some recent data might be hidden    phq 9 is negative   Fall Risk: Fall Risk  10/15/2020 10/15/2020 04/21/2020 09/18/2019 08/10/2019  Falls in the past year? 0 0 0 0 0  Number falls in past yr: 0 0 0 0 0  Injury with Fall? 0 0 0 0 0  Follow up - - - - -      Functional Status Survey: Is the patient deaf or have difficulty hearing?: No Does the patient have difficulty seeing, even when wearing glasses/contacts?: No Does the patient have difficulty concentrating, remembering, or making decisions?: No Does the patient have difficulty walking or climbing stairs?: No Does the patient have difficulty dressing or bathing?: No Does the patient have difficulty doing errands alone such as visiting a doctor's office or shopping?: No    Assessment & Plan  1. Vitamin D  deficiency disease  - VITAMIN D 25 Hydroxy (Vit-D Deficiency, Fractures)  2. Other neutropenia ([place])  Recheck level   3. Migraine without aura and without status migrainosus, not intractable  - SUMAtriptan (IMITREX) 100 MG tablet; Take 1 tablet (100 mg total) by mouth every 2 (two) hours as needed for migraine. May repeat in 2 hours if headache persists or recurs.  Dispense: 9 tablet; Refill: 1  4. MS (multiple sclerosis) ([place])  Off medication due to leucopenia  5. Dyslipidemia  - Lipid panel  6. TB lung, latent  - QuantiFERON-TB Gold Plus Explained importance of following health department recommendations but she states she wants to have another test first  7. Low TSH level  - Thyroid Panel With TSH  8. GERD without esophagitis   9. Major depression in remission [facility])  Frustrated about inability to lose weight, discussed life style modifications and also a weight loss center  10. Breast cancer screening by mammogram  - MM Digital Screening; Future  11. Recurrent low back pain  - cyclobenzaprine (FLEXERIL) 10 MG tablet; Take 0.5-1 tablets (5-10 mg total) by mouth at bedtime as needed for muscle spasms.  Dispense: 30 tablet; Refill: 1  12. Obesity (BMI 30.0-34.9)  Discussed with the patient the risk posed by an increased BMI. Discussed importance of portion control, calorie counting and at least 150 minutes of physical activity weekly. Avoid sweet beverages and drink more water. Eat at least 6 servings of fruit and vegetables daily   13. Insulin resistance  - Insulin, Free (Bioactive) - Hemoglobin A1c  14. Long-term use of high-risk medication  - COMPLETE METABOLIC PANEL WITH GFR - CBC with Differential/Platelet  15. Chronic constipation  - Ambulatory referral to Gastroenterology  16. Colon cancer screening  - Ambulatory referral to Gastroenterology

## 2020-10-16 DIAGNOSIS — G35 Multiple sclerosis: Secondary | ICD-10-CM | POA: Diagnosis not present

## 2020-10-17 DIAGNOSIS — G35 Multiple sclerosis: Secondary | ICD-10-CM | POA: Diagnosis not present

## 2020-10-18 DIAGNOSIS — G35 Multiple sclerosis: Secondary | ICD-10-CM | POA: Diagnosis not present

## 2020-10-18 LAB — QUANTIFERON-TB GOLD PLUS
Mitogen-NIL: >10 IU/mL
NIL: 0.08 IU/mL
QuantiFERON-TB Gold Plus: NEGATIVE
TB1-NIL: 0.08 IU/mL
TB2-NIL: 0.05 IU/mL

## 2020-10-18 LAB — COMPLETE METABOLIC PANEL WITH GFR
AG Ratio: 1.9 (calc) (ref 1.0–2.5)
ALT: 13 U/L (ref 6–29)
AST: 12 U/L (ref 10–35)
Albumin: 4.6 g/dL (ref 3.6–5.1)
Alkaline phosphatase (APISO): 40 U/L (ref 31–125)
BUN: 12 mg/dL (ref 7–25)
CO2: 25 mmol/L (ref 20–32)
Calcium: 9.8 mg/dL (ref 8.6–10.2)
Chloride: 107 mmol/L (ref 98–110)
Creat: 0.63 mg/dL (ref 0.50–1.10)
GFR, Est African American: 123 mL/min/{1.73_m2} (ref >=60)
GFR, Est Non African American: 106 mL/min/{1.73_m2} (ref >=60)
Globulin: 2.4 g/dL (calc) (ref 1.9–3.7)
Glucose, Bld: 88 mg/dL (ref 65–99)
Potassium: 4.4 mmol/L (ref 3.5–5.3)
Sodium: 141 mmol/L (ref 135–146)
Total Bilirubin: 0.5 mg/dL (ref 0.2–1.2)
Total Protein: 7 g/dL (ref 6.1–8.1)

## 2020-10-18 LAB — CBC WITH DIFFERENTIAL/PLATELET
Absolute Monocytes: 391 cells/uL (ref 200–950)
Basophils Absolute: 9 cells/uL (ref 0–200)
Basophils Relative: 0.3 %
Eosinophils Absolute: 31 cells/uL (ref 15–500)
Eosinophils Relative: 1 %
HCT: 38.3 % (ref 35.0–45.0)
Hemoglobin: 12.6 g/dL (ref 11.7–15.5)
Lymphs Abs: 1194 cells/uL (ref 850–3900)
MCH: 26.8 pg — ABNORMAL LOW (ref 27.0–33.0)
MCHC: 32.9 g/dL (ref 32.0–36.0)
MCV: 81.3 fL (ref 80.0–100.0)
MPV: 11.2 fL (ref 7.5–12.5)
Monocytes Relative: 12.6 %
Neutro Abs: 1476 cells/uL — ABNORMAL LOW (ref 1500–7800)
Neutrophils Relative %: 47.6 %
Platelets: 276 10*3/uL (ref 140–400)
RBC: 4.71 10*6/uL (ref 3.80–5.10)
RDW: 12.8 % (ref 11.0–15.0)
Total Lymphocyte: 38.5 %
WBC: 3.1 10*3/uL — ABNORMAL LOW (ref 3.8–10.8)

## 2020-10-18 LAB — LIPID PANEL
Cholesterol: 175 mg/dL (ref <200)
HDL: 54 mg/dL (ref >=50)
LDL Cholesterol (Calc): 107 mg/dL (calc) — ABNORMAL HIGH
Non-HDL Cholesterol (Calc): 121 mg/dL (calc) (ref <130)
Total CHOL/HDL Ratio: 3.2 (calc) (ref <5.0)
Triglycerides: 56 mg/dL (ref <150)

## 2020-10-18 LAB — HEMOGLOBIN A1C
Hgb A1c MFr Bld: 5.7 % of total Hgb — ABNORMAL HIGH (ref <5.7)
Mean Plasma Glucose: 117 (calc)
eAG (mmol/L): 6.5 (calc)

## 2020-10-18 LAB — THYROID PANEL WITH TSH
Free Thyroxine Index: 2.1 (ref 1.4–3.8)
T3 Uptake: 30 % (ref 22–35)
T4, Total: 7.1 ug/dL (ref 5.1–11.9)
TSH: 0.46 mIU/L

## 2020-10-18 LAB — INSULIN, FREE (BIOACTIVE): Insulin, Free: 8.6 u[IU]/mL (ref 1.5–14.9)

## 2020-10-18 LAB — VITAMIN D 25 HYDROXY (VIT D DEFICIENCY, FRACTURES): Vit D, 25-Hydroxy: 27 ng/mL — ABNORMAL LOW (ref 30–100)

## 2020-10-19 DIAGNOSIS — G35 Multiple sclerosis: Secondary | ICD-10-CM | POA: Diagnosis not present

## 2020-10-20 DIAGNOSIS — G35 Multiple sclerosis: Secondary | ICD-10-CM | POA: Diagnosis not present

## 2020-10-21 DIAGNOSIS — G35 Multiple sclerosis: Secondary | ICD-10-CM | POA: Diagnosis not present

## 2020-10-22 DIAGNOSIS — G35 Multiple sclerosis: Secondary | ICD-10-CM | POA: Diagnosis not present

## 2020-10-23 DIAGNOSIS — G35 Multiple sclerosis: Secondary | ICD-10-CM | POA: Diagnosis not present

## 2020-10-24 DIAGNOSIS — G35 Multiple sclerosis: Secondary | ICD-10-CM | POA: Diagnosis not present

## 2020-10-25 DIAGNOSIS — G35 Multiple sclerosis: Secondary | ICD-10-CM | POA: Diagnosis not present

## 2020-10-26 DIAGNOSIS — G35 Multiple sclerosis: Secondary | ICD-10-CM | POA: Diagnosis not present

## 2020-10-27 DIAGNOSIS — G35 Multiple sclerosis: Secondary | ICD-10-CM | POA: Diagnosis not present

## 2020-10-28 ENCOUNTER — Telehealth
Admit: 2020-10-28 | Discharge: 2020-10-29 | Payer: MEDICARE | Attending: Physician Assistant | Primary: Physician Assistant

## 2020-10-28 DIAGNOSIS — E559 Vitamin D deficiency, unspecified: Secondary | ICD-10-CM | POA: Diagnosis not present

## 2020-10-28 DIAGNOSIS — G35 Multiple sclerosis: Secondary | ICD-10-CM | POA: Diagnosis not present

## 2020-10-28 MED ORDER — CHOLECALCIFEROL (VITAMIN D3) 1,250 MCG (50,000 UNIT) CAPSULE
ORAL_CAPSULE | ORAL | 0 refills | 168 days | Status: CP
Start: 2020-10-28 — End: ?

## 2020-10-28 MED ORDER — PLEGRIDY 63 MCG/0.5 ML-94 MCG/0.5 ML SUBCUTANEOUS PEN INJECTOR
SUBCUTANEOUS | 0 refills | 0.00000 days | Status: CP
Start: 2020-10-28 — End: ?
  Filled 2020-11-07: qty 1, 28d supply, fill #0

## 2020-10-29 DIAGNOSIS — G35 Multiple sclerosis: Secondary | ICD-10-CM | POA: Diagnosis not present

## 2020-10-30 DIAGNOSIS — G35 Multiple sclerosis: Secondary | ICD-10-CM | POA: Diagnosis not present

## 2020-10-31 DIAGNOSIS — G35 Multiple sclerosis: Secondary | ICD-10-CM | POA: Diagnosis not present

## 2020-11-01 DIAGNOSIS — G35 Multiple sclerosis: Secondary | ICD-10-CM | POA: Diagnosis not present

## 2020-11-02 DIAGNOSIS — G35 Multiple sclerosis: Secondary | ICD-10-CM | POA: Diagnosis not present

## 2020-11-03 DIAGNOSIS — G35 Multiple sclerosis: Secondary | ICD-10-CM | POA: Diagnosis not present

## 2020-11-04 DIAGNOSIS — G35 Multiple sclerosis: Secondary | ICD-10-CM | POA: Diagnosis not present

## 2020-11-05 DIAGNOSIS — G35 Multiple sclerosis: Secondary | ICD-10-CM | POA: Diagnosis not present

## 2020-11-05 MED ORDER — EMPTY CONTAINER
3 refills | 0 days
Start: 2020-11-05 — End: ?

## 2020-11-06 DIAGNOSIS — G35 Multiple sclerosis: Secondary | ICD-10-CM | POA: Diagnosis not present

## 2020-11-07 DIAGNOSIS — G35 Multiple sclerosis: Secondary | ICD-10-CM | POA: Diagnosis not present

## 2020-11-07 MED FILL — PLEGRIDY 63 MCG/0.5 ML-94 MCG/0.5 ML SUBCUTANEOUS PEN INJECTOR: 28 days supply | Qty: 1 | Fill #0 | Status: AC

## 2020-11-07 MED FILL — EMPTY CONTAINER: 120 days supply | Qty: 1 | Fill #0

## 2020-11-07 MED FILL — EMPTY CONTAINER: 120 days supply | Qty: 1 | Fill #0 | Status: AC

## 2020-11-08 DIAGNOSIS — G35 Multiple sclerosis: Secondary | ICD-10-CM | POA: Diagnosis not present

## 2020-11-09 DIAGNOSIS — G35 Multiple sclerosis: Secondary | ICD-10-CM | POA: Diagnosis not present

## 2020-11-10 DIAGNOSIS — G35 Multiple sclerosis: Secondary | ICD-10-CM | POA: Diagnosis not present

## 2020-11-11 DIAGNOSIS — G35 Multiple sclerosis: Secondary | ICD-10-CM | POA: Diagnosis not present

## 2020-11-13 DIAGNOSIS — G35 Multiple sclerosis: Secondary | ICD-10-CM | POA: Diagnosis not present

## 2020-11-14 DIAGNOSIS — G35 Multiple sclerosis: Secondary | ICD-10-CM | POA: Diagnosis not present

## 2020-11-15 DIAGNOSIS — G35 Multiple sclerosis: Secondary | ICD-10-CM | POA: Diagnosis not present

## 2020-11-16 DIAGNOSIS — G35 Multiple sclerosis: Secondary | ICD-10-CM | POA: Diagnosis not present

## 2020-11-17 DIAGNOSIS — G35 Multiple sclerosis: Secondary | ICD-10-CM | POA: Diagnosis not present

## 2020-11-18 DIAGNOSIS — G35 Multiple sclerosis: Secondary | ICD-10-CM | POA: Diagnosis not present

## 2020-11-19 DIAGNOSIS — G35 Multiple sclerosis: Secondary | ICD-10-CM | POA: Diagnosis not present

## 2020-11-20 DIAGNOSIS — G35 Multiple sclerosis: Secondary | ICD-10-CM | POA: Diagnosis not present

## 2020-11-21 DIAGNOSIS — G35 Multiple sclerosis: Secondary | ICD-10-CM | POA: Diagnosis not present

## 2020-11-22 DIAGNOSIS — G35 Multiple sclerosis: Secondary | ICD-10-CM | POA: Diagnosis not present

## 2020-11-23 DIAGNOSIS — G35 Multiple sclerosis: Secondary | ICD-10-CM | POA: Diagnosis not present

## 2020-11-24 DIAGNOSIS — G35 Multiple sclerosis: Secondary | ICD-10-CM | POA: Diagnosis not present

## 2020-11-25 DIAGNOSIS — G35 Multiple sclerosis: Secondary | ICD-10-CM | POA: Diagnosis not present

## 2020-11-25 MED ORDER — PLEGRIDY 125 MCG/0.5 ML SUBCUTANEOUS PEN INJECTOR
SUBCUTANEOUS | 4 refills | 0 days | Status: CP
Start: 2020-11-25 — End: ?
  Filled 2021-01-07: qty 1, 28d supply, fill #0

## 2020-11-26 DIAGNOSIS — G35 Multiple sclerosis: Secondary | ICD-10-CM | POA: Diagnosis not present

## 2020-11-27 DIAGNOSIS — G35 Multiple sclerosis: Secondary | ICD-10-CM | POA: Diagnosis not present

## 2020-11-28 DIAGNOSIS — G35 Multiple sclerosis: Secondary | ICD-10-CM | POA: Diagnosis not present

## 2020-12-04 DIAGNOSIS — G35 Multiple sclerosis: Secondary | ICD-10-CM | POA: Diagnosis not present

## 2020-12-05 DIAGNOSIS — G35 Multiple sclerosis: Secondary | ICD-10-CM | POA: Diagnosis not present

## 2020-12-06 DIAGNOSIS — G35 Multiple sclerosis: Secondary | ICD-10-CM | POA: Diagnosis not present

## 2020-12-07 DIAGNOSIS — G35 Multiple sclerosis: Secondary | ICD-10-CM | POA: Diagnosis not present

## 2020-12-08 DIAGNOSIS — G35 Multiple sclerosis: Secondary | ICD-10-CM | POA: Diagnosis not present

## 2020-12-09 DIAGNOSIS — G35 Multiple sclerosis: Secondary | ICD-10-CM | POA: Diagnosis not present

## 2020-12-10 ENCOUNTER — Ambulatory Visit: Payer: Medicare HMO | Admitting: Gastroenterology

## 2020-12-10 DIAGNOSIS — G35 Multiple sclerosis: Secondary | ICD-10-CM | POA: Diagnosis not present

## 2020-12-11 DIAGNOSIS — G35 Multiple sclerosis: Secondary | ICD-10-CM | POA: Diagnosis not present

## 2020-12-12 DIAGNOSIS — G35 Multiple sclerosis: Secondary | ICD-10-CM | POA: Diagnosis not present

## 2020-12-13 DIAGNOSIS — G35 Multiple sclerosis: Secondary | ICD-10-CM | POA: Diagnosis not present

## 2020-12-14 DIAGNOSIS — G35 Multiple sclerosis: Secondary | ICD-10-CM | POA: Diagnosis not present

## 2020-12-15 DIAGNOSIS — G35 Multiple sclerosis: Secondary | ICD-10-CM | POA: Diagnosis not present

## 2020-12-16 DIAGNOSIS — G35 Multiple sclerosis: Secondary | ICD-10-CM | POA: Diagnosis not present

## 2020-12-17 DIAGNOSIS — G35 Multiple sclerosis: Secondary | ICD-10-CM | POA: Diagnosis not present

## 2020-12-18 ENCOUNTER — Ambulatory Visit (INDEPENDENT_AMBULATORY_CARE_PROVIDER_SITE_OTHER): Payer: Medicare HMO

## 2020-12-18 DIAGNOSIS — Z0000 Encounter for general adult medical examination without abnormal findings: Secondary | ICD-10-CM | POA: Diagnosis not present

## 2020-12-18 DIAGNOSIS — G35 Multiple sclerosis: Secondary | ICD-10-CM | POA: Diagnosis not present

## 2020-12-18 NOTE — Patient Instructions (Signed)
Tammie Lopez , Thank you for taking time to come for your Medicare Wellness Visit. I appreciate your ongoing commitment to your health goals. Please review the following plan we discussed and let me know if I can assist you in the future.   Screening recommendations/referrals: Colonoscopy: due - pt scheduled to see Dr. Servando Snare in February Mammogram: done 09/14/19. Please call [phone] to schedule your mammogram.  Bone Density: due at age 50 Recommended yearly ophthalmology/optometry visit for glaucoma screening and checkup Recommended yearly dental visit for hygiene and checkup  Vaccinations: Influenza vaccine: declined Pneumococcal vaccine: due at age 58 Tdap vaccine: done 01/29/13 Shingles vaccine: due at age 60  Covid-19: done 04/17/20 & 05/08/20  Advanced directives: Advance directive discussed with you today. Even though you declined this today please call our office should you change your mind and we can give you the proper paperwork for you to fill out.  Conditions/risks identified: Recommend increasing physical activity   Next appointment: Follow up in one year for your annual wellness visit.   Preventive Care 40-64 Years, Female Preventive care refers to lifestyle choices and visits with your health care provider that can promote health and wellness. What does preventive care include?  A yearly physical exam. This is also called an annual well check.  Dental exams once or twice a year.  Routine eye exams. Ask your health care provider how often you should have your eyes checked.  Personal lifestyle choices, including:  Daily care of your teeth and gums.  Regular physical activity.  Eating a healthy diet.  Avoiding tobacco and drug use.  Limiting alcohol use.  Practicing safe sex.  Taking low-dose aspirin daily starting at age 72.  Taking vitamin and mineral supplements as recommended by your health care provider. What happens during an annual well check? The  services and screenings done by your health care provider during your annual well check will depend on your age, overall health, lifestyle risk factors, and family history of disease. Counseling  Your health care provider may ask you questions about your:  Alcohol use.  Tobacco use.  Drug use.  Emotional well-being.  Home and relationship well-being.  Sexual activity.  Eating habits.  Work and work Astronomer.  Method of birth control.  Menstrual cycle.  Pregnancy history. Screening  You may have the following tests or measurements:  Height, weight, and BMI.  Blood pressure.  Lipid and cholesterol levels. These may be checked every 5 years, or more frequently if you are over 19 years old.  Skin check.  Lung cancer screening. You may have this screening every year starting at age 85 if you have a 30-pack-year history of smoking and currently smoke or have quit within the past 15 years.  Fecal occult blood test (FOBT) of the stool. You may have this test every year starting at age 81.  Flexible sigmoidoscopy or colonoscopy. You may have a sigmoidoscopy every 5 years or a colonoscopy every 10 years starting at age 72.  Hepatitis C blood test.  Hepatitis B blood test.  Sexually transmitted disease (STD) testing.  Diabetes screening. This is done by checking your blood sugar (glucose) after you have not eaten for a while (fasting). You may have this done every 1-3 years.  Mammogram. This may be done every 1-2 years. Talk to your health care provider about when you should start having regular mammograms. This may depend on whether you have a family history of breast cancer.  BRCA-related cancer screening. This may  be done if you have a family history of breast, ovarian, tubal, or peritoneal cancers.  Pelvic exam and Pap test. This may be done every 3 years starting at age 36. Starting at age 67, this may be done every 5 years if you have a Pap test in combination with  an HPV test.  Bone density scan. This is done to screen for osteoporosis. You may have this scan if you are at high risk for osteoporosis. Discuss your test results, treatment options, and if necessary, the need for more tests with your health care provider. Vaccines  Your health care provider may recommend certain vaccines, such as:  Influenza vaccine. This is recommended every year.  Tetanus, diphtheria, and acellular pertussis (Tdap, Td) vaccine. You may need a Td booster every 10 years.  Zoster vaccine. You may need this after age 17.  Pneumococcal 13-valent conjugate (PCV13) vaccine. You may need this if you have certain conditions and were not previously vaccinated.  Pneumococcal polysaccharide (PPSV23) vaccine. You may need one or two doses if you smoke cigarettes or if you have certain conditions. Talk to your health care provider about which screenings and vaccines you need and how often you need them. This information is not intended to replace advice given to you by your health care provider. Make sure you discuss any questions you have with your health care provider. Document Released: 12/12/2015 Document Revised: 08/04/2016 Document Reviewed: 09/16/2015 Elsevier Interactive Patient Education  2017 [employer] Prevention in the Home Falls can cause injuries. They can happen to people of all ages. There are many things you can do to make your home safe and to help prevent falls. What can I do on the outside of my home?  Regularly fix the edges of walkways and driveways and fix any cracks.  Remove anything that might make you trip as you walk through a door, such as a raised step or threshold.  Trim any bushes or trees on the path to your home.  Use bright outdoor lighting.  Clear any walking paths of anything that might make someone trip, such as rocks or tools.  Regularly check to see if handrails are loose or broken. Make sure that both sides of any steps  have handrails.  Any raised decks and porches should have guardrails on the edges.  Have any leaves, snow, or ice cleared regularly.  Use sand or salt on walking paths during winter.  Clean up any spills in your garage right away. This includes oil or grease spills. What can I do in the bathroom?  Use night lights.  Install grab bars by the toilet and in the tub and shower. Do not use towel bars as grab bars.  Use non-skid mats or decals in the tub or shower.  If you need to sit down in the shower, use a plastic, non-slip stool.  Keep the floor dry. Clean up any water that spills on the floor as soon as it happens.  Remove soap buildup in the tub or shower regularly.  Attach bath mats securely with double-sided non-slip rug tape.  Do not have throw rugs and other things on the floor that can make you trip. What can I do in the bedroom?  Use night lights.  Make sure that you have a light by your bed that is easy to reach.  Do not use any sheets or blankets that are too big for your bed. They should not hang down onto  the floor.  Have a firm chair that has side arms. You can use this for support while you get dressed.  Do not have throw rugs and other things on the floor that can make you trip. What can I do in the kitchen?  Clean up any spills right away.  Avoid walking on wet floors.  Keep items that you use a lot in easy-to-reach places.  If you need to reach something above you, use a strong step stool that has a grab bar.  Keep electrical cords out of the way.  Do not use floor polish or wax that makes floors slippery. If you must use wax, use non-skid floor wax.  Do not have throw rugs and other things on the floor that can make you trip. What can I do with my stairs?  Do not leave any items on the stairs.  Make sure that there are handrails on both sides of the stairs and use them. Fix handrails that are broken or loose. Make sure that handrails are as  long as the stairways.  Check any carpeting to make sure that it is firmly attached to the stairs. Fix any carpet that is loose or worn.  Avoid having throw rugs at the top or bottom of the stairs. If you do have throw rugs, attach them to the floor with carpet tape.  Make sure that you have a light switch at the top of the stairs and the bottom of the stairs. If you do not have them, ask someone to add them for you. What else can I do to help prevent falls?  Wear shoes that:  Do not have high heels.  Have rubber bottoms.  Are comfortable and fit you well.  Are closed at the toe. Do not wear sandals.  If you use a stepladder:  Make sure that it is fully opened. Do not climb a closed stepladder.  Make sure that both sides of the stepladder are locked into place.  Ask someone to hold it for you, if possible.  Clearly mark and make sure that you can see:  Any grab bars or handrails.  First and last steps.  Where the edge of each step is.  Use tools that help you move around (mobility aids) if they are needed. These include:  Canes.  Walkers.  Scooters.  Crutches.  Turn on the lights when you go into a dark area. Replace any light bulbs as soon as they burn out.  Set up your furniture so you have a clear path. Avoid moving your furniture around.  If any of your floors are uneven, fix them.  If there are any pets around you, be aware of where they are.  Review your medicines with your doctor. Some medicines can make you feel dizzy. This can increase your chance of falling. Ask your doctor what other things that you can do to help prevent falls. This information is not intended to replace advice given to you by your health care provider. Make sure you discuss any questions you have with your health care provider. Document Released: 09/11/2009 Document Revised: 04/22/2016 Document Reviewed: 12/20/2014 Elsevier Interactive Patient Education  2017 [employer].

## 2020-12-18 NOTE — Progress Notes (Signed)
Subjective:   Tammie Lopez is a 50 y.o. female who presents for Medicare Annual (Subsequent) preventive examination.  Virtual Visit via Video Note  I connected with  Tammie Lopez on 12/18/20 at 11:20 AM EST via telehealth video enabled device and verified that I am speaking with the correct person using two identifiers.  Location: Patient: home Provider: [place] Persons participating in the virtual visit: [place]   I discussed the limitations, risks, security and privacy concerns of performing an evaluation and management service by telephone and the availability of in person appointments. The patient expressed understanding and agreed to proceed.  Some vital signs may be absent or patient reported.   Tammie Marker, LPN    Review of Systems           Objective:    There were no vitals filed for this visit. There is no height or weight on file to calculate BMI.  Advanced Directives 12/18/2020 12/14/2018 11/24/2018 02/07/2018 10/26/2017 09/21/2017 08/09/2017  Does Patient Have a Medical Advance Directive? No No No No No No No  Would patient like information on creating a medical advance directive? No - Patient declined - No - Patient declined - - - -    Current Medications (verified) Outpatient Encounter Medications as of 12/18/2020  Medication Sig  . Cholecalciferol (VITAMIN D) 50 MCG (2000 UT) CAPS Take by mouth.  . cyclobenzaprine (FLEXERIL) 10 MG tablet Take 0.5-1 tablets (5-10 mg total) by mouth at bedtime as needed for muscle spasms.  Marland Kitchen LINZESS 290 MCG CAPS capsule TAKE 1 CAPSULE (290 MCG TOTAL) BY MOUTH DAILY.  . Multiple Vitamins-Minerals (ONE DAILY CALCIUM/IRON) TABS Take by mouth.  Marland Kitchen omeprazole (PRILOSEC) 40 MG capsule TAKE 1 CAPSULE BY MOUTH EVERY DAY  . Peginterferon Beta-1a (PLEGRIDY) 125 MCG/0.5ML SOPN Inject into the skin. Inject the contents of 1 pen (125 mcg) under the skin every fourteen (14) days.  . SUMAtriptan (IMITREX) 100 MG tablet  Take 1 tablet (100 mg total) by mouth every 2 (two) hours as needed for migraine. May repeat in 2 hours if headache persists or recurs.  . [DISCONTINUED] celecoxib (CELEBREX) 200 MG capsule Take by mouth 2 (two) times daily. (Patient not taking: Reported on 10/15/2020)  . [DISCONTINUED] diclofenac sodium (VOLTAREN) 1 % GEL APPLY 4 G TOPICALLY 4 (FOUR) TIMES DAILY.   No facility-administered encounter medications on file as of 12/18/2020.    Allergies (verified) Penicillins   History: Past Medical History:  Diagnosis Date  . Allergy   . Constipation   . Diverticula of colon   . Insomnia   . Migraine   . MS (multiple sclerosis) ([place])   . Muscle spasm    Past Surgical History:  Procedure Laterality Date  . BREAST BIOPSY Left 01/06/2018   neg  . BREAST BIOPSY Left 01/06/2018   neg  . BUNIONECTOMY    . CESAREAN SECTION N/A 2006  . GANGLION CYST EXCISION Bilateral    Hands   Family History  Problem Relation Age of Onset  . Diabetes Mother   . Hypertension Mother   . Cancer Father 75       Mouth  . COPD Father   . Hypertension Brother   . Diabetes Brother    Social History   Socioeconomic History  . Marital status: Single    Spouse name: Not on file  . Number of children: 3  . Years of education: Not on file  . Highest education level: High school graduate  Occupational History    Comment: bus driver  Tobacco Use  . Smoking status: Never Smoker  . Smokeless tobacco: Never Used  . Tobacco comment: smoking cessation materials not required  Vaping Use  . Vaping Use: Never used  Substance and Sexual Activity  . Alcohol use: No  . Drug use: No  . Sexual activity: Not Currently    Partners: Male  Other Topics Concern  . 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 Difficulty of Paying Living Expenses: Not hard at all  Food Insecurity: No Food Insecurity  . Worried About Programme researcher, broadcasting/film/video in the Last Year: Never true  . Ran Out of Food in the Last Year: Never true  Transportation Needs: No Transportation Needs  . Lack of Transportation (Medical): No  . Lack of Transportation (Non-Medical): No  Physical Activity: Inactive  . Days of Exercise per Week: 0 days  . Minutes of Exercise per Session: 0 min  Stress: No Stress Concern Present  . Feeling of Stress : Not at all  Social Connections: Moderately Isolated  . Frequency of Communication with Friends and Family: More than three times a week  . Frequency of Social Gatherings with Friends and Family: More than three times a week  . Attends Religious Services: More than 4 times per year  . Active Member of Clubs or Organizations: No  . Attends Banker Meetings: Never  . Marital Status: Never married    Tobacco Counseling Counseling given: Not Answered Comment: smoking cessation materials not required   Clinical Intake:  Pre-visit preparation completed: Yes  Pain : No/denies pain     Nutritional Risks: None Diabetes: No  How often do you need to have someone help you when you read instructions, pamphlets, or other written materials from your doctor or pharmacy?: 1 - Never    Interpreter Needed?: No  Information entered by :: Reather Littler LPN   Activities of Daily Living In your present state of health, do you have any difficulty performing the following activities: 12/18/2020 10/15/2020  Hearing? N N  Comment declines hearing aids -  Vision? N N  Difficulty concentrating or making decisions? N N  Walking or climbing stairs? N N  Dressing or bathing? N N  Doing errands, shopping? N N  Preparing Food and eating ? N -  Using the Toilet? N -  In the past six months, have you accidently leaked urine? N -  Do you have problems with loss of bowel control?  N -  Managing your Medications? N -  Managing your Finances? N -  Housekeeping or managing your Housekeeping? N -  Some recent data might be hidden    Patient Care Team: Alba Cory, MD as PCP - General (Family Medicine) Annette Stable, PA-C as Physician Assistant (Physician Assistant)  Indicate any recent Medical Services you may have received from other than [facility] providers in the past year (date may be approximate).     Assessment:   This is a routine wellness examination for Tammie Lopez.  Hearing/Vision screen  Hearing Screening   125Hz  250Hz  500Hz  1000Hz  2000Hz  3000Hz  4000Hz  6000Hz  8000Hz   Right ear:           Left  ear:           Comments: Pt has no difficulty hearing  Vision Screening Comments: Annual vision screenings done by Dr. Gloriann Loan  Dietary issues and exercise activities discussed: Current Exercise Habits: The patient does not participate in regular exercise at present, Exercise limited by: neurologic condition(s)  Goals    . DIET - INCREASE WATER INTAKE     Recommend drinking 6-8 glasses of water per day     . Increase physical activity     Recommend increasing physical activity to 150 minutes per week      Depression Screen PHQ 2/9 Scores 12/18/2020 10/15/2020 04/21/2020 11/06/2019 09/18/2019 08/29/2019 08/10/2019  PHQ - 2 Score 0 0 0 0 0 0 0  PHQ- 9 Score - - 0 0 0 0 0    Fall Risk Fall Risk  12/18/2020 10/15/2020 10/15/2020 04/21/2020 09/18/2019  Falls in the past year? 0 0 0 0 0  Number falls in past yr: 0 0 0 0 0  Injury with Fall? 0 0 0 0 0  Risk for fall due to : No Fall Risks - - - -  Follow up Falls prevention discussed - - - -    FALL RISK PREVENTION PERTAINING TO THE HOME:  Any stairs in or around the home? Yes  If so, are there any without handrails? No  Home free of loose throw rugs in walkways, pet beds, electrical cords, etc? Yes  Adequate lighting in your home to reduce risk of falls? Yes   ASSISTIVE DEVICES UTILIZED TO PREVENT  FALLS:  Life alert? No  Use of a cane, walker or w/c? No  Grab bars in the bathroom? No  Shower chair or bench in shower? No  Elevated toilet seat or a handicapped toilet? No   TIMED UP AND GO:  Was the test performed? No . Virtual visit   Cognitive Function: Normal cognitive status assessed by direct observation by this Nurse Health Advisor. No abnormalities found.       6CIT Screen 11/24/2018  What Year? 0 points  What month? 0 points  What time? 0 points  Count back from 20 0 points  Months in reverse 0 points  Repeat phrase 0 points  Total Score 0    Immunizations Immunization History  Administered Date(s) Administered  . Influenza-Unspecified 11/19/2013, 11/04/2014  . PFIZER(Purple Top)SARS-COV-2 Vaccination 04/17/2020, 05/08/2020  . Tdap 01/29/2013    TDAP status: Up to date  Flu Vaccine status: Declined, Education has been provided regarding the importance of this vaccine but patient still declined. Advised may receive this vaccine at local pharmacy or Health Dept. Aware to provide a copy of the vaccination record if obtained from local pharmacy or Health Dept. Verbalized acceptance and understanding.   Pneumococcal vaccine status: due at age 43  Covid-19 vaccine status: Completed vaccines  Qualifies for Shingles Vaccine? No   Due at age 38  Screening Tests Health Maintenance  Topic Date Due  . COLONOSCOPY (Pts 45-30yrs Insurance coverage will need to be confirmed)  Never done  . PAP SMEAR-Modifier  05/24/2020  . COVID-19 Vaccine (3 - Pfizer risk 4-dose series) 06/05/2020  . MAMMOGRAM  09/13/2020  . TETANUS/TDAP  01/30/2023  . Hepatitis C Screening  Completed  . HIV Screening  Completed  . INFLUENZA VACCINE  Discontinued    Health Maintenance  Health Maintenance Due  Topic Date Due  . COLONOSCOPY (Pts 45-66yrs Insurance coverage will need to be confirmed)  Never done  . PAP SMEAR-Modifier  05/24/2020  . COVID-19 Vaccine (3 - Pfizer risk 4-dose  series) 06/05/2020  . MAMMOGRAM  09/13/2020   Colorectal cancer screening: pt scheduled to see Dr. Servando Snare in February to discuss.   Mammogram status: Completed 09/14/19. Repeat every year . Ordered 10/15/20  Bone density status: due at age 50  Lung Cancer Screening: (Low Dose CT Chest recommended if Age 69-80 years, 30 pack-year currently smoking OR have quit w/in 15years.) does not qualify.    Additional Screening:  Hepatitis C Screening: does qualify; Completed 11/24/18  Vision Screening: Recommended annual ophthalmology exams for early detection of glaucoma and other disorders of the eye. Is the patient up to date with their annual eye exam?  Yes  Who is the provider or what is the name of the office in which the patient attends annual eye exams? Dr. Alvester Morin   Dental Screening: Recommended annual dental exams for proper oral hygiene  Community Resource Referral / Chronic Care Management: CRR required this visit?  No   CCM required this visit?  No      Plan:     I have personally reviewed and noted the following in the patient's chart:   . Medical and social history . Use of alcohol, tobacco or illicit drugs  . Current medications and supplements . Functional ability and status . Nutritional status . Physical activity . Advanced directives . List of other physicians . Hospitalizations, surgeries, and ER visits in previous 12 months . Vitals . Screenings to include cognitive, depression, and falls . Referrals and appointments  In addition, I have reviewed and discussed with patient certain preventive protocols, quality metrics, and best practice recommendations. A written personalized care plan for preventive services as well as general preventive health recommendations were provided to patient.     Reather Littler, LPN   03/07/8118   Nurse Notes: none

## 2020-12-19 DIAGNOSIS — G35 Multiple sclerosis: Secondary | ICD-10-CM | POA: Diagnosis not present

## 2020-12-20 DIAGNOSIS — G35 Multiple sclerosis: Secondary | ICD-10-CM | POA: Diagnosis not present

## 2020-12-21 DIAGNOSIS — G35 Multiple sclerosis: Secondary | ICD-10-CM | POA: Diagnosis not present

## 2020-12-22 DIAGNOSIS — G35 Multiple sclerosis: Secondary | ICD-10-CM | POA: Diagnosis not present

## 2020-12-23 DIAGNOSIS — G35 Multiple sclerosis: Secondary | ICD-10-CM | POA: Diagnosis not present

## 2020-12-24 DIAGNOSIS — G35 Multiple sclerosis: Secondary | ICD-10-CM | POA: Diagnosis not present

## 2020-12-25 DIAGNOSIS — G35 Multiple sclerosis: Secondary | ICD-10-CM | POA: Diagnosis not present

## 2020-12-26 DIAGNOSIS — G35 Multiple sclerosis: Secondary | ICD-10-CM | POA: Diagnosis not present

## 2020-12-27 DIAGNOSIS — G35 Multiple sclerosis: Secondary | ICD-10-CM | POA: Diagnosis not present

## 2020-12-28 DIAGNOSIS — G35 Multiple sclerosis: Secondary | ICD-10-CM | POA: Diagnosis not present

## 2020-12-29 DIAGNOSIS — G35 Multiple sclerosis: Secondary | ICD-10-CM | POA: Diagnosis not present

## 2020-12-30 DIAGNOSIS — G35 Multiple sclerosis: Secondary | ICD-10-CM | POA: Diagnosis not present

## 2020-12-31 DIAGNOSIS — G35 Multiple sclerosis: Secondary | ICD-10-CM | POA: Diagnosis not present

## 2021-01-01 DIAGNOSIS — G35 Multiple sclerosis: Secondary | ICD-10-CM | POA: Diagnosis not present

## 2021-01-02 DIAGNOSIS — G35 Multiple sclerosis: Secondary | ICD-10-CM | POA: Diagnosis not present

## 2021-01-03 DIAGNOSIS — G35 Multiple sclerosis: Secondary | ICD-10-CM | POA: Diagnosis not present

## 2021-01-04 DIAGNOSIS — G35 Multiple sclerosis: Secondary | ICD-10-CM | POA: Diagnosis not present

## 2021-01-05 DIAGNOSIS — G35 Multiple sclerosis: Secondary | ICD-10-CM | POA: Diagnosis not present

## 2021-01-06 DIAGNOSIS — G35 Multiple sclerosis: Secondary | ICD-10-CM | POA: Diagnosis not present

## 2021-01-07 DIAGNOSIS — G35 Multiple sclerosis: Secondary | ICD-10-CM | POA: Diagnosis not present

## 2021-01-08 DIAGNOSIS — G35 Multiple sclerosis: Secondary | ICD-10-CM | POA: Diagnosis not present

## 2021-01-09 DIAGNOSIS — G35 Multiple sclerosis: Secondary | ICD-10-CM | POA: Diagnosis not present

## 2021-01-10 DIAGNOSIS — G35 Multiple sclerosis: Secondary | ICD-10-CM | POA: Diagnosis not present

## 2021-01-11 DIAGNOSIS — G35 Multiple sclerosis: Secondary | ICD-10-CM | POA: Diagnosis not present

## 2021-01-12 DIAGNOSIS — G35 Multiple sclerosis: Secondary | ICD-10-CM | POA: Diagnosis not present

## 2021-01-13 DIAGNOSIS — G35 Multiple sclerosis: Secondary | ICD-10-CM | POA: Diagnosis not present

## 2021-01-14 ENCOUNTER — Encounter: Payer: Self-pay | Admitting: Gastroenterology

## 2021-01-14 ENCOUNTER — Other Ambulatory Visit: Payer: Self-pay

## 2021-01-14 ENCOUNTER — Ambulatory Visit (INDEPENDENT_AMBULATORY_CARE_PROVIDER_SITE_OTHER): Payer: Medicare HMO | Admitting: Gastroenterology

## 2021-01-14 VITALS — BP 147/100 | HR 76 | Temp 97.7°F | Ht 62.0 in | Wt 164.4 lb

## 2021-01-14 DIAGNOSIS — Z860100 Personal history of colon polyps, unspecified: Secondary | ICD-10-CM

## 2021-01-14 DIAGNOSIS — K219 Gastro-esophageal reflux disease without esophagitis: Secondary | ICD-10-CM | POA: Diagnosis not present

## 2021-01-14 DIAGNOSIS — G35 Multiple sclerosis: Secondary | ICD-10-CM | POA: Diagnosis not present

## 2021-01-14 NOTE — Progress Notes (Signed)
Gastroenterology Consultation  Referring Provider:     Steele Sizer, MD Primary Care Physician:  Steele Sizer, MD Primary Gastroenterologist:  Dr. Allen Norris     Reason for Consultation:     Constipation and GERD        HPI:   Tammie Lopez is a 50 y.o. y/o female referred for consultation & management of constipation and GERD by Dr. Ancil Boozer, Drue Stager, MD.  This patient comes in after being in the ER back in the end of 2021 with atypical chest pain and was thought to have musculoskeletal versus GERD symptoms.  The patient has had chronic constipation and it was reported that she is no longer responding to [place].  It was recommended by her primary care provider that she follow-up with GI for these issues. The patient reports that her heartburn has been under good control.  She also reports that when she started taking the Linzess the correct way she is no longer having any issues with constipation.  The patient did have a colonoscopy back in 2014 and at that time the patient had a adenomatous polyp removed.  Past Medical History:  Diagnosis Date  . Allergy   . Constipation   . Diverticula of colon   . Insomnia   . Migraine   . MS (multiple sclerosis) ([place])   . Muscle spasm     Past Surgical History:  Procedure Laterality Date  . BREAST BIOPSY Left 01/06/2018   neg  . BREAST BIOPSY Left 01/06/2018   neg  . BUNIONECTOMY    . CESAREAN SECTION N/A 2006  . GANGLION CYST EXCISION Bilateral    Hands    Prior to Admission medications   Medication Sig Start Date End Date Taking? Authorizing Provider  Cholecalciferol (VITAMIN D) 50 MCG (2000 UT) CAPS Take by mouth.    [provider]  cyclobenzaprine (FLEXERIL) 10 MG tablet Take 0.5-1 tablets (5-10 mg total) by mouth at bedtime as needed for muscle spasms. 10/15/20   Steele Sizer, MD  LINZESS 290 MCG CAPS capsule TAKE 1 CAPSULE (290 MCG TOTAL) BY MOUTH DAILY. 04/07/20   Steele Sizer, MD  Multiple Vitamins-Minerals  (ONE DAILY CALCIUM/IRON) TABS Take by mouth.    [provider]  omeprazole (PRILOSEC) 40 MG capsule TAKE 1 CAPSULE BY MOUTH EVERY DAY 03/02/20   Ancil Boozer, Drue Stager, MD  Peginterferon Beta-1a (PLEGRIDY) 125 MCG/0.5ML SOPN Inject into the skin. Inject the contents of 1 pen (125 mcg) under the skin every fourteen (14) days. 11/25/20   [provider]  SUMAtriptan (IMITREX) 100 MG tablet Take 1 tablet (100 mg total) by mouth every 2 (two) hours as needed for migraine. May repeat in 2 hours if headache persists or recurs. 10/15/20   Steele Sizer, MD    Family History  Problem Relation Age of Onset  . Diabetes Mother   . Hypertension Mother   . Cancer Father 75       Mouth  . COPD Father   . Hypertension Brother   . Diabetes Brother      Social History   Tobacco Use  . Smoking status: Never Smoker  . Smokeless tobacco: Never Used  . Tobacco comment: smoking cessation materials not required  Vaping Use  . Vaping Use: Never used  Substance Use Topics  . Alcohol use: No  . Drug use: No    Allergies as of 01/14/2021 - Review Complete 12/18/2020  Allergen Reaction Noted  . Penicillins Hives 08/26/2015    Review of  Systems:    All systems reviewed and negative except where noted in HPI.   Physical Exam:  There were no vitals taken for this visit. No LMP recorded. General:   Alert,  Well-developed, well-nourished, pleasant and cooperative in NAD Head:  Normocephalic and atraumatic. Eyes:  Sclera clear, no icterus.   Conjunctiva pink. Ears:  Normal auditory acuity. Neurologic:  Alert and oriented x3;  grossly normal neurologically. Skin:  Intact without significant lesions or rashes.  No jaundice. Lymph Nodes:  No significant cervical adenopathy. Psych:  Alert and cooperative. Normal mood and affect.  Imaging Studies: No results found.  Assessment and Plan:   Tammie Lopez is a 50 y.o. y/o female who comes in today with a history of constipation and GERD.   The patient states that the constipation and GERD are under control without any issues at the present time.  The patient has not had a repeat colonoscopy for her adenomatous polyp that was found in 2014.  The patient will be set up for a repeat colonoscopy to look for any further development of polyps.  The patient will also continue her Linzess which she states is resulting in a bowel movement every day.  The patient has been explained the plan and agrees with it.    Tammie Lame, MD. Tammie Lopez    Note: This dictation was prepared with Dragon dictation along with smaller phrase technology. Any transcriptional errors that result from this process are unintentional.

## 2021-01-14 NOTE — H&P (View-Only) (Signed)
Gastroenterology Consultation  Referring Provider:     Steele Sizer, MD Primary Care Physician:  Steele Sizer, MD Primary Gastroenterologist:  Dr. Allen Norris     Reason for Consultation:     Constipation and GERD        HPI:   Tammie Lopez is a 50 y.o. y/o female referred for consultation & management of constipation and GERD by Dr. Ancil Boozer, Drue Stager, MD.  This patient comes in after being in the ER back in the end of 2021 with atypical chest pain and was thought to have musculoskeletal versus GERD symptoms.  The patient has had chronic constipation and it was reported that she is no longer responding to [place].  It was recommended by her primary care provider that she follow-up with GI for these issues. The patient reports that her heartburn has been under good control.  She also reports that when she started taking the Linzess the correct way she is no longer having any issues with constipation.  The patient did have a colonoscopy back in 2014 and at that time the patient had a adenomatous polyp removed.  Past Medical History:  Diagnosis Date  . Allergy   . Constipation   . Diverticula of colon   . Insomnia   . Migraine   . MS (multiple sclerosis) ([place])   . Muscle spasm     Past Surgical History:  Procedure Laterality Date  . BREAST BIOPSY Left 01/06/2018   neg  . BREAST BIOPSY Left 01/06/2018   neg  . BUNIONECTOMY    . CESAREAN SECTION N/A 2006  . GANGLION CYST EXCISION Bilateral    Hands    Prior to Admission medications   Medication Sig Start Date End Date Taking? Authorizing Provider  Cholecalciferol (VITAMIN D) 50 MCG (2000 UT) CAPS Take by mouth.    [provider]  cyclobenzaprine (FLEXERIL) 10 MG tablet Take 0.5-1 tablets (5-10 mg total) by mouth at bedtime as needed for muscle spasms. 10/15/20   Steele Sizer, MD  LINZESS 290 MCG CAPS capsule TAKE 1 CAPSULE (290 MCG TOTAL) BY MOUTH DAILY. 04/07/20   Steele Sizer, MD  Multiple Vitamins-Minerals  (ONE DAILY CALCIUM/IRON) TABS Take by mouth.    [provider]  omeprazole (PRILOSEC) 40 MG capsule TAKE 1 CAPSULE BY MOUTH EVERY DAY 03/02/20   Ancil Boozer, Drue Stager, MD  Peginterferon Beta-1a (PLEGRIDY) 125 MCG/0.5ML SOPN Inject into the skin. Inject the contents of 1 pen (125 mcg) under the skin every fourteen (14) days. 11/25/20   [provider]  SUMAtriptan (IMITREX) 100 MG tablet Take 1 tablet (100 mg total) by mouth every 2 (two) hours as needed for migraine. May repeat in 2 hours if headache persists or recurs. 10/15/20   Steele Sizer, MD    Family History  Problem Relation Age of Onset  . Diabetes Mother   . Hypertension Mother   . Cancer Father 75       Mouth  . COPD Father   . Hypertension Brother   . Diabetes Brother      Social History   Tobacco Use  . Smoking status: Never Smoker  . Smokeless tobacco: Never Used  . Tobacco comment: smoking cessation materials not required  Vaping Use  . Vaping Use: Never used  Substance Use Topics  . Alcohol use: No  . Drug use: No    Allergies as of 01/14/2021 - Review Complete 12/18/2020  Allergen Reaction Noted  . Penicillins Hives 08/26/2015    Review of  Systems:    All systems reviewed and negative except where noted in HPI.   Physical Exam:  There were no vitals taken for this visit. No LMP recorded. General:   Alert,  Well-developed, well-nourished, pleasant and cooperative in NAD Head:  Normocephalic and atraumatic. Eyes:  Sclera clear, no icterus.   Conjunctiva pink. Ears:  Normal auditory acuity. Neurologic:  Alert and oriented x3;  grossly normal neurologically. Skin:  Intact without significant lesions or rashes.  No jaundice. Lymph Nodes:  No significant cervical adenopathy. Psych:  Alert and cooperative. Normal mood and affect.  Imaging Studies: No results found.  Assessment and Plan:   Tammie Lopez is a 50 y.o. y/o female who comes in today with a history of constipation and GERD.   The patient states that the constipation and GERD are under control without any issues at the present time.  The patient has not had a repeat colonoscopy for her adenomatous polyp that was found in 2014.  The patient will be set up for a repeat colonoscopy to look for any further development of polyps.  The patient will also continue her Linzess which she states is resulting in a bowel movement every day.  The patient has been explained the plan and agrees with it.    Tammie Lame, MD. Tammie Lopez    Note: This dictation was prepared with Dragon dictation along with smaller phrase technology. Any transcriptional errors that result from this process are unintentional.

## 2021-01-15 DIAGNOSIS — G35 Multiple sclerosis: Secondary | ICD-10-CM | POA: Diagnosis not present

## 2021-01-16 ENCOUNTER — Telehealth
Admit: 2021-01-16 | Discharge: 2021-01-16 | Payer: MEDICARE | Attending: Physician Assistant | Primary: Physician Assistant

## 2021-01-16 DIAGNOSIS — G35 Multiple sclerosis: Secondary | ICD-10-CM | POA: Diagnosis not present

## 2021-01-17 DIAGNOSIS — G35 Multiple sclerosis: Secondary | ICD-10-CM | POA: Diagnosis not present

## 2021-01-18 DIAGNOSIS — G35 Multiple sclerosis: Secondary | ICD-10-CM | POA: Diagnosis not present

## 2021-01-19 DIAGNOSIS — G35 Multiple sclerosis: Secondary | ICD-10-CM | POA: Diagnosis not present

## 2021-01-19 NOTE — Progress Notes (Signed)
Name: Tammie Lopez   MRN: [id number]    DOB: December 11, 1970   Date:01/20/2021       Progress Note  Subjective  Chief Complaint  Follow Up  HPI  HTN: patient has a history of hypertension but bp normalized and has been off medications for months. 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 She has not been checking her bp at home  Hot flashes: intense and worse at night, she has to get up and take a shower. We gave her clonidine but she decided not to take it, symptoms resolved, however LMP was 12/18/2020 , negative pregnancy test today. She would like to resume birth control since sexually active , explained we need to get bp lower, discussed IUD sine progesterone only   MS: she has been off Tacfidera due to leucopenia, she goes to [facility] neurology, she is taking a new medication Plegridy, she states it makes her feel weird.   Obesity: history of insulin resistance, took Trulicity in the past and also Metformin, she has lost 10 lbs since last visit, she has not been drinking sodas since Nov and has been using exercise machine at home. Continue life style modifications  Eczema: worse lately, very pruriginous rash on her abdominal area, using a cream ( not sure of the name) and Cerave cream.   Chronic constipation/GERD: she is always feeling bloated, still takes Linzess prn, seeing Dr. Durwin Reges and scheduled for colonoscopy   AR: she has recurrent episodes of nasal congestion and facial pressure for the past week, no fever or chills, she has occasional post-nasal drainage. She is taking Sudafed prn, explained cannot take it due to elevated bp   Patient Active Problem List   Diagnosis Date Noted  . 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 Low TSH level 09/19/2019  . Obesity (BMI 30-39.9) 12/06/2017  . Insulin resistance 09/27/2017  . Essential  hypertension 09/21/2017  . Right sided sciatica 09/21/2017  . Major depression, recurrent ([place]) 05/24/2017  . Migraine without aura, not intractable 12/09/2016  . Nickel allergy 12/09/2016  . GERD without esophagitis 12/09/2016  . Other neutropenia ([place]) 12/09/2016  . Dyslipidemia 12/09/2016  . Diverticulosis 12/09/2016  . 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 Allergic rhinitis, seasonal 12/09/2016  . Chronic constipation 12/09/2016  . Vitamin D deficiency 08/06/2013  . MS (multiple sclerosis) ([place]) 04/17/2013    Past Surgical History:  Procedure Laterality Date  . BREAST BIOPSY Left 01/06/2018   neg  . BREAST BIOPSY Left 01/06/2018   neg  . BUNIONECTOMY    . CESAREAN SECTION N/A 2006  . GANGLION CYST EXCISION Bilateral    Hands    Family History  Problem Relation Age of Onset  . Diabetes Mother   . Hypertension Mother   . Cancer Father 75       Mouth  . COPD Father   . Hypertension Brother   . Diabetes Brother     Social History   Tobacco Use  . Smoking status: Never Smoker  . Smokeless tobacco: Never Used  . Tobacco comment: smoking cessation materials not required  Substance Use Topics  . Alcohol use: No     Current Outpatient Medications:  .  Cholecalciferol (VITAMIN D) 50 MCG (2000 UT) CAPS, Take by mouth., Disp: , Rfl:  .  cyclobenzaprine (FLEXERIL) 10 MG tablet, Take 0.5-1 tablets (5-10  mg total) by mouth at bedtime as needed for muscle spasms., Disp: 30 tablet, Rfl: 1 .  LINZESS 290 MCG CAPS capsule, TAKE 1 CAPSULE (290 MCG TOTAL) BY MOUTH DAILY., Disp: 30 capsule, Rfl: 2 .  omeprazole (PRILOSEC) 40 MG capsule, TAKE 1 CAPSULE BY MOUTH EVERY DAY, Disp: 90 capsule, Rfl: 0 .  Peginterferon Beta-1a (PLEGRIDY) 125 MCG/0.5ML SOPN, Inject into the skin. Inject the contents of 1 pen (125 mcg) under the skin every fourteen (14) days., Disp: , Rfl:  .  SUMAtriptan (IMITREX) 100 MG tablet, Take 1 tablet (100 mg total) by mouth every 2 (two) hours as needed for migraine. May  repeat in 2 hours if headache persists or recurs., Disp: 9 tablet, Rfl: 1 .  Multiple Vitamins-Minerals (ONE DAILY CALCIUM/IRON) TABS, Take by mouth. (Patient not taking: Reported on 01/20/2021), Disp: , Rfl:   Allergies  Allergen Reactions  . Penicillins Hives    I personally reviewed active problem list, medication list, allergies, family history, social history, health maintenance, notes from last encounter with the patient/caregiver today.   ROS  Constitutional: Negative for fever , positive for weight change.  Respiratory: Negative for cough and shortness of breath.   Cardiovascular: Negative for chest pain or palpitations.  Gastrointestinal: Negative for abdominal pain, no bowel changes.  Musculoskeletal: Negative for gait problem or joint swelling.  Skin: Negative for rash.  Neurological: Negative for dizziness, positive for  headache.  No other specific complaints in a complete review of systems (except as listed in HPI above).  Objective  Vitals:   01/20/21 0942 01/20/21 1012  BP: (!) 160/92 (!) 152/90  Pulse: 98   Resp: 16   Temp: 98 F (36.7 C)   TempSrc: Oral   SpO2: 99%   Weight: 163 lb 8 oz (74.2 kg)   Height: 5\' 2"  (1.575 m)     Body mass index is 29.9 kg/m.  Physical Exam  Constitutional: Patient appears well-developed and well-nourished. Overweight.  No distress.  HEENT: head atraumatic, normocephalic, pupils equal and reactive to light,, neck supple Cardiovascular: Normal rate, regular rhythm and normal heart sounds.  No murmur heard. No BLE edema. Pulmonary/Chest: Effort normal and breath sounds normal. No respiratory distress. Abdominal: Soft.  There is no tenderness. Psychiatric: Patient has a normal mood and affect. behavior is normal. Judgment and thought content normal.  Recent Results (from the past 2160 hour(s))  POCT urine pregnancy     Status: Normal   Collection Time: 01/20/21  9:56 AM  Result Value Ref Range   Preg Test, Ur Negative  Negative      PHQ2/9: Depression screen [facility] 2/9 01/20/2021 12/18/2020 10/15/2020 04/21/2020 11/06/2019  Decreased Interest 0 0 0 0 0  Down, Depressed, Hopeless 0 0 0 0 0  PHQ - 2 Score 0 0 0 0 0  Altered sleeping - - - 0 0  Tired, decreased energy - - - 0 0  Change in appetite - - - 0 0  Feeling bad or failure about yourself  - - - 0 0  Trouble concentrating - - - 0 0  Moving slowly or fidgety/restless - - - 0 0  Suicidal thoughts - - - 0 0  PHQ-9 Score - - - 0 0  Difficult doing work/chores - - - - -  Some recent data might be hidden    phq 9 is negative   Fall Risk: Fall Risk  01/20/2021 12/18/2020 10/15/2020 10/15/2020 04/21/2020  Falls in the past year? 0 0 0  0 0  Number falls in past yr: 0 0 0 0 0  Injury with Fall? 0 0 0 0 0  Risk for fall due to : - No Fall Risks - - -  Follow up - Falls prevention discussed - - -     Functional Status Survey: Is the patient deaf or have difficulty hearing?: No Does the patient have difficulty seeing, even when wearing glasses/contacts?: Yes Does the patient have difficulty concentrating, remembering, or making decisions?: No Does the patient have difficulty walking or climbing stairs?: Yes Does the patient have difficulty dressing or bathing?: No Does the patient have difficulty doing errands alone such as visiting a doctor's office or shopping?: No    Assessment & Plan  1. Missed menses  - POCT urine pregnancy  2. Migraine without aura and without status migrainosus, not intractable   3. Vitamin D deficiency disease   4. Other neutropenia ([place])  No longer on Teafidera   5. Dyslipidemia  Recheck labs   6. GERD without esophagitis  Stable  7. MS (multiple sclerosis) ([place])  Keep follow up with neurologist   8. Major depression in remission [facility])  Doing well , not on medication  9. Eczema, unspecified type  - triamcinolone (KENALOG) 0.1 %; Apply 1 application topically 2 (two) times daily.  Dispense: 453.6  g; Refill: 0  10. Uncontrolled hypertension  - amLODipine (NORVASC) 2.5 MG tablet; Take 1 tablet (2.5 mg total) by mouth daily.  Dispense: 90 tablet; Refill: 0  11. Seasonal allergic rhinitis, unspecified trigger  - azelastine (ASTELIN) 0.1 % nasal spray; Place 1 spray into both nostrils 2 (two) times daily. Use in each nostril as directed  Dispense: 30 mL; Refill: 2 - loratadine (CLARITIN) 10 MG tablet; Take 1 tablet (10 mg total) by mouth daily.  Dispense: 30 tablet; Refill: 2

## 2021-01-20 ENCOUNTER — Encounter: Payer: Self-pay | Admitting: Family Medicine

## 2021-01-20 ENCOUNTER — Other Ambulatory Visit: Payer: Self-pay

## 2021-01-20 ENCOUNTER — Ambulatory Visit (INDEPENDENT_AMBULATORY_CARE_PROVIDER_SITE_OTHER): Payer: Medicare HMO | Admitting: Family Medicine

## 2021-01-20 VITALS — BP 152/90 | HR 98 | Temp 98.0°F | Resp 16 | Ht 62.0 in | Wt 163.5 lb

## 2021-01-20 DIAGNOSIS — D708 Other neutropenia: Secondary | ICD-10-CM | POA: Diagnosis not present

## 2021-01-20 DIAGNOSIS — G35 Multiple sclerosis: Secondary | ICD-10-CM | POA: Diagnosis not present

## 2021-01-20 DIAGNOSIS — N926 Irregular menstruation, unspecified: Secondary | ICD-10-CM | POA: Diagnosis not present

## 2021-01-20 DIAGNOSIS — E785 Hyperlipidemia, unspecified: Secondary | ICD-10-CM

## 2021-01-20 DIAGNOSIS — K219 Gastro-esophageal reflux disease without esophagitis: Secondary | ICD-10-CM | POA: Diagnosis not present

## 2021-01-20 DIAGNOSIS — J302 Other seasonal allergic rhinitis: Secondary | ICD-10-CM

## 2021-01-20 DIAGNOSIS — I10 Essential (primary) hypertension: Secondary | ICD-10-CM

## 2021-01-20 DIAGNOSIS — L309 Dermatitis, unspecified: Secondary | ICD-10-CM

## 2021-01-20 DIAGNOSIS — G43009 Migraine without aura, not intractable, without status migrainosus: Secondary | ICD-10-CM | POA: Diagnosis not present

## 2021-01-20 DIAGNOSIS — F325 Major depressive disorder, single episode, in full remission: Secondary | ICD-10-CM

## 2021-01-20 DIAGNOSIS — E559 Vitamin D deficiency, unspecified: Secondary | ICD-10-CM | POA: Diagnosis not present

## 2021-01-20 DIAGNOSIS — R69 Illness, unspecified: Secondary | ICD-10-CM | POA: Diagnosis not present

## 2021-01-20 LAB — POCT URINE PREGNANCY: Preg Test, Ur: NEGATIVE

## 2021-01-20 MED ORDER — TRIAMCINOLONE ACETONIDE 0.1 % TOPICAL CREAM
0 days
Start: 2021-01-20 — End: ?

## 2021-01-20 MED ORDER — AMLODIPINE 2.5 MG TABLET
0 days
Start: 2021-01-20 — End: ?

## 2021-01-20 MED ORDER — AMLODIPINE BESYLATE 2.5 MG PO TABS: 2.5000 mg | ORAL_TABLET | Freq: Every day | ORAL | 0 refills | Status: DC

## 2021-01-20 MED ORDER — LORATADINE 10 MG PO TABS: 10.0000 mg | ORAL_TABLET | Freq: Every day | ORAL | 2 refills | Status: DC

## 2021-01-20 MED ORDER — AZELASTINE HCL 0.1 % NA SOLN: 1.0000 | Freq: Two times a day (BID) | NASAL | 2 refills | Status: DC

## 2021-01-20 MED ORDER — TRIAMCINOLONE ACETONIDE 0.1 % EX CREA: 1.0000 "application " | TOPICAL_CREAM | Freq: Two times a day (BID) | CUTANEOUS | 0 refills | Status: DC

## 2021-01-21 DIAGNOSIS — G35 Multiple sclerosis: Secondary | ICD-10-CM | POA: Diagnosis not present

## 2021-01-22 ENCOUNTER — Ambulatory Visit
Admission: RE | Admit: 2021-01-22 | Discharge: 2021-01-22 | Disposition: A | Discharge status: Home / Self Care | Payer: Medicare HMO | Source: Ambulatory Visit | Attending: Family Medicine | Admitting: Family Medicine

## 2021-01-22 ENCOUNTER — Other Ambulatory Visit: Payer: Self-pay

## 2021-01-22 DIAGNOSIS — Z1231 Encounter for screening mammogram for malignant neoplasm of breast: Secondary | ICD-10-CM | POA: Insufficient documentation

## 2021-01-22 DIAGNOSIS — G35 Multiple sclerosis: Secondary | ICD-10-CM | POA: Diagnosis not present

## 2021-01-23 ENCOUNTER — Ambulatory Visit: Payer: Medicare HMO

## 2021-01-23 DIAGNOSIS — G35 Multiple sclerosis: Secondary | ICD-10-CM | POA: Diagnosis not present

## 2021-01-24 DIAGNOSIS — G35 Multiple sclerosis: Secondary | ICD-10-CM | POA: Diagnosis not present

## 2021-01-25 DIAGNOSIS — G35 Multiple sclerosis: Secondary | ICD-10-CM | POA: Diagnosis not present

## 2021-01-26 DIAGNOSIS — G35 Multiple sclerosis: Secondary | ICD-10-CM | POA: Diagnosis not present

## 2021-01-27 DIAGNOSIS — G35 Multiple sclerosis: Secondary | ICD-10-CM | POA: Diagnosis not present

## 2021-01-28 DIAGNOSIS — G35 Multiple sclerosis: Secondary | ICD-10-CM | POA: Diagnosis not present

## 2021-01-28 MED ORDER — AMITRIPTYLINE 25 MG TABLET
ORAL_TABLET | ORAL | 0 refills | 0.00000 days
Start: 2021-01-28 — End: ?

## 2021-01-29 ENCOUNTER — Other Ambulatory Visit: Payer: Self-pay

## 2021-01-29 ENCOUNTER — Encounter: Payer: Self-pay | Admitting: Gastroenterology

## 2021-01-29 DIAGNOSIS — G35 Multiple sclerosis: Secondary | ICD-10-CM | POA: Diagnosis not present

## 2021-01-29 MED ORDER — SUPREP BOWEL PREP KIT 17.5-3.13-1.6 GM/177ML PO SOLN: 1.0000 | ORAL | 0 refills | Status: DC

## 2021-01-30 DIAGNOSIS — G35 Multiple sclerosis: Secondary | ICD-10-CM | POA: Diagnosis not present

## 2021-01-31 DIAGNOSIS — G35 Multiple sclerosis: Secondary | ICD-10-CM | POA: Diagnosis not present

## 2021-02-01 DIAGNOSIS — G35 Multiple sclerosis: Secondary | ICD-10-CM | POA: Diagnosis not present

## 2021-02-02 ENCOUNTER — Ambulatory Visit: Payer: Self-pay

## 2021-02-02 DIAGNOSIS — G35 Multiple sclerosis: Secondary | ICD-10-CM | POA: Diagnosis not present

## 2021-02-02 NOTE — Telephone Encounter (Signed)
Returned pt call.Pt has MS and had been taking amitriptyline (ELAVIL) 25 MG, but was taken off this medication and prescribed Peginterferon Beta-1a (PLEGRIDY).  This new medication has caused her to head and neck muscle spasms and HA.   Pt states she was just in last week and does not want to come in for OV. She would just like to go back to the Elavil.   Please advise.  Pt had questions regarding her medications, stated once she started a new medication for MS she started getting muscle and neck spasms, along with headaches. Pt said she has had some negative reactions from her medications and wanted to discuss with a nurse.          Reason for Disposition . [1] Caller has NON-URGENT medicine question about med that PCP prescribed AND [2] triager unable to answer question  Answer Assessment - Initial Assessment Questions 1. DRUG NAME: "What medicine do you need to have refilled?"  Amitriptyline 2. REFILLS REMAINING: "How many refills are remaining?" (Note: The label on the medicine or pill bottle will show how many refills are remaining. If there are no refills remaining, then a renewal may be needed.)     0 3. EXPIRATION DATE: "What is the expiration date?" (Note: The label states when the prescription will expire, and thus can no longer be refilled.)     NA 4. PRESCRIBING HCP: "Who prescribed it?" Reason: If prescribed by specialist, call should be referred to that group.    Unknown 5. SYMPTOMS: "Do you have any symptoms?"     Head and neck spasms 6. PREGNANCY: "Is there any chance that you are pregnant?" "When was your last menstrual period?"     no  Protocols used: MEDICATION REFILL AND RENEWAL CALL-A-AH

## 2021-02-03 ENCOUNTER — Other Ambulatory Visit: Payer: Self-pay | Admitting: Family Medicine

## 2021-02-03 DIAGNOSIS — G35 Multiple sclerosis: Secondary | ICD-10-CM | POA: Diagnosis not present

## 2021-02-03 MED FILL — PLEGRIDY 125 MCG/0.5 ML SUBCUTANEOUS PEN INJECTOR: SUBCUTANEOUS | 28 days supply | Qty: 1 | Fill #1

## 2021-02-03 NOTE — Telephone Encounter (Signed)
Patient stated she had already spoke to Neurology and they stated it was filled by you last. She stated she had spoke to them about the new symptoms and she will not refill

## 2021-02-04 ENCOUNTER — Other Ambulatory Visit: Payer: Self-pay | Admitting: Family Medicine

## 2021-02-04 DIAGNOSIS — G35 Multiple sclerosis: Secondary | ICD-10-CM | POA: Diagnosis not present

## 2021-02-04 MED ORDER — AMITRIPTYLINE HCL 25 MG PO TABS: 25.0000 mg | ORAL_TABLET | Freq: Every day | ORAL | 1 refills | Status: DC

## 2021-02-04 NOTE — Telephone Encounter (Signed)
Patient stated she has no more medication of the Nortriptyline. Please call elavil in to [facility]

## 2021-02-05 ENCOUNTER — Other Ambulatory Visit
Admission: RE | Admit: 2021-02-05 | Discharge: 2021-02-05 | Disposition: A | Discharge status: Home / Self Care | Payer: Medicare HMO | Source: Ambulatory Visit | Attending: Gastroenterology | Admitting: Gastroenterology

## 2021-02-05 ENCOUNTER — Other Ambulatory Visit: Payer: Self-pay

## 2021-02-05 DIAGNOSIS — Z20822 Contact with and (suspected) exposure to covid-19: Secondary | ICD-10-CM | POA: Insufficient documentation

## 2021-02-05 DIAGNOSIS — G35 Multiple sclerosis: Secondary | ICD-10-CM | POA: Diagnosis not present

## 2021-02-05 DIAGNOSIS — Z01812 Encounter for preprocedural laboratory examination: Secondary | ICD-10-CM | POA: Insufficient documentation

## 2021-02-05 LAB — SARS CORONAVIRUS 2 (TAT 6-24 HRS): SARS Coronavirus 2: NEGATIVE

## 2021-02-05 NOTE — Discharge Instructions (Signed)

## 2021-02-06 DIAGNOSIS — G35 Multiple sclerosis: Secondary | ICD-10-CM | POA: Diagnosis not present

## 2021-02-07 DIAGNOSIS — G35 Multiple sclerosis: Secondary | ICD-10-CM | POA: Diagnosis not present

## 2021-02-08 DIAGNOSIS — G35 Multiple sclerosis: Secondary | ICD-10-CM | POA: Diagnosis not present

## 2021-02-09 ENCOUNTER — Encounter
Admission: RE | Disposition: A | Discharge status: Home / Self Care | Payer: Self-pay | Source: Home / Self Care | Attending: Gastroenterology

## 2021-02-09 ENCOUNTER — Ambulatory Visit: Payer: Medicare HMO | Admitting: Anesthesiology

## 2021-02-09 ENCOUNTER — Ambulatory Visit
Admission: RE | Admit: 2021-02-09 | Discharge: 2021-02-09 | Disposition: A | Discharge status: Home / Self Care | Payer: Medicare HMO | Attending: Gastroenterology | Admitting: Gastroenterology

## 2021-02-09 ENCOUNTER — Encounter: Payer: Self-pay | Admitting: Gastroenterology

## 2021-02-09 ENCOUNTER — Other Ambulatory Visit: Payer: Self-pay

## 2021-02-09 DIAGNOSIS — Z79899 Other long term (current) drug therapy: Secondary | ICD-10-CM | POA: Insufficient documentation

## 2021-02-09 DIAGNOSIS — Z880 Allergy status to penicillin: Secondary | ICD-10-CM | POA: Insufficient documentation

## 2021-02-09 DIAGNOSIS — D124 Benign neoplasm of descending colon: Secondary | ICD-10-CM | POA: Insufficient documentation

## 2021-02-09 DIAGNOSIS — K640 First degree hemorrhoids: Secondary | ICD-10-CM | POA: Diagnosis not present

## 2021-02-09 DIAGNOSIS — Z860100 Personal history of colon polyps, unspecified: Secondary | ICD-10-CM

## 2021-02-09 DIAGNOSIS — K635 Polyp of colon: Secondary | ICD-10-CM

## 2021-02-09 DIAGNOSIS — K219 Gastro-esophageal reflux disease without esophagitis: Secondary | ICD-10-CM | POA: Diagnosis not present

## 2021-02-09 DIAGNOSIS — K5909 Other constipation: Secondary | ICD-10-CM | POA: Insufficient documentation

## 2021-02-09 DIAGNOSIS — G35 Multiple sclerosis: Secondary | ICD-10-CM | POA: Diagnosis not present

## 2021-02-09 DIAGNOSIS — Z1211 Encounter for screening for malignant neoplasm of colon: Secondary | ICD-10-CM | POA: Diagnosis not present

## 2021-02-09 HISTORY — DX: Essential (primary) hypertension: I10

## 2021-02-09 HISTORY — PX: POLYPECTOMY: SHX5525

## 2021-02-09 HISTORY — DX: Family history of other specified conditions: Z84.89

## 2021-02-09 HISTORY — PX: COLONOSCOPY WITH PROPOFOL: SHX5780

## 2021-02-09 LAB — POCT PREGNANCY, URINE: Preg Test, Ur: NEGATIVE

## 2021-02-09 SURGERY — COLONOSCOPY WITH PROPOFOL
Anesthesia: General | Site: Rectum

## 2021-02-09 MED ORDER — AMITRIPTYLINE 25 MG TABLET
ORAL_TABLET | Freq: Every evening | ORAL | 1 refills | 90.00000 days | Status: CP | PRN
Start: 2021-02-09 — End: ?

## 2021-02-09 MED ORDER — LACTATED RINGERS IV SOLN: INTRAVENOUS | Status: DC | PRN

## 2021-02-09 MED ORDER — ACETAMINOPHEN 325 MG PO TABS
325.0000 mg | ORAL_TABLET | ORAL | Status: DC | PRN
Start: 2021-02-09 — End: 2021-02-09

## 2021-02-09 MED ORDER — STERILE WATER FOR IRRIGATION IR SOLN
Status: DC | PRN
  Administered 2021-02-09: 150 mL

## 2021-02-09 MED ORDER — LACTATED RINGERS IV SOLN: INTRAVENOUS | Status: DC

## 2021-02-09 MED ORDER — PROPOFOL 10 MG/ML IV BOLUS
INTRAVENOUS | Status: DC | PRN
  Administered 2021-02-09: 40 mg via INTRAVENOUS
  Administered 2021-02-09 (×2): 30 mg via INTRAVENOUS
  Administered 2021-02-09: 50 mg via INTRAVENOUS

## 2021-02-09 MED ORDER — ACETAMINOPHEN 160 MG/5ML PO SOLN: 325.0000 mg | ORAL | Status: DC | PRN

## 2021-02-09 MED ORDER — ONDANSETRON HCL 4 MG/2ML IJ SOLN: 4.0000 mg | Freq: Once | INTRAMUSCULAR | Status: DC | PRN

## 2021-02-09 MED ORDER — LIDOCAINE HCL (CARDIAC) PF 100 MG/5ML IV SOSY
PREFILLED_SYRINGE | INTRAVENOUS | Status: DC | PRN
  Administered 2021-02-09: 60 mg via INTRAVENOUS

## 2021-02-09 SURGICAL SUPPLY — 22 items
CLIP HMST 235XBRD CATH ROT (MISCELLANEOUS) IMPLANT
CLIP RESOLUTION 360 11X235 (MISCELLANEOUS)
ELECT REM PT RETURN 9FT ADLT (ELECTROSURGICAL)
ELECTRODE REM PT RTRN 9FT ADLT (ELECTROSURGICAL) IMPLANT
FORCEPS BIOP RAD 4 LRG CAP 4 (CUTTING FORCEPS) ×2 IMPLANT
GOWN CVR UNV OPN BCK APRN NK (MISCELLANEOUS) ×2 IMPLANT
GOWN ISOL THUMB LOOP REG UNIV (MISCELLANEOUS) ×4
INJECTOR VARIJECT VIN23 (MISCELLANEOUS) IMPLANT
KIT DEFENDO VALVE AND CONN (KITS) IMPLANT
KIT PRC NS LF DISP ENDO (KITS) ×1 IMPLANT
KIT PROCEDURE OLYMPUS (KITS) ×2
MANIFOLD NEPTUNE II (INSTRUMENTS) ×2 IMPLANT
MARKER SPOT ENDO TATTOO 5ML (MISCELLANEOUS) IMPLANT
PROBE APC STR FIRE (PROBE) IMPLANT
RETRIEVER NET ROTH 2.5X230 LF (MISCELLANEOUS) IMPLANT
SNARE COLD EXACTO (MISCELLANEOUS) IMPLANT
SNARE SHORT THROW 13M SML OVAL (MISCELLANEOUS) IMPLANT
SNARE SNG USE RND 15MM (INSTRUMENTS) IMPLANT
SPOT EX ENDOSCOPIC TATTOO (MISCELLANEOUS)
TRAP ETRAP POLY (MISCELLANEOUS) IMPLANT
VARIJECT INJECTOR VIN23 (MISCELLANEOUS)
WATER STERILE IRR 250ML POUR (IV SOLUTION) ×2 IMPLANT

## 2021-02-09 NOTE — Anesthesia Preprocedure Evaluation (Signed)
Anesthesia Evaluation  Patient identified by MRN, date of birth, ID band Patient awake    Reviewed: Allergy & Precautions, NPO status   Airway Mallampati: II  TM Distance: >3 FB     Dental   Pulmonary    breath sounds clear to auscultation       Cardiovascular hypertension,  Rhythm:Regular Rate:Normal     Neuro/Psych  Headaches, Depression Multiple sclerosis    GI/Hepatic GERD  ,  Endo/Other  Obesity - BMI 30  Renal/GU      Musculoskeletal   Abdominal   Peds  Hematology   Anesthesia Other Findings   Reproductive/Obstetrics                             Anesthesia Physical Anesthesia Plan  ASA: II  Anesthesia Plan: General   Post-op Pain Management:    Induction: Intravenous  PONV Risk Score and Plan: Propofol infusion, TIVA and Treatment may vary due to age or medical condition  Airway Management Planned: Natural Airway and Nasal Cannula  Additional Equipment:   Intra-op Plan:   Post-operative Plan:   Informed Consent: I have reviewed the patients History and Physical, chart, labs and discussed the procedure including the risks, benefits and alternatives for the proposed anesthesia with the patient or authorized representative who has indicated his/her understanding and acceptance.       Plan Discussed with: CRNA  Anesthesia Plan Comments:         Anesthesia Quick Evaluation

## 2021-02-09 NOTE — Anesthesia Procedure Notes (Signed)
Procedure Name: MAC Date/Time: 02/09/2021 8:15 AM Performed by: Dionne Bucy, CRNA Pre-anesthesia Checklist: Patient identified, Emergency Drugs available, Suction available, Patient being monitored and Timeout performed Patient Re-evaluated:Patient Re-evaluated prior to induction Oxygen Delivery Method: Nasal cannula Placement Confirmation: positive ETCO2

## 2021-02-09 NOTE — Anesthesia Postprocedure Evaluation (Signed)
Anesthesia Post Note  Patient: Tammie Lopez  Procedure(s) Performed: COLONOSCOPY WITH PROPOFOL (N/A Rectum) POLYPECTOMY (N/A Rectum)     Patient location during evaluation: PACU Anesthesia Type: General Level of consciousness: awake Pain management: pain level controlled Vital Signs Assessment: post-procedure vital signs reviewed and stable Respiratory status: respiratory function stable Cardiovascular status: stable Postop Assessment: no signs of nausea or vomiting Anesthetic complications: no   No complications documented.  Veda Canning

## 2021-02-09 NOTE — Transfer of Care (Signed)
Immediate Anesthesia Transfer of Care Note  Patient: [name]  Procedure(s) Performed: COLONOSCOPY WITH PROPOFOL (N/A Rectum) POLYPECTOMY (N/A Rectum)  Patient Location: PACU  Anesthesia Type: General  Level of Consciousness: awake, alert  and patient cooperative  Airway and Oxygen Therapy: Patient Spontanous Breathing and Patient connected to supplemental oxygen  Post-op Assessment: Post-op Vital signs reviewed, Patient's Cardiovascular Status Stable, Respiratory Function Stable, Patent Airway and No signs of Nausea or vomiting  Post-op Vital Signs: Reviewed and stable  Complications: No complications documented.

## 2021-02-09 NOTE — Op Note (Signed)
[facility] Gastroenterology Patient Name: Tammie Lopez Procedure Date: 02/09/2021 8:15 AM MRN: [id number] Account #: [id number] Date of Birth: [birth year] Admit Type: Outpatient Age: 50 Room: [facility] OR ROOM 01 Gender: Female Note Status: Finalized Procedure:             Colonoscopy Indications:           High risk colon cancer surveillance: Personal history                         of colonic polyps Providers:             [name] MD, MD Referring MD:          Tammie Roys. Sowles, MD (Referring MD) Medicines:             Propofol per Anesthesia Complications:         No immediate complications. Procedure:             Pre-Anesthesia Assessment:                        - Prior to the procedure, a History and Physical was                         performed, and patient medications and allergies were                         reviewed. The patient's tolerance of previous                         anesthesia was also reviewed. The risks and benefits                         of the procedure and the sedation options and risks                         were discussed with the patient. All questions were                         answered, and informed consent was obtained. Prior                         Anticoagulants: The patient has taken no previous                         anticoagulant or antiplatelet agents. ASA Grade                         Assessment: II - A patient with mild systemic disease.                         After reviewing the risks and benefits, the patient                         was deemed in satisfactory condition to undergo the                         procedure.  After obtaining informed consent, the colonoscope was                         passed under direct vision. Throughout the procedure,                         the patient's blood pressure, pulse, and oxygen                         saturations were monitored continuously. The was                          introduced through the anus and advanced to the the                         cecum, identified by appendiceal orifice and ileocecal                         valve. The colonoscopy was performed without                         difficulty. The patient tolerated the procedure well.                         The quality of the bowel preparation was excellent. Findings:      The perianal and digital rectal examinations were normal.      A 2 mm polyp was found in the descending colon. The polyp was sessile.       The polyp was removed with a cold biopsy forceps. Resection and       retrieval were complete.      Non-bleeding internal hemorrhoids were found during retroflexion. The       hemorrhoids were Grade I (internal hemorrhoids that do not prolapse). Impression:            - One 2 mm polyp in the descending colon, removed with                         a cold biopsy forceps. Resected and retrieved.                        - Non-bleeding internal hemorrhoids. Recommendation:        - Discharge patient to home.                        - Resume previous diet.                        - Repeat colonoscopy in 7 years for surveillance. Procedure Code(s):     --- Professional ---                        [phone], Colonoscopy, flexible; with biopsy, single or                         multiple Diagnosis Code(s):     --- Professional ---                        Z86.010, Personal history of colonic polyps  K63.5, Polyp of colon CPT copyright 2019 American Medical Association. All rights reserved. The codes documented in this report are preliminary and upon coder review may  be revised to meet current compliance requirements. [name] MD, MD 02/09/2021 8:38:49 AM This report has been signed electronically. Number of Addenda: 0 Note Initiated On: 02/09/2021 8:15 AM Scope Withdrawal Time: 0 hours 8 minutes 2 seconds  Total Procedure Duration: 0 hours 11 minutes 25 seconds   Estimated Blood Loss:  Estimated blood loss: none. Estimated blood loss: none.      [facility]

## 2021-02-09 NOTE — Interval H&P Note (Signed)
[name], MD [facility] [address], [place], [place] [address] [phone] Fax : [phone]  Primary Care Physician:  Steele Sizer, MD Primary Gastroenterologist:  Dr. Allen Norris  Pre-Procedure History & Physical: HPI:  Tammie Lopez is a 50 y.o. female is here for an colonoscopy.   Past Medical History:  Diagnosis Date   Allergy    Constipation    Diverticula of colon    Family history of adverse reaction to anesthesia    mother's BP increased   Hypertension    Insomnia    Migraine    migraines   MS (multiple sclerosis) (HCC)    Muscle spasm     Past Surgical History:  Procedure Laterality Date   BREAST BIOPSY Left 01/06/2018    2 areas  neg   BUNIONECTOMY     CESAREAN SECTION N/A 2006   GANGLION CYST EXCISION Bilateral    Hands    Prior to Admission medications   Medication Sig Start Date End Date Taking? Authorizing Provider  amitriptyline (ELAVIL) 25 MG tablet Take 1 tablet (25 mg total) by mouth at bedtime. 02/04/21  Yes Sowles, Drue Stager, MD  amLODipine (NORVASC) 2.5 MG tablet Take 1 tablet (2.5 mg total) by mouth daily. 01/20/21  Yes Sowles, Drue Stager, MD  azelastine (ASTELIN) 0.1 % nasal spray Place 1 spray into both nostrils 2 (two) times daily. Use in each nostril as directed 01/20/21  Yes Sowles, Drue Stager, MD  Cholecalciferol (VITAMIN D) 50 MCG (2000 UT) CAPS Take 50,000 Units by mouth once a week.   Yes [provider]  cyclobenzaprine (FLEXERIL) 10 MG tablet Take 0.5-1 tablets (5-10 mg total) by mouth at bedtime as needed for muscle spasms. 10/15/20  Yes Sowles, Drue Stager, MD  LINZESS 290 MCG CAPS capsule TAKE 1 CAPSULE (290 MCG TOTAL) BY MOUTH DAILY. 04/07/20  Yes Sowles, Drue Stager, MD  loratadine (CLARITIN) 10 MG tablet Take 1 tablet (10 mg total) by mouth daily. 01/20/21  Yes Sowles, Drue Stager, MD  Na Sulfate-K Sulfate-Mg Sulf (SUPREP BOWEL PREP KIT) 17.5-3.13-1.6 GM/177ML SOLN Take 1 kit by mouth as directed. 01/29/21  Yes [name], MD   omeprazole (PRILOSEC) 40 MG capsule TAKE 1 CAPSULE BY MOUTH EVERY DAY 03/02/20  Yes Sowles, Drue Stager, MD  SUMAtriptan (IMITREX) 100 MG tablet Take 1 tablet (100 mg total) by mouth every 2 (two) hours as needed for migraine. May repeat in 2 hours if headache persists or recurs. 10/15/20  Yes Sowles, Drue Stager, MD  triamcinolone (KENALOG) 0.1 % Apply 1 application topically 2 (two) times daily. 01/20/21  Yes Sowles, Drue Stager, MD  Peginterferon Beta-1a (PLEGRIDY) 125 MCG/0.5ML SOPN Inject into the skin. Inject the contents of 1 pen (125 mcg) under the skin every fourteen (14) days. 11/25/20   [provider]    Allergies as of 01/14/2021 - Review Complete 01/14/2021  Allergen Reaction Noted   Penicillins Hives 08/26/2015    Family History  Problem Relation Age of Onset   Diabetes Mother    Hypertension Mother    Cancer Father 50       Mouth   COPD Father    Hypertension Brother    Diabetes Brother    Breast cancer Neg Hx     Social History   Socioeconomic History   Marital status: Single    Spouse name: Not on file   Number of children: 3   Years of education: Not on file   Highest education level: High school graduate  Occupational History    Comment:  bus driver  Tobacco Use   Smoking status: Never Smoker   Smokeless tobacco: Never Used   Tobacco comment: smoking cessation materials not required  Vaping Use   Vaping Use: Never used  Substance and Sexual Activity   Alcohol use: No   Drug use: No   Sexual activity: Not Currently    Partners: Male  Other Topics Concern   Not on file  Social History Narrative   She is on disability for MS, able to work 20 hours per week, 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   Not currently working due to [employer]    Social Determinants of Radio broadcast assistant Strain: Low Risk    Difficulty of Paying Living Expenses: Not hard at all  Food Insecurity: No Food Insecurity   Worried About Charity fundraiser in the Last Year:  Never true   Arboriculturist in the Last Year: Never true  Transportation Needs: No Transportation Needs   Lack of Transportation (Medical): No   Lack of Transportation (Non-Medical): No  Physical Activity: Inactive   Days of Exercise per Week: 0 days   Minutes of Exercise per Session: 0 min  Stress: No Stress Concern Present   Feeling of Stress : Not at all  Social Connections: Moderately Isolated   Frequency of Communication with Friends and Family: More than three times a week   Frequency of Social Gatherings with Friends and Family: More than three times a week   Attends Religious Services: More than 4 times per year   Active Member of [employer] or Organizations: No   Attends Archivist Meetings: Never   Marital Status: Never married  Human resources officer Violence: Not At Risk   Fear of Current or Ex-Partner: No   Emotionally Abused: No   Physically Abused: No   Sexually Abused: No    Review of Systems: See HPI, otherwise negative ROS  Physical Exam: BP (!) 140/103   Pulse 75   Temp 97.8 F (36.6 C) (Temporal)   Resp 18   Ht '5\' 2"'  (1.575 m)   Wt 72.6 kg   LMP 01/27/2021 (Approximate) Comment: upreg neg  SpO2 100%   BMI 29.26 kg/m  General:   Alert,  pleasant and cooperative in NAD Head:  Normocephalic and atraumatic. Neck:  Supple; no masses or thyromegaly. Lungs:  Clear throughout to auscultation.    Heart:  Regular rate and rhythm. Abdomen:  Soft, nontender and nondistended. Normal bowel sounds, without guarding, and without rebound.   Neurologic:  Alert and  oriented x4;  grossly normal neurologically.  Impression/Plan: Tammie Lopez is here for an colonoscopy to be performed for a history of adenomatous polyps on 10/01/2013   Risks, benefits, limitations, and alternatives regarding  colonoscopy have been reviewed with the patient.  Questions have been answered.  All parties agreeable.   [name], MD  02/09/2021, 8:17 AM

## 2021-02-10 DIAGNOSIS — G35 Multiple sclerosis: Secondary | ICD-10-CM | POA: Diagnosis not present

## 2021-02-11 ENCOUNTER — Encounter: Payer: Self-pay | Admitting: Gastroenterology

## 2021-02-11 DIAGNOSIS — G35 Multiple sclerosis: Secondary | ICD-10-CM | POA: Diagnosis not present

## 2021-02-11 LAB — SURGICAL PATHOLOGY

## 2021-02-12 ENCOUNTER — Encounter: Payer: Self-pay | Admitting: Gastroenterology

## 2021-02-12 DIAGNOSIS — G35 Multiple sclerosis: Secondary | ICD-10-CM | POA: Diagnosis not present

## 2021-02-13 DIAGNOSIS — G35 Multiple sclerosis: Secondary | ICD-10-CM | POA: Diagnosis not present

## 2021-02-14 DIAGNOSIS — G35 Multiple sclerosis: Secondary | ICD-10-CM | POA: Diagnosis not present

## 2021-02-15 DIAGNOSIS — G35 Multiple sclerosis: Secondary | ICD-10-CM | POA: Diagnosis not present

## 2021-02-16 DIAGNOSIS — G35 Multiple sclerosis: Secondary | ICD-10-CM | POA: Diagnosis not present

## 2021-02-17 DIAGNOSIS — G35 Multiple sclerosis: Secondary | ICD-10-CM | POA: Diagnosis not present

## 2021-02-18 DIAGNOSIS — G35 Multiple sclerosis: Secondary | ICD-10-CM | POA: Diagnosis not present

## 2021-02-19 DIAGNOSIS — G35 Multiple sclerosis: Secondary | ICD-10-CM | POA: Diagnosis not present

## 2021-02-20 DIAGNOSIS — G35 Multiple sclerosis: Secondary | ICD-10-CM | POA: Diagnosis not present

## 2021-02-21 DIAGNOSIS — G35 Multiple sclerosis: Secondary | ICD-10-CM | POA: Diagnosis not present

## 2021-02-22 DIAGNOSIS — G35 Multiple sclerosis: Secondary | ICD-10-CM | POA: Diagnosis not present

## 2021-02-23 DIAGNOSIS — G35 Multiple sclerosis: Secondary | ICD-10-CM | POA: Diagnosis not present

## 2021-02-24 DIAGNOSIS — G35 Multiple sclerosis: Secondary | ICD-10-CM | POA: Diagnosis not present

## 2021-02-25 DIAGNOSIS — G35 Multiple sclerosis: Secondary | ICD-10-CM | POA: Diagnosis not present

## 2021-02-26 DIAGNOSIS — G35 Multiple sclerosis: Secondary | ICD-10-CM | POA: Diagnosis not present

## 2021-02-27 DIAGNOSIS — G35 Multiple sclerosis: Secondary | ICD-10-CM | POA: Diagnosis not present

## 2021-02-28 DIAGNOSIS — G35 Multiple sclerosis: Secondary | ICD-10-CM | POA: Diagnosis not present

## 2021-03-01 DIAGNOSIS — G35 Multiple sclerosis: Secondary | ICD-10-CM | POA: Diagnosis not present

## 2021-03-02 DIAGNOSIS — G35 Multiple sclerosis: Secondary | ICD-10-CM | POA: Diagnosis not present

## 2021-03-03 DIAGNOSIS — G35 Multiple sclerosis: Secondary | ICD-10-CM | POA: Diagnosis not present

## 2021-03-04 DIAGNOSIS — G35 Multiple sclerosis: Secondary | ICD-10-CM | POA: Diagnosis not present

## 2021-03-05 DIAGNOSIS — G35 Multiple sclerosis: Secondary | ICD-10-CM | POA: Diagnosis not present

## 2021-03-05 MED FILL — PLEGRIDY 125 MCG/0.5 ML SUBCUTANEOUS PEN INJECTOR: SUBCUTANEOUS | 28 days supply | Qty: 1 | Fill #2

## 2021-03-06 DIAGNOSIS — G35 Multiple sclerosis: Secondary | ICD-10-CM | POA: Diagnosis not present

## 2021-03-07 DIAGNOSIS — G35 Multiple sclerosis: Secondary | ICD-10-CM | POA: Diagnosis not present

## 2021-03-08 DIAGNOSIS — G35 Multiple sclerosis: Secondary | ICD-10-CM | POA: Diagnosis not present

## 2021-03-09 DIAGNOSIS — G35 Multiple sclerosis: Secondary | ICD-10-CM | POA: Diagnosis not present

## 2021-03-10 DIAGNOSIS — G35 Multiple sclerosis: Secondary | ICD-10-CM | POA: Diagnosis not present

## 2021-03-11 DIAGNOSIS — G35 Multiple sclerosis: Secondary | ICD-10-CM | POA: Diagnosis not present

## 2021-03-12 DIAGNOSIS — G35 Multiple sclerosis: Secondary | ICD-10-CM | POA: Diagnosis not present

## 2021-03-13 DIAGNOSIS — G35 Multiple sclerosis: Secondary | ICD-10-CM | POA: Diagnosis not present

## 2021-03-14 DIAGNOSIS — G35 Multiple sclerosis: Secondary | ICD-10-CM | POA: Diagnosis not present

## 2021-03-15 DIAGNOSIS — G35 Multiple sclerosis: Secondary | ICD-10-CM | POA: Diagnosis not present

## 2021-03-16 DIAGNOSIS — G35 Multiple sclerosis: Secondary | ICD-10-CM | POA: Diagnosis not present

## 2021-03-17 DIAGNOSIS — G35 Multiple sclerosis: Secondary | ICD-10-CM | POA: Diagnosis not present

## 2021-03-18 DIAGNOSIS — G35 Multiple sclerosis: Secondary | ICD-10-CM | POA: Diagnosis not present

## 2021-03-19 DIAGNOSIS — G35 Multiple sclerosis: Secondary | ICD-10-CM | POA: Diagnosis not present

## 2021-03-20 DIAGNOSIS — G35 Multiple sclerosis: Secondary | ICD-10-CM | POA: Diagnosis not present

## 2021-03-21 DIAGNOSIS — G35 Multiple sclerosis: Secondary | ICD-10-CM | POA: Diagnosis not present

## 2021-03-22 DIAGNOSIS — G35 Multiple sclerosis: Secondary | ICD-10-CM | POA: Diagnosis not present

## 2021-03-23 DIAGNOSIS — G35 Multiple sclerosis: Secondary | ICD-10-CM | POA: Diagnosis not present

## 2021-03-24 DIAGNOSIS — G35 Multiple sclerosis: Secondary | ICD-10-CM | POA: Diagnosis not present

## 2021-03-25 DIAGNOSIS — G35 Multiple sclerosis: Secondary | ICD-10-CM | POA: Diagnosis not present

## 2021-03-26 DIAGNOSIS — G35 Multiple sclerosis: Secondary | ICD-10-CM | POA: Diagnosis not present

## 2021-03-27 DIAGNOSIS — G35 Multiple sclerosis: Secondary | ICD-10-CM | POA: Diagnosis not present

## 2021-03-28 DIAGNOSIS — G35 Multiple sclerosis: Secondary | ICD-10-CM | POA: Diagnosis not present

## 2021-03-29 DIAGNOSIS — G35 Multiple sclerosis: Secondary | ICD-10-CM | POA: Diagnosis not present

## 2021-03-30 DIAGNOSIS — G35 Multiple sclerosis: Secondary | ICD-10-CM | POA: Diagnosis not present

## 2021-03-31 DIAGNOSIS — G35 Multiple sclerosis: Secondary | ICD-10-CM | POA: Diagnosis not present

## 2021-04-01 DIAGNOSIS — G35 Multiple sclerosis: Secondary | ICD-10-CM | POA: Diagnosis not present

## 2021-04-02 DIAGNOSIS — G35 Multiple sclerosis: Secondary | ICD-10-CM | POA: Diagnosis not present

## 2021-04-02 MED FILL — PLEGRIDY 125 MCG/0.5 ML SUBCUTANEOUS PEN INJECTOR: SUBCUTANEOUS | 28 days supply | Qty: 1 | Fill #3

## 2021-04-03 DIAGNOSIS — G35 Multiple sclerosis: Secondary | ICD-10-CM | POA: Diagnosis not present

## 2021-04-04 ENCOUNTER — Other Ambulatory Visit: Payer: Self-pay | Admitting: Family Medicine

## 2021-04-04 DIAGNOSIS — K5909 Other constipation: Secondary | ICD-10-CM

## 2021-04-04 DIAGNOSIS — G35 Multiple sclerosis: Secondary | ICD-10-CM | POA: Diagnosis not present

## 2021-04-04 NOTE — Telephone Encounter (Signed)
Requested Prescriptions  Pending Prescriptions Disp Refills  . LINZESS 290 MCG CAPS capsule [Pharmacy Med Name: LINZESS 290 MCG CAPSULE] 30 capsule 0    Sig: TAKE 1 CAPSULE (290 MCG TOTAL) BY MOUTH DAILY.     Gastroenterology: Irritable Bowel Syndrome Passed - 04/04/2021  7:26 AM      Passed - Valid encounter within last 12 months    Recent Outpatient Visits          2 months ago Other neutropenia [facility])   [facility] Steele Sizer, MD   5 months ago Other neutropenia [facility])   [facility] Steele Sizer, MD   11 months ago Other neutropenia [facility])   [facility] Steele Sizer, MD   1 year ago Productive cough   [facility] Steele Sizer, MD   1 year ago Breakthrough bleeding   [facility] Steele Sizer, MD      Future Appointments            In 2 weeks Steele Sizer, MD [facility], [place]   In 2 months Steele Sizer, MD [facility], [place]   In 8 months  [facility], [facility]

## 2021-04-05 DIAGNOSIS — G35 Multiple sclerosis: Secondary | ICD-10-CM | POA: Diagnosis not present

## 2021-04-06 DIAGNOSIS — G35 Multiple sclerosis: Secondary | ICD-10-CM | POA: Diagnosis not present

## 2021-04-07 DIAGNOSIS — G35 Multiple sclerosis: Secondary | ICD-10-CM | POA: Diagnosis not present

## 2021-04-08 DIAGNOSIS — G35 Multiple sclerosis: Secondary | ICD-10-CM | POA: Diagnosis not present

## 2021-04-09 DIAGNOSIS — G35 Multiple sclerosis: Secondary | ICD-10-CM | POA: Diagnosis not present

## 2021-04-10 DIAGNOSIS — G35 Multiple sclerosis: Secondary | ICD-10-CM | POA: Diagnosis not present

## 2021-04-11 DIAGNOSIS — G35 Multiple sclerosis: Secondary | ICD-10-CM | POA: Diagnosis not present

## 2021-04-12 DIAGNOSIS — G35 Multiple sclerosis: Secondary | ICD-10-CM | POA: Diagnosis not present

## 2021-04-13 DIAGNOSIS — G35 Multiple sclerosis: Secondary | ICD-10-CM | POA: Diagnosis not present

## 2021-04-14 DIAGNOSIS — G35 Multiple sclerosis: Secondary | ICD-10-CM | POA: Diagnosis not present

## 2021-04-15 DIAGNOSIS — G35 Multiple sclerosis: Secondary | ICD-10-CM | POA: Diagnosis not present

## 2021-04-16 ENCOUNTER — Other Ambulatory Visit: Payer: Self-pay | Admitting: Family Medicine

## 2021-04-16 DIAGNOSIS — G35 Multiple sclerosis: Secondary | ICD-10-CM | POA: Diagnosis not present

## 2021-04-16 DIAGNOSIS — J302 Other seasonal allergic rhinitis: Secondary | ICD-10-CM

## 2021-04-17 ENCOUNTER — Other Ambulatory Visit: Payer: Self-pay | Admitting: Family Medicine

## 2021-04-17 DIAGNOSIS — I10 Essential (primary) hypertension: Secondary | ICD-10-CM

## 2021-04-17 DIAGNOSIS — G35 Multiple sclerosis: Secondary | ICD-10-CM | POA: Diagnosis not present

## 2021-04-17 NOTE — Telephone Encounter (Signed)
Requested Prescriptions  Pending Prescriptions Disp Refills  . amLODipine (NORVASC) 2.5 MG tablet [Pharmacy Med Name: AMLODIPINE BESYLATE 2.5 MG TAB] 90 tablet 0    Sig: TAKE 1 TABLET BY MOUTH EVERY DAY     Cardiovascular:  Calcium Channel Blockers Failed - 04/17/2021  1:34 AM      Failed - Last BP in normal range    BP Readings from Last 1 Encounters:  02/09/21 (!) 135/95         Passed - Valid encounter within last 6 months    Recent Outpatient Visits          2 months ago Other neutropenia [facility]   [facility] Steele Sizer, MD   6 months ago Other neutropenia [facility])   [facility] Steele Sizer, MD   12 months ago Other neutropenia [facility])   [facility] Steele Sizer, MD   1 year ago Productive cough   [facility] Steele Sizer, MD   1 year ago Breakthrough bleeding   [facility] Steele Sizer, MD      Future Appointments            In 4 days Steele Sizer, MD [facility], [place]   In 1 month Steele Sizer, MD [facility], [place]   In 8 months  [facility], [facility]

## 2021-04-18 DIAGNOSIS — G35 Multiple sclerosis: Secondary | ICD-10-CM | POA: Diagnosis not present

## 2021-04-19 DIAGNOSIS — G35 Multiple sclerosis: Secondary | ICD-10-CM | POA: Diagnosis not present

## 2021-04-20 DIAGNOSIS — G35 Multiple sclerosis: Secondary | ICD-10-CM | POA: Diagnosis not present

## 2021-04-20 NOTE — Progress Notes (Deleted)
Name: Tammie Lopez   MRN: [id number]    DOB: 1971-01-30   Date:04/20/2021       Progress Note  Subjective  Chief Complaint  Follow up  HPI  HTN: patient has a history of hypertension but bp normalized and has been off medications for months. 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 She has not been checking her bp at home  Hot flashes: intense and worse at night, she has to get up and take a shower. We gave her clonidine but she decided not to take it, symptoms resolved, however LMP was 12/18/2020 , negative pregnancy test today. She would like to resume birth control since sexually active , explained we need to get bp lower, discussed IUD sine progesterone only   MS: she has been off Tacfidera due to leucopenia, she goes to [facility] neurology, she is taking a new medication Plegridy, she states it makes her feel weird.   Obesity: history of insulin resistance, took Trulicity in the past and also Metformin, she has lost 10 lbs since last visit, she has not been drinking sodas since Nov and has been using exercise machine at home. Continue life style modifications  Eczema: worse lately, very pruriginous rash on her abdominal area, using a cream ( not sure of the name) and Cerave cream.   Chronic constipation/GERD: she is always feeling bloated, still takes Linzess prn, seeing Dr. Durwin Reges and scheduled for colonoscopy   AR: she has recurrent episodes of nasal congestion and facial pressure for the past week, no fever or chills, she has occasional post-nasal drainage. She is taking Sudafed prn, explained cannot take it due to elevated bp   Patient Active Problem List   Diagnosis Date Noted  . Personal history of colonic polyps   . Polyp of descending colon   . Eczema 01/20/2021  . 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 Low TSH level  09/19/2019  . Obesity (BMI 30-39.9) 12/06/2017  . Insulin resistance 09/27/2017  . Essential hypertension 09/21/2017  . Right sided sciatica 09/21/2017  . Major depression, recurrent ([place]) 05/24/2017  . Migraine without aura, not intractable 12/09/2016  . Nickel allergy 12/09/2016  . GERD without esophagitis 12/09/2016  . Other neutropenia ([place]) 12/09/2016  . Dyslipidemia 12/09/2016  . Diverticulosis 12/09/2016  . 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 Allergic rhinitis, seasonal 12/09/2016  . Chronic constipation 12/09/2016  . Vitamin D deficiency 08/06/2013  . MS (multiple sclerosis) ([place]) 04/17/2013    Past Surgical History:  Procedure Laterality Date  . BREAST BIOPSY Left 01/06/2018    2 areas  neg  . BUNIONECTOMY    . CESAREAN SECTION N/A 2006  . COLONOSCOPY WITH PROPOFOL N/A 02/09/2021   Procedure: COLONOSCOPY WITH PROPOFOL;  Surgeon: [name], MD;  Location: [place];  Service: Endoscopy;  Laterality: N/A;  . GANGLION CYST EXCISION Bilateral    Hands  . POLYPECTOMY N/A 02/09/2021   Procedure: POLYPECTOMY;  Surgeon: [name], MD;  Location: [place];  Service: Endoscopy;  Laterality: N/A;    Family History  Problem Relation Age of Onset  . Diabetes Mother   . Hypertension Mother   . Cancer Father 75       Mouth  . COPD Father   . Hypertension Brother   . Diabetes Brother   . Breast cancer Neg Hx  Social History   Tobacco Use  . Smoking status: Never Smoker  . Smokeless tobacco: Never Used  . Tobacco comment: smoking cessation materials not required  Substance Use Topics  . Alcohol use: No     Current Outpatient Medications:  .  amitriptyline (ELAVIL) 25 MG tablet, Take 1 tablet (25 mg total) by mouth at bedtime., Disp: 90 tablet, Rfl: 1 .  amLODipine (NORVASC) 2.5 MG tablet, TAKE 1 TABLET BY MOUTH EVERY DAY, Disp: 90 tablet, Rfl: 0 .  azelastine (ASTELIN) 0.1 % nasal spray, PLACE 1 SPRAY INTO BOTH NOSTRILS 2 (TWO) TIMES DAILY. USE IN  EACH NOSTRIL AS DIRECTED, Disp: 30 mL, Rfl: 0 .  Cholecalciferol (VITAMIN D) 50 MCG (2000 UT) CAPS, Take 50,000 Units by mouth once a week., Disp: , Rfl:  .  cyclobenzaprine (FLEXERIL) 10 MG tablet, Take 0.5-1 tablets (5-10 mg total) by mouth at bedtime as needed for muscle spasms., Disp: 30 tablet, Rfl: 1 .  LINZESS 290 MCG CAPS capsule, TAKE 1 CAPSULE (290 MCG TOTAL) BY MOUTH DAILY., Disp: 30 capsule, Rfl: 0 .  loratadine (CLARITIN) 10 MG tablet, TAKE 1 TABLET BY MOUTH EVERY DAY, Disp: 90 tablet, Rfl: 0 .  Na Sulfate-K Sulfate-Mg Sulf (SUPREP BOWEL PREP KIT) 17.5-3.13-1.6 GM/177ML SOLN, Take 1 kit by mouth as directed., Disp: 354 mL, Rfl: 0 .  omeprazole (PRILOSEC) 40 MG capsule, TAKE 1 CAPSULE BY MOUTH EVERY DAY, Disp: 90 capsule, Rfl: 0 .  Peginterferon Beta-1a (PLEGRIDY) 125 MCG/0.5ML SOPN, Inject into the skin. Inject the contents of 1 pen (125 mcg) under the skin every fourteen (14) days., Disp: , Rfl:  .  SUMAtriptan (IMITREX) 100 MG tablet, Take 1 tablet (100 mg total) by mouth every 2 (two) hours as needed for migraine. May repeat in 2 hours if headache persists or recurs., Disp: 9 tablet, Rfl: 1 .  triamcinolone (KENALOG) 0.1 %, Apply 1 application topically 2 (two) times daily., Disp: 453.6 g, Rfl: 0  Allergies  Allergen Reactions  . Penicillins Hives    I personally reviewed {Reviewed:[id number]} with the patient/caregiver today.   ROS  ***  Objective  There were no vitals filed for this visit.  There is no height or weight on file to calculate BMI.  Physical Exam ***  Recent Results (from the past 2160 hour(s))  POCT urine pregnancy     Status: Normal   Collection Time: 01/20/21  9:56 AM  Result Value Ref Range   Preg Test, Ur Negative Negative  SARS CORONAVIRUS 2 (TAT 6-24 HRS) Nasopharyngeal Nasopharyngeal Swab     Status: None   Collection Time: 02/05/21  8:27 AM   Specimen: Nasopharyngeal Swab  Result Value Ref Range   SARS Coronavirus 2 NEGATIVE NEGATIVE     Comment: (NOTE) SARS-CoV-2 target nucleic acids are NOT DETECTED.  The SARS-CoV-2 RNA is generally detectable in upper and lower respiratory specimens during the acute phase of infection. Negative results do not preclude SARS-CoV-2 infection, do not rule out co-infections with other pathogens, and should not be used as the sole basis for treatment or other patient management decisions. Negative results must be combined with clinical observations, patient history, and epidemiological information. The expected result is Negative.  Fact Sheet for Patients: SugarRoll.be  Fact Sheet for Healthcare Providers: https://www.woods-mathews.com/  This test is not yet approved or cleared by the [place] FDA and  has been authorized for detection and/or diagnosis of SARS-CoV-2 by FDA under an Emergency Use Authorization (EUA). This EUA will remain  in  effect (meaning this test can be used) for the duration of the COVID-19 declaration under Se ction 564(b)(1) of the Act, 21 U.S.C. section 360bbb-3(b)(1), unless the authorization is terminated or revoked sooner.  Performed at [facility], [place] [address] [place], [place] [address]   Pregnancy, urine POC     Status: None   Collection Time: 02/09/21  7:55 AM  Result Value Ref Range   Preg Test, Ur NEGATIVE NEGATIVE    Comment:        THE SENSITIVITY OF THIS METHODOLOGY IS >24 mIU/mL   Surgical pathology     Status: None   Collection Time: 02/09/21  8:04 AM  Result Value Ref Range   SURGICAL PATHOLOGY      SURGICAL PATHOLOGY CASE: [id number] PATIENT: [facility] Surgical Pathology Report     Specimen Submitted: A. Colon polyp, descending; cbx  Clinical History: HX of colon polyp Z86.010.  Colon polyps    DIAGNOSIS: A. COLON POLYP, DESCENDING; COLD BIOPSY: - TUBULAR ADENOMA. - NEGATIVE FOR HIGH-GRADE DYSPLASIA AND MALIGNANCY.  GROSS DESCRIPTION: A. Labeled: Descending  colon polyp (per requisition cold biopsy) Received: Formalin Collection time: 8:04 AM on 02/09/2021 Placed into formalin time: 8:04 AM on 02/09/2021 Tissue fragment(s): 2 Size: Each 0.4 cm Description: Tan soft tissue fragments Entirely submitted in 1 cassette.  RB 02/10/2021   Final Diagnosis performed by Allena Napoleon, MD.   Electronically signed 02/11/2021 9:48:56AM The electronic signature indicates that the named Attending Pathologist has evaluated the specimen Technical component performed at [facility], [address] [place], [place] [address] Lab: [phone] Dir: Rush Farmer, MD, M MM  Professional component performed at [facility], [facility], [place], [place], [place] [address] Lab: [phone] Dir: Dellia Nims. Rubinas, MD     Diabetic Foot Exam: Diabetic Foot Exam - Simple   No data filed    ***  PHQ2/9: Depression screen [facility] 2/9 01/20/2021 12/18/2020 10/15/2020 04/21/2020 11/06/2019  Decreased Interest 0 0 0 0 0  Down, Depressed, Hopeless 0 0 0 0 0  PHQ - 2 Score 0 0 0 0 0  Altered sleeping - - - 0 0  Tired, decreased energy - - - 0 0  Change in appetite - - - 0 0  Feeling bad or failure about yourself  - - - 0 0  Trouble concentrating - - - 0 0  Moving slowly or fidgety/restless - - - 0 0  Suicidal thoughts - - - 0 0  PHQ-9 Score - - - 0 0  Difficult doing work/chores - - - - -  Some recent data might be hidden    phq 9 is {gen pos [id number]} ***  Fall Risk: Fall Risk  01/20/2021 12/18/2020 10/15/2020 10/15/2020 04/21/2020  Falls in the past year? 0 0 0 0 0  Number falls in past yr: 0 0 0 0 0  Injury with Fall? 0 0 0 0 0  Risk for fall due to : - No Fall Risks - - -  Follow up - Falls prevention discussed - - -   ***   Functional Status Survey:   ***   Assessment & Plan  *** There are no diagnoses linked to this encounter.

## 2021-04-21 ENCOUNTER — Ambulatory Visit: Payer: Medicare HMO | Admitting: Family Medicine

## 2021-04-21 DIAGNOSIS — G35 Multiple sclerosis: Secondary | ICD-10-CM | POA: Diagnosis not present

## 2021-04-22 DIAGNOSIS — G35 Multiple sclerosis: Secondary | ICD-10-CM | POA: Diagnosis not present

## 2021-04-23 DIAGNOSIS — G35 Multiple sclerosis: Secondary | ICD-10-CM | POA: Diagnosis not present

## 2021-04-24 DIAGNOSIS — G35 Multiple sclerosis: Secondary | ICD-10-CM | POA: Diagnosis not present

## 2021-04-25 DIAGNOSIS — G35 Multiple sclerosis: Secondary | ICD-10-CM | POA: Diagnosis not present

## 2021-04-26 DIAGNOSIS — G35 Multiple sclerosis: Secondary | ICD-10-CM | POA: Diagnosis not present

## 2021-04-27 DIAGNOSIS — G35 Multiple sclerosis: Secondary | ICD-10-CM | POA: Diagnosis not present

## 2021-04-28 DIAGNOSIS — G35 Multiple sclerosis: Secondary | ICD-10-CM | POA: Diagnosis not present

## 2021-04-29 DIAGNOSIS — G35 Multiple sclerosis: Secondary | ICD-10-CM | POA: Diagnosis not present

## 2021-04-30 DIAGNOSIS — G35 Multiple sclerosis: Secondary | ICD-10-CM | POA: Diagnosis not present

## 2021-05-01 ENCOUNTER — Other Ambulatory Visit: Payer: Self-pay | Admitting: Family Medicine

## 2021-05-01 DIAGNOSIS — G35 Multiple sclerosis: Secondary | ICD-10-CM | POA: Diagnosis not present

## 2021-05-01 DIAGNOSIS — J302 Other seasonal allergic rhinitis: Secondary | ICD-10-CM

## 2021-05-01 NOTE — Telephone Encounter (Signed)
Requested medication (s) are due for refill today: yes  Requested medication (s) are on the active medication list: yes  Last refill:  04/16/21 #30 0 refills  Future visit scheduled: yes  Notes to clinic:  Pharmacy comment: [place].     Requested Prescriptions  Pending Prescriptions Disp Refills   Azelastine HCl 137 MCG/SPRAY SOLN [Pharmacy Med Name: AZELASTINE 0.1% (137 MCG) SPRY]  1    Sig: PLACE 1 SPRAY INTO BOTH NOSTRILS 2 (TWO) TIMES DAILY. USE IN EACH NOSTRIL AS DIRECTED      Ear, Nose, and Throat: Nasal Preparations - Antiallergy Passed - 05/01/2021 12:22 PM      Passed - Valid encounter within last 12 months    Recent Outpatient Visits           3 months ago Other neutropenia [facility])   [facility] Steele Sizer, MD   6 months ago Other neutropenia [facility])   [facility] Steele Sizer, MD   1 year ago Other neutropenia [facility])   [facility] Steele Sizer, MD   1 year ago Productive cough   [facility] Steele Sizer, MD   1 year ago Breakthrough bleeding   [facility] Steele Sizer, MD       Future Appointments             In 1 month Ancil Boozer, Drue Stager, MD [facility], [place]   In 7 months  [facility], [facility]

## 2021-05-02 DIAGNOSIS — G35 Multiple sclerosis: Secondary | ICD-10-CM | POA: Diagnosis not present

## 2021-05-03 DIAGNOSIS — G35 Multiple sclerosis: Secondary | ICD-10-CM | POA: Diagnosis not present

## 2021-05-04 DIAGNOSIS — G35 Multiple sclerosis: Secondary | ICD-10-CM | POA: Diagnosis not present

## 2021-05-05 DIAGNOSIS — G35 Multiple sclerosis: Secondary | ICD-10-CM | POA: Diagnosis not present

## 2021-05-06 DIAGNOSIS — G35 Multiple sclerosis: Secondary | ICD-10-CM | POA: Diagnosis not present

## 2021-05-06 MED FILL — PLEGRIDY 125 MCG/0.5 ML SUBCUTANEOUS PEN INJECTOR: SUBCUTANEOUS | 28 days supply | Qty: 1 | Fill #4

## 2021-05-07 DIAGNOSIS — G35 Multiple sclerosis: Secondary | ICD-10-CM | POA: Diagnosis not present

## 2021-05-08 DIAGNOSIS — G35 Multiple sclerosis: Secondary | ICD-10-CM | POA: Diagnosis not present

## 2021-05-09 DIAGNOSIS — G35 Multiple sclerosis: Secondary | ICD-10-CM | POA: Diagnosis not present

## 2021-05-10 DIAGNOSIS — G35 Multiple sclerosis: Secondary | ICD-10-CM | POA: Diagnosis not present

## 2021-05-11 DIAGNOSIS — G35 Multiple sclerosis: Secondary | ICD-10-CM | POA: Diagnosis not present

## 2021-05-12 DIAGNOSIS — G35 Multiple sclerosis: Secondary | ICD-10-CM | POA: Diagnosis not present

## 2021-05-13 DIAGNOSIS — G35 Multiple sclerosis: Secondary | ICD-10-CM | POA: Diagnosis not present

## 2021-05-14 DIAGNOSIS — G35 Multiple sclerosis: Secondary | ICD-10-CM | POA: Diagnosis not present

## 2021-05-15 DIAGNOSIS — G35 Multiple sclerosis: Secondary | ICD-10-CM | POA: Diagnosis not present

## 2021-05-16 DIAGNOSIS — G35 Multiple sclerosis: Secondary | ICD-10-CM | POA: Diagnosis not present

## 2021-05-17 DIAGNOSIS — G35 Multiple sclerosis: Secondary | ICD-10-CM | POA: Diagnosis not present

## 2021-05-18 DIAGNOSIS — G35 Multiple sclerosis: Secondary | ICD-10-CM | POA: Diagnosis not present

## 2021-05-19 DIAGNOSIS — G35 Multiple sclerosis: Secondary | ICD-10-CM | POA: Diagnosis not present

## 2021-05-20 DIAGNOSIS — G35 Multiple sclerosis: Secondary | ICD-10-CM | POA: Diagnosis not present

## 2021-05-21 DIAGNOSIS — G35 Multiple sclerosis: Secondary | ICD-10-CM | POA: Diagnosis not present

## 2021-05-21 MED ORDER — PLEGRIDY 125 MCG/0.5 ML SUBCUTANEOUS PEN INJECTOR
SUBCUTANEOUS | 4 refills | 28 days
Start: 2021-05-21 — End: ?

## 2021-05-22 DIAGNOSIS — G35 Multiple sclerosis: Secondary | ICD-10-CM | POA: Diagnosis not present

## 2021-05-22 MED ORDER — PLEGRIDY 125 MCG/0.5 ML SUBCUTANEOUS PEN INJECTOR
SUBCUTANEOUS | 0 refills | 28.00000 days | Status: CP
Start: 2021-05-22 — End: ?

## 2021-05-23 DIAGNOSIS — G35 Multiple sclerosis: Secondary | ICD-10-CM | POA: Diagnosis not present

## 2021-05-24 DIAGNOSIS — G35 Multiple sclerosis: Secondary | ICD-10-CM | POA: Diagnosis not present

## 2021-05-25 DIAGNOSIS — G35 Multiple sclerosis: Secondary | ICD-10-CM | POA: Diagnosis not present

## 2021-05-26 ENCOUNTER — Ambulatory Visit: Payer: Self-pay | Admitting: *Deleted

## 2021-05-26 DIAGNOSIS — G35 Multiple sclerosis: Secondary | ICD-10-CM | POA: Diagnosis not present

## 2021-05-26 NOTE — Telephone Encounter (Signed)
Reason for Disposition . Abdominal pain is a chronic symptom (recurrent or ongoing AND present > 4 weeks)  Answer Assessment - Initial Assessment Questions 1. LOCATION: "Where does it hurt?"      Left side abdomen 'Bloating' 2. RADIATION: "Does the pain shoot anywhere else?" (e.g., chest, back)   no 3. ONSET: "When did the pain begin?" (e.g., minutes, hours or days ago)   Years, worse since Jan 4. SUDDEN: "Gradual or sudden onset?"     "MS HUG" 5. PATTERN "Does the pain come and go, or is it constant?"    - If constant: "Is it getting better, staying the same, or worsening?"      (Note: Constant means the pain never goes away completely; most serious pain is constant and it progresses)     - If intermittent: "How long does it last?" "Do you have pain now?"     (Note: Intermittent means the pain goes away completely between bouts)     *No Answer* 6. SEVERITY: "How bad is the pain?"  (e.g., Scale 1-10; mild, moderate, or severe)   - MILD (1-3): doesn't interfere with normal activities, abdomen soft and not tender to touch    - MODERATE (4-7): interferes with normal activities or awakens from sleep, abdomen tender to touch    - SEVERE (8-10): excruciating pain, doubled over, unable to do any normal activities      *No Answer* 7. RECURRENT SYMPTOM: "Have you ever had this type of stomach pain before?" If Yes, ask: "When was the last time?" and "What happened that time?"      *No Answer* 8. CAUSE: "What do you think is causing the stomach pain?"     *No Answer* 9. RELIEVING/AGGRAVATING FACTORS: "What makes it better or worse?" (e.g., movement, antacids, bowel movement)     *No Answer* 10. OTHER SYMPTOMS: "Do you have any other symptoms?" (e.g., back pain, diarrhea, fever, urination pain, vomiting)       NO  Protocols used: Abdominal Pain - Female-A-AH

## 2021-05-26 NOTE — Telephone Encounter (Signed)
Tried calling patient back to inform her that per Dr. Ancil Boozer if she feels as though she needs an MRI it would have to be ordered by her neurologist since he is managing the MS.  Since she has been dealing the bloating feeling since Jan. She can wait until her follow on 06/11/21 with Dr. Ancil Boozer.  Was unable to leave a voice message due to mailbox not being set up.

## 2021-05-26 NOTE — Telephone Encounter (Signed)
Pt with H/O MS. States worsening "MS Hug" left side of abdomen. States "Not painful, jsust a swelling discomfort, bloated." States has had for "Long time" worsening since January. States worse after eating and/or drinking. States if has BM, discomfort subsides. Reports takes Linzess occasionally but has to stop if stools too loose. States takes approximately every 3 days. Reports has consulted with neurologist regarding this and had colonoscopy in March. Questioning if she needs MRI. Pt has appt 06/11/21. Also questioning if she should be seen sooner. Pt states she has appt at [facility] for blood work in AM.  Assured pt NT would route to practice for PCPs review and final disposition. Pt verbalizes understanding.  Please advsie: [phone]

## 2021-05-27 DIAGNOSIS — G35 Multiple sclerosis: Secondary | ICD-10-CM | POA: Diagnosis not present

## 2021-05-28 ENCOUNTER — Ambulatory Visit: Admit: 2021-05-28 | Discharge: 2021-05-29 | Payer: MEDICARE

## 2021-05-28 DIAGNOSIS — G35 Multiple sclerosis: Secondary | ICD-10-CM | POA: Diagnosis not present

## 2021-05-28 MED ORDER — PLEGRIDY 125 MCG/0.5 ML SUBCUTANEOUS PEN INJECTOR
SUBCUTANEOUS | 4 refills | 28.00000 days | Status: CP
Start: 2021-05-28 — End: ?
  Filled 2021-05-28: qty 1, 28d supply, fill #0

## 2021-05-29 DIAGNOSIS — G35 Multiple sclerosis: Secondary | ICD-10-CM | POA: Diagnosis not present

## 2021-05-30 DIAGNOSIS — G35 Multiple sclerosis: Secondary | ICD-10-CM | POA: Diagnosis not present

## 2021-05-31 DIAGNOSIS — G35 Multiple sclerosis: Secondary | ICD-10-CM | POA: Diagnosis not present

## 2021-06-01 DIAGNOSIS — G35 Multiple sclerosis: Secondary | ICD-10-CM | POA: Diagnosis not present

## 2021-06-02 DIAGNOSIS — G35 Multiple sclerosis: Secondary | ICD-10-CM | POA: Diagnosis not present

## 2021-06-03 DIAGNOSIS — G35 Multiple sclerosis: Secondary | ICD-10-CM | POA: Diagnosis not present

## 2021-06-04 ENCOUNTER — Ambulatory Visit
Admission: EM | Admit: 2021-06-04 | Discharge: 2021-06-04 | Disposition: A | Discharge status: Home / Self Care | Payer: Medicare HMO | Attending: Emergency Medicine | Admitting: Emergency Medicine

## 2021-06-04 ENCOUNTER — Other Ambulatory Visit: Payer: Self-pay

## 2021-06-04 DIAGNOSIS — M461 Sacroiliitis, not elsewhere classified: Secondary | ICD-10-CM | POA: Diagnosis not present

## 2021-06-04 DIAGNOSIS — J0100 Acute maxillary sinusitis, unspecified: Secondary | ICD-10-CM

## 2021-06-04 DIAGNOSIS — G35 Multiple sclerosis: Secondary | ICD-10-CM | POA: Diagnosis not present

## 2021-06-04 MED ORDER — PREDNISONE 10 MG (21) PO TBPK: ORAL_TABLET | ORAL | 0 refills | Status: DC

## 2021-06-04 MED ORDER — DOXYCYCLINE HYCLATE 100 MG PO CAPS: 100.0000 mg | ORAL_CAPSULE | Freq: Two times a day (BID) | ORAL | 0 refills | Status: DC

## 2021-06-04 NOTE — Discharge Instructions (Addendum)
The Doxycycline twice daily with food for 10 days for treatment of your sinusitis.  Starting tomorrow morning begin the prednisone Dosepak.  You will take it each morning at breakfast time for 6 days.  This will help you with inflammation in your nasal passages as well as the inflammation in your sacroiliac ligament which is causing your back pain.  Perform sinus irrigation 2-3 times a day with a NeilMed sinus rinse kit and distilled water.  Do not use tap water.  You can use plain over-the-counter Mucinex every 6 hours to break up the stickiness of the mucus so your body can clear it.  Increase your oral fluid intake to thin out your mucus so that is also able for your body to clear more easily.  Take an over-the-counter probiotic, such as Culturelle-align-activia, 1 hour after each dose of antibiotic to prevent diarrhea.  If you develop any new or worsening symptoms return for reevaluation or see your primary care provider.

## 2021-06-04 NOTE — ED Triage Notes (Addendum)
Pt c/o sinus congestion and drainage for about 2 weeks. Pt states she has been taking OTC meds but they are not helping. Pt also c/o left-sided LBP for several months, has seen neuro and GI and they did not investigate. Pt states she does have CPE scheduled for next week but did not want to wait.

## 2021-06-04 NOTE — ED Provider Notes (Signed)
[facility]    CSN: [id number] Arrival date & time: 06/04/21  0938      History   Chief Complaint Chief Complaint  Patient presents with   Nasal Congestion   Back Pain    HPI Tammie Lopez is a 50 y.o. female.   HPI  50 year old female here for evaluation of sinus symptoms and.  Patient reports that she has been experiencing sinus congestion with postnasal drip for the past 2 weeks.  She is not getting any nasal discharge from her nose.  She does have associated symptoms of ear pain and nausea.  She has not had a fever, sore throat, cough, vomiting, or diarrhea.  The low back pain is on the left side has been on for the past several months.  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 07/16/2020   Low TSH level 09/19/2019   Obesity (BMI 30-39.9) 12/06/2017   Insulin resistance 09/27/2017   Essential hypertension 09/21/2017   Right sided sciatica 09/21/2017   Major depression, recurrent ([place]) 05/24/2017   Migraine without aura, not intractable 12/09/2016   Nickel allergy 12/09/2016   GERD without esophagitis 12/09/2016   Other neutropenia ([place]) 12/09/2016   Dyslipidemia 12/09/2016   Diverticulosis 12/09/2016   Proctitis 12/09/2016   Allergic rhinitis, seasonal 12/09/2016   Chronic constipation 12/09/2016   Vitamin D deficiency 08/06/2013   MS (multiple sclerosis) ([place]) 04/17/2013    Past Surgical History:  Procedure Laterality Date   BREAST BIOPSY Left 01/06/2018    2 areas  neg   BUNIONECTOMY      CESAREAN SECTION N/A 2006   COLONOSCOPY WITH PROPOFOL N/A 02/09/2021   Procedure: COLONOSCOPY WITH PROPOFOL;  Surgeon: [name], MD;  Location: [place];  Service: Endoscopy;  Laterality: N/A;   GANGLION CYST EXCISION Bilateral    Hands   POLYPECTOMY N/A 02/09/2021   Procedure: POLYPECTOMY;  Surgeon: [name], MD;  Location: [place];  Service: Endoscopy;  Laterality: N/A;    OB History   No obstetric history on file.      Home Medications    Prior to Admission medications   Medication Sig Start Date End Date Taking? Authorizing Provider  amitriptyline (ELAVIL) 25 MG tablet Take 1 tablet (25 mg total) by mouth at bedtime. 02/04/21  Yes Sowles, Drue Stager, MD  amLODipine (NORVASC) 2.5 MG tablet TAKE 1 TABLET BY MOUTH EVERY DAY 04/17/21  Yes Sowles, Drue Stager, MD  Azelastine HCl 137 MCG/SPRAY SOLN PLACE 1 SPRAY INTO BOTH NOSTRILS 2 (TWO) TIMES DAILY. USE IN EACH NOSTRIL AS DIRECTED 05/01/21  Yes Sowles, Drue Stager, MD  Cholecalciferol (VITAMIN D) 50 MCG (2000 UT) CAPS Take 50,000 Units by mouth once a week.   Yes [provider]  cyclobenzaprine (FLEXERIL) 10 MG tablet Take 0.5-1 tablets (5-10 mg total) by mouth at bedtime as needed for muscle spasms. 10/15/20  Yes Sowles, Drue Stager, MD  doxycycline (VIBRAMYCIN) 100 MG capsule Take 1  capsule (100 mg total) by mouth 2 (two) times daily. 06/04/21  Yes Margarette Canada, NP  LINZESS 290 MCG CAPS capsule TAKE 1 CAPSULE (290 MCG TOTAL) BY MOUTH DAILY. 04/04/21  Yes Sowles, Drue Stager, MD  loratadine (CLARITIN) 10 MG tablet TAKE 1 TABLET BY MOUTH EVERY DAY 04/16/21  Yes Sowles, Drue Stager, MD  Na Sulfate-K Sulfate-Mg Sulf (SUPREP BOWEL PREP KIT) 17.5-3.13-1.6 GM/177ML SOLN Take 1 kit by mouth as directed. 01/29/21  Yes [name], MD  omeprazole (PRILOSEC) 40 MG capsule TAKE 1 CAPSULE BY MOUTH EVERY DAY 03/02/20  Yes Sowles, Drue Stager, MD  Peginterferon Beta-1a (PLEGRIDY) 125 MCG/0.5ML SOPN Inject into the skin. Inject the contents of 1 pen  (125 mcg) under the skin every fourteen (14) days. 11/25/20  Yes [provider]  predniSONE (STERAPRED UNI-PAK 21 TAB) 10 MG (21) TBPK tablet Take 6 tablets on day 1, 5 tablets day 2, 4 tablets day 3, 3 tablets day 4, 2 tablets day 5, 1 tablet day 6 06/04/21  Yes Margarette Canada, NP  SUMAtriptan (IMITREX) 100 MG tablet Take 1 tablet (100 mg total) by mouth every 2 (two) hours as needed for migraine. May repeat in 2 hours if headache persists or recurs. 10/15/20  Yes Sowles, Drue Stager, MD  triamcinolone (KENALOG) 0.1 % Apply 1 application topically 2 (two) times daily. 01/20/21  Yes Steele Sizer, MD    Family History Family History  Problem Relation Age of Onset   Diabetes Mother    Hypertension Mother    Cancer Father 73       Mouth   COPD Father    Hypertension Brother    Diabetes Brother    Breast cancer Neg Hx     Social History Social History   Tobacco Use   Smoking status: Never   Smokeless tobacco: Never   Tobacco comments:    smoking cessation materials not required  Vaping Use   Vaping Use: Never used  Substance Use Topics   Alcohol use: No   Drug use: No     Allergies   Penicillins   Review of Systems Review of Systems  Constitutional:  Negative for fever.  HENT:  Positive for congestion, postnasal drip, sinus pressure and sinus pain. Negative for rhinorrhea and sore throat.   Respiratory:  Negative for cough.   Gastrointestinal:  Positive for nausea. Negative for diarrhea and vomiting.  Musculoskeletal:  Positive for back pain.    Physical Exam Triage Vital Signs ED Triage Vitals  Enc Vitals Group     BP 06/04/21 1016 (!) 167/93     Pulse Rate 06/04/21 1016 66     Resp 06/04/21 1016 18     Temp 06/04/21 1016 98.5 F (36.9 C)     Temp Source 06/04/21 1016 Oral     SpO2 06/04/21 1016 100 %     Weight 06/04/21 1013 160 lb (72.6 kg)     Height 06/04/21 1013 _0  (1.575 m)     Head Circumference --      Peak Flow --      Pain Score 06/04/21  1012 8     Pain Loc --      Pain Edu? --      Excl. in [place]? --    No data found.  Updated Vital Signs BP (!) 167/93 (BP Location: Left Arm)   Pulse 66   Temp 98.5 F (36.9 C) (Oral)   Resp 18   Ht _1  (1.575 m)   Wt 160 lb (  72.6 kg)   LMP 05/22/2021   SpO2 100%   BMI 29.26 kg/m   Visual Acuity Right Eye Distance:   Left Eye Distance:   Bilateral Distance:    Right Eye Near:   Left Eye Near:    Bilateral Near:     Physical Exam Vitals and nursing note reviewed.  Constitutional:      General: She is not in acute distress.    Appearance: Normal appearance. She is not ill-appearing.  HENT:     Head: Normocephalic and atraumatic.     Right Ear: Tympanic membrane, ear canal and external ear normal. There is no impacted cerumen.     Left Ear: Tympanic membrane, ear canal and external ear normal.     Nose: Congestion and rhinorrhea present.     Mouth/Throat:     Mouth: Mucous membranes are moist.     Pharynx: No posterior oropharyngeal erythema.  Cardiovascular:     Rate and Rhythm: Normal rate and regular rhythm.     Pulses: Normal pulses.     Heart sounds: Normal heart sounds. No murmur heard.   No gallop.  Pulmonary:     Effort: Pulmonary effort is normal.     Breath sounds: Normal breath sounds. No wheezing, rhonchi or rales.  Musculoskeletal:        General: Tenderness present.     Cervical back: Normal range of motion and neck supple.  Lymphadenopathy:     Cervical: No cervical adenopathy.  Skin:    General: Skin is warm and dry.     Capillary Refill: Capillary refill takes less than 2 seconds.  Neurological:     General: No focal deficit present.     Mental Status: She is alert and oriented to person, place, and time.  Psychiatric:        Mood and Affect: Mood normal.        Behavior: Behavior normal.        Thought Content: Thought content normal.        Judgment: Judgment normal.     UC Treatments / Results  Labs (all labs ordered are listed,  but only abnormal results are displayed) Labs Reviewed - No data to display  EKG   Radiology No results found.  Procedures Procedures (including critical care time)  Medications Ordered in UC Medications - No data to display  Initial Impression / Assessment and Plan / UC Course  I have reviewed the triage vital signs and the nursing notes.  Pertinent labs & imaging results that were available during my care of the patient were reviewed by me and considered in my medical decision making (see chart for details).  Patient is a very pleasant and nontoxic-appearing 50 year old female here for evaluation of respiratory complaints and low back pain as outlined in the HPI above.  Patient's physical exam reveals pearly-gray tympanic membrane's bilaterally with a normal light reflex and clear external auditory canals.  Nasal mucosa is markedly edematous and erythematous.  I am unable to visualize the turbinates due to the amount of edema.  Patient also has tenderness to percussion of bilateral maxillary sinuses.  Oropharyngeal exam reveals posterior oropharyngeal erythema and yellow postnasal drip.  No cervical lymphadenopathy appreciated exam.  Cardiopulmonary exam is benign.  Patient does have tenderness inflammation of her superior sacroiliac ligament but no paraspinous spasm or tenderness of the muscle group or of the gluteus maximus.  Suspect patient's back pain is a result of this inflamed ligament.  The remainder of  patient's exam is consistent with maxillary sinusitis and since she has a penicillin allergy will treat with doxycycline twice daily for 10 days, sinus irrigation, and a steroid Dosepak.  This will help decrease the inflammation in her nasal passages as well as help the pain from her inflamed sacroiliac ligament.  Patient educational handouts provided.   Final Clinical Impressions(s) / UC Diagnoses   Final diagnoses:  Acute non-recurrent maxillary sinusitis  Sacroiliac  inflammation (HCC)     Discharge Instructions      The Doxycycline twice daily with food for 10 days for treatment of your sinusitis.  Starting tomorrow morning begin the prednisone Dosepak.  You will take it each morning at breakfast time for 6 days.  This will help you with inflammation in your nasal passages as well as the inflammation in your sacroiliac ligament which is causing your back pain.  Perform sinus irrigation 2-3 times a day with a NeilMed sinus rinse kit and distilled water.  Do not use tap water.  You can use plain over-the-counter Mucinex every 6 hours to break up the stickiness of the mucus so your body can clear it.  Increase your oral fluid intake to thin out your mucus so that is also able for your body to clear more easily.  Take an over-the-counter probiotic, such as Culturelle-align-activia, 1 hour after each dose of antibiotic to prevent diarrhea.  If you develop any new or worsening symptoms return for reevaluation or see your primary care provider.      ED Prescriptions     Medication Sig Dispense Auth. Provider   doxycycline (VIBRAMYCIN) 100 MG capsule Take 1 capsule (100 mg total) by mouth 2 (two) times daily. 20 capsule Margarette Canada, NP   predniSONE (STERAPRED UNI-PAK 21 TAB) 10 MG (21) TBPK tablet Take 6 tablets on day 1, 5 tablets day 2, 4 tablets day 3, 3 tablets day 4, 2 tablets day 5, 1 tablet day 6 21 tablet Margarette Canada, NP      PDMP not reviewed this encounter.   Margarette Canada, NP 06/04/21 1036

## 2021-06-05 DIAGNOSIS — G35 Multiple sclerosis: Secondary | ICD-10-CM | POA: Diagnosis not present

## 2021-06-06 DIAGNOSIS — G35 Multiple sclerosis: Secondary | ICD-10-CM | POA: Diagnosis not present

## 2021-06-07 DIAGNOSIS — G35 Multiple sclerosis: Secondary | ICD-10-CM | POA: Diagnosis not present

## 2021-06-08 DIAGNOSIS — G35 Multiple sclerosis: Secondary | ICD-10-CM | POA: Diagnosis not present

## 2021-06-09 ENCOUNTER — Ambulatory Visit: Admit: 2021-06-09 | Discharge: 2021-06-09 | Payer: MEDICARE

## 2021-06-09 DIAGNOSIS — G35 Multiple sclerosis: Secondary | ICD-10-CM | POA: Diagnosis not present

## 2021-06-09 DIAGNOSIS — M47812 Spondylosis without myelopathy or radiculopathy, cervical region: Secondary | ICD-10-CM | POA: Diagnosis not present

## 2021-06-09 DIAGNOSIS — M2578 Osteophyte, vertebrae: Secondary | ICD-10-CM | POA: Diagnosis not present

## 2021-06-10 ENCOUNTER — Telehealth
Admit: 2021-06-10 | Discharge: 2021-06-11 | Payer: MEDICARE | Attending: Physician Assistant | Primary: Physician Assistant

## 2021-06-10 DIAGNOSIS — G35 Multiple sclerosis: Secondary | ICD-10-CM | POA: Diagnosis not present

## 2021-06-10 DIAGNOSIS — E559 Vitamin D deficiency, unspecified: Secondary | ICD-10-CM | POA: Diagnosis not present

## 2021-06-10 MED ORDER — AMANTADINE HCL 100 MG TABLET
ORAL_TABLET | 5 refills | 0 days | Status: CP
Start: 2021-06-10 — End: ?

## 2021-06-10 NOTE — Patient Instructions (Signed)
Preventive Care 49-49 Years Old, Female Preventive care refers to lifestyle choices and visits with your health care provider that can promote health and wellness. This includes: A yearly physical exam. This is also called an annual wellness visit. Regular dental and eye exams. Immunizations. Screening for certain conditions. Healthy lifestyle choices, such as: Eating a healthy diet. Getting regular exercise. Not using drugs or products that contain nicotine and tobacco. Limiting alcohol use. What can I expect for my preventive care visit? Physical exam Your health care provider will check your: Height and weight. These may be used to calculate your BMI (body mass index). BMI is a measurement that tells if you are at a healthy weight. Heart rate and blood pressure. Body temperature. Skin for abnormal spots. Counseling Your health care provider may ask you questions about your: Past medical problems. Family's medical history. Alcohol, tobacco, and drug use. Emotional well-being. Home life and relationship well-being. Sexual activity. Diet, exercise, and sleep habits. Work and work Statistician. Access to firearms. Method of birth control. Menstrual cycle. Pregnancy history. What immunizations do I need?  Vaccines are usually given at various ages, according to a schedule. Your health care provider will recommend vaccines for you based on your age, medicalhistory, and lifestyle or other factors, such as travel or where you work. What tests do I need? Blood tests Lipid and cholesterol levels. These may be checked every 5 years, or more often if you are over 49 years old. Hepatitis C test. Hepatitis B test. Screening Lung cancer screening. You may have this screening every year starting at age 30 if you have a 30-pack-year history of smoking and currently smoke or have quit within the past 15 years. Colorectal cancer screening. All adults should have this screening starting at  age 49 and continuing until age 49. Your health care provider may recommend screening at age 49 if you are at increased risk. You will have tests every 1-10 years, depending on your results and the type of screening test. Diabetes screening. This is done by checking your blood sugar (glucose) after you have not eaten for a while (fasting). You may have this done every 1-3 years. Mammogram. This may be done every 1-2 years. Talk with your health care provider about when you should start having regular mammograms. This may depend on whether you have a family history of breast cancer. BRCA-related cancer screening. This may be done if you have a family history of breast, ovarian, tubal, or peritoneal cancers. Pelvic exam and Pap test. This may be done every 3 years starting at age 49. Starting at age 49, this may be done every 5 years if you have a Pap test in combination with an HPV test. Other tests STD (sexually transmitted disease) testing, if you are at risk. Bone density scan. This is done to screen for osteoporosis. You may have this scan if you are at high risk for osteoporosis. Talk with your health care provider about your test results, treatment options,and if necessary, the need for more tests. Follow these instructions at home: Eating and drinking  Eat a diet that includes fresh fruits and vegetables, whole grains, lean protein, and low-fat dairy products. Take vitamin and mineral supplements as recommended by your health care provider. Do not drink alcohol if: Your health care provider tells you not to drink. You are pregnant, may be pregnant, or are planning to become pregnant. If you drink alcohol: Limit how much you have to 0-1 drink a day. Be aware  of how much alcohol is in your drink. In the [place] one drink equals one 12 oz bottle of beer (355 mL), one 5 oz glass of wine (148 mL), or one 1 oz glass of hard liquor (44 mL).  Lifestyle Take daily care of your teeth and  gums. Brush your teeth every morning and night with fluoride toothpaste. Floss one time each day. Stay active. Exercise for at least 30 minutes 5 or more days each week. Do not use any products that contain nicotine or tobacco, such as cigarettes, e-cigarettes, and chewing tobacco. If you need help quitting, ask your health care provider. Do not use drugs. If you are sexually active, practice safe sex. Use a condom or other form of protection to prevent STIs (sexually transmitted infections). If you do not wish to become pregnant, use a form of birth control. If you plan to become pregnant, see your health care provider for a prepregnancy visit. If told by your health care provider, take low-dose aspirin daily starting at age 49. Find healthy ways to cope with stress, such as: Meditation, yoga, or listening to music. Journaling. Talking to a trusted person. Spending time with friends and family. Safety Always wear your seat belt while driving or riding in a vehicle. Do not drive: If you have been drinking alcohol. Do not ride with someone who has been drinking. When you are tired or distracted. While texting. Wear a helmet and other protective equipment during sports activities. If you have firearms in your house, make sure you follow all gun safety procedures. What's next? Visit your health care provider once a year for an annual wellness visit. Ask your health care provider how often you should have your eyes and teeth checked. Stay up to date on all vaccines. This information is not intended to replace advice given to you by your health care provider. Make sure you discuss any questions you have with your healthcare provider. Document Revised: 08/19/2020 Document Reviewed: 07/27/2018 Elsevier Patient Education  2022 [employer].

## 2021-06-10 NOTE — Progress Notes (Signed)
Name: Tammie Lopez   MRN: [id number]    DOB: Jul 28, 1971   Date:06/11/2021       Progress Note  Subjective  Chief Complaint  Annual Exam  HPI  Patient presents for annual CPE and follow up, discussed extra cost since it will be a split visit, patient agreed  HTN: bp spikes when on prednisone, today it is at goal, she is on low dose norvasc. Intermittently, explained importance of taking it daily, no chest pain or palpitation    MS: she has been off Tacfidera due to leucopenia, she goes to [facility] neurology, she is taking Plegridy, not side effects now, no longer having headaches . She has lesions on thoracic spine, it may be the cause of LUQ pain but we will refer her to GI for further evaluation   Low back pain: going on for past couple of years, improves with meloxicam but not with flexeril, pain is aching like and constant, can radiate to pelvic area but not her legs.   Pelvic cramping and heavy bleeding: she has noticed lower abdominal pain for the past few months, irregular cycles and also heavy cycles with clumps of clots. LMP June 24 th, 2022.    Obesity: history of insulin resistance, took Trulicity in the past and also Metformin, she states she has been walking, cutting down on sodas but unable to lose weight. Weight is down 2 lbs since Feb  Eczema: doing better now   Chronic constipation/GERD: she is always feeling bloated, takes Linzess occasionally, had colonoscopy done by Dr. Allen Norris She has been having early satiety and LUQ pain going on for months, usually worse with movement    AR: she has recurrent episodes of nasal congestion and facial pressure, she recently went to Urgent care and diagnosed with sinusitis she was given prednisone and doxy but could not tolerate, continues to have maxillary pressure and a lot of post-nasal drainage that causes her to feel nauseated. Advised her to resume claritin and Astelin, we will try 3 days of azithromycin 500 mg   Diet: trying to eat  healthier  Exercise: continue regular physical activity    [place] Office Visit from 01/20/2021 in [facility]  AUDIT-C Score 0      Depression: Phq 9 is  negative Depression screen [facility] 2/9 06/11/2021 01/20/2021 12/18/2020 10/15/2020 04/21/2020  Decreased Interest 0 0 0 0 0  Down, Depressed, Hopeless 0 0 0 0 0  PHQ - 2 Score 0 0 0 0 0  Altered sleeping 1 - - - 0  Tired, decreased energy 1 - - - 0  Change in appetite 3 - - - 0  Feeling bad or failure about yourself  0 - - - 0  Trouble concentrating 0 - - - 0  Moving slowly or fidgety/restless 0 - - - 0  Suicidal thoughts 0 - - - 0  PHQ-9 Score 5 - - - 0  Difficult doing work/chores - - - - -  Some recent data might be hidden   Hypertension: BP Readings from Last 3 Encounters:  06/11/21 136/84  06/04/21 (!) 167/93  02/09/21 (!) 135/95   Obesity: Wt Readings from Last 3 Encounters:  06/11/21 162 lb (73.5 kg)  06/04/21 160 lb (72.6 kg)  02/09/21 160 lb (72.6 kg)   BMI Readings from Last 3 Encounters:  06/11/21 29.63 kg/m  06/04/21 29.26 kg/m  02/09/21 29.26 kg/m     Vaccines:   Shingrix: start at age 60  Pneumonia:  educated and discussed with patient.Explained importance of having it done, but she refused  Flu: educated and discussed with patient.  Hep C Screening: 11/24/18 STD testing and prevention (HIV/chl/gon/syphilis): 05/24/17 Intimate partner violence:negative Sexual History : same partner, no pain, states pelvic pain not related to intercourse, seems to be coming from lower back  Menstrual History/LMP/Abnormal Bleeding: cycles are not regular and has hot flashes   Incontinence Symptoms: no problems   Breast cancer:  - Last Mammogram: 01/22/21 - BRCA gene screening: N/A  Osteoporosis: Discussed high calcium and vitamin D supplementation, weight bearing exercises  Cervical cancer screening: today   Skin cancer: Discussed monitoring for atypical lesions  Colorectal cancer: 02/09/21    Lung cancer:  Low Dose CT Chest recommended if Age 41-80 years, 20 pack-year currently smoking OR have quit w/in 15years. Patient does not qualify.   ECG: 08/26/20  Advanced Care Planning: A voluntary discussion about advance care planning including the explanation and discussion of advance directives.  Discussed health care proxy and Living will, and the patient was able to identify a health care proxy as daughter   Lipids: Lab Results  Component Value Date   CHOL 175 10/15/2020   CHOL 175 04/21/2020   CHOL 169 09/18/2019   Lab Results  Component Value Date   HDL 54 10/15/2020   HDL 57 04/21/2020   HDL 54 09/18/2019   Lab Results  Component Value Date   LDLCALC 107 (H) 10/15/2020   LDLCALC 99 04/21/2020   LDLCALC 97 09/18/2019   Lab Results  Component Value Date   TRIG 56 10/15/2020   TRIG 96 04/21/2020   TRIG 88 09/18/2019   Lab Results  Component Value Date   CHOLHDL 3.2 10/15/2020   CHOLHDL 3.1 04/21/2020   CHOLHDL 3.1 09/18/2019   No results found for: LDLDIRECT  Glucose: Glucose, Bld  Date Value Ref Range Status  10/15/2020 88 65 - 99 mg/dL Final    Comment:    .            Fasting reference interval .   04/21/2020 88 65 - 99 mg/dL Final    Comment:    .            Fasting reference interval .   09/18/2019 91 65 - 99 mg/dL Final    Comment:    .            Fasting reference interval .     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 07/16/2020   Low TSH level 09/19/2019   Obesity (BMI 30-39.9) 12/06/2017   Insulin resistance 09/27/2017   Essential hypertension 09/21/2017   Right sided sciatica 09/21/2017   Major depression, recurrent ([place]) 05/24/2017   Migraine without aura, not intractable 12/09/2016   Nickel allergy 12/09/2016   GERD without esophagitis  12/09/2016   Other neutropenia ([place]) 12/09/2016   Dyslipidemia 12/09/2016   Diverticulosis 12/09/2016   Proctitis 12/09/2016   Allergic rhinitis, seasonal 12/09/2016   Chronic constipation 12/09/2016   Vitamin D deficiency 08/06/2013   MS (multiple sclerosis) ([place]) 04/17/2013    Past Surgical History:  Procedure Laterality Date   BREAST BIOPSY Left 01/06/2018    2 areas  neg   BUNIONECTOMY     CESAREAN SECTION N/A 2006   COLONOSCOPY WITH PROPOFOL N/A 02/09/2021  Procedure: COLONOSCOPY WITH PROPOFOL;  Surgeon: [name], MD;  Location: [place];  Service: Endoscopy;  Laterality: N/A;   GANGLION CYST EXCISION Bilateral    Hands   POLYPECTOMY N/A 02/09/2021   Procedure: POLYPECTOMY;  Surgeon: [name], MD;  Location: [place];  Service: Endoscopy;  Laterality: N/A;    Family History  Problem Relation Age of Onset   Diabetes Mother    Hypertension Mother    Cancer Father 71       Mouth   COPD Father    Hypertension Brother    Diabetes Brother    Breast cancer Neg Hx     Social History   Socioeconomic History   Marital status: Single    Spouse name: Not on file   Number of children: 3   Years of education: Not on file   Highest education level: High school graduate  Occupational History    Comment: bus driver  Tobacco Use   Smoking status: Never   Smokeless tobacco: Never   Tobacco comments:    smoking cessation materials not required  Vaping Use   Vaping Use: Never used  Substance and Sexual Activity   Alcohol use: No   Drug use: No   Sexual activity: Not Currently    Partners: Male  Other Topics Concern   Not on file  Social History Narrative   She is on disability for MS, able to work 20 hours per week, 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   Not currently working due to [employer]    Social Determinants of Radio broadcast assistant Strain: Low Risk    Difficulty of Paying Living Expenses: Not hard at all  Food  Insecurity: No Food Insecurity   Worried About Charity fundraiser in the Last Year: Never true   Arboriculturist in the Last Year: Never true  Transportation Needs: No Transportation Needs   Lack of Transportation (Medical): No   Lack of Transportation (Non-Medical): No  Physical Activity: Insufficiently Active   Days of Exercise per Week: 3 days   Minutes of Exercise per Session: 30 min  Stress: No Stress Concern Present   Feeling of Stress : Only a little  Social Connections: Moderately Isolated   Frequency of Communication with Friends and Family: More than three times a week   Frequency of Social Gatherings with Friends and Family: More than three times a week   Attends Religious Services: More than 4 times per year   Active Member of [employer] or Organizations: No   Attends Archivist Meetings: Never   Marital Status: Never married  Human resources officer Violence: Not At Risk   Fear of Current or Ex-Partner: No   Emotionally Abused: No   Physically Abused: No   Sexually Abused: No     Current Outpatient Medications:    azithromycin (ZITHROMAX) 500 MG tablet, Take 1 tablet (500 mg total) by mouth daily., Disp: 3 tablet, Rfl: 0   Cholecalciferol (VITAMIN D) 50 MCG (2000 UT) CAPS, Take 50,000 Units by mouth once a week., Disp: , Rfl:    LINZESS 290 MCG CAPS capsule, TAKE 1 CAPSULE (290 MCG TOTAL) BY MOUTH DAILY., Disp: 30 capsule, Rfl: 0   meloxicam (MOBIC) 15 MG tablet, Take 1 tablet (15 mg total) by mouth daily., Disp: 90 tablet, Rfl: 0   Peginterferon Beta-1a (PLEGRIDY) 125 MCG/0.5ML SOPN, Inject into the skin. Inject the contents of 1 pen (125 mcg) under the skin every  fourteen (14) days., Disp: , Rfl:    SUMAtriptan (IMITREX) 100 MG tablet, Take 1 tablet (100 mg total) by mouth every 2 (two) hours as needed for migraine. May repeat in 2 hours if headache persists or recurs., Disp: 9 tablet, Rfl: 1   triamcinolone (KENALOG) 0.1 %, Apply 1 application topically 2 (two) times  daily., Disp: 453.6 g, Rfl: 0   Amantadine HCl 100 MG tablet, Take 200 mg by mouth 2 (two) times daily., Disp: , Rfl:    amitriptyline (ELAVIL) 25 MG tablet, Take 1 tablet (25 mg total) by mouth at bedtime., Disp: 90 tablet, Rfl: 1   amLODipine (NORVASC) 2.5 MG tablet, Take 1 tablet (2.5 mg total) by mouth daily., Disp: 90 tablet, Rfl: 1   Azelastine HCl 137 MCG/SPRAY SOLN, PLACE 1 SPRAY INTO BOTH NOSTRILS 2 (TWO) TIMES DAILY. USE IN EACH NOSTRIL AS DIRECTED, Disp: 90 mL, Rfl: 1   loratadine (CLARITIN) 10 MG tablet, Take 1 tablet (10 mg total) by mouth daily., Disp: 90 tablet, Rfl: 0   omeprazole (PRILOSEC) 40 MG capsule, TAKE 1 CAPSULE BY MOUTH EVERY DAY, Disp: 90 capsule, Rfl: 1  Allergies  Allergen Reactions   Penicillins Hives     ROS  Constitutional: Negative for fever or weight change.  Respiratory: Negative for cough and shortness of breath.   Cardiovascular: Negative for chest pain or palpitations.  Gastrointestinal: Negative for abdominal pain, no bowel changes.  Musculoskeletal: Negative for gait problem or joint swelling.  Skin: Negative for rash.  Neurological: Negative for dizziness or headache.  No other specific complaints in a complete review of systems (except as listed in HPI above).   Objective  Vitals:   06/11/21 0918  BP: 136/84  Pulse: 92  Resp: 16  Temp: 98.2 F (36.8 C)  TempSrc: Oral  SpO2: 95%  Weight: 162 lb (73.5 kg)  Height: '5\' 2"'  (1.575 m)    Body mass index is 29.63 kg/m.  Physical Exam  Constitutional: Patient appears well-developed and well-nourished. No distress.  HENT: Head: Normocephalic and atraumatic. Ears: B TMs ok, no erythema or effusion; Nose: Nose normal. Mouth/Throat: not done  Eyes: Conjunctivae and EOM are normal. Pupils are equal, round, and reactive to light. No scleral icterus.  Neck: Normal range of motion. Neck supple. No JVD present. No thyromegaly present.  Cardiovascular: Normal rate, regular rhythm and normal  heart sounds.  No murmur heard. No BLE edema. Pulmonary/Chest: Effort normal and breath sounds normal. No respiratory distress. Abdominal: Soft. Bowel sounds are normal, no distension. There is no tenderness. no masses Breast: no lumps or masses, no nipple discharge or rashes FEMALE GENITALIA:  External genitalia normal External urethra normal Vaginal vault normal without discharge or lesions Cervix normal without discharge or lesions Bimanual exam normal without masses, but has pain during palpation of left lower quadrant  RECTAL: not done  Musculoskeletal: Normal range of motion, no joint effusions. No gross deformities Neurological: he is alert and oriented to person, place, and time. No cranial nerve deficit. Coordination, balance, strength, speech and gait are normal.  Skin: Skin is warm and dry. No rash noted. No erythema.  Psychiatric: Patient has a normal mood and affect. behavior is normal. Judgment and thought content normal.   Fall Risk: Fall Risk  06/11/2021 01/20/2021 12/18/2020 10/15/2020 10/15/2020  Falls in the past year? 0 0 0 0 0  Number falls in past yr: 0 0 0 0 0  Injury with Fall? 0 0 0 0 0  Risk for  fall due to : - - No Fall Risks - -  Follow up - - Falls prevention discussed - -     Functional Status Survey: Is the patient deaf or have difficulty hearing?: No Does the patient have difficulty seeing, even when wearing glasses/contacts?: No Does the patient have difficulty concentrating, remembering, or making decisions?: No Does the patient have difficulty walking or climbing stairs?: Yes Does the patient have difficulty dressing or bathing?: No Does the patient have difficulty doing errands alone such as visiting a doctor's office or shopping?: No   Assessment & Plan   1. Well adult exam   2. Cervical cancer screening  - Cytology - PAP  3. Breast cancer screening by mammogram  Up to date  4. Need for pneumococcal vaccine  Refused   5. GERD  without esophagitis  - omeprazole (PRILOSEC) 40 MG capsule; TAKE 1 CAPSULE BY MOUTH EVERY DAY  Dispense: 90 capsule; Refill: 1  6. Hypertension, benign  - amLODipine (NORVASC) 2.5 MG tablet; Take 1 tablet (2.5 mg total) by mouth daily.  Dispense: 90 tablet; Refill: 1 - COMPLETE METABOLIC PANEL WITH GFR  7. Seasonal allergic rhinitis, unspecified trigger  - Azelastine HCl 137 MCG/SPRAY SOLN; PLACE 1 SPRAY INTO BOTH NOSTRILS 2 (TWO) TIMES DAILY. USE IN EACH NOSTRIL AS DIRECTED  Dispense: 90 mL; Refill: 1 - loratadine (CLARITIN) 10 MG tablet; Take 1 tablet (10 mg total) by mouth daily.  Dispense: 90 tablet; Refill: 0  8. Low TSH level   9. Dyslipidemia  - Lipid panel  10. MS (multiple sclerosis) ([place])   11. Other neutropenia ([place])   12. Vitamin D deficiency disease  - VITAMIN D 25 Hydroxy (Vit-D Deficiency, Fractures)  13. Subacute maxillary sinusitis  - azithromycin (ZITHROMAX) 500 MG tablet; Take 1 tablet (500 mg total) by mouth daily.  Dispense: 3 tablet; Refill: 0  14. Migraine without aura and without status migrainosus, not intractable   15. Insomnia due to medical condition  - amitriptyline (ELAVIL) 25 MG tablet; Take 1 tablet (25 mg total) by mouth at bedtime.  Dispense: 90 tablet; Refill: 1  16. Pelvic pain in female   13. Abdominal pain, left upper quadrant  - Ambulatory referral to Gastroenterology  18. Hyperglycemia  - Hemoglobin A1c  19. Early satiety  - Ambulatory referral to Gastroenterology  20. Post-nasal drainage  - Azelastine HCl 137 MCG/SPRAY SOLN; PLACE 1 SPRAY INTO BOTH NOSTRILS 2 (TWO) TIMES DAILY. USE IN EACH NOSTRIL AS DIRECTED  Dispense: 90 mL; Refill: 1 - loratadine (CLARITIN) 10 MG tablet; Take 1 tablet (10 mg total) by mouth daily.  Dispense: 90 tablet; Refill: 0  21. Chronic bilateral low back pain without sciatica  - meloxicam (MOBIC) 15 MG tablet; Take 1 tablet (15 mg total) by mouth daily.  Dispense: 90 tablet; Refill:  0  22. Menorrhagia with irregular cycle  - US PELVIC COMPLETE WITH TRANSVAGINAL; Future   -USPSTF grade A and B recommendations reviewed with patient; age-appropriate recommendations, preventive care, screening tests, etc discussed and encouraged; healthy living encouraged; see AVS for patient education given to patient -Discussed importance of 150 minutes of physical activity weekly, eat two servings of fish weekly, eat one serving of tree nuts ( cashews, pistachios, pecans, almonds.Marland Kitchen) every other day, eat 6 servings of fruit/vegetables daily and drink plenty of water and avoid sweet beverages.

## 2021-06-11 ENCOUNTER — Other Ambulatory Visit (HOSPITAL_COMMUNITY)
Admission: RE | Admit: 2021-06-11 | Discharge: 2021-06-11 | Disposition: A | Discharge status: Home / Self Care | Payer: Medicare HMO | Source: Ambulatory Visit | Attending: Family Medicine | Admitting: Family Medicine

## 2021-06-11 ENCOUNTER — Ambulatory Visit (INDEPENDENT_AMBULATORY_CARE_PROVIDER_SITE_OTHER): Payer: Medicare HMO | Admitting: Family Medicine

## 2021-06-11 ENCOUNTER — Other Ambulatory Visit: Payer: Self-pay

## 2021-06-11 ENCOUNTER — Encounter: Payer: Self-pay | Admitting: Family Medicine

## 2021-06-11 VITALS — BP 136/84 | HR 92 | Temp 98.2°F | Resp 16 | Ht 62.0 in | Wt 162.0 lb

## 2021-06-11 DIAGNOSIS — Z1231 Encounter for screening mammogram for malignant neoplasm of breast: Secondary | ICD-10-CM

## 2021-06-11 DIAGNOSIS — Z124 Encounter for screening for malignant neoplasm of cervix: Secondary | ICD-10-CM

## 2021-06-11 DIAGNOSIS — I10 Essential (primary) hypertension: Secondary | ICD-10-CM

## 2021-06-11 DIAGNOSIS — Z01419 Encounter for gynecological examination (general) (routine) without abnormal findings: Secondary | ICD-10-CM | POA: Insufficient documentation

## 2021-06-11 DIAGNOSIS — R102 Pelvic and perineal pain: Secondary | ICD-10-CM

## 2021-06-11 DIAGNOSIS — R6881 Early satiety: Secondary | ICD-10-CM

## 2021-06-11 DIAGNOSIS — Z1151 Encounter for screening for human papillomavirus (HPV): Secondary | ICD-10-CM | POA: Diagnosis not present

## 2021-06-11 DIAGNOSIS — G43009 Migraine without aura, not intractable, without status migrainosus: Secondary | ICD-10-CM

## 2021-06-11 DIAGNOSIS — R1012 Left upper quadrant pain: Secondary | ICD-10-CM

## 2021-06-11 DIAGNOSIS — J302 Other seasonal allergic rhinitis: Secondary | ICD-10-CM

## 2021-06-11 DIAGNOSIS — M545 Low back pain, unspecified: Secondary | ICD-10-CM

## 2021-06-11 DIAGNOSIS — E559 Vitamin D deficiency, unspecified: Secondary | ICD-10-CM

## 2021-06-11 DIAGNOSIS — R7989 Other specified abnormal findings of blood chemistry: Secondary | ICD-10-CM

## 2021-06-11 DIAGNOSIS — N921 Excessive and frequent menstruation with irregular cycle: Secondary | ICD-10-CM

## 2021-06-11 DIAGNOSIS — R739 Hyperglycemia, unspecified: Secondary | ICD-10-CM | POA: Diagnosis not present

## 2021-06-11 DIAGNOSIS — K219 Gastro-esophageal reflux disease without esophagitis: Secondary | ICD-10-CM | POA: Diagnosis not present

## 2021-06-11 DIAGNOSIS — G35 Multiple sclerosis: Secondary | ICD-10-CM | POA: Diagnosis not present

## 2021-06-11 DIAGNOSIS — D708 Other neutropenia: Secondary | ICD-10-CM

## 2021-06-11 DIAGNOSIS — G8929 Other chronic pain: Secondary | ICD-10-CM

## 2021-06-11 DIAGNOSIS — R0982 Postnasal drip: Secondary | ICD-10-CM

## 2021-06-11 DIAGNOSIS — Z23 Encounter for immunization: Secondary | ICD-10-CM

## 2021-06-11 DIAGNOSIS — R69 Illness, unspecified: Secondary | ICD-10-CM | POA: Diagnosis not present

## 2021-06-11 DIAGNOSIS — G4701 Insomnia due to medical condition: Secondary | ICD-10-CM

## 2021-06-11 DIAGNOSIS — J0100 Acute maxillary sinusitis, unspecified: Secondary | ICD-10-CM

## 2021-06-11 DIAGNOSIS — Z0000 Encounter for general adult medical examination without abnormal findings: Secondary | ICD-10-CM

## 2021-06-11 DIAGNOSIS — E785 Hyperlipidemia, unspecified: Secondary | ICD-10-CM | POA: Diagnosis not present

## 2021-06-11 MED ORDER — AZELASTINE HCL 137 MCG/SPRAY NA SOLN: NASAL | 1 refills | Status: DC

## 2021-06-11 MED ORDER — AMLODIPINE BESYLATE 2.5 MG PO TABS: 2.5000 mg | ORAL_TABLET | Freq: Every day | ORAL | 1 refills | Status: DC

## 2021-06-11 MED ORDER — MELOXICAM 15 MG PO TABS: 15.0000 mg | ORAL_TABLET | Freq: Every day | ORAL | 0 refills | Status: DC

## 2021-06-11 MED ORDER — AZITHROMYCIN 500 MG PO TABS: 500.0000 mg | ORAL_TABLET | Freq: Every day | ORAL | 0 refills | Status: DC

## 2021-06-11 MED ORDER — AMITRIPTYLINE HCL 25 MG PO TABS: 25.0000 mg | ORAL_TABLET | Freq: Every day | ORAL | 1 refills | Status: DC

## 2021-06-11 MED ORDER — LORATADINE 10 MG PO TABS: 10.0000 mg | ORAL_TABLET | Freq: Every day | ORAL | 0 refills | Status: DC

## 2021-06-11 MED ORDER — OMEPRAZOLE 40 MG PO CPDR: DELAYED_RELEASE_CAPSULE | ORAL | 1 refills | Status: DC

## 2021-06-12 DIAGNOSIS — G35 Multiple sclerosis: Secondary | ICD-10-CM | POA: Diagnosis not present

## 2021-06-12 LAB — HEMOGLOBIN A1C
Hgb A1c MFr Bld: 5.6 % of total Hgb (ref <5.7)
Mean Plasma Glucose: 114 mg/dL
eAG (mmol/L): 6.3 mmol/L

## 2021-06-12 LAB — LIPID PANEL
Cholesterol: 161 mg/dL (ref <200)
HDL: 58 mg/dL (ref >=50)
LDL Cholesterol (Calc): 80 mg/dL (calc)
Non-HDL Cholesterol (Calc): 103 mg/dL (calc) (ref <130)
Total CHOL/HDL Ratio: 2.8 (calc) (ref <5.0)
Triglycerides: 125 mg/dL (ref <150)

## 2021-06-12 LAB — COMPLETE METABOLIC PANEL WITH GFR
AG Ratio: 1.5 (calc) (ref 1.0–2.5)
ALT: 11 U/L (ref 6–29)
AST: 15 U/L (ref 10–35)
Albumin: 4.2 g/dL (ref 3.6–5.1)
Alkaline phosphatase (APISO): 31 U/L (ref 31–125)
BUN: 12 mg/dL (ref 7–25)
CO2: 30 mmol/L (ref 20–32)
Calcium: 9.1 mg/dL (ref 8.6–10.2)
Chloride: 101 mmol/L (ref 98–110)
Creat: 0.77 mg/dL (ref 0.50–0.99)
Globulin: 2.8 g/dL (calc) (ref 1.9–3.7)
Glucose, Bld: 96 mg/dL (ref 65–99)
Potassium: 4 mmol/L (ref 3.5–5.3)
Sodium: 138 mmol/L (ref 135–146)
Total Bilirubin: 0.4 mg/dL (ref 0.2–1.2)
Total Protein: 7 g/dL (ref 6.1–8.1)
eGFR: 95 mL/min/{1.73_m2} (ref >=60)

## 2021-06-12 LAB — VITAMIN D 25 HYDROXY (VIT D DEFICIENCY, FRACTURES): Vit D, 25-Hydroxy: 44 ng/mL (ref 30–100)

## 2021-06-13 DIAGNOSIS — G35 Multiple sclerosis: Secondary | ICD-10-CM | POA: Diagnosis not present

## 2021-06-14 DIAGNOSIS — G35 Multiple sclerosis: Secondary | ICD-10-CM | POA: Diagnosis not present

## 2021-06-15 DIAGNOSIS — G35 Multiple sclerosis: Secondary | ICD-10-CM | POA: Diagnosis not present

## 2021-06-16 DIAGNOSIS — G35 Multiple sclerosis: Secondary | ICD-10-CM | POA: Diagnosis not present

## 2021-06-17 ENCOUNTER — Other Ambulatory Visit: Payer: Self-pay | Admitting: Family Medicine

## 2021-06-17 DIAGNOSIS — R87612 Low grade squamous intraepithelial lesion on cytologic smear of cervix (LGSIL): Secondary | ICD-10-CM

## 2021-06-17 DIAGNOSIS — G35 Multiple sclerosis: Secondary | ICD-10-CM | POA: Diagnosis not present

## 2021-06-17 LAB — CYTOLOGY - PAP
Comment: NEGATIVE
High risk HPV: POSITIVE — AB

## 2021-06-18 DIAGNOSIS — G35 Multiple sclerosis: Secondary | ICD-10-CM | POA: Diagnosis not present

## 2021-06-19 DIAGNOSIS — G35 Multiple sclerosis: Secondary | ICD-10-CM | POA: Diagnosis not present

## 2021-06-20 DIAGNOSIS — G35 Multiple sclerosis: Secondary | ICD-10-CM | POA: Diagnosis not present

## 2021-06-21 DIAGNOSIS — G35 Multiple sclerosis: Secondary | ICD-10-CM | POA: Diagnosis not present

## 2021-06-22 DIAGNOSIS — G35 Multiple sclerosis: Secondary | ICD-10-CM | POA: Diagnosis not present

## 2021-06-23 DIAGNOSIS — G35 Multiple sclerosis: Secondary | ICD-10-CM | POA: Diagnosis not present

## 2021-06-24 DIAGNOSIS — G35 Multiple sclerosis: Secondary | ICD-10-CM | POA: Diagnosis not present

## 2021-06-25 DIAGNOSIS — G35 Multiple sclerosis: Secondary | ICD-10-CM | POA: Diagnosis not present

## 2021-06-26 DIAGNOSIS — G35 Multiple sclerosis: Secondary | ICD-10-CM | POA: Diagnosis not present

## 2021-06-26 MED FILL — PLEGRIDY 125 MCG/0.5 ML SUBCUTANEOUS PEN INJECTOR: SUBCUTANEOUS | 28 days supply | Qty: 1 | Fill #1

## 2021-06-27 DIAGNOSIS — G35 Multiple sclerosis: Secondary | ICD-10-CM | POA: Diagnosis not present

## 2021-06-28 DIAGNOSIS — G35 Multiple sclerosis: Secondary | ICD-10-CM | POA: Diagnosis not present

## 2021-06-29 DIAGNOSIS — G35 Multiple sclerosis: Secondary | ICD-10-CM | POA: Diagnosis not present

## 2021-06-30 ENCOUNTER — Ambulatory Visit
Admission: RE | Admit: 2021-06-30 | Discharge: 2021-06-30 | Disposition: A | Discharge status: Home / Self Care | Payer: Medicare HMO | Source: Ambulatory Visit | Attending: Family Medicine | Admitting: Family Medicine

## 2021-06-30 ENCOUNTER — Other Ambulatory Visit: Payer: Self-pay

## 2021-06-30 DIAGNOSIS — N921 Excessive and frequent menstruation with irregular cycle: Secondary | ICD-10-CM | POA: Insufficient documentation

## 2021-06-30 DIAGNOSIS — G35 Multiple sclerosis: Secondary | ICD-10-CM | POA: Diagnosis not present

## 2021-07-01 DIAGNOSIS — G35 Multiple sclerosis: Secondary | ICD-10-CM | POA: Diagnosis not present

## 2021-07-02 DIAGNOSIS — G35 Multiple sclerosis: Secondary | ICD-10-CM | POA: Diagnosis not present

## 2021-07-03 DIAGNOSIS — G35 Multiple sclerosis: Secondary | ICD-10-CM | POA: Diagnosis not present

## 2021-07-04 DIAGNOSIS — G35 Multiple sclerosis: Secondary | ICD-10-CM | POA: Diagnosis not present

## 2021-07-05 DIAGNOSIS — G35 Multiple sclerosis: Secondary | ICD-10-CM | POA: Diagnosis not present

## 2021-07-06 DIAGNOSIS — G35 Multiple sclerosis: Secondary | ICD-10-CM | POA: Diagnosis not present

## 2021-07-07 ENCOUNTER — Ambulatory Visit (INDEPENDENT_AMBULATORY_CARE_PROVIDER_SITE_OTHER): Payer: Medicare HMO | Admitting: Obstetrics and Gynecology

## 2021-07-07 ENCOUNTER — Other Ambulatory Visit: Payer: Self-pay

## 2021-07-07 ENCOUNTER — Other Ambulatory Visit (HOSPITAL_COMMUNITY)
Admission: RE | Admit: 2021-07-07 | Discharge: 2021-07-07 | Disposition: A | Discharge status: Home / Self Care | Payer: Medicare HMO | Source: Ambulatory Visit | Attending: Obstetrics and Gynecology | Admitting: Obstetrics and Gynecology

## 2021-07-07 ENCOUNTER — Encounter: Payer: Self-pay | Admitting: Obstetrics and Gynecology

## 2021-07-07 VITALS — BP 158/110 | Ht 62.0 in | Wt 161.0 lb

## 2021-07-07 DIAGNOSIS — R87810 Cervical high risk human papillomavirus (HPV) DNA test positive: Secondary | ICD-10-CM | POA: Insufficient documentation

## 2021-07-07 DIAGNOSIS — N870 Mild cervical dysplasia: Secondary | ICD-10-CM | POA: Diagnosis not present

## 2021-07-07 DIAGNOSIS — R87612 Low grade squamous intraepithelial lesion on cytologic smear of cervix (LGSIL): Secondary | ICD-10-CM | POA: Insufficient documentation

## 2021-07-07 DIAGNOSIS — G35 Multiple sclerosis: Secondary | ICD-10-CM | POA: Diagnosis not present

## 2021-07-07 DIAGNOSIS — R69 Illness, unspecified: Secondary | ICD-10-CM | POA: Diagnosis not present

## 2021-07-07 NOTE — Progress Notes (Signed)
Referring Provider:  Steele Sizer, MD  HPI:  Tammie Lopez is a 50 y.o.  No obstetric history on file.  who presents today for evaluation and management of abnormal cervical cytology.    Dysplasia History:  2022: LGSIL pap smear with HPV positive   OB History  No obstetric history on file.    Past Medical History:  Diagnosis Date   Allergy    Constipation    Diverticula of colon    Family history of adverse reaction to anesthesia    mother's BP increased   Hypertension    Insomnia    Migraine    migraines   MS (multiple sclerosis) ([facility])    Muscle spasm     Past Surgical History:  Procedure Laterality Date   BREAST BIOPSY Left 01/06/2018    2 areas  neg   BUNIONECTOMY     CESAREAN SECTION N/A 2006   COLONOSCOPY WITH PROPOFOL N/A 02/09/2021   Procedure: COLONOSCOPY WITH PROPOFOL;  Surgeon: [name], MD;  Location: [place];  Service: Endoscopy;  Laterality: N/A;   GANGLION CYST EXCISION Bilateral    Hands   POLYPECTOMY N/A 02/09/2021   Procedure: POLYPECTOMY;  Surgeon: [name], MD;  Location: [place];  Service: Endoscopy;  Laterality: N/A;    SOCIAL HISTORY:  Social History   Substance and Sexual Activity  Alcohol Use No    Social History   Substance and Sexual Activity  Drug Use No     Family History  Problem Relation Age of Onset   Diabetes Mother    Hypertension Mother    Cancer Father 53       Mouth   COPD Father    Hypertension Brother    Diabetes Brother    Breast cancer Neg Hx     ALLERGIES:  Penicillins  Current Outpatient Medications on File Prior to Visit  Medication Sig Dispense Refill   Amantadine HCl 100 MG tablet Take 200 mg by mouth 2 (two) times daily.     amitriptyline (ELAVIL) 25 MG tablet Take 1 tablet (25 mg total) by mouth at bedtime. 90 tablet 1   amLODipine (NORVASC) 2.5 MG tablet Take 1 tablet (2.5 mg total) by mouth daily. 90 tablet 1   Azelastine HCl 137 MCG/SPRAY SOLN PLACE 1  SPRAY INTO BOTH NOSTRILS 2 (TWO) TIMES DAILY. USE IN EACH NOSTRIL AS DIRECTED 90 mL 1   azithromycin (ZITHROMAX) 500 MG tablet Take 1 tablet (500 mg total) by mouth daily. 3 tablet 0   Cholecalciferol (VITAMIN D) 50 MCG (2000 UT) CAPS Take 50,000 Units by mouth once a week.     LINZESS 290 MCG CAPS capsule TAKE 1 CAPSULE (290 MCG TOTAL) BY MOUTH DAILY. 30 capsule 0   loratadine (CLARITIN) 10 MG tablet Take 1 tablet (10 mg total) by mouth daily. 90 tablet 0   meloxicam (MOBIC) 15 MG tablet Take 1 tablet (15 mg total) by mouth daily. 90 tablet 0   omeprazole (PRILOSEC) 40 MG capsule TAKE 1 CAPSULE BY MOUTH EVERY DAY 90 capsule 1   Peginterferon Beta-1a (PLEGRIDY) 125 MCG/0.5ML SOPN Inject into the skin. Inject the contents of 1 pen (125 mcg) under the skin every fourteen (14) days.     SUMAtriptan (IMITREX) 100 MG tablet Take 1 tablet (100 mg total) by mouth every 2 (two) hours as needed for migraine. May repeat in 2 hours if headache persists or recurs. 9 tablet 1   triamcinolone (KENALOG) 0.1 % Apply 1  application topically 2 (two) times daily. 453.6 g 0   No current facility-administered medications on file prior to visit.    Physical Exam: -Vitals:  BP (!) 158/110   Ht '5\' 2"'$  (1.575 m)   Wt 161 lb (73 kg)   BMI 29.45 kg/m  GEN: WD, WN, NAD.  A+ O x 3, good mood and affect. ABD:  NT, ND.  Soft, no masses.  No hernias noted.   Pelvic:   Vulva: Normal appearance.  No lesions.  Vagina: No lesions or abnormalities noted.  Support: Normal pelvic support.  Urethra No masses tenderness or scarring.  Meatus Normal size without lesions or prolapse.  Cervix: See below.  Anus: Normal exam.  No lesions.  Perineum: Normal exam.  No lesions.        Bimanual   Uterus: Normal size.  Non-tender.  Mobile.  AV.  Adnexae: No masses.  Non-tender to palpation.  Cul-de-sac: Negative for abnormality.   PROCEDURE: 1.  Urine Pregnancy Test:  not done 2.  Colposcopy performed with 4% acetic acid after  verbal consent obtained                                         -Aceto-white Lesions Location(s): 6 o'clock.              -Biopsy performed at 6 o'clock               -ECC indicated and performed: Yes.       -Biopsy sites made hemostatic with pressure, AgNO3, and/or Monsel's solution   -Satisfactory colposcopy: No.    -Evidence of Invasive cervical CA :  NO  ASSESSMENT:  Tammie Lopez is a 50 y.o. No obstetric history on file. here for  1. LGSIL on Pap smear of cervix   2. Pap smear of cervix shows high risk HPV present   .  PLAN:  I discussed the grading system of pap smears and HPV high risk viral types.  We will discuss and base management after colpo results return.     Prentice Docker, MD  [facility] 07/07/2021  10:42 AM   CC: Steele Sizer, MD [address] [place],  [place] [address]

## 2021-07-08 DIAGNOSIS — G35 Multiple sclerosis: Secondary | ICD-10-CM | POA: Diagnosis not present

## 2021-07-08 LAB — SURGICAL PATHOLOGY

## 2021-07-09 DIAGNOSIS — G35 Multiple sclerosis: Secondary | ICD-10-CM | POA: Diagnosis not present

## 2021-07-10 DIAGNOSIS — G35 Multiple sclerosis: Secondary | ICD-10-CM | POA: Diagnosis not present

## 2021-07-11 DIAGNOSIS — G35 Multiple sclerosis: Secondary | ICD-10-CM | POA: Diagnosis not present

## 2021-07-12 DIAGNOSIS — G35 Multiple sclerosis: Secondary | ICD-10-CM | POA: Diagnosis not present

## 2021-07-13 DIAGNOSIS — G35 Multiple sclerosis: Secondary | ICD-10-CM | POA: Diagnosis not present

## 2021-07-14 DIAGNOSIS — G35 Multiple sclerosis: Secondary | ICD-10-CM | POA: Diagnosis not present

## 2021-07-15 DIAGNOSIS — G35 Multiple sclerosis: Secondary | ICD-10-CM | POA: Diagnosis not present

## 2021-07-16 DIAGNOSIS — G35 Multiple sclerosis: Secondary | ICD-10-CM | POA: Diagnosis not present

## 2021-07-17 DIAGNOSIS — G35 Multiple sclerosis: Secondary | ICD-10-CM | POA: Diagnosis not present

## 2021-07-18 DIAGNOSIS — G35 Multiple sclerosis: Secondary | ICD-10-CM | POA: Diagnosis not present

## 2021-07-19 DIAGNOSIS — G35 Multiple sclerosis: Secondary | ICD-10-CM | POA: Diagnosis not present

## 2021-07-20 DIAGNOSIS — G35 Multiple sclerosis: Secondary | ICD-10-CM | POA: Diagnosis not present

## 2021-07-21 DIAGNOSIS — G35 Multiple sclerosis: Secondary | ICD-10-CM | POA: Diagnosis not present

## 2021-07-22 DIAGNOSIS — G35 Multiple sclerosis: Secondary | ICD-10-CM | POA: Diagnosis not present

## 2021-07-22 MED FILL — PLEGRIDY 125 MCG/0.5 ML SUBCUTANEOUS PEN INJECTOR: SUBCUTANEOUS | 28 days supply | Qty: 1 | Fill #2

## 2021-07-23 DIAGNOSIS — G35 Multiple sclerosis: Secondary | ICD-10-CM | POA: Diagnosis not present

## 2021-07-24 DIAGNOSIS — G35 Multiple sclerosis: Secondary | ICD-10-CM | POA: Diagnosis not present

## 2021-07-25 DIAGNOSIS — G35 Multiple sclerosis: Secondary | ICD-10-CM | POA: Diagnosis not present

## 2021-07-26 DIAGNOSIS — G35 Multiple sclerosis: Secondary | ICD-10-CM | POA: Diagnosis not present

## 2021-07-27 DIAGNOSIS — G35 Multiple sclerosis: Secondary | ICD-10-CM | POA: Diagnosis not present

## 2021-07-28 DIAGNOSIS — G35 Multiple sclerosis: Secondary | ICD-10-CM | POA: Diagnosis not present

## 2021-07-29 DIAGNOSIS — G35 Multiple sclerosis: Secondary | ICD-10-CM | POA: Diagnosis not present

## 2021-07-29 DIAGNOSIS — N951 Menopausal and female climacteric states: Secondary | ICD-10-CM | POA: Diagnosis not present

## 2021-07-29 DIAGNOSIS — I10 Essential (primary) hypertension: Secondary | ICD-10-CM | POA: Diagnosis not present

## 2021-07-29 DIAGNOSIS — R635 Abnormal weight gain: Secondary | ICD-10-CM | POA: Diagnosis not present

## 2021-07-30 DIAGNOSIS — G35 Multiple sclerosis: Secondary | ICD-10-CM | POA: Diagnosis not present

## 2021-07-31 DIAGNOSIS — G35 Multiple sclerosis: Secondary | ICD-10-CM | POA: Diagnosis not present

## 2021-08-01 DIAGNOSIS — G35 Multiple sclerosis: Secondary | ICD-10-CM | POA: Diagnosis not present

## 2021-08-02 DIAGNOSIS — G35 Multiple sclerosis: Secondary | ICD-10-CM | POA: Diagnosis not present

## 2021-08-03 DIAGNOSIS — G35 Multiple sclerosis: Secondary | ICD-10-CM | POA: Diagnosis not present

## 2021-08-04 DIAGNOSIS — R232 Flushing: Secondary | ICD-10-CM | POA: Diagnosis not present

## 2021-08-04 DIAGNOSIS — G35 Multiple sclerosis: Secondary | ICD-10-CM | POA: Diagnosis not present

## 2021-08-04 DIAGNOSIS — N921 Excessive and frequent menstruation with irregular cycle: Secondary | ICD-10-CM | POA: Diagnosis not present

## 2021-08-04 DIAGNOSIS — Z1331 Encounter for screening for depression: Secondary | ICD-10-CM | POA: Diagnosis not present

## 2021-08-04 DIAGNOSIS — I10 Essential (primary) hypertension: Secondary | ICD-10-CM | POA: Diagnosis not present

## 2021-08-04 DIAGNOSIS — R69 Illness, unspecified: Secondary | ICD-10-CM | POA: Diagnosis not present

## 2021-08-04 DIAGNOSIS — E7800 Pure hypercholesterolemia, unspecified: Secondary | ICD-10-CM | POA: Diagnosis not present

## 2021-08-04 DIAGNOSIS — Z1339 Encounter for screening examination for other mental health and behavioral disorders: Secondary | ICD-10-CM | POA: Diagnosis not present

## 2021-08-04 DIAGNOSIS — Z6829 Body mass index (BMI) 29.0-29.9, adult: Secondary | ICD-10-CM | POA: Diagnosis not present

## 2021-08-04 DIAGNOSIS — N951 Menopausal and female climacteric states: Secondary | ICD-10-CM | POA: Diagnosis not present

## 2021-08-05 ENCOUNTER — Ambulatory Visit: Payer: Medicare HMO | Admitting: Gastroenterology

## 2021-08-05 DIAGNOSIS — R69 Illness, unspecified: Secondary | ICD-10-CM | POA: Diagnosis not present

## 2021-08-05 DIAGNOSIS — G35 Multiple sclerosis: Secondary | ICD-10-CM | POA: Diagnosis not present

## 2021-08-05 DIAGNOSIS — Z7251 High risk heterosexual behavior: Secondary | ICD-10-CM | POA: Diagnosis not present

## 2021-08-06 DIAGNOSIS — G35 Multiple sclerosis: Secondary | ICD-10-CM | POA: Diagnosis not present

## 2021-08-07 DIAGNOSIS — G35 Multiple sclerosis: Secondary | ICD-10-CM | POA: Diagnosis not present

## 2021-08-08 DIAGNOSIS — G35 Multiple sclerosis: Secondary | ICD-10-CM | POA: Diagnosis not present

## 2021-08-09 DIAGNOSIS — G35 Multiple sclerosis: Secondary | ICD-10-CM | POA: Diagnosis not present

## 2021-08-10 DIAGNOSIS — G35 Multiple sclerosis: Secondary | ICD-10-CM | POA: Diagnosis not present

## 2021-08-11 DIAGNOSIS — Z6829 Body mass index (BMI) 29.0-29.9, adult: Secondary | ICD-10-CM | POA: Diagnosis not present

## 2021-08-11 DIAGNOSIS — I10 Essential (primary) hypertension: Secondary | ICD-10-CM | POA: Diagnosis not present

## 2021-08-11 DIAGNOSIS — G35 Multiple sclerosis: Secondary | ICD-10-CM | POA: Diagnosis not present

## 2021-08-12 ENCOUNTER — Ambulatory Visit (INDEPENDENT_AMBULATORY_CARE_PROVIDER_SITE_OTHER): Payer: Medicare HMO | Admitting: Gastroenterology

## 2021-08-12 ENCOUNTER — Encounter: Payer: Self-pay | Admitting: Gastroenterology

## 2021-08-12 VITALS — BP 153/96 | HR 81 | Temp 97.0°F | Ht 62.0 in | Wt 163.8 lb

## 2021-08-12 DIAGNOSIS — K5909 Other constipation: Secondary | ICD-10-CM | POA: Diagnosis not present

## 2021-08-12 DIAGNOSIS — G35 Multiple sclerosis: Secondary | ICD-10-CM | POA: Diagnosis not present

## 2021-08-12 DIAGNOSIS — R1012 Left upper quadrant pain: Secondary | ICD-10-CM

## 2021-08-12 MED ORDER — LINACLOTIDE 290 MCG PO CAPS: ORAL_CAPSULE | ORAL | 11 refills | Status: DC

## 2021-08-12 NOTE — Progress Notes (Signed)
Primary Care Physician: Steele Sizer, MD  Primary Gastroenterologist:  Dr. [name]  Chief Complaint  Patient presents with   Constipation    Medication refill     HPI: Tammie Lopez is a 50 y.o. female here with a history of having GERD and constipation for some time and underwent a colonoscopy by me back in March after seeing me in February.  The patient has been taking Linzess for her constipation and continues to report bloating.  The patient was seen by her primary care provider who noted: "She has been having early satiety and LUQ pain going on for months, usually worse with movement". The patient reports that she has abdominal pain in the left side of the abdomen that she reports to be better when she moves her bowels and worse when she eats or drinks.  She reports it to be associated with bloating.  The patient is on Linzess and states that she still does not have a spontaneous bowel movement frequently enough with the Linzess.  There is no report of any unexplained weight loss and the patient is within 1 pound of what she was in 2021.  She does report that she has been told that she does not eat enough and her last calorie count was under 1000 cal despite no appreciable weight loss.   Past Medical History:  Diagnosis Date   Allergy    Constipation    Diverticula of colon    Family history of adverse reaction to anesthesia    mother's BP increased   Hypertension    Insomnia    Migraine    migraines   MS (multiple sclerosis) (HCC)    Muscle spasm     Current Outpatient Medications  Medication Sig Dispense Refill   Amantadine HCl 100 MG tablet Take 200 mg by mouth 2 (two) times daily.     amitriptyline (ELAVIL) 25 MG tablet Take 1 tablet (25 mg total) by mouth at bedtime. 90 tablet 1   amLODipine (NORVASC) 2.5 MG tablet Take 1 tablet (2.5 mg total) by mouth daily. 90 tablet 1   Azelastine HCl 137 MCG/SPRAY SOLN PLACE 1 SPRAY INTO BOTH NOSTRILS 2 (TWO) TIMES  DAILY. USE IN EACH NOSTRIL AS DIRECTED 90 mL 1   azithromycin (ZITHROMAX) 500 MG tablet Take 1 tablet (500 mg total) by mouth daily. 3 tablet 0   Cholecalciferol (VITAMIN D) 50 MCG (2000 UT) CAPS Take 50,000 Units by mouth once a week.     loratadine (CLARITIN) 10 MG tablet Take 1 tablet (10 mg total) by mouth daily. 90 tablet 0   meloxicam (MOBIC) 15 MG tablet Take 1 tablet (15 mg total) by mouth daily. 90 tablet 0   omeprazole (PRILOSEC) 40 MG capsule TAKE 1 CAPSULE BY MOUTH EVERY DAY 90 capsule 1   Peginterferon Beta-1a (PLEGRIDY) 125 MCG/0.5ML SOPN Inject into the skin. Inject the contents of 1 pen (125 mcg) under the skin every fourteen (14) days.     SUMAtriptan (IMITREX) 100 MG tablet Take 1 tablet (100 mg total) by mouth every 2 (two) hours as needed for migraine. May repeat in 2 hours if headache persists or recurs. 9 tablet 1   triamcinolone (KENALOG) 0.1 % Apply 1 application topically 2 (two) times daily. 453.6 g 0   linaclotide (LINZESS) 290 MCG CAPS capsule TAKE 1 CAPSULE (290 MCG TOTAL) BY MOUTH DAILY. 30 capsule 11   No current facility-administered medications for this visit.    Allergies as of  08/12/2021 - Review Complete 08/12/2021  Allergen Reaction Noted   Penicillins Hives 08/26/2015    ROS:  General: Negative for anorexia, weight loss, fever, chills, fatigue, weakness. ENT: Negative for hoarseness, difficulty swallowing , nasal congestion. CV: Negative for chest pain, angina, palpitations, dyspnea on exertion, peripheral edema.  Respiratory: Negative for dyspnea at rest, dyspnea on exertion, cough, sputum, wheezing.  GI: See history of present illness. GU:  Negative for dysuria, hematuria, urinary incontinence, urinary frequency, nocturnal urination.  Endo: Negative for unusual weight change.    Physical Examination:   BP (!) 153/96 (BP Location: Left Arm, Patient Position: Sitting, Cuff Size: Large)   Pulse 81   Temp (!) 97 F (36.1 C) (Temporal)   Ht '5\' 2"'$   (1.575 m)   Wt 163 lb 12.8 oz (74.3 kg)   BMI 29.96 kg/m   General: Well-nourished, well-developed in no acute distress.  Eyes: No icterus. Conjunctivae pink. Lungs: Clear to auscultation bilaterally. Non-labored. Heart: Regular rate and rhythm, no murmurs rubs or gallops.  Abdomen: Bowel sounds are normal, positive tenderness to 1 finger palpation while flexing the abdominal wall muscles, nondistended, no hepatosplenomegaly or masses, no abdominal bruits or hernia , no rebound or guarding.   Extremities: No lower extremity edema. No clubbing or deformities. Neuro: Alert and oriented x 3.  Grossly intact. Skin: Warm and dry, no jaundice.   Psych: Alert and cooperative, normal mood and affect.  Labs:    Imaging Studies: No results found.  Assessment and Plan:   Tammie Lopez is a 50 y.o. y/o female who comes in today with left upper quadrant discomfort and constipation.  The physical exam shows the abdominal discomfort to be reproducible with 1 finger palpation while flexing abdominal wall muscles consistent with musculoskeletal pain.  The patient also reports that she has back pain which is quite commonly seen with musculoskeletal abdominal pain.  The patient has been told to avoid things that cause the pain to be exacerbated and it may be bearing down to have a bowel movement due to her constipation exacerbating her symptoms.  The patient has been told to add MiraLAX to her Linzess to see if that helps her symptoms.  The patient has been explained the plan and agrees with it.   [name], MD. Marval Regal    Note: This dictation was prepared with Dragon dictation along with smaller phrase technology. Any transcriptional errors that result from this process are unintentional.

## 2021-08-13 DIAGNOSIS — G35 Multiple sclerosis: Secondary | ICD-10-CM | POA: Diagnosis not present

## 2021-08-14 DIAGNOSIS — G35 Multiple sclerosis: Secondary | ICD-10-CM | POA: Diagnosis not present

## 2021-08-15 DIAGNOSIS — G35 Multiple sclerosis: Secondary | ICD-10-CM | POA: Diagnosis not present

## 2021-08-16 DIAGNOSIS — G35 Multiple sclerosis: Secondary | ICD-10-CM | POA: Diagnosis not present

## 2021-08-17 DIAGNOSIS — G35 Multiple sclerosis: Secondary | ICD-10-CM | POA: Diagnosis not present

## 2021-08-18 DIAGNOSIS — G35 Multiple sclerosis: Secondary | ICD-10-CM | POA: Diagnosis not present

## 2021-08-19 DIAGNOSIS — G35 Multiple sclerosis: Secondary | ICD-10-CM | POA: Diagnosis not present

## 2021-08-19 MED FILL — PLEGRIDY 125 MCG/0.5 ML SUBCUTANEOUS PEN INJECTOR: SUBCUTANEOUS | 28 days supply | Qty: 1 | Fill #3

## 2021-08-20 DIAGNOSIS — G35 Multiple sclerosis: Secondary | ICD-10-CM | POA: Diagnosis not present

## 2021-08-21 DIAGNOSIS — G35 Multiple sclerosis: Secondary | ICD-10-CM | POA: Diagnosis not present

## 2021-08-22 DIAGNOSIS — G35 Multiple sclerosis: Secondary | ICD-10-CM | POA: Diagnosis not present

## 2021-08-23 DIAGNOSIS — G35 Multiple sclerosis: Secondary | ICD-10-CM | POA: Diagnosis not present

## 2021-08-24 DIAGNOSIS — G35 Multiple sclerosis: Secondary | ICD-10-CM | POA: Diagnosis not present

## 2021-08-25 DIAGNOSIS — G35 Multiple sclerosis: Secondary | ICD-10-CM | POA: Diagnosis not present

## 2021-08-26 DIAGNOSIS — G35 Multiple sclerosis: Secondary | ICD-10-CM | POA: Diagnosis not present

## 2021-08-27 DIAGNOSIS — G35 Multiple sclerosis: Secondary | ICD-10-CM | POA: Diagnosis not present

## 2021-08-28 DIAGNOSIS — G35 Multiple sclerosis: Secondary | ICD-10-CM | POA: Diagnosis not present

## 2021-08-29 DIAGNOSIS — G35 Multiple sclerosis: Secondary | ICD-10-CM | POA: Diagnosis not present

## 2021-08-30 DIAGNOSIS — G35 Multiple sclerosis: Secondary | ICD-10-CM | POA: Diagnosis not present

## 2021-08-31 DIAGNOSIS — G35 Multiple sclerosis: Secondary | ICD-10-CM | POA: Diagnosis not present

## 2021-09-01 DIAGNOSIS — G35 Multiple sclerosis: Secondary | ICD-10-CM | POA: Diagnosis not present

## 2021-09-02 DIAGNOSIS — G35 Multiple sclerosis: Secondary | ICD-10-CM | POA: Diagnosis not present

## 2021-09-03 DIAGNOSIS — G35 Multiple sclerosis: Secondary | ICD-10-CM | POA: Diagnosis not present

## 2021-09-04 DIAGNOSIS — G35 Multiple sclerosis: Secondary | ICD-10-CM | POA: Diagnosis not present

## 2021-09-05 DIAGNOSIS — G35 Multiple sclerosis: Secondary | ICD-10-CM | POA: Diagnosis not present

## 2021-09-06 DIAGNOSIS — G35 Multiple sclerosis: Secondary | ICD-10-CM | POA: Diagnosis not present

## 2021-09-07 DIAGNOSIS — G35 Multiple sclerosis: Secondary | ICD-10-CM | POA: Diagnosis not present

## 2021-09-08 DIAGNOSIS — G35 Multiple sclerosis: Secondary | ICD-10-CM | POA: Diagnosis not present

## 2021-09-09 DIAGNOSIS — G35 Multiple sclerosis: Secondary | ICD-10-CM | POA: Diagnosis not present

## 2021-09-10 DIAGNOSIS — G35 Multiple sclerosis: Secondary | ICD-10-CM | POA: Diagnosis not present

## 2021-09-10 DIAGNOSIS — H524 Presbyopia: Secondary | ICD-10-CM | POA: Diagnosis not present

## 2021-09-11 DIAGNOSIS — G35 Multiple sclerosis: Secondary | ICD-10-CM | POA: Diagnosis not present

## 2021-09-12 DIAGNOSIS — G35 Multiple sclerosis: Secondary | ICD-10-CM | POA: Diagnosis not present

## 2021-09-13 DIAGNOSIS — G35 Multiple sclerosis: Secondary | ICD-10-CM | POA: Diagnosis not present

## 2021-09-14 DIAGNOSIS — G35 Multiple sclerosis: Secondary | ICD-10-CM | POA: Diagnosis not present

## 2021-09-14 NOTE — Progress Notes (Signed)
Name: Tammie Lopez   MRN: [id number]    DOB: 28-Apr-1971   Date:09/15/2021       Progress Note  Subjective  Chief Complaint  Follow Up  HPI  HTN: bp spikes when on prednisone, today it is at goal, she is on low dose norvasc. She has been compliant with medications now. She denies chest pain, dizziness or palpitation    MS: she has been off Tacfidera due to leucopenia, she goes to [facility], she is taking Plegridy, no longer having headaches . She has lesions on thoracic spine. She states she does not like the new medication because has spots on injection sites, about has headaches for two days after injections, she will discuss it with neurologist   Low back pain: going on for past couple of years, improves with meloxicam but not with flexeril, pain is aching like and constant, can radiate to pelvic area but not her legs. Unchanged  Migraine: she states episodes of  are usually twice a week, seems to be triggered by strong scents, lack of sleep and recently cutting down on caffeine. She states headache is described as pounding  behind her eyes ( usually unilateral) , associated with phonophobia and photophobia.She states Imitrex causes side effects  ( nausea and does not make it go away) and we will try Ubrelvy   Obesity: history of insulin resistance, took Trulicity, Ozempic  and Metformin in the past , she states she has been walking, stopped drinking sodas two weeks ago, still gaining weight. She went to [place] for hot flashes, she spoke to nutritionist. She asked about going back on GLP-1 agonist, explained likely not covered by her insurance but we can send a prescription   Pre-diabetes: last A1C improved, but used to be elevated at 5.7 % , she also has HTN and increase in abdominal girth, we will try sending Ozempic to pharmacy  Insomnia: she would like to go back on Trazodone, she has some old pills at home   Eczema: doing well at this time    Chronic constipation/GERD:  she is always feeling bloated, takes Linzess occasionally, had colonoscopy done by Dr. 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 07/16/2020   Low TSH level 09/19/2019   Obesity (BMI 30-39.9) 12/06/2017   Insulin resistance 09/27/2017   Essential hypertension 09/21/2017   Right sided sciatica 09/21/2017   Major depression, recurrent ([place]) 05/24/2017   Migraine without aura, not intractable 12/09/2016   Nickel allergy 12/09/2016   GERD without esophagitis 12/09/2016   Other neutropenia ([place]) 12/09/2016   Dyslipidemia 12/09/2016   Diverticulosis 12/09/2016   Proctitis 12/09/2016   Allergic rhinitis, seasonal 12/09/2016   Chronic constipation 12/09/2016   Vitamin D deficiency 08/06/2013   MS (multiple sclerosis) ([place]) 04/17/2013    Past Surgical History:  Procedure Laterality Date   BREAST BIOPSY Left 01/06/2018    2 areas  neg   BUNIONECTOMY     CESAREAN SECTION N/A 2006   COLONOSCOPY WITH PROPOFOL N/A  02/09/2021   Procedure: COLONOSCOPY WITH PROPOFOL;  Surgeon: [name], MD;  Location: [facility];  Service: Endoscopy;  Laterality: N/A;   GANGLION CYST EXCISION Bilateral    Hands   POLYPECTOMY N/A 02/09/2021   Procedure: POLYPECTOMY;  Surgeon: [name], MD;  Location: [facility];  Service: Endoscopy;  Laterality: N/A;    Family History  Problem Relation Age of Onset   Diabetes Mother    Hypertension Mother    Cancer Father 75       Mouth   COPD Father    Hypertension Brother    Diabetes  Brother    Breast cancer Neg Hx     Social History   Tobacco Use   Smoking status: Never   Smokeless tobacco: Never   Tobacco comments:    smoking cessation materials not required  Substance Use Topics   Alcohol use: No     Current Outpatient Medications:    amLODipine (NORVASC) 2.5 MG tablet, Take 1 tablet (2.5 mg total) by mouth daily., Disp: 90 tablet, Rfl: 1   Cholecalciferol (VITAMIN D) 50 MCG (2000 UT) CAPS, Take 50,000 Units by mouth once a week., Disp: , Rfl:    linaclotide (LINZESS) 290 MCG CAPS capsule, TAKE 1 CAPSULE (290 MCG TOTAL) BY MOUTH DAILY., Disp: 30 capsule, Rfl: 11   meloxicam (MOBIC) 15 MG tablet, Take 1 tablet (15 mg total) by mouth daily., Disp: 90 tablet, Rfl: 0   omeprazole (PRILOSEC) 40 MG capsule, TAKE 1 CAPSULE BY MOUTH EVERY DAY, Disp: 90 capsule, Rfl: 1   Peginterferon Beta-1a (PLEGRIDY) 125 MCG/0.5ML SOPN, Inject into the skin. Inject the contents of 1 pen (125 mcg) under the skin every fourteen (14) days., Disp: , Rfl:    Semaglutide,0.25 or 0.5MG /DOS, (OZEMPIC, 0.25 OR 0.5 MG/DOSE,) 2 MG/1.5ML SOPN, Inject 0.5 mg into the skin once a week., Disp: 4.5 mL, Rfl: 0   SUMAtriptan (IMITREX) 100 MG tablet, Take 1 tablet (100 mg total) by mouth every 2 (two) hours as needed for migraine. May repeat in 2 hours if headache persists or recurs., Disp: 9 tablet, Rfl: 1   traZODone (DESYREL) 50 MG tablet, Take 0.5-1 tablets (25-50 mg total) by mouth at bedtime as needed for sleep., Disp: 90 tablet, Rfl: 0   triamcinolone (KENALOG) 0.1 %, Apply 1 application topically 2 (two) times daily., Disp: 453.6 g, Rfl: 0   Ubrogepant (UBRELVY) 100 MG TABS, Take 1 tablet by mouth daily as needed. For migraine headaches, Disp: 16 tablet, Rfl: 1   loratadine (CLARITIN) 10 MG tablet, Take 1 tablet (10 mg total) by mouth daily. (Patient not taking: Reported on 09/15/2021), Disp: 90 tablet, Rfl: 0   progesterone (PROMETRIUM) 100 MG capsule, Take 100 mg by mouth at bedtime., Disp: ,  Rfl:   Allergies  Allergen Reactions   Penicillins Hives    I personally reviewed active problem list, medication list, allergies, family history, social history, health maintenance with the patient/caregiver today.   ROS  Constitutional: Negative for fever , positive for weight change.  Respiratory: Negative for cough and shortness of breath.   Cardiovascular: Negative for chest pain or palpitations.  Gastrointestinal: Negative for abdominal pain, no bowel changes.  Musculoskeletal: Negative for gait problem or joint swelling.  Skin: Negative for rash.  Neurological: Negative for dizziness or headache.  No other specific complaints in a complete review of systems (except as listed in HPI above).   Objective  Vitals:   09/15/21 0936  BP: 136/82  Pulse:  87  Resp: 16  Temp: 98 F (36.7 C)  SpO2: 98%  Weight: 167 lb (75.8 kg)  Height: 5\' 2"  (1.575 m)    Body mass index is 30.54 kg/m.  Physical Exam  Constitutional: Patient appears well-developed and well-nourished. Obese  No distress.  HEENT: head atraumatic, normocephalic, pupils equal and reactive to light, neck supple Cardiovascular: Normal rate, regular rhythm and normal heart sounds.  No murmur heard. No BLE edema. Pulmonary/Chest: Effort normal and breath sounds normal. No respiratory distress. Abdominal: Soft.  There is no tenderness. Psychiatric: Patient has a normal mood and affect. behavior is normal. Judgment and thought content normal.   Recent Results (from the past 2160 hour(s))  Surgical pathology     Status: None   Collection Time: 07/07/21 10:42 AM  Result Value Ref Range   SURGICAL PATHOLOGY      SURGICAL PATHOLOGY CASE: [id number] PATIENT: [facility] Surgical Pathology Report     Clinical History: LGSIL, high risk HPV (cm)     FINAL MICROSCOPIC DIAGNOSIS:  A. CERVIX, 6 O'CLOCK, BIOPSY: - Koilocytic atypia consistent with low-grade squamous intraepithelial lesion, CIN-1.  B.  ENDOCERVIX, CURETTAGE: - Benign endocervical mucosa.    GROSS DESCRIPTION:  Specimen A: Received in formalin is a pink-gray soft tissue fragment that is submitted in toto.  Size: 0.35 cm, 1 block submitted.  Specimen B: Received in formalin is scant blood tinged mucus that is entirely submitted in one block.  SW 07/07/2021   Final Diagnosis performed by Claudette Laws, MD.   Electronically signed 07/08/2021 Technical component performed at [facility]. [facility], [place] [address], [place], [place] [address].  Professional component performed at [facility], [place] [address] [place], [place] [address].  Immunohistochemistry Technical component  (if applicable) was performed at [facility]. [address], [place], [place], [place] [address].   IMMUNOHISTOCHEMISTRY DISCLAIMER (if applicable): Some of these immunohistochemical stains may have been developed and the performance characteristics determine by [facility]. Some may not have been cleared or approved by the U.S. Food and Drug Administration. The FDA has determined that such clearance or approval is not necessary. This test is used for clinical purposes. It should not be regarded as investigational or for research. This laboratory is certified under the [place] (CLIA-88) as qualified to perform high complexity clinical laboratory testing.  The controls stained appropriately.      PHQ2/9: Depression screen [facility] 2/9 09/15/2021 06/11/2021 01/20/2021 12/18/2020 10/15/2020  Decreased Interest 0 0 0 0 0  Down, Depressed, Hopeless 1 0 0 0 0  PHQ - 2 Score 1 0 0 0 0  Altered sleeping 1 1 - - -  Tired, decreased energy 0 1 - - -  Change in appetite 0 3 - - -  Feeling bad or failure about yourself  0 0 - - -  Trouble concentrating 0 0 - - -  Moving slowly or fidgety/restless 0 0 - - -  Suicidal thoughts 0 0 - - -  PHQ-9 Score 2 5 -  - -  Difficult doing work/chores - - - - -  Some recent data might be hidden    phq 9 is negative   Fall Risk: Fall Risk  09/15/2021 06/11/2021 01/20/2021 12/18/2020 10/15/2020  Falls in the past year? 0 0 0 0 0  Number falls in past yr: 0 0 0 0 0  Injury with Fall? 0 0 0 0 0  Risk for  fall due to : No Fall Risks - - No Fall Risks -  Follow up Falls prevention discussed - - Falls prevention discussed -      Functional Status Survey: Is the patient deaf or have difficulty hearing?: No Does the patient have difficulty seeing, even when wearing glasses/contacts?: No Does the patient have difficulty concentrating, remembering, or making decisions?: No Does the patient have difficulty walking or climbing stairs?: Yes Does the patient have difficulty dressing or bathing?: No Does the patient have difficulty doing errands alone such as visiting a doctor's office or shopping?: No    Assessment & Plan  1. Migraine without aura and without status migrainosus, not intractable  - Ubrogepant (UBRELVY) 100 MG TABS; Take 1 tablet by mouth daily as needed. For migraine headaches  Dispense: 16 tablet; Refill: 1  2. GERD without esophagitis   3. Hypertension, benign  At goal today   4. Dyslipidemia  Lipid panel back to normal on her last labs.   5. MS (multiple sclerosis) ([place])  Keep follow up with neurologist   6. Other neutropenia (HCC)  Last level was 2.9, off Tecfidera now   7. Vitamin D deficiency disease  Taking otc and doing well  8. Major depression in remission [facility])  Still frustrated about medical problems and also weight, but stable.   9. Low grade squamous intraepithelial lesion on cytologic smear of cervix (LGSIL)  Seeing Dr. Glennon Mac, going to have it repeated in 1 year    50. Pre-diabetes  - Semaglutide,0.25 or 0.5MG /DOS, (OZEMPIC, 0.25 OR 0.5 MG/DOSE,) 2 MG/1.5ML SOPN; Inject 0.5 mg into the skin once a week.  Dispense: 4.5 mL; Refill: 0  11. Insomnia  due to medical condition  - traZODone (DESYREL) 50 MG tablet; Take 0.5-1 tablets (25-50 mg total) by mouth at bedtime as needed for sleep.  Dispense: 90 tablet; Refill: 0

## 2021-09-15 ENCOUNTER — Other Ambulatory Visit: Payer: Self-pay

## 2021-09-15 ENCOUNTER — Ambulatory Visit (INDEPENDENT_AMBULATORY_CARE_PROVIDER_SITE_OTHER): Payer: Medicare HMO | Admitting: Family Medicine

## 2021-09-15 ENCOUNTER — Encounter: Payer: Self-pay | Admitting: Family Medicine

## 2021-09-15 VITALS — BP 136/82 | HR 87 | Temp 98.0°F | Resp 16 | Ht 62.0 in | Wt 167.0 lb

## 2021-09-15 DIAGNOSIS — R7303 Prediabetes: Secondary | ICD-10-CM

## 2021-09-15 DIAGNOSIS — K219 Gastro-esophageal reflux disease without esophagitis: Secondary | ICD-10-CM | POA: Diagnosis not present

## 2021-09-15 DIAGNOSIS — R87612 Low grade squamous intraepithelial lesion on cytologic smear of cervix (LGSIL): Secondary | ICD-10-CM

## 2021-09-15 DIAGNOSIS — F325 Major depressive disorder, single episode, in full remission: Secondary | ICD-10-CM

## 2021-09-15 DIAGNOSIS — I10 Essential (primary) hypertension: Secondary | ICD-10-CM | POA: Diagnosis not present

## 2021-09-15 DIAGNOSIS — G35 Multiple sclerosis: Secondary | ICD-10-CM | POA: Diagnosis not present

## 2021-09-15 DIAGNOSIS — E559 Vitamin D deficiency, unspecified: Secondary | ICD-10-CM

## 2021-09-15 DIAGNOSIS — D708 Other neutropenia: Secondary | ICD-10-CM

## 2021-09-15 DIAGNOSIS — R69 Illness, unspecified: Secondary | ICD-10-CM | POA: Diagnosis not present

## 2021-09-15 DIAGNOSIS — G35D Multiple sclerosis, unspecified: Secondary | ICD-10-CM

## 2021-09-15 DIAGNOSIS — E785 Hyperlipidemia, unspecified: Secondary | ICD-10-CM

## 2021-09-15 DIAGNOSIS — G43009 Migraine without aura, not intractable, without status migrainosus: Secondary | ICD-10-CM | POA: Diagnosis not present

## 2021-09-15 DIAGNOSIS — G4701 Insomnia due to medical condition: Secondary | ICD-10-CM

## 2021-09-15 MED ORDER — TRAZODONE HCL 50 MG PO TABS: 25.0000 mg | ORAL_TABLET | Freq: Every evening | ORAL | 0 refills | Status: DC | PRN

## 2021-09-15 MED ORDER — OZEMPIC (0.25 OR 0.5 MG/DOSE) 2 MG/1.5ML ~~LOC~~ SOPN: 0.5000 mg | PEN_INJECTOR | SUBCUTANEOUS | 0 refills | Status: DC

## 2021-09-15 MED ORDER — UBRELVY 100 MG PO TABS: 1.0000 | ORAL_TABLET | Freq: Every day | ORAL | 1 refills | Status: DC | PRN

## 2021-09-16 DIAGNOSIS — G35 Multiple sclerosis: Secondary | ICD-10-CM | POA: Diagnosis not present

## 2021-09-17 DIAGNOSIS — G35 Multiple sclerosis: Secondary | ICD-10-CM | POA: Diagnosis not present

## 2021-09-18 DIAGNOSIS — G35 Multiple sclerosis: Secondary | ICD-10-CM | POA: Diagnosis not present

## 2021-09-19 DIAGNOSIS — G35 Multiple sclerosis: Secondary | ICD-10-CM | POA: Diagnosis not present

## 2021-09-20 DIAGNOSIS — G35 Multiple sclerosis: Secondary | ICD-10-CM | POA: Diagnosis not present

## 2021-09-21 DIAGNOSIS — G35 Multiple sclerosis: Secondary | ICD-10-CM | POA: Diagnosis not present

## 2021-09-22 DIAGNOSIS — H5213 Myopia, bilateral: Secondary | ICD-10-CM | POA: Diagnosis not present

## 2021-09-22 DIAGNOSIS — G35 Multiple sclerosis: Secondary | ICD-10-CM | POA: Diagnosis not present

## 2021-09-23 DIAGNOSIS — G35 Multiple sclerosis: Secondary | ICD-10-CM | POA: Diagnosis not present

## 2021-09-24 DIAGNOSIS — G35 Multiple sclerosis: Secondary | ICD-10-CM | POA: Diagnosis not present

## 2021-09-25 DIAGNOSIS — G35 Multiple sclerosis: Secondary | ICD-10-CM | POA: Diagnosis not present

## 2021-09-26 DIAGNOSIS — G35 Multiple sclerosis: Secondary | ICD-10-CM | POA: Diagnosis not present

## 2021-09-27 DIAGNOSIS — G35 Multiple sclerosis: Secondary | ICD-10-CM | POA: Diagnosis not present

## 2021-09-28 DIAGNOSIS — G35 Multiple sclerosis: Secondary | ICD-10-CM | POA: Diagnosis not present

## 2021-09-29 DIAGNOSIS — H524 Presbyopia: Secondary | ICD-10-CM | POA: Diagnosis not present

## 2021-09-29 DIAGNOSIS — G35 Multiple sclerosis: Secondary | ICD-10-CM | POA: Diagnosis not present

## 2021-09-30 DIAGNOSIS — G35 Multiple sclerosis: Secondary | ICD-10-CM | POA: Diagnosis not present

## 2021-10-01 DIAGNOSIS — G35 Multiple sclerosis: Secondary | ICD-10-CM | POA: Diagnosis not present

## 2021-10-01 MED FILL — PLEGRIDY 125 MCG/0.5 ML SUBCUTANEOUS PEN INJECTOR: SUBCUTANEOUS | 28 days supply | Qty: 1 | Fill #4

## 2021-10-02 DIAGNOSIS — G35 Multiple sclerosis: Secondary | ICD-10-CM | POA: Diagnosis not present

## 2021-10-03 DIAGNOSIS — G35 Multiple sclerosis: Secondary | ICD-10-CM | POA: Diagnosis not present

## 2021-10-04 DIAGNOSIS — G35 Multiple sclerosis: Secondary | ICD-10-CM | POA: Diagnosis not present

## 2021-10-05 DIAGNOSIS — G35 Multiple sclerosis: Secondary | ICD-10-CM | POA: Diagnosis not present

## 2021-10-06 DIAGNOSIS — G35 Multiple sclerosis: Secondary | ICD-10-CM | POA: Diagnosis not present

## 2021-10-07 DIAGNOSIS — G35 Multiple sclerosis: Secondary | ICD-10-CM | POA: Diagnosis not present

## 2021-10-08 DIAGNOSIS — G35 Multiple sclerosis: Secondary | ICD-10-CM | POA: Diagnosis not present

## 2021-10-09 DIAGNOSIS — G35 Multiple sclerosis: Secondary | ICD-10-CM | POA: Diagnosis not present

## 2021-10-10 DIAGNOSIS — G35 Multiple sclerosis: Secondary | ICD-10-CM | POA: Diagnosis not present

## 2021-10-11 DIAGNOSIS — G35 Multiple sclerosis: Secondary | ICD-10-CM | POA: Diagnosis not present

## 2021-10-12 DIAGNOSIS — G35 Multiple sclerosis: Secondary | ICD-10-CM | POA: Diagnosis not present

## 2021-10-13 DIAGNOSIS — G35 Multiple sclerosis: Secondary | ICD-10-CM | POA: Diagnosis not present

## 2021-10-14 DIAGNOSIS — G35 Multiple sclerosis: Secondary | ICD-10-CM | POA: Diagnosis not present

## 2021-10-15 DIAGNOSIS — G35 Multiple sclerosis: Secondary | ICD-10-CM | POA: Diagnosis not present

## 2021-10-16 DIAGNOSIS — G35 Multiple sclerosis: Secondary | ICD-10-CM | POA: Diagnosis not present

## 2021-10-17 DIAGNOSIS — G35 Multiple sclerosis: Secondary | ICD-10-CM | POA: Diagnosis not present

## 2021-10-18 DIAGNOSIS — G35 Multiple sclerosis: Secondary | ICD-10-CM | POA: Diagnosis not present

## 2021-10-19 DIAGNOSIS — G35 Multiple sclerosis: Secondary | ICD-10-CM | POA: Diagnosis not present

## 2021-10-20 ENCOUNTER — Other Ambulatory Visit: Payer: Self-pay

## 2021-10-20 ENCOUNTER — Encounter: Payer: Self-pay | Admitting: Emergency Medicine

## 2021-10-20 ENCOUNTER — Ambulatory Visit
Admission: EM | Admit: 2021-10-20 | Discharge: 2021-10-20 | Disposition: A | Discharge status: Home / Self Care | Payer: Medicare HMO | Attending: Internal Medicine | Admitting: Internal Medicine

## 2021-10-20 DIAGNOSIS — Z20822 Contact with and (suspected) exposure to covid-19: Secondary | ICD-10-CM | POA: Diagnosis not present

## 2021-10-20 DIAGNOSIS — G35 Multiple sclerosis: Secondary | ICD-10-CM | POA: Diagnosis not present

## 2021-10-20 DIAGNOSIS — J111 Influenza due to unidentified influenza virus with other respiratory manifestations: Secondary | ICD-10-CM | POA: Insufficient documentation

## 2021-10-20 LAB — SARS CORONAVIRUS 2 (TAT 6-24 HRS): SARS Coronavirus 2: NEGATIVE

## 2021-10-20 MED ORDER — OSELTAMIVIR PHOSPHATE 75 MG PO CAPS: 75.0000 mg | ORAL_CAPSULE | Freq: Two times a day (BID) | ORAL | 0 refills | Status: DC

## 2021-10-20 MED ORDER — BENZONATATE 200 MG PO CAPS: 200.0000 mg | ORAL_CAPSULE | Freq: Three times a day (TID) | ORAL | 1 refills | Status: DC | PRN

## 2021-10-20 MED ORDER — PREDNISONE 50 MG PO TABS: 50.0000 mg | ORAL_TABLET | Freq: Every day | ORAL | 0 refills | Status: DC

## 2021-10-20 NOTE — ED Provider Notes (Signed)
[facility]    CSN: [id number] Arrival date & time: 10/20/21  0851      History   Chief Complaint Chief Complaint  Patient presents with   Cough    HPI Tammie Lopez is a 50 y.o. female.  She presents today with onset of respiratory symptoms 11/17.  She woke up feeling truly terrible on 11/19, with headache, cough productive of green phlegm, myalgias, malaise.  She also has some runny and congested nose, little bit of scratchy throat.  Tactile temp.  No nausea/vomiting, no diarrhea, no change in smell or taste.  She sat next to someone at the football game on 11/18 who has been diagnosed with the flu. 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  HPI  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 07/16/2020   Low TSH level 09/19/2019   Obesity (BMI 30-39.9) 12/06/2017   Insulin resistance 09/27/2017   Essential hypertension 09/21/2017   Right sided sciatica 09/21/2017   Major depression, recurrent ([place]) 05/24/2017   Migraine without aura, not intractable 12/09/2016   Nickel allergy 12/09/2016   GERD without esophagitis 12/09/2016   Other neutropenia ([place]) 12/09/2016   Dyslipidemia 12/09/2016   Diverticulosis 12/09/2016   Proctitis 12/09/2016   Allergic rhinitis, seasonal 12/09/2016   Chronic constipation 12/09/2016   Vitamin D deficiency 08/06/2013   MS (multiple sclerosis) ([place]) 04/17/2013    Past Surgical History:  Procedure Laterality Date   BREAST BIOPSY Left 01/06/2018    2 areas  neg    BUNIONECTOMY     CESAREAN SECTION N/A 2006   COLONOSCOPY WITH PROPOFOL N/A 02/09/2021   Procedure: COLONOSCOPY WITH PROPOFOL;  Surgeon: [name], MD;  Location: [place];  Service: Endoscopy;  Laterality: N/A;   GANGLION CYST EXCISION Bilateral    Hands   POLYPECTOMY N/A 02/09/2021   Procedure: POLYPECTOMY;  Surgeon: [name], MD;  Location: [place];  Service: Endoscopy;  Laterality: N/A;      Home Medications    Prior to Admission medications   Medication Sig Start Date End Date Taking? Authorizing Provider  amLODipine (NORVASC) 2.5 MG tablet Take 1 tablet (2.5 mg total) by mouth daily. 06/11/21  Yes Sowles, Drue Stager, MD  benzonatate (TESSALON) 200 MG capsule Take 1 capsule (200 mg total) by mouth 3 (three) times daily as needed for cough. 10/20/21  Yes Wynona Luna, MD  Cholecalciferol (VITAMIN D) 50 MCG (2000 UT) CAPS Take 50,000 Units by mouth once a week.   Yes [provider]  linaclotide (LINZESS) 290 MCG CAPS capsule TAKE 1 CAPSULE (290 MCG TOTAL) BY MOUTH DAILY. 08/12/21  Yes [name], MD  meloxicam (MOBIC) 15 MG tablet Take 1 tablet (15 mg total) by mouth daily. 06/11/21  Yes Sowles, Drue Stager, MD  omeprazole (PRILOSEC) 40 MG capsule TAKE 1 CAPSULE BY MOUTH EVERY DAY 06/11/21  Yes Sowles, Drue Stager, MD  oseltamivir (TAMIFLU) 75 MG capsule  Take 1 capsule (75 mg total) by mouth every 12 (twelve) hours. 10/20/21  Yes Wynona Luna, MD  Peginterferon Beta-1a (PLEGRIDY) 125 MCG/0.5ML SOPN Inject into the skin. Inject the contents of 1 pen (125 mcg) under the skin every fourteen (14) days. 11/25/20  Yes [provider]  predniSONE (DELTASONE) 50 MG tablet Take 1 tablet (50 mg total) by mouth daily. 10/20/21  Yes Wynona Luna, MD  progesterone (PROMETRIUM) 100 MG capsule Take 100 mg by mouth at bedtime. 08/05/21  Yes [provider]  Semaglutide,0.25 or 0.5MG /DOS, (OZEMPIC, 0.25 OR 0.5 MG/DOSE,) 2 MG/1.5ML SOPN  Inject 0.5 mg into the skin once a week. 09/15/21  Yes Sowles, Drue Stager, MD  SUMAtriptan (IMITREX) 100 MG tablet Take 1 tablet (100 mg total) by mouth every 2 (two) hours as needed for migraine. May repeat in 2 hours if headache persists or recurs. 10/15/20  Yes Sowles, Drue Stager, MD  traZODone (DESYREL) 50 MG tablet Take 0.5-1 tablets (25-50 mg total) by mouth at bedtime as needed for sleep. 09/15/21  Yes Sowles, Drue Stager, MD  Ubrogepant (UBRELVY) 100 MG TABS Take 1 tablet by mouth daily as needed. For migraine headaches 09/15/21  Yes Sowles, Drue Stager, MD  triamcinolone (KENALOG) 0.1 % Apply 1 application topically 2 (two) times daily. 01/20/21   Steele Sizer, MD    Family History Family History  Problem Relation Age of Onset   Diabetes Mother    Hypertension Mother    Cancer Father 76       Mouth   COPD Father    Hypertension Brother    Diabetes Brother    Breast cancer Neg Hx     Social History Social History   Tobacco Use   Smoking status: Never   Smokeless tobacco: Never   Tobacco comments:    smoking cessation materials not required  Vaping Use   Vaping Use: Never used  Substance Use Topics   Alcohol use: No   Drug use: No     Allergies   Penicillins   Review of Systems Review of Systems see HPI   Physical Exam Triage Vital Signs ED Triage Vitals  Enc Vitals Group     BP 10/20/21 0913 (!) 152/109     Pulse Rate 10/20/21 0913 86     Resp 10/20/21 0913 18     Temp 10/20/21 0913 98.8 F (37.1 C)     Temp Source 10/20/21 0913 Oral     SpO2 10/20/21 0913 99 %     Weight 10/20/21 0910 167 lb 1.7 oz (75.8 kg)     Height 10/20/21 0910 5\' 2"  (1.575 m)     Pain Score 10/20/21 0910 5     Pain Loc --    Updated Vital Signs BP (!) 156/114 (BP Location: Left Arm)   Pulse 78   Temp 98.8 F (37.1 C) (Oral)   Resp 18   Ht 5\' 2"  (1.575 m)   Wt 75.8 kg   LMP 09/29/2021 (Approximate)   SpO2 99%   BMI 30.56 kg/m   Physical Exam Constitutional:      General:  She is not in acute distress.    Appearance: She is ill-appearing. She is not toxic-appearing.     Comments:  Nicely groomed  HENT:     Head: Atraumatic.     Comments: Bilateral TMs without erythema Moderately severe nasal congestion bilaterally Posterior pharynx with moderate injection and postnasal discharge evident Eyes:     Extraocular Movements: Extraocular movements intact.  Conjunctiva/sclera:     Right eye: Right conjunctiva is not injected. No exudate.    Left eye: Left conjunctiva is not injected. No exudate.    Comments: Eyes watering  Cardiovascular:     Rate and Rhythm: Normal rate and regular rhythm.  Pulmonary:     Effort: Pulmonary effort is normal. No respiratory distress.     Breath sounds: No wheezing or rhonchi.  Abdominal:     General: There is no distension.  Musculoskeletal:     Cervical back: Neck supple.  Skin:    General: Skin is warm and dry.     Coloration: Skin is not cyanotic.  Neurological:     Mental Status: She is alert.     Comments: Walked into the urgent care independently, able to climb on/off the exam table without assistance Speech is clear/coherent, logical     UC Treatments / Results  Labs (all labs ordered are listed, but only abnormal results are displayed) Labs Reviewed  SARS CORONAVIRUS 2 (TAT 6-24 HRS)  Result negative  Radiology No results found. No imaging indicated/done at UC visit  Medications Ordered in UC Medications - No data to display No meds given at [facility] visit   Final Clinical Impressions(s) / UC Diagnoses   Final diagnoses:  Influenza-like illness     Discharge Instructions      Symptoms today seem most consistent with an influenza like illness.  Prescriptions for oseltamivir (for flu), benzonatate (for cough), and prednisone (couple doses for head congestion).  A covid test is pending, staff will contact you if the results requires a change in treatment.  Push fluids and rest.  Eat fruits and  vegetables to help your immune system do its best work.  Recheck for new fever >100.5, increasing phlegm production/nasal discharge, or if not starting to improve in a few days.  Note for work--return to work 10/22/21.    ED Prescriptions     Medication Sig Dispense Auth. Provider   predniSONE (DELTASONE) 50 MG tablet Take 1 tablet (50 mg total) by mouth daily. 3 tablet Wynona Luna, MD   benzonatate (TESSALON) 200 MG capsule Take 1 capsule (200 mg total) by mouth 3 (three) times daily as needed for cough. 30 capsule Wynona Luna, MD   oseltamivir (TAMIFLU) 75 MG capsule Take 1 capsule (75 mg total) by mouth every 12 (twelve) hours. 10 capsule Wynona Luna, MD      PDMP not reviewed this encounter.   Wynona Luna, MD 10/21/21 [phone]

## 2021-10-20 NOTE — ED Triage Notes (Signed)
Pt c/o cough, sore throat, headache. Started about 4 days ago. She states she has green sputum and the cough is keeping her up at night. Denies fever.

## 2021-10-20 NOTE — Discharge Instructions (Addendum)
Symptoms today seem most consistent with an influenza like illness.  Prescriptions for oseltamivir (for flu), benzonatate (for cough), and prednisone (couple doses for head congestion).  A covid test is pending, staff will contact you if the results requires a change in treatment.  Push fluids and rest.  Eat fruits and vegetables to help your immune system do its best work.  Recheck for new fever >100.5, increasing phlegm production/nasal discharge, or if not starting to improve in a few days.  Note for work--return to work 10/22/21.

## 2021-10-21 DIAGNOSIS — G35 Multiple sclerosis: Secondary | ICD-10-CM | POA: Diagnosis not present

## 2021-10-21 MED ORDER — PLEGRIDY 125 MCG/0.5 ML SUBCUTANEOUS PEN INJECTOR
SUBCUTANEOUS | 4 refills | 28 days | Status: CP
Start: 2021-10-21 — End: ?
  Filled 2021-10-30: qty 1, 28d supply, fill #0

## 2021-10-22 DIAGNOSIS — G35 Multiple sclerosis: Secondary | ICD-10-CM | POA: Diagnosis not present

## 2021-10-23 DIAGNOSIS — G35 Multiple sclerosis: Secondary | ICD-10-CM | POA: Diagnosis not present

## 2021-10-24 DIAGNOSIS — G35 Multiple sclerosis: Secondary | ICD-10-CM | POA: Diagnosis not present

## 2021-10-25 DIAGNOSIS — G35 Multiple sclerosis: Secondary | ICD-10-CM | POA: Diagnosis not present

## 2021-10-26 DIAGNOSIS — G35 Multiple sclerosis: Secondary | ICD-10-CM | POA: Diagnosis not present

## 2021-10-27 DIAGNOSIS — G35 Multiple sclerosis: Secondary | ICD-10-CM | POA: Diagnosis not present

## 2021-10-28 DIAGNOSIS — G35 Multiple sclerosis: Secondary | ICD-10-CM | POA: Diagnosis not present

## 2021-10-29 DIAGNOSIS — G35 Multiple sclerosis: Secondary | ICD-10-CM | POA: Diagnosis not present

## 2021-10-30 DIAGNOSIS — G35 Multiple sclerosis: Secondary | ICD-10-CM | POA: Diagnosis not present

## 2021-10-31 DIAGNOSIS — G35 Multiple sclerosis: Secondary | ICD-10-CM | POA: Diagnosis not present

## 2021-11-01 DIAGNOSIS — G35 Multiple sclerosis: Secondary | ICD-10-CM | POA: Diagnosis not present

## 2021-11-02 DIAGNOSIS — G35 Multiple sclerosis: Secondary | ICD-10-CM | POA: Diagnosis not present

## 2021-11-03 DIAGNOSIS — G35 Multiple sclerosis: Secondary | ICD-10-CM | POA: Diagnosis not present

## 2021-11-04 DIAGNOSIS — G35 Multiple sclerosis: Secondary | ICD-10-CM | POA: Diagnosis not present

## 2021-11-05 DIAGNOSIS — G35 Multiple sclerosis: Secondary | ICD-10-CM | POA: Diagnosis not present

## 2021-11-06 DIAGNOSIS — G35 Multiple sclerosis: Secondary | ICD-10-CM | POA: Diagnosis not present

## 2021-11-07 DIAGNOSIS — G35 Multiple sclerosis: Secondary | ICD-10-CM | POA: Diagnosis not present

## 2021-11-08 DIAGNOSIS — G35 Multiple sclerosis: Secondary | ICD-10-CM | POA: Diagnosis not present

## 2021-11-09 DIAGNOSIS — G35 Multiple sclerosis: Secondary | ICD-10-CM | POA: Diagnosis not present

## 2021-11-10 DIAGNOSIS — G35 Multiple sclerosis: Secondary | ICD-10-CM | POA: Diagnosis not present

## 2021-11-11 DIAGNOSIS — G35 Multiple sclerosis: Secondary | ICD-10-CM | POA: Diagnosis not present

## 2021-11-12 DIAGNOSIS — G35 Multiple sclerosis: Secondary | ICD-10-CM | POA: Diagnosis not present

## 2021-11-13 DIAGNOSIS — G35 Multiple sclerosis: Secondary | ICD-10-CM | POA: Diagnosis not present

## 2021-11-14 DIAGNOSIS — G35 Multiple sclerosis: Secondary | ICD-10-CM | POA: Diagnosis not present

## 2021-11-15 DIAGNOSIS — G35 Multiple sclerosis: Secondary | ICD-10-CM | POA: Diagnosis not present

## 2021-11-16 DIAGNOSIS — G35 Multiple sclerosis: Secondary | ICD-10-CM | POA: Diagnosis not present

## 2021-11-17 DIAGNOSIS — G35 Multiple sclerosis: Secondary | ICD-10-CM | POA: Diagnosis not present

## 2021-11-18 DIAGNOSIS — G35 Multiple sclerosis: Secondary | ICD-10-CM | POA: Diagnosis not present

## 2021-11-19 DIAGNOSIS — G35 Multiple sclerosis: Secondary | ICD-10-CM | POA: Diagnosis not present

## 2021-11-19 MED FILL — PLEGRIDY 125 MCG/0.5 ML SUBCUTANEOUS PEN INJECTOR: SUBCUTANEOUS | 28 days supply | Qty: 1 | Fill #1

## 2021-11-20 DIAGNOSIS — G35 Multiple sclerosis: Secondary | ICD-10-CM | POA: Diagnosis not present

## 2021-11-21 DIAGNOSIS — G35 Multiple sclerosis: Secondary | ICD-10-CM | POA: Diagnosis not present

## 2021-11-22 DIAGNOSIS — G35 Multiple sclerosis: Secondary | ICD-10-CM | POA: Diagnosis not present

## 2021-11-23 DIAGNOSIS — G35 Multiple sclerosis: Secondary | ICD-10-CM | POA: Diagnosis not present

## 2021-11-24 DIAGNOSIS — G35 Multiple sclerosis: Secondary | ICD-10-CM | POA: Diagnosis not present

## 2021-11-25 DIAGNOSIS — G35 Multiple sclerosis: Secondary | ICD-10-CM | POA: Diagnosis not present

## 2021-11-26 DIAGNOSIS — G35 Multiple sclerosis: Secondary | ICD-10-CM | POA: Diagnosis not present

## 2021-11-27 DIAGNOSIS — G35 Multiple sclerosis: Secondary | ICD-10-CM | POA: Diagnosis not present

## 2021-11-28 DIAGNOSIS — G35 Multiple sclerosis: Secondary | ICD-10-CM | POA: Diagnosis not present

## 2021-12-04 ENCOUNTER — Other Ambulatory Visit: Payer: Self-pay | Admitting: Family Medicine

## 2021-12-04 DIAGNOSIS — R7303 Prediabetes: Secondary | ICD-10-CM

## 2021-12-04 NOTE — Telephone Encounter (Signed)
Requested Prescriptions  Pending Prescriptions Disp Refills   OZEMPIC, 0.25 OR 0.5 MG/DOSE, 2 MG/1.5ML SOPN [Pharmacy Med Name: OZEMPIC 0.25-0.5 MG/DOSE PEN] 4.5 mL 0    Sig: INJECT 0.5 MG INTO THE SKIN ONCE A WEEK.     Endocrinology:  Diabetes - GLP-1 Receptor Agonists Passed - 12/04/2021  1:41 AM      Passed - HBA1C is between 0 and 7.9 and within 180 days    Hgb A1c MFr Bld  Date Value Ref Range Status  06/11/2021 5.6 <5.7 % of total Hgb Final    Comment:    For the purpose of screening for the presence of diabetes: . <5.7%       Consistent with the absence of diabetes 5.7-6.4%    Consistent with increased risk for diabetes             (prediabetes) > or =6.5%  Consistent with diabetes . This assay result is consistent with a decreased risk of diabetes. . Currently, no consensus exists regarding use of hemoglobin A1c for diagnosis of diabetes in children. . According to American Diabetes Association (ADA) guidelines, hemoglobin A1c <7.0% represents optimal control in non-pregnant diabetic patients. Different metrics may apply to specific patient populations.  Standards of Medical Care in Diabetes(ADA). Renella Cunas - Valid encounter within last 6 months    Recent Outpatient Visits          2 months ago GERD without esophagitis   [facility] Steele Sizer, MD   5 months ago Hypertension, benign   [facility] Steele Sizer, MD   10 months ago Other neutropenia [facility])   [facility] Steele Sizer, MD   1 year ago Other neutropenia [facility])   [facility] Steele Sizer, MD   1 year ago Other neutropenia [facility])   [facility] Steele Sizer, MD      Future Appointments            In 1 week Steele Sizer, MD [facility], [place]   In 2 weeks  [facility], [facility]

## 2021-12-06 ENCOUNTER — Other Ambulatory Visit: Payer: Self-pay | Admitting: Family Medicine

## 2021-12-06 DIAGNOSIS — G4701 Insomnia due to medical condition: Secondary | ICD-10-CM

## 2021-12-06 NOTE — Telephone Encounter (Signed)
Requested Prescriptions  Pending Prescriptions Disp Refills   traZODone (DESYREL) 50 MG tablet [Pharmacy Med Name: TRAZODONE 50 MG TABLET] 90 tablet 0    Sig: TAKE 1/2 TO 1 TABLET BY MOUTH AT BEDTIME AS NEEDED FOR SLEEP.     Psychiatry: Antidepressants - Serotonin Modulator Passed - 12/06/2021  2:32 PM      Passed - Completed PHQ-2 or PHQ-9 in the last 360 days      Passed - Valid encounter within last 6 months    Recent Outpatient Visits          2 months ago GERD without esophagitis   [facility] Steele Sizer, MD   5 months ago Hypertension, benign   [facility] Steele Sizer, MD   10 months ago Other neutropenia [facility])   [facility] Steele Sizer, MD   1 year ago Other neutropenia [facility])   [facility] Steele Sizer, MD   1 year ago Other neutropenia [facility])   [facility] Steele Sizer, MD      Future Appointments            In 1 week Steele Sizer, MD [facility], [place]   In 2 weeks  [facility], [facility]

## 2021-12-07 ENCOUNTER — Other Ambulatory Visit: Payer: Self-pay

## 2021-12-16 NOTE — Progress Notes (Signed)
Name: Tammie Lopez   MRN: [id number]    DOB: 01/20/1971   Date:12/17/2021       Progress Note  Subjective  Chief Complaint  Follow Up  HPI  HTN: BP is at goal. She has been compliant with medications now. She denies chest pain, dizziness or palpitation    MS: she has been off Tacfidera due to leucopenia, she goes to [facility] neurology, she is taking Plegridy, still has headaches for 3 days after injections. She has lesions on thoracic spine. She states she does not like the new medication because has spots on injection sites,. She is continues to have LUQ pain and rib cage discomfort, explained it may be from MS, we will try Lyrica to see if symptoms improves. Already seen by GI   Low back pain: going on for past couple of years, improves with meloxicam pain is aching like and constant, can radiate to pelvic area but not her legs. She has been seeing Dr. Joyce Copa - chiropractor and seems to help with symptoms.   Migraine: she states episodes are less frequent, usually after she takes her MS medication - usually 3 days after ( shots every 2 weeks ) seems to be triggered by strong scents, lack of sleep .  She states headache is described as pounding  behind her eyes ( usually unilateral) , associated with phonophobia and photophobia.She states Imitrex causes side effects  ( nausea and does not make it go away) she is on Ubrelvy . Advised to take Roselyn Meier every other day for 3 days starting the day she gest MS therapy   Obesity: history of insulin resistance, took Trulicity, Ozempic  and Metformin in the past , she states she has been walking, eating healthier, she is back on Ozempic since 08/2021 and weight is down 7 lbs. She states she occasionally forgets to take medication on time  Pre-diabetes: last A1C improved, but used to be elevated at 5.7 % , she also has HTN and increase in abdominal girth, she has been taking Ozempic and denies side effects, she states pain on LUQ started prior to staring  Ozempic   Insomnia: she would like to go back on Trazodone, she has some old pills at home   Eczema: doing well at this time . Unchanged    Chronic constipation/GERD: she is always feeling bloated, takes Linzess occasionally, had colonoscopy done by Dr. Allen Norris She has been having early satiety and LUQ pain going on for months.GI said likely muscular, she continues to have daily pain, dull and aching, worse when she eats and does not have a bowel movement  , better when she applies pressure. She also noticed improvement when she takes Linzess and has a bowel movement. She feels like it is rib cage related   AR: she has recurrent episodes of nasal congestion and facial pressure. 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 07/16/2020   Low TSH level 09/19/2019   Obesity (BMI 30-39.9) 12/06/2017   Insulin resistance 09/27/2017   Essential hypertension 09/21/2017   Right sided sciatica 09/21/2017   Major depression, recurrent ([place]) 05/24/2017   Migraine without aura, not intractable 12/09/2016   Nickel allergy 12/09/2016   GERD without esophagitis 12/09/2016   Other neutropenia ([place]) 12/09/2016   Dyslipidemia 12/09/2016  Diverticulosis 12/09/2016   Proctitis 12/09/2016   Allergic rhinitis, seasonal 12/09/2016   Chronic constipation 12/09/2016   Vitamin D deficiency 08/06/2013   MS (multiple sclerosis) ([place]) 04/17/2013    Past Surgical History:  Procedure Laterality Date   BREAST BIOPSY Left 01/06/2018    2 areas  neg   BUNIONECTOMY     CESAREAN SECTION N/A 2006   COLONOSCOPY WITH PROPOFOL N/A 02/09/2021   Procedure: COLONOSCOPY WITH PROPOFOL;  Surgeon: [name], MD;  Location: [place];  Service: Endoscopy;  Laterality: N/A;   GANGLION CYST EXCISION Bilateral    Hands    POLYPECTOMY N/A 02/09/2021   Procedure: POLYPECTOMY;  Surgeon: [name], MD;  Location: [place];  Service: Endoscopy;  Laterality: N/A;    Family History  Problem Relation Age of Onset   Diabetes Mother    Hypertension Mother    Cancer Father 44       Mouth   COPD Father    Hypertension Brother    Diabetes Brother    Breast cancer Neg Hx     Social History   Tobacco Use   Smoking status: Never   Smokeless tobacco: Never   Tobacco comments:    smoking cessation materials not required  Substance Use Topics   Alcohol use: No     Current Outpatient Medications:    amLODipine (NORVASC) 2.5 MG tablet, Take 1 tablet (2.5 mg total) by mouth daily., Disp: 90 tablet, Rfl: 1   Cholecalciferol (VITAMIN D) 50 MCG (2000 UT) CAPS, Take 50,000 Units by mouth once a week., Disp: , Rfl:    linaclotide (LINZESS) 290 MCG CAPS capsule, TAKE 1 CAPSULE (290 MCG TOTAL) BY MOUTH DAILY., Disp: 30 capsule, Rfl: 11   meloxicam (MOBIC) 15 MG tablet, Take 1 tablet (15 mg total) by mouth daily., Disp: 90 tablet, Rfl: 0   omeprazole (PRILOSEC) 40 MG capsule, TAKE 1 CAPSULE BY MOUTH EVERY DAY, Disp: 90 capsule, Rfl: 1   OZEMPIC, 0.25 OR 0.5 MG/DOSE, 2 MG/1.5ML SOPN, INJECT 0.5 MG INTO THE SKIN ONCE A WEEK., Disp: 4.5 mL, Rfl: 0   Peginterferon Beta-1a (PLEGRIDY) 125 MCG/0.5ML SOPN, Inject into the skin. Inject the contents of 1 pen (125 mcg) under the skin every fourteen (14) days., Disp: , Rfl:    progesterone (PROMETRIUM) 100 MG capsule, Take 100 mg by mouth at bedtime., Disp: , Rfl:    traZODone (DESYREL) 50 MG tablet, TAKE 1/2 TO 1 TABLET BY MOUTH AT BEDTIME AS NEEDED FOR SLEEP., Disp: 90 tablet, Rfl: 0   triamcinolone (KENALOG) 0.1 %, Apply 1 application topically 2 (two) times daily., Disp: 453.6 g, Rfl: 0   Ubrogepant (UBRELVY) 100 MG TABS, Take 1 tablet by mouth daily as needed. For migraine headaches, Disp: 16 tablet, Rfl: 1   benzonatate (TESSALON) 200 MG capsule, Take 1 capsule  (200 mg total) by mouth 3 (three) times daily as needed for cough. (Patient not taking: Reported on 12/17/2021), Disp: 30 capsule, Rfl: 1   oseltamivir (TAMIFLU) 75 MG capsule, Take 1 capsule (75 mg total) by mouth every 12 (twelve) hours. (Patient not taking: Reported on 12/17/2021), Disp: 10 capsule, Rfl: 0   predniSONE (DELTASONE) 50 MG tablet, Take 1 tablet (50 mg total) by mouth daily. (Patient not taking: Reported on 12/17/2021), Disp: 3 tablet, Rfl: 0   SUMAtriptan (IMITREX) 100 MG tablet, Take 1 tablet (100 mg total) by mouth every 2 (two) hours as needed for migraine. May repeat in 2 hours if headache persists  or recurs. (Patient not taking: Reported on 12/17/2021), Disp: 9 tablet, Rfl: 1  Allergies  Allergen Reactions   Penicillins Hives    I personally reviewed active problem list, medication list, allergies, family history, social history, health maintenance with the patient/caregiver today.   ROS  Constitutional: Negative for fever , positive for weight change.  Respiratory: Negative for cough and shortness of breath.   Cardiovascular: Negative for chest pain or palpitations.  Gastrointestinal: Negative for abdominal pain, no bowel changes.  Musculoskeletal: Negative for gait problem or joint swelling.  Skin: Negative for rash.  Neurological: Negative for dizziness or headache.  No other specific complaints in a complete review of systems (except as listed in HPI above).   Objective  Vitals:   12/17/21 0935  BP: 124/80  Pulse: 81  Resp: 16  SpO2: 98%  Weight: 160 lb (72.6 kg)  Height: 5\' 2"  (1.575 m)    Body mass index is 29.26 kg/m.  Physical Exam  Constitutional: Patient appears well-developed and well-nourished.  No distress.  HEENT: head atraumatic, normocephalic, pupils equal and reactive to light, neck supple Cardiovascular: Normal rate, regular rhythm and normal heart sounds.  No murmur heard. No BLE edema. Pulmonary/Chest: Effort normal and breath sounds  normal. No respiratory distress. Abdominal: Soft.  There is no tenderness. Negative CVA Muscular Skeletal: pain during palpation of left flank and lower left ribs Psychiatric: Patient has a normal mood and affect. behavior is normal. Judgment and thought content normal.   Recent Results (from the past 2160 hour(s))  SARS CORONAVIRUS 2 (TAT 6-24 HRS) Nasopharyngeal Nasopharyngeal Swab     Status: None   Collection Time: 10/20/21  9:14 AM   Specimen: Nasopharyngeal Swab  Result Value Ref Range   SARS Coronavirus 2 NEGATIVE NEGATIVE    Comment: (NOTE) SARS-CoV-2 target nucleic acids are NOT DETECTED.  The SARS-CoV-2 RNA is generally detectable in upper and lower respiratory specimens during the acute phase of infection. Negative results do not preclude SARS-CoV-2 infection, do not rule out co-infections with other pathogens, and should not be used as the sole basis for treatment or other patient management decisions. Negative results must be combined with clinical observations, patient history, and epidemiological information. The expected result is Negative.  Fact Sheet for Patients: SugarRoll.be  Fact Sheet for Healthcare Providers: https://www.woods-mathews.com/  This test is not yet approved or cleared by the [place] FDA and  has been authorized for detection and/or diagnosis of SARS-CoV-2 by FDA under an Emergency Use Authorization (EUA). This EUA will remain  in effect (meaning this test can be used) for the duration of the COVID-19 declaration under Se ction 564(b)(1) of the Act, 21 U.S.C. section 360bbb-3(b)(1), unless the authorization is terminated or revoked sooner.  Performed at [facility], [place] [address], [place], [place] [address]     PHQ2/9: Depression screen [facility] 2/9 12/17/2021 09/15/2021 06/11/2021 01/20/2021 12/18/2020  Decreased Interest 0 0 0 0 0  Down, Depressed, Hopeless 0 1 0 0 0  PHQ - 2 Score 0 1 0 0  0  Altered sleeping 3 1 1  - -  Tired, decreased energy 3 0 1 - -  Change in appetite 3 0 3 - -  Feeling bad or failure about yourself  0 0 0 - -  Trouble concentrating 0 0 0 - -  Moving slowly or fidgety/restless 0 0 0 - -  Suicidal thoughts 0 0 0 - -  PHQ-9 Score 9 2 5  - -  Difficult  doing work/chores - - - - -  Some recent data might be hidden    phq 9 is positive   Fall Risk: Fall Risk  12/17/2021 09/15/2021 06/11/2021 01/20/2021 12/18/2020  Falls in the past year? 0 0 0 0 0  Number falls in past yr: 0 0 0 0 0  Injury with Fall? 0 0 0 0 0  Risk for fall due to : - No Fall Risks - - No Fall Risks  Follow up Falls prevention discussed Falls prevention discussed - - Falls prevention discussed      Functional Status Survey: Is the patient deaf or have difficulty hearing?: No Does the patient have difficulty seeing, even when wearing glasses/contacts?: No Does the patient have difficulty concentrating, remembering, or making decisions?: No Does the patient have difficulty walking or climbing stairs?: Yes Does the patient have difficulty dressing or bathing?: No Does the patient have difficulty doing errands alone such as visiting a doctor's office or shopping?: No    Assessment & Plan  1. Migraine without aura and without status migrainosus, not intractable  Discussed how to take ubrelvy to prevent symptoms  2. MS (multiple sclerosis) (HCC)  Stable, compliant with medications now   3. GERD without esophagitis  - omeprazole (PRILOSEC) 40 MG capsule; TAKE 1 CAPSULE BY MOUTH EVERY DAY  Dispense: 90 capsule; Refill: 1  4. Hypertension, benign  - amLODipine (NORVASC) 2.5 MG tablet; Take 1 tablet (2.5 mg total) by mouth daily.  Dispense: 90 tablet; Refill: 1  5. Major depression in remission [facility])  She is now worried about her health  6. Vitamin D deficiency disease   7. Dyslipidemia   8. Pre-diabetes   9. Chronic bilateral low back pain without sciatica  -  meloxicam (MOBIC) 15 MG tablet; Take 1 tablet (15 mg total) by mouth daily.  Dispense: 90 tablet; Refill: 0  10. Rib pain on left side  - pregabalin (LYRICA) 50 MG capsule; Take 1-3 capsules (50-150 mg total) by mouth 3 (three) times daily.  Dispense: 90 capsule; Refill: 0 - DG Ribs Unilateral Left; Future  11. Abdominal pain, left upper quadrant  Evaluated by GI  12. Thoracic radiculitis  Possibly the cause of LUQ pain and also rib cage pain - pregabalin (LYRICA) 50 MG capsule; Take 1-3 capsules (50-150 mg total) by mouth 3 (three) times daily.  Dispense: 90 capsule; Refill: 0

## 2021-12-17 ENCOUNTER — Encounter: Payer: Self-pay | Admitting: Family Medicine

## 2021-12-17 ENCOUNTER — Other Ambulatory Visit: Payer: Self-pay

## 2021-12-17 ENCOUNTER — Ambulatory Visit
Admission: RE | Admit: 2021-12-17 | Discharge: 2021-12-17 | Disposition: A | Discharge status: Home / Self Care | Payer: Medicare Other | Source: Ambulatory Visit | Attending: Family Medicine | Admitting: Family Medicine

## 2021-12-17 ENCOUNTER — Ambulatory Visit (INDEPENDENT_AMBULATORY_CARE_PROVIDER_SITE_OTHER): Payer: Medicare Other | Admitting: Family Medicine

## 2021-12-17 ENCOUNTER — Ambulatory Visit
Admission: RE | Admit: 2021-12-17 | Discharge: 2021-12-17 | Disposition: A | Discharge status: Home / Self Care | Payer: Medicare Other | Attending: Family Medicine | Admitting: Family Medicine

## 2021-12-17 VITALS — BP 124/80 | HR 81 | Resp 16 | Ht 62.0 in | Wt 160.0 lb

## 2021-12-17 DIAGNOSIS — I10 Essential (primary) hypertension: Secondary | ICD-10-CM | POA: Diagnosis not present

## 2021-12-17 DIAGNOSIS — R0781 Pleurodynia: Secondary | ICD-10-CM

## 2021-12-17 DIAGNOSIS — F325 Major depressive disorder, single episode, in full remission: Secondary | ICD-10-CM

## 2021-12-17 DIAGNOSIS — M545 Low back pain, unspecified: Secondary | ICD-10-CM

## 2021-12-17 DIAGNOSIS — G8929 Other chronic pain: Secondary | ICD-10-CM

## 2021-12-17 DIAGNOSIS — G43009 Migraine without aura, not intractable, without status migrainosus: Secondary | ICD-10-CM

## 2021-12-17 DIAGNOSIS — K219 Gastro-esophageal reflux disease without esophagitis: Secondary | ICD-10-CM | POA: Diagnosis not present

## 2021-12-17 DIAGNOSIS — E559 Vitamin D deficiency, unspecified: Secondary | ICD-10-CM | POA: Diagnosis not present

## 2021-12-17 DIAGNOSIS — R7303 Prediabetes: Secondary | ICD-10-CM | POA: Diagnosis not present

## 2021-12-17 DIAGNOSIS — E785 Hyperlipidemia, unspecified: Secondary | ICD-10-CM | POA: Diagnosis not present

## 2021-12-17 DIAGNOSIS — M5414 Radiculopathy, thoracic region: Secondary | ICD-10-CM

## 2021-12-17 DIAGNOSIS — G35 Multiple sclerosis: Secondary | ICD-10-CM

## 2021-12-17 DIAGNOSIS — R1012 Left upper quadrant pain: Secondary | ICD-10-CM

## 2021-12-17 MED ORDER — OMEPRAZOLE 40 MG PO CPDR: DELAYED_RELEASE_CAPSULE | ORAL | 1 refills | Status: DC

## 2021-12-17 MED ORDER — MELOXICAM 15 MG PO TABS: 15.0000 mg | ORAL_TABLET | Freq: Every day | ORAL | 0 refills | Status: DC

## 2021-12-17 MED ORDER — AMLODIPINE BESYLATE 2.5 MG PO TABS: 2.5000 mg | ORAL_TABLET | Freq: Every day | ORAL | 1 refills | Status: DC

## 2021-12-17 MED ORDER — PREGABALIN 50 MG PO CAPS
50.0000 mg | ORAL_CAPSULE | Freq: Three times a day (TID) | ORAL | 0 refills | Status: DC
Start: 2021-12-17 — End: 2023-04-12

## 2021-12-22 ENCOUNTER — Ambulatory Visit: Payer: Self-pay

## 2021-12-23 MED FILL — PLEGRIDY 125 MCG/0.5 ML SUBCUTANEOUS PEN INJECTOR: SUBCUTANEOUS | 28 days supply | Qty: 1 | Fill #2

## 2022-01-20 MED FILL — PLEGRIDY 125 MCG/0.5 ML SUBCUTANEOUS PEN INJECTOR: SUBCUTANEOUS | 28 days supply | Qty: 1 | Fill #3

## 2022-01-26 NOTE — Progress Notes (Signed)
Name: Tammie Lopez   MRN: [id number]    DOB: 05-20-1971   Date:01/27/2022       Progress Note  Subjective  Chief Complaint  Follow up   HPI  Low back pain: going on for past couple of years, improves with meloxicam pain is aching like and constant, can radiate to pelvic area but not her legs. She has been seeing Dr. Joyce Copa - chiropractor , discussed PT .   Obesity: she has insulin resistance, took Trulicity,  and Metformin in the past , currently on Ozempic 0.5 mg since October 2022 we will try to increase dose to 1 mg today since she initially lost 7 lbs but no weight loss in over one month. She states she has been walking, eating healthier, avoiding sodas   Pre-diabetes: last A1C improved, but used to be elevated at 5.7 % , she also has HTN and increase in abdominal girth, she has been taking Ozempic and denies side effects, she states pain on LUQ started prior to staring Ozempic . We will adjust dose of Ozempic today    Depression: she is frustrated about inability to lose weight, we will go up on dose of Ozempic today. She is also very concerned about abdominal fold, getting married next year and states not feeling good about her body image and does not want to have sex. She states she has a friend that had abdominal panniculectomy and she would like to go see Psychiatric nurse in [place], Dr. Claudia Desanctis. She will make her own appointment and come back if she needs a referral.   LUQ pain: seen by Dr. Allen Norris last September she was given Linzess but states cannot take it daily because it causes diarrhea. We will try switching to Trulance. She continues to have bloating and distention of left upper quadrant that is worse after meals, causes discomfort. It improves with a bowel movement. 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 07/16/2020   Low TSH level 09/19/2019   Obesity (BMI 30-39.9) 12/06/2017   Insulin resistance 09/27/2017   Essential hypertension 09/21/2017   Right sided sciatica 09/21/2017   Major depression, recurrent ([place]) 05/24/2017   Migraine without aura, not intractable 12/09/2016   Nickel allergy 12/09/2016   GERD without esophagitis 12/09/2016   Other neutropenia ([place]) 12/09/2016   Dyslipidemia 12/09/2016   Diverticulosis 12/09/2016   Proctitis 12/09/2016   Allergic rhinitis, seasonal 12/09/2016   Chronic constipation 12/09/2016   Vitamin D deficiency 08/06/2013   MS (multiple sclerosis) ([place]) 04/17/2013    Past Surgical History:  Procedure Laterality Date   BREAST BIOPSY Left 01/06/2018    2 areas  neg   BUNIONECTOMY     CESAREAN SECTION N/A 2006   COLONOSCOPY WITH PROPOFOL N/A 02/09/2021   Procedure: COLONOSCOPY WITH PROPOFOL;  Surgeon: [name], MD;  Location: [place];  Service: Endoscopy;  Laterality: N/A;   GANGLION CYST EXCISION Bilateral    Hands   POLYPECTOMY N/A 02/09/2021   Procedure: POLYPECTOMY;  Surgeon: [name], MD;  Location: [place];  Service: Endoscopy;  Laterality: N/A;    Family History  Problem Relation Age of Onset   Diabetes Mother    Hypertension Mother    Cancer Father 64  Mouth   COPD Father    Hypertension Brother    Diabetes Brother    Breast cancer Neg Hx     Social History   Tobacco Use   Smoking status: Never   Smokeless tobacco: Never   Tobacco comments:    smoking cessation materials not required  Substance Use Topics   Alcohol use: No     Current Outpatient Medications:    amLODipine (NORVASC) 2.5 MG tablet, Take 1 tablet (2.5 mg total) by mouth daily., Disp: 90 tablet, Rfl: 1   Cholecalciferol (VITAMIN D) 50 MCG (2000 UT) CAPS, Take 50,000 Units by mouth once a week., Disp: , Rfl:    linaclotide (LINZESS) 290 MCG CAPS capsule, TAKE 1 CAPSULE  (290 MCG TOTAL) BY MOUTH DAILY., Disp: 30 capsule, Rfl: 11   meloxicam (MOBIC) 15 MG tablet, Take 1 tablet (15 mg total) by mouth daily., Disp: 90 tablet, Rfl: 0   omeprazole (PRILOSEC) 40 MG capsule, TAKE 1 CAPSULE BY MOUTH EVERY DAY, Disp: 90 capsule, Rfl: 1   OZEMPIC, 0.25 OR 0.5 MG/DOSE, 2 MG/1.5ML SOPN, INJECT 0.5 MG INTO THE SKIN ONCE A WEEK., Disp: 4.5 mL, Rfl: 0   Peginterferon Beta-1a (PLEGRIDY) 125 MCG/0.5ML SOPN, Inject into the skin. Inject the contents of 1 pen (125 mcg) under the skin every fourteen (14) days., Disp: , Rfl:    pregabalin (LYRICA) 50 MG capsule, Take 1-3 capsules (50-150 mg total) by mouth 3 (three) times daily., Disp: 90 capsule, Rfl: 0   progesterone (PROMETRIUM) 100 MG capsule, Take 100 mg by mouth at bedtime., Disp: , Rfl:    traZODone (DESYREL) 50 MG tablet, TAKE 1/2 TO 1 TABLET BY MOUTH AT BEDTIME AS NEEDED FOR SLEEP., Disp: 90 tablet, Rfl: 0   triamcinolone (KENALOG) 0.1 %, Apply 1 application topically 2 (two) times daily., Disp: 453.6 g, Rfl: 0   Ubrogepant (UBRELVY) 100 MG TABS, Take 1 tablet by mouth daily as needed. For migraine headaches, Disp: 16 tablet, Rfl: 1  Allergies  Allergen Reactions   Penicillins Hives    I personally reviewed active problem list, medication list, allergies, family history with the patient/caregiver today.   ROS  Ten systems reviewed and is negative except as mentioned in HPI   Objective  Vitals:   01/27/22 1056  BP: 134/88  Pulse: 86  Resp: 16  Temp: 98.1 F (36.7 C)  Weight: 160 lb (72.6 kg)  Height: 5\' 2"  (1.575 m)    Body mass index is 29.26 kg/m.  Physical Exam  Constitutional: Patient appears well-developed and well-nourished.  No distress.  HEENT: head atraumatic, normocephalic, pupils equal and reactive to light,  neck supple Cardiovascular: Normal rate, regular rhythm and normal heart sounds.  No murmur heard. No BLE edema. Pulmonary/Chest: Effort normal and breath sounds normal. No respiratory  distress. Abdominal: Soft.  There is left upper quadrant tenderness, normal bowel sounds, no guarding or rebound tenderness .Marland Kitchen Psychiatric: Patient has a normal mood and affect. behavior is normal. Judgment and thought content normal.    PHQ2/9: Depression screen [facility] 2/9 01/27/2022 12/17/2021 09/15/2021 06/11/2021 01/20/2021  Decreased Interest 1 0 0 0 0  Down, Depressed, Hopeless 1 0 1 0 0  PHQ - 2 Score 2 0 1 0 0  Altered sleeping 1 3 1 1  -  Tired, decreased energy 1 3 0 1 -  Change in appetite 1 3 0 3 -  Feeling bad or failure about yourself  1 0 0 0 -  Trouble concentrating 1 0  0 0 -  Moving slowly or fidgety/restless 0 0 0 0 -  Suicidal thoughts 0 0 0 0 -  PHQ-9 Score 7 9 2 5  -  Difficult doing work/chores Somewhat difficult - - - -  Some recent data might be hidden    phq 9 is positive   Fall Risk: Fall Risk  01/27/2022 12/17/2021 09/15/2021 06/11/2021 01/20/2021  Falls in the past year? 0 0 0 0 0  Number falls in past yr: 0 0 0 0 0  Injury with Fall? 0 0 0 0 0  Risk for fall due to : - - No Fall Risks - -  Follow up - Falls prevention discussed Falls prevention discussed - -      Functional Status Survey: Is the patient deaf or have difficulty hearing?: No Does the patient have difficulty seeing, even when wearing glasses/contacts?: Yes Does the patient have difficulty concentrating, remembering, or making decisions?: No Does the patient have difficulty walking or climbing stairs?: Yes Does the patient have difficulty dressing or bathing?: No Does the patient have difficulty doing errands alone such as visiting a doctor's office or shopping?: No    Assessment & Plan  1. Pre-diabetes  - Semaglutide, 1 MG/DOSE, 4 MG/3ML SOPN; Inject 1 mg as directed once a week.  Dispense: 9 mL; Refill: 0  2. Breast cancer screening by mammogram  - MM Digital Screening; Future  3. Abdominal pain, left upper quadrant  - Ambulatory referral to Gastroenterology  4. Chronic  idiopathic constipation  - Plecanatide (TRULANCE) 3 MG TABS; Take 1 tablet by mouth daily at 12 noon. In place of linzess  Dispense: 90 tablet; Refill: 1  5. Chronic bilateral low back pain without sciatica  Keep chiropractor care and consider PT

## 2022-01-27 ENCOUNTER — Ambulatory Visit (INDEPENDENT_AMBULATORY_CARE_PROVIDER_SITE_OTHER): Payer: Medicare Other | Admitting: Family Medicine

## 2022-01-27 ENCOUNTER — Encounter: Payer: Self-pay | Admitting: Family Medicine

## 2022-01-27 ENCOUNTER — Other Ambulatory Visit: Payer: Self-pay

## 2022-01-27 VITALS — BP 134/88 | HR 86 | Temp 98.1°F | Resp 16 | Ht 62.0 in | Wt 160.0 lb

## 2022-01-27 DIAGNOSIS — R1012 Left upper quadrant pain: Secondary | ICD-10-CM

## 2022-01-27 DIAGNOSIS — M545 Low back pain, unspecified: Secondary | ICD-10-CM | POA: Diagnosis not present

## 2022-01-27 DIAGNOSIS — G8929 Other chronic pain: Secondary | ICD-10-CM

## 2022-01-27 DIAGNOSIS — K5904 Chronic idiopathic constipation: Secondary | ICD-10-CM | POA: Diagnosis not present

## 2022-01-27 DIAGNOSIS — R7303 Prediabetes: Secondary | ICD-10-CM

## 2022-01-27 DIAGNOSIS — Z1231 Encounter for screening mammogram for malignant neoplasm of breast: Secondary | ICD-10-CM

## 2022-01-27 MED ORDER — TRULANCE 3 MG PO TABS: 1.0000 | ORAL_TABLET | Freq: Every day | ORAL | 1 refills | Status: DC

## 2022-01-27 MED ORDER — SEMAGLUTIDE (1 MG/DOSE) 4 MG/3ML ~~LOC~~ SOPN: 1.0000 mg | PEN_INJECTOR | SUBCUTANEOUS | 0 refills | Status: DC

## 2022-01-29 DIAGNOSIS — G35 Multiple sclerosis: Principal | ICD-10-CM

## 2022-02-09 ENCOUNTER — Ambulatory Visit (INDEPENDENT_AMBULATORY_CARE_PROVIDER_SITE_OTHER): Payer: Medicare Other

## 2022-02-09 ENCOUNTER — Other Ambulatory Visit: Payer: Self-pay | Admitting: Family Medicine

## 2022-02-09 DIAGNOSIS — K5909 Other constipation: Secondary | ICD-10-CM

## 2022-02-09 DIAGNOSIS — Z0000 Encounter for general adult medical examination without abnormal findings: Secondary | ICD-10-CM

## 2022-02-09 DIAGNOSIS — R1012 Left upper quadrant pain: Secondary | ICD-10-CM

## 2022-02-09 NOTE — Patient Instructions (Signed)
Tammie Lopez , ?Thank you for taking time to come for your Medicare Wellness Visit. I appreciate your ongoing commitment to your health goals. Please review the following plan we discussed and let me know if I can assist you in the future.  ? ?Screening recommendations/referrals: ?Colonoscopy: done 02/09/21; repeat 01/2028 ?Mammogram: done 01/22/21. Please call [phone] to schedule your mammogram.  ?Bone Density: due age 50 ?Recommended yearly ophthalmology/optometry visit for glaucoma screening and checkup ?Recommended yearly dental visit for hygiene and checkup ? ?Vaccinations: ?Influenza vaccine: declined ?Pneumococcal vaccine: declined ?Tdap vaccine: done 01/29/13 ?Shingles vaccine: due age 3  ?Covid-19: done 04/17/20 & 05/08/20 ? ?Advanced directives: Advance directive discussed with you today. Even though you declined this today please call our office should you change your mind and we can give you the proper paperwork for you to fill out.  ? ?Conditions/risks identified: Recommend increasing physical activity as tolerated ? ?Next appointment: Follow up in one year for your annual wellness visit.  ? ?Preventive Care 40-64 Years, Female ?Preventive care refers to lifestyle choices and visits with your health care provider that can promote health and wellness. ?What does preventive care include? ?A yearly physical exam. This is also called an annual well check. ?Dental exams once or twice a year. ?Routine eye exams. Ask your health care provider how often you should have your eyes checked. ?Personal lifestyle choices, including: ?Daily care of your teeth and gums. ?Regular physical activity. ?Eating a healthy diet. ?Avoiding tobacco and drug use. ?Limiting alcohol use. ?Practicing safe sex. ?Taking low-dose aspirin daily starting at age 3. ?Taking vitamin and mineral supplements as recommended by your health care provider. ?What happens during an annual well check? ?The services and screenings done by your health  care provider during your annual well check will depend on your age, overall health, lifestyle risk factors, and family history of disease. ?Counseling  ?Your health care provider may ask you questions about your: ?Alcohol use. ?Tobacco use. ?Drug use. ?Emotional well-being. ?Home and relationship well-being. ?Sexual activity. ?Eating habits. ?Work and work Statistician. ?Method of birth control. ?Menstrual cycle. ?Pregnancy history. ?Screening  ?You may have the following tests or measurements: ?Height, weight, and BMI. ?Blood pressure. ?Lipid and cholesterol levels. These may be checked every 5 years, or more frequently if you are over 20 years old. ?Skin check. ?Lung cancer screening. You may have this screening every year starting at age 7 if you have a 30-pack-year history of smoking and currently smoke or have quit within the past 15 years. ?Fecal occult blood test (FOBT) of the stool. You may have this test every year starting at age 56. ?Flexible sigmoidoscopy or colonoscopy. You may have a sigmoidoscopy every 5 years or a colonoscopy every 10 years starting at age 51. ?Hepatitis C blood test. ?Hepatitis B blood test. ?Sexually transmitted disease (STD) testing. ?Diabetes screening. This is done by checking your blood sugar (glucose) after you have not eaten for a while (fasting). You may have this done every 1-3 years. ?Mammogram. This may be done every 1-2 years. Talk to your health care provider about when you should start having regular mammograms. This may depend on whether you have a family history of breast cancer. ?BRCA-related cancer screening. This may be done if you have a family history of breast, ovarian, tubal, or peritoneal cancers. ?Pelvic exam and Pap test. This may be done every 3 years starting at age 31. Starting at age 70, this may be done every 5 years if you have  a Pap test in combination with an HPV test. ?Bone density scan. This is done to screen for osteoporosis. You may have this  scan if you are at high risk for osteoporosis. ?Discuss your test results, treatment options, and if necessary, the need for more tests with your health care provider. ?Vaccines  ?Your health care provider may recommend certain vaccines, such as: ?Influenza vaccine. This is recommended every year. ?Tetanus, diphtheria, and acellular pertussis (Tdap, Td) vaccine. You may need a Td booster every 10 years. ?Zoster vaccine. You may need this after age 50. ?Pneumococcal 13-valent conjugate (PCV13) vaccine. You may need this if you have certain conditions and were not previously vaccinated. ?Pneumococcal polysaccharide (PPSV23) vaccine. You may need one or two doses if you smoke cigarettes or if you have certain conditions. ?Talk to your health care provider about which screenings and vaccines you need and how often you need them. ?This information is not intended to replace advice given to you by your health care provider. Make sure you discuss any questions you have with your health care provider. ?Document Released: 12/12/2015 Document Revised: 08/04/2016 Document Reviewed: 09/16/2015 ?Elsevier Interactive Patient Education ? 2017 [employer] ? ? ? ?Fall Prevention in the Home ?Falls can cause injuries. They can happen to people of all ages. There are many things you can do to make your home safe and to help prevent falls. ?What can I do on the outside of my home? ?Regularly fix the edges of walkways and driveways and fix any cracks. ?Remove anything that might make you trip as you walk through a door, such as a raised step or threshold. ?Trim any bushes or trees on the path to your home. ?Use bright outdoor lighting. ?Clear any walking paths of anything that might make someone trip, such as rocks or tools. ?Regularly check to see if handrails are loose or broken. Make sure that both sides of any steps have handrails. ?Any raised decks and porches should have guardrails on the edges. ?Have any leaves, snow, or ice  cleared regularly. ?Use sand or salt on walking paths during winter. ?Clean up any spills in your garage right away. This includes oil or grease spills. ?What can I do in the bathroom? ?Use night lights. ?Install grab bars by the toilet and in the tub and shower. Do not use towel bars as grab bars. ?Use non-skid mats or decals in the tub or shower. ?If you need to sit down in the shower, use a plastic, non-slip stool. ?Keep the floor dry. Clean up any water that spills on the floor as soon as it happens. ?Remove soap buildup in the tub or shower regularly. ?Attach bath mats securely with double-sided non-slip rug tape. ?Do not have throw rugs and other things on the floor that can make you trip. ?What can I do in the bedroom? ?Use night lights. ?Make sure that you have a light by your bed that is easy to reach. ?Do not use any sheets or blankets that are too big for your bed. They should not hang down onto the floor. ?Have a firm chair that has side arms. You can use this for support while you get dressed. ?Do not have throw rugs and other things on the floor that can make you trip. ?What can I do in the kitchen? ?Clean up any spills right away. ?Avoid walking on wet floors. ?Keep items that you use a lot in easy-to-reach places. ?If you need to reach something above you, use  a strong step stool that has a grab bar. ?Keep electrical cords out of the way. ?Do not use floor polish or wax that makes floors slippery. If you must use wax, use non-skid floor wax. ?Do not have throw rugs and other things on the floor that can make you trip. ?What can I do with my stairs? ?Do not leave any items on the stairs. ?Make sure that there are handrails on both sides of the stairs and use them. Fix handrails that are broken or loose. Make sure that handrails are as long as the stairways. ?Check any carpeting to make sure that it is firmly attached to the stairs. Fix any carpet that is loose or worn. ?Avoid having throw rugs at the  top or bottom of the stairs. If you do have throw rugs, attach them to the floor with carpet tape. ?Make sure that you have a light switch at the top of the stairs and the bottom of the stairs. If you do

## 2022-02-09 NOTE — Progress Notes (Signed)
? ?Subjective:  ? Tammie Lopez is a 51 y.o. female who presents for Medicare Annual (Subsequent) preventive examination. ? ?Virtual Visit via Telephone Note ? ?I connected with  Tammie Lopez on 02/09/22 at  1:30 PM EDT by telephone and verified that I am speaking with the correct person using two identifiers. ? ?Location: ?Patient: home ?Provider: [place] ?Persons participating in the virtual visit: patient/Nurse Health Advisor ?  ?I discussed the limitations, risks, security and privacy concerns of performing an evaluation and management service by telephone and the availability of in person appointments. The patient expressed understanding and agreed to proceed. ? ?Interactive audio and video telecommunications were attempted between this nurse and patient, however failed, due to patient having technical difficulties OR patient did not have access to video capability.  We continued and completed visit with audio only. ? ?Some vital signs may be absent or patient reported.  ? ?Clemetine Marker, LPN ? ? ?Review of Systems    ? ?Cardiac Risk Factors include: advanced age (>81mn, >>51women) ? ?   ?Objective:  ?  ?Today's Vitals  ? 02/09/22 1334  ?PainSc: 8   ? ?There is no height or weight on file to calculate BMI. ? ?Advanced Directives 02/09/2022 10/20/2021 02/09/2021 12/18/2020 12/14/2018 11/24/2018 02/07/2018  ?Does Patient Have a Medical Advance Directive? No No No No No No No  ?Would patient like information on creating a medical advance directive? No - Patient declined - No - Patient declined No - Patient declined - No - Patient declined -  ? ? ?Current Medications (verified) ?Outpatient Encounter Medications as of 02/09/2022  ?Medication Sig  ? amLODipine (NORVASC) 2.5 MG tablet Take 1 tablet (2.5 mg total) by mouth daily.  ? Cholecalciferol (VITAMIN D) 50 MCG (2000 UT) CAPS Take 2,000 Units by mouth daily.  ? meloxicam (MOBIC) 15 MG tablet Take 1 tablet (15 mg total) by mouth daily.  ? omeprazole (PRILOSEC) 40 MG  capsule TAKE 1 CAPSULE BY MOUTH EVERY DAY  ? Peginterferon Beta-1a (PLEGRIDY) 125 MCG/0.5ML SOPN Inject into the skin. Inject the contents of 1 pen (125 mcg) under the skin every fourteen (14) days.  ? Plecanatide (TRULANCE) 3 MG TABS Take 1 tablet by mouth daily at 12 noon. In place of linzess  ? pregabalin (LYRICA) 50 MG capsule Take 1-3 capsules (50-150 mg total) by mouth 3 (three) times daily.  ? progesterone (PROMETRIUM) 100 MG capsule Take 100 mg by mouth at bedtime.  ? Semaglutide, 1 MG/DOSE, 4 MG/3ML SOPN Inject 1 mg as directed once a week.  ? traZODone (DESYREL) 50 MG tablet TAKE 1/2 TO 1 TABLET BY MOUTH AT BEDTIME AS NEEDED FOR SLEEP.  ? Ubrogepant (UBRELVY) 100 MG TABS Take 1 tablet by mouth daily as needed. For migraine headaches  ? [DISCONTINUED] triamcinolone (KENALOG) 0.1 % Apply 1 application topically 2 (two) times daily.  ? ?No facility-administered encounter medications on file as of 02/09/2022.  ? ? ?Allergies (verified) ?Penicillins  ? ?History: ?Past Medical History:  ?Diagnosis Date  ? Allergy   ? Constipation   ? Diverticula of colon   ? Family history of adverse reaction to anesthesia   ? mother's BP increased  ? Hypertension   ? Insomnia   ? Migraine   ? migraines  ? MS (multiple sclerosis) ([place]   ? Muscle spasm   ? ?Past Surgical History:  ?Procedure Laterality Date  ? BREAST BIOPSY Left 01/06/2018  ?  2 areas  neg  ? BUNIONECTOMY    ?  CESAREAN SECTION N/A 2006  ? COLONOSCOPY WITH PROPOFOL N/A 02/09/2021  ? Procedure: COLONOSCOPY WITH PROPOFOL;  Surgeon: [name], MD;  Location: [place];  Service: Endoscopy;  Laterality: N/A;  ? GANGLION CYST EXCISION Bilateral   ? Hands  ? POLYPECTOMY N/A 02/09/2021  ? Procedure: POLYPECTOMY;  Surgeon: [name], MD;  Location: [place];  Service: Endoscopy;  Laterality: N/A;  ? ?Family History  ?Problem Relation Age of Onset  ? Diabetes Mother   ? Hypertension Mother   ? Cancer Father 9  ?     Mouth  ? COPD Father   ?  Hypertension Brother   ? Diabetes Brother   ? Breast cancer Neg Hx   ? ?Social History  ? ?Socioeconomic History  ? Marital status: Single  ?  Spouse name: Not on file  ? Number of children: 3  ? Years of education: Not on file  ? Highest education level: High school graduate  ?Occupational History  ?  Comment: bus driver  ?Tobacco Use  ? Smoking status: Never  ? Smokeless tobacco: Never  ? Tobacco comments:  ?  smoking cessation materials not required  ?Vaping Use  ? Vaping Use: Never used  ?Substance and Sexual Activity  ? Alcohol use: No  ? Drug use: No  ? Sexual activity: Not Currently  ?  Partners: Male  ?Other Topics Concern  ? Not on file  ?Social History Narrative  ? 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 ?Social Determinants of Health  ? ?Financial Resource Strain: Low Risk   ? Difficulty of Paying Living Expenses: Not hard at all  ?Food Insecurity: No Food Insecurity  ? Worried About Charity fundraiser in the Last Year: Never true  ? Ran Out of Food in the Last Year: Never true  ?Transportation Needs: No Transportation Needs  ? Lack of Transportation (Medical): No  ? Lack of Transportation (Non-Medical): No  ?Physical Activity: Inactive  ? Days of Exercise per Week: 0 days  ? Minutes of Exercise per Session: 0 min  ?Stress: No Stress Concern Present  ? Feeling of Stress : Only a little  ?Social Connections: Moderately Isolated  ? Frequency of Communication with Friends and Family: More than three times a week  ? Frequency of Social Gatherings with Friends and Family: More than three times a week  ? Attends Religious Services: More than 4 times per year  ? Active Member of Clubs or Organizations: No  ? Attends Archivist Meetings: Never  ? Marital Status: Never married  ? ? ?Tobacco Counseling ?Counseling given: Not Answered ?Tobacco comments: smoking cessation materials not required ? ? ?Clinical Intake: ? ?Pre-visit preparation  completed: Yes ? ?Pain : 0-10 ?Pain Score: 8  ?Pain Type: Chronic pain ?Pain Location: Abdomen ?Pain Orientation: Left, Upper ?Pain Descriptors / Indicators: Discomfort ?Pain Onset: More than a month ago ?Pain Frequency: Constant ? ?  ? ?Nutritional Risks: Nausea/ vomitting/ diarrhea (nausea & constipation) ?Diabetes: No ? ?How often do you need to have someone help you when you read instructions, pamphlets, or other written materials from your doctor or pharmacy?: 1 - Never ? ? ? ?Interpreter Needed?: No ? ?Information entered by :: Clemetine Marker LPN ? ? ?Activities of Daily Living ?In your present state of health, do you have any difficulty performing the following activities: 02/09/2022 01/27/2022  ?Hearing? N N  ?Vision? N  Y  ?Difficulty concentrating or making decisions? N N  ?Walking or climbing stairs? Y Y  ?Dressing or bathing? N N  ?Doing errands, shopping? N N  ?Preparing Food and eating ? N -  ?Using the Toilet? N -  ?In the past six months, have you accidently leaked urine? N -  ?Do you have problems with loss of bowel control? N -  ?Managing your Medications? N -  ?Managing your Finances? N -  ?Housekeeping or managing your Housekeeping? N -  ?Some recent data might be hidden  ? ? ?Patient Care Team: ?Steele Sizer, MD as PCP - General (Family Medicine) ?Melina Copa, PA-C as Librarian, academic (Librarian, academic) ?[name], MD as Consulting Physician (Gastroenterology) ?Will Bonnet, MD (Obstetrics and Gynecology) ? ?Indicate any recent Medical Services you may have received from other than [facility] providers in the past year (date may be approximate). ? ?   ?Assessment:  ? This is a routine wellness examination for Tammie Lopez. ? ?Hearing/Vision screen ?Hearing Screening - Comments:: Pt has no difficulty hearing ?Vision Screening - Comments:: Annual vision screenings done by Dr. Gloriann Loan ? ?Dietary issues and exercise activities discussed: ?Current Exercise Habits: The patient does not participate in  regular exercise at present, Exercise limited by: neurologic condition(s) ? ? Goals Addressed   ? ?  ?  ?  ?  ? This Visit's Progress  ?  DIET - INCREASE WATER INTAKE   On track  ?  Recommend drinking 6-8 glasse

## 2022-02-12 ENCOUNTER — Ambulatory Visit: Payer: Medicare Other | Admitting: Family Medicine

## 2022-02-12 NOTE — Progress Notes (Signed)
Name: Tammie Lopez   MRN: [id number]    DOB: [birth year]   Date:02/15/2022 ? ?     Progress Note ? ?Subjective ? ?Chief Complaint ? ?Abdominal Imaging ? ?HPI ? ?LUQ pain: seen by Dr. Allen Norris September 22 she was given Linzess but states cannot take it daily because it causes diarrhea. 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 She continues to have bloating and distention of left upper quadrant that is worse after meals, causes discomfort. It improves with a bowel movement. Pain on left upper quadrant much worse when she goes multiple days without a bowel movement. She states she also has a recurrent pain on left lower quadrant. It is present during palpation and feels a mass , she had colonic cleanse but did not help with symptoms. She has [place] that can be 1 , after she took laxatives it went to a 6. She states never a type 4. Usually 1-2 unless she takes a laxative. Denies fever, chills or vomiting ? ?Colonic cleanse last Tuesday  ?Laxatives and prune juice Friday  ?Last bowel movements Saturday ? ?Wait until tomorrow to get KUB  ? ? ?Patient Active Problem List  ? Diagnosis Date Noted  ? Personal history of colonic polyps   ? Polyp of descending colon   ? Eczema 01/20/2021  ? 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 Low TSH level 09/19/2019  ? Obesity (BMI 30-39.9) 12/06/2017  ? Insulin resistance 09/27/2017  ? Essential hypertension 09/21/2017  ? Right sided sciatica 09/21/2017  ? Major depression, recurrent ([place]) 05/24/2017  ? Migraine without aura, not intractable 12/09/2016  ? Nickel allergy 12/09/2016  ? GERD without esophagitis 12/09/2016  ? Other neutropenia ([place]) 12/09/2016  ? Dyslipidemia 12/09/2016  ? Diverticulosis 12/09/2016  ? 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 Allergic rhinitis, seasonal 12/09/2016  ? Chronic constipation 12/09/2016  ? Vitamin D  deficiency 08/06/2013  ? MS (multiple sclerosis) ([place]) 04/17/2013  ? ? ?Past Surgical History:  ?Procedure Laterality Date  ? BREAST BIOPSY Left 01/06/2018  ?  2 areas  neg  ? BUNIONECTOMY    ? CESAREAN SECTION N/A 2006  ? COLONOSCOPY WITH PROPOFOL N/A 02/09/2021  ? Procedure: COLONOSCOPY WITH PROPOFOL;  Surgeon: [name], MD;  Location: [place];  Service: Endoscopy;  Laterality: N/A;  ? GANGLION CYST EXCISION Bilateral   ? Hands  ? POLYPECTOMY N/A 02/09/2021  ? Procedure: POLYPECTOMY;  Surgeon: [name], MD;  Location: [place];  Service: Endoscopy;  Laterality: N/A;  ? ? ?Family History  ?Problem Relation Age of Onset  ? Diabetes Mother   ? Hypertension Mother   ? Cancer Father 84  ?     Mouth  ? COPD Father   ? Hypertension Brother   ? Diabetes Brother   ? Breast cancer Neg Hx   ? ? ?Social History  ? ?Tobacco Use  ? Smoking status: Never  ? Smokeless tobacco: Never  ? Tobacco comments:  ?  smoking cessation materials not required  ?Substance Use Topics  ? Alcohol use: No  ? ? ? ?Current Outpatient Medications:  ?  amLODipine (NORVASC) 2.5 MG tablet, Take 1 tablet (2.5 mg total) by mouth daily., Disp: 90 tablet, Rfl: 1 ?  Cholecalciferol (VITAMIN D) 50 MCG (2000 UT) CAPS, Take 2,000 Units by mouth daily., Disp: , Rfl:  ?  meloxicam (MOBIC) 15 MG tablet, Take 1 tablet (15 mg total) by mouth daily., Disp: 90 tablet, Rfl: 0 ?  omeprazole (PRILOSEC) 40 MG capsule, TAKE 1 CAPSULE BY MOUTH EVERY DAY, Disp: 90 capsule, Rfl: 1 ?  Peginterferon Beta-1a (PLEGRIDY) 125 MCG/0.5ML SOPN, Inject into the skin. Inject the contents of 1 pen (125 mcg) under the skin every fourteen (14) days., Disp: , Rfl:  ?  Plecanatide (TRULANCE) 3 MG TABS, Take 1 tablet by mouth daily at 12 noon. In place of linzess, Disp: 90 tablet, Rfl: 1 ?  pregabalin (LYRICA) 50 MG capsule, Take 1-3 capsules (50-150 mg total) by mouth 3 (three) times daily., Disp: 90 capsule, Rfl: 0 ?  progesterone (PROMETRIUM) 100 MG capsule,  Take 100 mg by mouth at bedtime., Disp: , Rfl:  ?  Semaglutide, 1 MG/DOSE, 4 MG/3ML SOPN, Inject 1 mg as directed once a week., Disp: 9 mL, Rfl: 0 ?  traZODone (DESYREL) 50 MG tablet, TAKE 1/2 TO 1 TABLET BY MOUTH AT BEDTIME AS NEEDED FOR SLEEP., Disp: 90 tablet, Rfl: 0 ?  Ubrogepant (UBRELVY) 100 MG TABS, Take 1 tablet by mouth daily as needed. For migraine headaches, Disp: 16 tablet, Rfl: 1 ? ?Allergies  ?Allergen Reactions  ? Penicillins Hives  ? ? ?I personally reviewed active problem list, medication list, allergies, family history, social history, health maintenance with the patient/caregiver today. ? ? ?ROS ? ?Ten systems reviewed and is negative except as mentioned in HPI ? ?Objective ? ?Vitals:  ? 02/15/22 0902  ?BP: 126/80  ?Pulse: 98  ?Resp: 16  ?SpO2: 99%  ?Weight: 159 lb (72.1 kg)  ?Height: '5\' 2"'$  (1.575 m)  ? ? ?Body mass index is 29.08 kg/m?. ? ?Physical Exam ? ?Constitutional: Patient appears well-developed and well-nourished. Overweight.  No distress.  ?HEENT: head atraumatic, normocephalic, pupils equal and reactive to light, , neck supple ?Cardiovascular: Normal rate, regular rhythm and normal heart sounds.  No murmur heard. No BLE edema. ?Pulmonary/Chest: Effort normal and breath sounds normal. No respiratory distress. ?Abdominal: Soft. There is normal bowel sounds , scar tissue below cesarean scar, normal masses felt, painful mostly on Left and upper quadrants.  ?Psychiatric: Patient has a normal mood and affect. behavior is normal. Judgment and thought content normal.  ? ?PHQ2/9: ?Depression screen [facility] 2/9 02/15/2022 02/09/2022 01/27/2022 12/17/2021 09/15/2021  ?Decreased Interest 0 0 1 0 0  ?Down, Depressed, Hopeless 0 0 1 0 1  ?PHQ - 2 Score 0 0 2 0 1  ?Altered sleeping '3 1 1 3 1  '$ ?Tired, decreased energy '3 1 1 3 '$ 0  ?Change in appetite 0 '1 1 3 '$ 0  ?Feeling bad or failure about yourself  0 0 1 0 0  ?Trouble concentrating 0 0 1 0 0  ?Moving slowly or fidgety/restless 0 0 0 0 0  ?Suicidal thoughts 0 0  0 0 0  ?PHQ-9 Score '6 3 7 9 2  '$ ?Difficult doing work/chores - Not difficult at all Somewhat difficult - -  ?Some recent data might be hidden  ?  ?phq 9 is positive ? ? ?Fall Risk: ?Fall Risk  02/15/2022 02/09/2022 01/27/2022 12/17/2021 09/15/2021  ?Falls in the past year? 0 0 0 0 0  ?Number falls in past yr: 0 0 0 0 0  ?Injury with Fall? 0 0 0 0 0  ?Risk for fall due to : No Fall Risks No Fall Risks - - No Fall Risks  ?Follow up Falls prevention discussed Falls prevention discussed - Falls prevention  discussed Falls prevention discussed  ? ? ? ? ?Functional Status Survey: ?Is the patient deaf or have difficulty hearing?: No ?Does the patient have difficulty seeing, even when wearing glasses/contacts?: No ?Does the patient have difficulty concentrating, remembering, or making decisions?: No ?Does the patient have difficulty walking or climbing stairs?: Yes ?Does the patient have difficulty dressing or bathing?: No ?Does the patient have difficulty doing errands alone such as visiting a doctor's office or shopping?: No ? ? ? ?Assessment & Plan ? ?1. Abdominal pain, left upper quadrant ? ?- hyoscyamine (LEVSIN) 0.125 MG tablet; Take 1 tablet (0.125 mg total) by mouth every 4 (four) hours as needed.  Dispense: 30 tablet; Refill: 0 ? ?2. Chronic idiopathic constipation ? ?Resume Linzess ? ?3. Left lower quadrant abdominal pain ? ?- hyoscyamine (LEVSIN) 0.125 MG tablet; Take 1 tablet (0.125 mg total) by mouth every 4 (four) hours as needed.  Dispense: 30 tablet; Refill: 0  ?

## 2022-02-15 ENCOUNTER — Encounter: Payer: Self-pay | Admitting: Family Medicine

## 2022-02-15 ENCOUNTER — Other Ambulatory Visit: Payer: Self-pay

## 2022-02-15 ENCOUNTER — Ambulatory Visit (INDEPENDENT_AMBULATORY_CARE_PROVIDER_SITE_OTHER): Payer: Medicare Other | Admitting: Family Medicine

## 2022-02-15 VITALS — BP 126/80 | HR 98 | Resp 16 | Ht 62.0 in | Wt 159.0 lb

## 2022-02-15 DIAGNOSIS — R1012 Left upper quadrant pain: Secondary | ICD-10-CM

## 2022-02-15 DIAGNOSIS — K5904 Chronic idiopathic constipation: Secondary | ICD-10-CM | POA: Diagnosis not present

## 2022-02-15 DIAGNOSIS — R1032 Left lower quadrant pain: Secondary | ICD-10-CM

## 2022-02-15 MED ORDER — HYOSCYAMINE SULFATE 0.125 MG PO TABS: 0.1250 mg | ORAL_TABLET | ORAL | 0 refills | Status: DC | PRN

## 2022-02-19 MED FILL — PLEGRIDY 125 MCG/0.5 ML SUBCUTANEOUS PEN INJECTOR: SUBCUTANEOUS | 28 days supply | Qty: 1 | Fill #4

## 2022-02-23 ENCOUNTER — Other Ambulatory Visit: Payer: Self-pay

## 2022-02-23 ENCOUNTER — Ambulatory Visit
Admission: RE | Admit: 2022-02-23 | Discharge: 2022-02-23 | Disposition: A | Discharge status: Home / Self Care | Payer: Medicare Other | Source: Ambulatory Visit | Attending: Family Medicine | Admitting: Family Medicine

## 2022-02-23 DIAGNOSIS — K5909 Other constipation: Secondary | ICD-10-CM | POA: Diagnosis present

## 2022-02-23 DIAGNOSIS — R1012 Left upper quadrant pain: Secondary | ICD-10-CM | POA: Diagnosis not present

## 2022-02-24 ENCOUNTER — Encounter: Payer: Self-pay | Admitting: Family Medicine

## 2022-02-27 ENCOUNTER — Other Ambulatory Visit: Payer: Self-pay | Admitting: Family Medicine

## 2022-02-27 DIAGNOSIS — G4701 Insomnia due to medical condition: Secondary | ICD-10-CM

## 2022-03-11 ENCOUNTER — Encounter: Payer: Self-pay | Admitting: Family Medicine

## 2022-03-17 DIAGNOSIS — G35 Multiple sclerosis: Principal | ICD-10-CM

## 2022-03-17 MED ORDER — PLEGRIDY 125 MCG/0.5 ML SUBCUTANEOUS PEN INJECTOR
SUBCUTANEOUS | 4 refills | 28 days
Start: 2022-03-17 — End: ?

## 2022-03-19 MED ORDER — PLEGRIDY 125 MCG/0.5 ML SUBCUTANEOUS PEN INJECTOR
SUBCUTANEOUS | 0 refills | 28 days | Status: CP
Start: 2022-03-19 — End: ?
  Filled 2022-03-24: qty 1, 28d supply, fill #0

## 2022-03-24 ENCOUNTER — Ambulatory Visit: Payer: Medicaid Other | Admitting: Gastroenterology

## 2022-03-24 NOTE — Progress Notes (Deleted)
Primary Care Physician: Steele Sizer, MD  Primary Gastroenterologist:  Dr. [name]  No chief complaint on file.   HPI: Tammie Lopez is a 51 y.o. female here with a history of having left upper quadrant pain in the past and had seen me for this.  At that time the patient's pain was made worse with movement and on physical exam was clearly musculoskeletal in nature with reproducing the pain wall just flexing the abdominal wall muscles and exacerbation with 1 finger light palpation on the abdominal wall muscles. The patient had a colonoscopy 1 year ago that showed a small polyp that was removed and was found to be a tubular adenoma.  Past Medical History:  Diagnosis Date   Allergy    Constipation    Diverticula of colon    Family history of adverse reaction to anesthesia    mother's BP increased   Hypertension    Insomnia    Migraine    migraines   MS (multiple sclerosis) (HCC)    Muscle spasm     Current Outpatient Medications  Medication Sig Dispense Refill   traZODone (DESYREL) 50 MG tablet TAKE 1/2 TO 1 TABLET BY MOUTH AT BEDTIME AS NEEDED FOR SLEEP 90 tablet 0   amLODipine (NORVASC) 2.5 MG tablet Take 1 tablet (2.5 mg total) by mouth daily. 90 tablet 1   Cholecalciferol (VITAMIN D) 50 MCG (2000 UT) CAPS Take 2,000 Units by mouth daily.     hyoscyamine (LEVSIN) 0.125 MG tablet Take 1 tablet (0.125 mg total) by mouth every 4 (four) hours as needed. 30 tablet 0   linaclotide (LINZESS) 290 MCG CAPS capsule Take 290 mcg by mouth daily before breakfast.     meloxicam (MOBIC) 15 MG tablet Take 1 tablet (15 mg total) by mouth daily. 90 tablet 0   omeprazole (PRILOSEC) 40 MG capsule TAKE 1 CAPSULE BY MOUTH EVERY DAY 90 capsule 1   Peginterferon Beta-1a (PLEGRIDY) 125 MCG/0.5ML SOPN Inject into the skin. Inject the contents of 1 pen (125 mcg) under the skin every fourteen (14) days.     pregabalin (LYRICA) 50 MG capsule Take 1-3 capsules (50-150 mg total) by mouth 3 (three)  times daily. 90 capsule 0   progesterone (PROMETRIUM) 100 MG capsule Take 100 mg by mouth at bedtime.     Semaglutide, 1 MG/DOSE, 4 MG/3ML SOPN Inject 1 mg as directed once a week. 9 mL 0   Ubrogepant (UBRELVY) 100 MG TABS Take 1 tablet by mouth daily as needed. For migraine headaches 16 tablet 1   No current facility-administered medications for this visit.    Allergies as of 03/24/2022 - Review Complete 02/15/2022  Allergen Reaction Noted   Penicillins Hives 08/26/2015    ROS:  General: Negative for anorexia, weight loss, fever, chills, fatigue, weakness. ENT: Negative for hoarseness, difficulty swallowing , nasal congestion. CV: Negative for chest pain, angina, palpitations, dyspnea on exertion, peripheral edema.  Respiratory: Negative for dyspnea at rest, dyspnea on exertion, cough, sputum, wheezing.  GI: See history of present illness. GU:  Negative for dysuria, hematuria, urinary incontinence, urinary frequency, nocturnal urination.  Endo: Negative for unusual weight change.    Physical Examination:   There were no vitals taken for this visit.  General: Well-nourished, well-developed in no acute distress.  Eyes: No icterus. Conjunctivae pink. Lungs: Clear to auscultation bilaterally. Non-labored. Heart: Regular rate and rhythm, no murmurs rubs or gallops.  Abdomen: Bowel sounds are normal, nontender, nondistended, no hepatosplenomegaly or masses,  no abdominal bruits or hernia , no rebound or guarding.   Extremities: No lower extremity edema. No clubbing or deformities. Neuro: Alert and oriented x 3.  Grossly intact. Skin: Warm and dry, no jaundice.   Psych: Alert and cooperative, normal mood and affect.  Labs:    Imaging Studies: DG Abd 1 View  Result Date: 02/24/2022 CLINICAL DATA:  Left upper quadrant pain for 1 year EXAM: ABDOMEN - 1 VIEW COMPARISON:  None. FINDINGS: Scattered large and small bowel gas is noted. Retained fecal material is noted within the right  colon. No obstructive changes are seen. No free air is noted. No bony abnormality is seen. IMPRESSION: Mild retained fecal material within the right colon. Electronically Signed   By: Inez Catalina M.D.   On: 02/24/2022 02:54    Assessment and Plan:   CRISOL MUECKE is a 51 y.o. y/o female ***     [name], MD. Marval Regal    Note: This dictation was prepared with Dragon dictation along with smaller phrase technology. Any transcriptional errors that result from this process are unintentional.

## 2022-04-06 ENCOUNTER — Encounter: Payer: Self-pay | Admitting: Emergency Medicine

## 2022-04-06 ENCOUNTER — Ambulatory Visit
Admission: EM | Admit: 2022-04-06 | Discharge: 2022-04-06 | Disposition: A | Discharge status: Home / Self Care | Payer: Medicare Other | Attending: Physician Assistant | Admitting: Physician Assistant

## 2022-04-06 ENCOUNTER — Ambulatory Visit (INDEPENDENT_AMBULATORY_CARE_PROVIDER_SITE_OTHER): Payer: Medicare Other

## 2022-04-06 ENCOUNTER — Other Ambulatory Visit: Payer: Self-pay

## 2022-04-06 DIAGNOSIS — M25571 Pain in right ankle and joints of right foot: Secondary | ICD-10-CM

## 2022-04-06 DIAGNOSIS — S93401A Sprain of unspecified ligament of right ankle, initial encounter: Secondary | ICD-10-CM

## 2022-04-06 MED ORDER — NAPROXEN 500 MG PO TABS: 500.0000 mg | ORAL_TABLET | Freq: Two times a day (BID) | ORAL | 0 refills | Status: AC | PRN

## 2022-04-06 NOTE — ED Triage Notes (Signed)
Pt states she twisted her ankle about 3 days ago. She states she fell. She has been applying ice without relief. No swelling.  ?

## 2022-04-06 NOTE — Discharge Instructions (Addendum)
SPRAIN: Stressed avoiding painful activities . Reviewed RICE guidelines. Use medications as directed, including NSAIDs. If no NSAIDs have been prescribed for you today, you may take Aleve or Motrin over the counter. May use Tylenol in between doses of NSAIDs.  If no improvement in the next 1-2 weeks, f/u with PCP or return to our office for reexamination, and please feel free to call or return at any time for any questions or concerns you may have and we will be happy to help you!     

## 2022-04-06 NOTE — ED Provider Notes (Signed)
?[facility] ? ? ? ?CSN: [id number] ?Arrival date & time: 04/06/22  0856 ? ? ?  ? ?History   ?Chief Complaint ?Chief Complaint  ?Patient presents with  ? Fall  ? Ankle Pain  ?  right  ? ? ?HPI ?Tammie Lopez is a 51 y.o. female presenting for 3-day history of right ankle pain and swelling.  Patient says she has MS and lost her balance and twisted her ankle.  Concerned about possible fracture at this time.  States she can bear weight but it is painful to do so.  She has been applying ice and keeping it elevated.  Has not taken anything for pain relief.  She is not reporting any numbness, tingling or weakness.  No other injuries.  No other complaints. ? ?HPI ? ?Past Medical History:  ?Diagnosis Date  ? Allergy   ? Constipation   ? Diverticula of colon   ? Family history of adverse reaction to anesthesia   ? mother's BP increased  ? Hypertension   ? Insomnia   ? Migraine   ? migraines  ? MS (multiple sclerosis) ([place])   ? Muscle spasm   ? ? ?Patient Active Problem List  ? Diagnosis Date Noted  ? Personal history of colonic polyps   ? Polyp of descending colon   ? Eczema 01/20/2021  ? 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 Low TSH level 09/19/2019  ? Obesity (BMI 30-39.9) 12/06/2017  ? Insulin resistance 09/27/2017  ? Essential hypertension 09/21/2017  ? Right sided sciatica 09/21/2017  ? Major depression, recurrent ([place]) 05/24/2017  ? Migraine without aura, not intractable 12/09/2016  ? Nickel allergy 12/09/2016  ? GERD without esophagitis 12/09/2016  ? Other neutropenia ([place]) 12/09/2016  ? Dyslipidemia 12/09/2016  ? Diverticulosis 12/09/2016  ? 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 Allergic rhinitis, seasonal 12/09/2016  ? Chronic constipation 12/09/2016  ? Vitamin D deficiency 08/06/2013  ? MS (multiple sclerosis) ([place]) 04/17/2013  ? ? ?Past Surgical History:  ?Procedure Laterality Date  ? BREAST BIOPSY Left 01/06/2018  ?  2 areas  neg  ? BUNIONECTOMY    ? CESAREAN  SECTION N/A 2006  ? COLONOSCOPY WITH PROPOFOL N/A 02/09/2021  ? Procedure: COLONOSCOPY WITH PROPOFOL;  Surgeon: [name], MD;  Location: [facility];  Service: Endoscopy;  Laterality: N/A;  ? GANGLION CYST EXCISION Bilateral   ? Hands  ? POLYPECTOMY N/A 02/09/2021  ? Procedure: POLYPECTOMY;  Surgeon: [name], MD;  Location: [facility];  Service: Endoscopy;  Laterality: N/A;  ? ? ?OB History   ?No obstetric history on file. ?  ? ? ? ?Home Medications   ? ?Prior to Admission medications   ?Medication Sig Start Date End Date Taking? Authorizing Provider  ?amLODipine (NORVASC) 2.5 MG tablet Take 1 tablet (2.5 mg total) by mouth daily. 12/17/21  Yes Sowles, Drue Stager, MD  ?linaclotide Rolan Lipa) 290 MCG CAPS capsule Take 290 mcg by mouth daily before breakfast.   Yes [provider]  ?naproxen (NAPROSYN) 500 MG tablet Take 1 tablet (500 mg total) by mouth 2 (two) times daily as needed for up to 7 days. 04/06/22 04/13/22 Yes Laurene Footman B, PA-C  ?omeprazole (PRILOSEC) 40 MG capsule TAKE 1 CAPSULE BY MOUTH EVERY DAY 12/17/21  Yes Sowles, Drue Stager, MD  ?Peginterferon Beta-1a (PLEGRIDY) 125 MCG/0.5ML SOPN Inject into the skin. Inject the contents of 1 pen (125 mcg) under the skin every fourteen (14) days. 11/25/20  Yes [provider]  ?  pregabalin (LYRICA) 50 MG capsule Take 1-3 capsules (50-150 mg total) by mouth 3 (three) times daily. 12/17/21  Yes Sowles, Drue Stager, MD  ?Semaglutide, 1 MG/DOSE, 4 MG/3ML SOPN Inject 1 mg as directed once a week. 01/27/22  Yes Sowles, Drue Stager, MD  ?traZODone (DESYREL) 50 MG tablet TAKE 1/2 TO 1 TABLET BY MOUTH AT BEDTIME AS NEEDED FOR SLEEP 03/01/22  Yes Sowles, Drue Stager, MD  ?Ubrogepant (UBRELVY) 100 MG TABS Take 1 tablet by mouth daily as needed. For migraine headaches 09/15/21  Yes Sowles, Drue Stager, MD  ?Cholecalciferol (VITAMIN D) 50 MCG (2000 UT) CAPS Take 2,000 Units by mouth daily.    [provider]  ?hyoscyamine (LEVSIN) 0.125 MG tablet Take 1  tablet (0.125 mg total) by mouth every 4 (four) hours as needed. 02/15/22   Steele Sizer, MD  ?progesterone (PROMETRIUM) 100 MG capsule Take 100 mg by mouth at bedtime. 08/05/21   [provider]  ? ? ?Family History ?Family History  ?Problem Relation Age of Onset  ? Diabetes Mother   ? Hypertension Mother   ? Cancer Father 85  ?     Mouth  ? COPD Father   ? Hypertension Brother   ? Diabetes Brother   ? Breast cancer Neg Hx   ? ? ?Social History ?Social History  ? ?Tobacco Use  ? Smoking status: Never  ? Smokeless tobacco: Never  ? Tobacco comments:  ?  smoking cessation materials not required  ?Vaping Use  ? Vaping Use: Never used  ?Substance Use Topics  ? Alcohol use: No  ? Drug use: No  ? ? ? ?Allergies   ?Penicillins ? ? ?Review of Systems ?Review of Systems  ?Musculoskeletal:  Positive for arthralgias and joint swelling. Negative for gait problem.  ?Skin:  Negative for color change and wound.  ?Neurological:  Negative for weakness and numbness.  ? ? ?Physical Exam ?Triage Vital Signs ?ED Triage Vitals  ?Enc Vitals Group  ?   BP 04/06/22 0937 (!) 143/106  ?   Pulse Rate 04/06/22 0937 78  ?   Resp 04/06/22 0937 16  ?   Temp 04/06/22 0937 98.4 ?F (36.9 ?C)  ?   Temp Source 04/06/22 0937 Oral  ?   SpO2 04/06/22 0937 97 %  ?   Weight 04/06/22 0934 158 lb 15.2 oz (72.1 kg)  ?   Height 04/06/22 0934 '5\' 2"'$  (1.575 m)  ?   Head Circumference --   ?   Peak Flow --   ?   Pain Score 04/06/22 0934 8  ?   Pain Loc --   ?   Pain Edu? --   ?   Excl. in [place]? --   ? ?No data found. ? ?Updated Vital Signs ?BP (!) 143/106 (BP Location: Left Arm)   Pulse 78   Temp 98.4 ?F (36.9 ?C) (Oral)   Resp 16   Ht '5\' 2"'$  (1.575 m)   Wt 158 lb 15.2 oz (72.1 kg)   LMP 03/16/2022   SpO2 97%   BMI 29.07 kg/m?  ?   ? ?Physical Exam ?Vitals and nursing note reviewed.  ?Constitutional:   ?   General: She is not in acute distress. ?   Appearance: Normal appearance. She is not ill-appearing or toxic-appearing.  ?HENT:  ?   Head:  Normocephalic and atraumatic.  ?Eyes:  ?   General: No scleral icterus.    ?   Right eye: No discharge.     ?   Left  eye: No discharge.  ?   Conjunctiva/sclera: Conjunctivae normal.  ?Cardiovascular:  ?   Rate and Rhythm: Normal rate.  ?   Pulses: Normal pulses.  ?Pulmonary:  ?   Effort: Pulmonary effort is normal. No respiratory distress.  ?Musculoskeletal:  ?   Cervical back: Neck supple.  ?   Right ankle: Swelling (mild swelling anterior ankle) present. No deformity or ecchymosis. Tenderness present over the ATF ligament and AITF ligament. Decreased range of motion. Normal pulse.  ?   Right Achilles Tendon: No tenderness.  ?   Right foot: Normal.  ?Neurological:  ?   General: No focal deficit present.  ?   Mental Status: She is alert. Mental status is at baseline.  ?   Motor: No weakness.  ?   Coordination: Coordination normal.  ?   Gait: Gait normal.  ?Psychiatric:     ?   Mood and Affect: Mood normal.     ?   Behavior: Behavior normal.     ?   Thought Content: Thought content normal.  ? ? ? ?UC Treatments / Results  ?Labs ?(all labs ordered are listed, but only abnormal results are displayed) ?Labs Reviewed - No data to display ? ?EKG ? ? ?Radiology ?DG Ankle Complete Right ? ?Result Date: 04/06/2022 ?CLINICAL DATA:  Status post fall.  Twisted ankle 3 days ago. EXAM: RIGHT ANKLE - COMPLETE 3+ VIEW COMPARISON:  None Available. FINDINGS: The soft tissues are unremarkable. There is no underlying fracture or dislocation. No significant arthropathy. IMPRESSION: Negative. Electronically Signed   By: Kerby Moors M.D.   On: 04/06/2022 10:13   ? ?Procedures ?Procedures (including critical care time) ? ?Medications Ordered in UC ?Medications - No data to display ? ?Initial Impression / Assessment and Plan / UC Course  ?I have reviewed the triage vital signs and the nursing notes. ? ?Pertinent labs & imaging results that were available during my care of the patient were reviewed by me and considered in my medical  decision making (see chart for details). ? ?51 year old female with history of MS and balance problems presents for right ankle pain and swelling for the past 3 days following an accidental fall and inversion injury.  Patient has

## 2022-04-13 ENCOUNTER — Other Ambulatory Visit: Payer: Self-pay | Admitting: Family Medicine

## 2022-04-13 DIAGNOSIS — R7303 Prediabetes: Secondary | ICD-10-CM

## 2022-04-13 MED ORDER — SEMAGLUTIDE (1 MG/DOSE) 4 MG/3ML ~~LOC~~ SOPN: 1.0000 mg | PEN_INJECTOR | SUBCUTANEOUS | 0 refills | Status: DC

## 2022-04-14 DIAGNOSIS — G35 Multiple sclerosis: Principal | ICD-10-CM

## 2022-04-14 MED ORDER — PLEGRIDY 125 MCG/0.5 ML SUBCUTANEOUS PEN INJECTOR
SUBCUTANEOUS | 0 refills | 28 days
Start: 2022-04-14 — End: ?

## 2022-04-16 MED ORDER — PLEGRIDY 125 MCG/0.5 ML SUBCUTANEOUS PEN INJECTOR
SUBCUTANEOUS | 0 refills | 28 days | Status: CP
Start: 2022-04-16 — End: ?

## 2022-04-28 ENCOUNTER — Other Ambulatory Visit: Payer: Self-pay | Admitting: Family Medicine

## 2022-04-28 ENCOUNTER — Ambulatory Visit
Admit: 2022-04-28 | Discharge: 2022-04-29 | Payer: BLUE CROSS/BLUE SHIELD | Attending: Physician Assistant | Primary: Physician Assistant

## 2022-04-28 DIAGNOSIS — Z1231 Encounter for screening mammogram for malignant neoplasm of breast: Secondary | ICD-10-CM

## 2022-04-28 DIAGNOSIS — G35 Multiple sclerosis: Principal | ICD-10-CM

## 2022-05-10 DIAGNOSIS — Z79899 Other long term (current) drug therapy: Secondary | ICD-10-CM | POA: Diagnosis not present

## 2022-05-10 DIAGNOSIS — R2681 Unsteadiness on feet: Secondary | ICD-10-CM | POA: Diagnosis not present

## 2022-05-10 DIAGNOSIS — G35 Multiple sclerosis: Secondary | ICD-10-CM | POA: Diagnosis not present

## 2022-05-26 ENCOUNTER — Ambulatory Visit
Admission: RE | Admit: 2022-05-26 | Discharge: 2022-05-26 | Disposition: A | Discharge status: Home / Self Care | Payer: Medicare Other | Source: Ambulatory Visit | Attending: Family Medicine | Admitting: Family Medicine

## 2022-05-26 DIAGNOSIS — Z1231 Encounter for screening mammogram for malignant neoplasm of breast: Secondary | ICD-10-CM | POA: Insufficient documentation

## 2022-05-28 ENCOUNTER — Encounter: Payer: Self-pay | Admitting: Family Medicine

## 2022-05-28 ENCOUNTER — Ambulatory Visit (INDEPENDENT_AMBULATORY_CARE_PROVIDER_SITE_OTHER): Payer: Medicare Other | Admitting: Family Medicine

## 2022-05-28 VITALS — BP 122/78 | HR 94 | Resp 16 | Ht 62.0 in | Wt 155.0 lb

## 2022-05-28 DIAGNOSIS — G35 Multiple sclerosis: Secondary | ICD-10-CM

## 2022-05-28 DIAGNOSIS — Z9225 Personal history of immunosupression therapy: Secondary | ICD-10-CM

## 2022-05-28 DIAGNOSIS — B353 Tinea pedis: Secondary | ICD-10-CM

## 2022-05-28 DIAGNOSIS — R7303 Prediabetes: Secondary | ICD-10-CM

## 2022-05-28 DIAGNOSIS — G4701 Insomnia due to medical condition: Secondary | ICD-10-CM

## 2022-05-28 DIAGNOSIS — I10 Essential (primary) hypertension: Secondary | ICD-10-CM

## 2022-05-28 DIAGNOSIS — R7989 Other specified abnormal findings of blood chemistry: Secondary | ICD-10-CM

## 2022-05-28 DIAGNOSIS — S93401D Sprain of unspecified ligament of right ankle, subsequent encounter: Secondary | ICD-10-CM | POA: Diagnosis not present

## 2022-05-28 DIAGNOSIS — Z23 Encounter for immunization: Secondary | ICD-10-CM

## 2022-05-28 DIAGNOSIS — B351 Tinea unguium: Secondary | ICD-10-CM

## 2022-05-28 DIAGNOSIS — F325 Major depressive disorder, single episode, in full remission: Secondary | ICD-10-CM | POA: Diagnosis not present

## 2022-05-28 DIAGNOSIS — K219 Gastro-esophageal reflux disease without esophagitis: Secondary | ICD-10-CM

## 2022-05-28 MED ORDER — TERBINAFINE HCL 250 MG PO TABS: 250.0000 mg | ORAL_TABLET | Freq: Every day | ORAL | 0 refills | Status: DC

## 2022-05-28 MED ORDER — SEMAGLUTIDE (1 MG/DOSE) 4 MG/3ML ~~LOC~~ SOPN: 1.0000 mg | PEN_INJECTOR | SUBCUTANEOUS | 0 refills | Status: DC

## 2022-05-28 NOTE — Progress Notes (Signed)
Name: Tammie Lopez   MRN: [id number]    DOB: 25-Nov-1971   Date:05/28/2022       Progress Note  Subjective  Chief Complaint  Follow Up  HPI  HTN: BP is at goal. She has been compliant with medications now. She denies chest pain, dizziness or palpitation  BP was high when she went to urgent care for right ankle sprain   MS: she has been off Tacfidera due to leucopenia, she was going to [facility] neurology, and was taking  Plegridy. She is now seeing Dr. Annamaria Helling  at [facility] and has been off all medications, getting labs done and will go back to see him to discuss medication management. Referred to PT for right lower extremity weakness and is starting on July 7th - she will be on immunosuppressive medication and asked for shingrix vaccine we offered to send her to pharmacy but she states she will pay for it out of pocket if insurance does not cover it   Low back pain: going on for past couple of years, improves with meloxicam pain is aching like and constant, can radiate to pelvic area but not her legs. She has not  been to Dr. Joyce Copa - chiropractor lately, but she will start going back now   Obesity: she has insulin resistance, took Trulicity,  and Metformin in the past , currently on Ozempic 0.5 mg since October 2022 we will try to increase dose to 1 mg since March and lost another 4 lbs since started on higher dose . She stopped drinking regular sodas   Pre-diabetes: last A1C improved, but used to be elevated at 5.7 % , she also has HTN and increase in abdominal girth, she has been taking Ozempic and denies side effects   Depression: she is frustrated about inability to lose weight, we will go up on dose of Ozempic today. She is also very concerned about abdominal fold, getting married next year and states not feeling good about her body image and does not want to have sex. She states she has a friend that had abdominal panniculectomy and she would like to go see Psychiatric nurse in [place],  Dr. Claudia Desanctis, she has not seen him yet. She states feeling better lately   LUQ pain: seen by Dr. Allen Norris last September she was given Linzess but states cannot take it daily because it causes diarrhea. We will try switching to Trulance. She continues to have bloating and distention of left upper quadrant that is worse after meals, causes discomfort. It improves with a bowel movement. She was referred back but she cancelled the visit   Tinea pedis: and also onychomycosis right first toe: we will treat with oral medication, discussed risk and importance of recheck liver function in 6 weeks.   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 07/16/2020   Low TSH level 09/19/2019   Obesity (BMI 30-39.9) 12/06/2017   Insulin resistance 09/27/2017   Essential hypertension 09/21/2017   Right sided sciatica 09/21/2017   Major depression, recurrent ([place]) 05/24/2017   Migraine without aura, not intractable 12/09/2016   Nickel allergy 12/09/2016   GERD without esophagitis 12/09/2016   Other neutropenia ([place]) 12/09/2016   Dyslipidemia 12/09/2016  Diverticulosis 12/09/2016   Proctitis 12/09/2016   Allergic rhinitis, seasonal 12/09/2016   Chronic constipation 12/09/2016   Vitamin D deficiency 08/06/2013   MS (multiple sclerosis) ([place]) 04/17/2013    Past Surgical History:  Procedure Laterality Date   BREAST BIOPSY Left 01/06/2018    2 areas  neg   BUNIONECTOMY     CESAREAN SECTION N/A 2006   COLONOSCOPY WITH PROPOFOL N/A 02/09/2021   Procedure: COLONOSCOPY WITH PROPOFOL;  Surgeon: [name], MD;  Location: [facility];  Service: Endoscopy;  Laterality: N/A;   GANGLION CYST EXCISION Bilateral    Hands   POLYPECTOMY N/A 02/09/2021    Procedure: POLYPECTOMY;  Surgeon: [name], MD;  Location: [facility];  Service: Endoscopy;  Laterality: N/A;    Family History  Problem Relation Age of Onset   Diabetes Mother    Hypertension Mother    Cancer Father 67       Mouth   COPD Father    Hypertension Brother    Diabetes Brother    Breast cancer Neg Hx     Social History   Tobacco Use   Smoking status: Never   Smokeless tobacco: Never   Tobacco comments:    smoking cessation materials not required  Substance Use Topics   Alcohol use: No     Current Outpatient Medications:    amLODipine (NORVASC) 2.5 MG tablet, Take 1 tablet (2.5 mg total) by mouth daily., Disp: 90 tablet, Rfl: 1   Cholecalciferol (VITAMIN D) 50 MCG (2000 UT) CAPS, Take 2,000 Units by mouth daily., Disp: , Rfl:    hyoscyamine (LEVSIN) 0.125 MG tablet, Take 1 tablet (0.125 mg total) by mouth every 4 (four) hours as needed., Disp: 30 tablet, Rfl: 0   linaclotide (LINZESS) 290 MCG CAPS capsule, Take 290 mcg by mouth daily before breakfast., Disp: , Rfl:    omeprazole (PRILOSEC) 40 MG capsule, TAKE 1 CAPSULE BY MOUTH EVERY DAY, Disp: 90 capsule, Rfl: 1   pregabalin (LYRICA) 50 MG capsule, Take 1-3 capsules (50-150 mg total) by mouth 3 (three) times daily., Disp: 90 capsule, Rfl: 0   progesterone (PROMETRIUM) 100 MG capsule, Take 100 mg by mouth at bedtime., Disp: , Rfl:    Semaglutide, 1 MG/DOSE, 4 MG/3ML SOPN, Inject 1 mg as directed once a week., Disp: 9 mL, Rfl: 0   traZODone (DESYREL) 50 MG tablet, TAKE 1/2 TO 1 TABLET BY MOUTH AT BEDTIME AS NEEDED FOR SLEEP, Disp: 90 tablet, Rfl: 0   Ubrogepant (UBRELVY) 100 MG TABS, Take 1 tablet by mouth daily as needed. For migraine headaches, Disp: 16 tablet, Rfl: 1   Peginterferon Beta-1a (PLEGRIDY) 125 MCG/0.5ML SOPN, Inject into the skin. Inject the contents of 1 pen (125 mcg) under the skin every fourteen (14) days. (Patient not taking: Reported on 05/28/2022), Disp: , Rfl:   Allergies  Allergen  Reactions   Penicillins Hives    I personally reviewed active problem list, medication list, allergies, family history, social history, health maintenance with the patient/caregiver today.   ROS  Ten systems reviewed and is negative except as mentioned in HPI   Objective  Vitals:   05/28/22 1110  BP: 122/78  Pulse: 94  Resp: 16  SpO2: 99%  Weight: 155 lb (70.3 kg)  Height: '5\' 2"'$  (1.575 m)    Body mass index is 28.35 kg/m.  Physical Exam  Constitutional: Patient appears well-developed and well-nourished. Overweight.  No distress.  HEENT: head atraumatic, normocephalic, pupils equal and reactive to light,  neck supple Cardiovascular: Normal rate, regular rhythm and normal heart sounds.  No murmur heard. No BLE edema. Pulmonary/Chest: Effort normal and breath sounds normal. No respiratory distress. Abdominal: Soft.  There is no tenderness. Psychiatric: Patient has a normal mood and affect. behavior is normal. Judgment and thought content normal.  Muscular skeletal: pain during palpation of lateral aspect of right foot Skin/nails: brittle nail of right first toe and also desquamation of skin on lateral right foot    PHQ2/9:    05/28/2022   11:09 AM 02/15/2022    9:02 AM 02/09/2022    1:47 PM 01/27/2022   11:00 AM 12/17/2021    9:35 AM  Depression screen PHQ 2/9  Decreased Interest 0 0 0 1 0  Down, Depressed, Hopeless 0 0 0 1 0  PHQ - 2 Score 0 0 0 2 0  Altered sleeping '3 3 1 1 3  '$ Tired, decreased energy 0 '3 1 1 3  '$ Change in appetite 0 0 '1 1 3  '$ Feeling bad or failure about yourself  0 0 0 1 0  Trouble concentrating 0 0 0 1 0  Moving slowly or fidgety/restless 0 0 0 0 0  Suicidal thoughts 0 0 0 0 0  PHQ-9 Score '3 6 3 7 9  '$ Difficult doing work/chores   Not difficult at all Somewhat difficult     phq 9 is negative   Fall Risk:    05/28/2022   11:09 AM 02/15/2022    8:58 AM 02/09/2022    1:50 PM 01/27/2022   11:00 AM 12/17/2021    9:34 AM  Fall Risk   Falls in the  past year? 0 0 0 0 0  Number falls in past yr: 0 0 0 0 0  Injury with Fall? 0 0 0 0 0  Risk for fall due to : No Fall Risks No Fall Risks No Fall Risks    Follow up Falls prevention discussed Falls prevention discussed Falls prevention discussed  Falls prevention discussed      Functional Status Survey: Is the patient deaf or have difficulty hearing?: No Does the patient have difficulty seeing, even when wearing glasses/contacts?: No Does the patient have difficulty concentrating, remembering, or making decisions?: No Does the patient have difficulty walking or climbing stairs?: No Does the patient have difficulty dressing or bathing?: No Does the patient have difficulty doing errands alone such as visiting a doctor's office or shopping?: No    Assessment & Plan   1. MS (multiple sclerosis) ([place])  Off medications, seeing a new neurologist   2. Need for shingles vaccine  - Varicella-zoster vaccine IM (Shingrix)  3. Major depression in remission ([place])   4. Sprain of right ankle, unspecified ligament, subsequent encounter  - Ambulatory referral to Orthopedic Surgery  5. Personal history of immunosupression therapy   6. Low TSH level  - TSH  7. Onychomycosis  - Hepatic function panel - terbinafine (LAMISIL) 250 MG tablet; Take 1 tablet (250 mg total) by mouth daily.  Dispense: 90 tablet; Refill: 0  8. Tinea pedis of right foot  - Hepatic function panel - terbinafine (LAMISIL) 250 MG tablet; Take 1 tablet (250 mg total) by mouth daily.  Dispense: 90 tablet; Refill: 0  9. GERD without esophagitis   10. Pre-diabetes  - Semaglutide, 1 MG/DOSE, 4 MG/3ML SOPN; Inject 1 mg as directed once a week.  Dispense: 9 mL; Refill: 0  11. Hypertension, benign  Controlled   12. Insomnia due to medical condition

## 2022-06-03 ENCOUNTER — Ambulatory Visit: Payer: Medicare Other | Attending: Neurology

## 2022-06-03 DIAGNOSIS — M5417 Radiculopathy, lumbosacral region: Secondary | ICD-10-CM | POA: Diagnosis not present

## 2022-06-03 DIAGNOSIS — R262 Difficulty in walking, not elsewhere classified: Secondary | ICD-10-CM | POA: Diagnosis not present

## 2022-06-03 DIAGNOSIS — M6281 Muscle weakness (generalized): Secondary | ICD-10-CM | POA: Insufficient documentation

## 2022-06-03 DIAGNOSIS — M5459 Other low back pain: Secondary | ICD-10-CM | POA: Insufficient documentation

## 2022-06-03 NOTE — Therapy (Signed)
[facility] [address] [place], [address] Phone: [phone]   Fax:  [phone]  Physical Therapy Evaluation  Patient Details  Name: Tammie Lopez MRN: [id number] Date of Birth: [birth year] Referring Provider (PT): Hartsell, Marga Hoots, MD   Encounter Date: 06/03/2022   PT End of Session - 06/03/22 0926     Visit Number 1    Number of Visits 17    Date for PT Re-Evaluation 07/29/22    Authorization Type 1    Authorization Time Period of 10 progress report    PT Start Time 0931    PT Stop Time 1018    PT Time Calculation (min) 47 min    Activity Tolerance Patient tolerated treatment well    Behavior During Therapy [facility] for tasks assessed/performed             Past Medical History:  Diagnosis Date   Allergy    Constipation    Diverticula of colon    Family history of adverse reaction to anesthesia    mother's BP increased   Hypertension    Insomnia    Migraine    migraines   MS (multiple sclerosis) ([place])    Muscle spasm     Past Surgical History:  Procedure Laterality Date   BREAST BIOPSY Left 01/06/2018    2 areas  neg   BUNIONECTOMY     CESAREAN SECTION N/A 2006   COLONOSCOPY WITH PROPOFOL N/A 02/09/2021   Procedure: COLONOSCOPY WITH PROPOFOL;  Surgeon: [name], MD;  Location: [place];  Service: Endoscopy;  Laterality: N/A;   GANGLION CYST EXCISION Bilateral    Hands   POLYPECTOMY N/A 02/09/2021   Procedure: POLYPECTOMY;  Surgeon: [name], MD;  Location: [place];  Service: Endoscopy;  Laterality: N/A;    There were no vitals filed for this visit.    Subjective Assessment - 06/03/22 0937     Subjective Low back: 6/10 currently, 9/10 at worst for the past 3 months (mainly R low back). L posterior hip/sciatic area: 10/10 currently and at worst.    Pertinent History MS/Balance, gait. Diagnosed in 2012 with MS. Has had low back pain since 2  years ago. Has not yet had PT for back pain, went to chiropractor which helped. No change R LE strenght. Pt states if her low back is not hurting, her R LE does not feel as weak. Pt also states feeling L LE sciatic nerve symptoms (L posterior hip). Uses the Be ACtive + L leg which helps with pain.  Pt also feels numbeness L toes dorsal side. Feels like her  L lateral trunk feels swollen, which is the area her MS is dominant on. Was taking Plegridy for MS which was making her muscles even weaker and hurt more. Currently not taking medications as a cool down period. Will see Dr. Marianne Sofia in October 2023. Has had L posterior hip/Sciatic symptoms since about 1 .5 years ago, started when pt was taking Plegrity last January 2022. Pt states that she feels like her back pain makes her balance worse.    Patient Stated Goals Not to hurt. Walk without hurting.    Currently in Pain? Yes    Pain Score 10-Worst pain ever   L posterior hip   Pain Type Chronic pain    Pain Radiating Towards L posterior hip pain, R low back pain, L toes 1-5 dorsal numbness.    Pain Onset  More than a month ago    Pain Frequency Constant    Aggravating Factors  L posterior hip: randomly appears, leaning forward to the L. Pt can be sitting; Low back: laying on her stomach, prolonged standing, bending over.    Pain Relieving Factors L posterior hip: L piriformis stretch. Low back: laying on her back, heat                [facility] PT Assessment - 06/03/22 0932       Assessment   Medical Diagnosis MS/Balance, gait    Referring Provider (PT) Nigel Bridgeman, Marga Hoots, MD    Onset Date/Surgical Date 05/18/22   Date PT referral signed   Prior Therapy Pt has not yet had PT for back pain      Precautions   Precaution Comments Forward neck, B protracted shoulders, R shoulder higher, R lateral shift      Restrictions   Other Position/Activity Restrictions No known restrictions      Balance Screen   Has the patient fallen in  the past 6 months Yes    How many times? 1    Has the patient had a decrease in activity level because of a fear of falling?  No    Is the patient reluctant to leave their home because of a fear of falling?  No      Home Environment   Additional Comments Pt lives in a 2 story home with son (8 years old), 12 steps to go up to 2nd floor, R rail. Garage door, one step, no rails.      AROM   Lumbar Flexion WFL with horizontal low back pain/rubber band feeling.   increased L foot numbness   Lumbar Extension limited with decreased L toe pain.    Lumbar - Right Side Bend Limited with L trunk stretch    Lumbar - Left Side Bend WFL, wiht L PSIS pain, no R low back pain    Lumbar - Right Rotation WFL    Lumbar - Left Rotation [facility]      Strength   Right Hip Flexion 4/5    Right Hip Extension 3+/5    Right Hip ABduction 4/5    Left Hip Flexion 4+/5    Left Hip Extension 4-/5    Left Hip ABduction 4/5    Right Knee Flexion 4+/5    Right Knee Extension 5/5    Left Knee Flexion 4/5    Left Knee Extension 5/5      Palpation   Palpation comment L sacral ala more anterior, TTP. Also TTP L posterior hip (piroformis)      Ambulation/Gait   Gait Comments antalgic, decreased stance L LE intially. However, increased walking increases R LE heaviness, causing decreased stance R LE and antalgic pattern R LE. increased lumbar rotation observed. B hip ER L > R.                        Objective measurements completed on examination: See above findings.       No latex allergies  Blood pressure controlled per pt.   Movement preference at L3/L4  Decreased L posterior hip/sciatic pain with R lateral shift correction    Long sit test: no change in leg length Supine posture: L anterior pelvic nutation Limited R hip IR in 90/90 position  Feels better in R S/L   Therapeutic exercise  Seated hip extension isometrics  R 10x5 seconds  Standing  R lateral shift correction at wall  10x5 seconds  Increased L low back/poisterior hip symptoms.   Improved exercise technique, movement at target joints, use of target muscles after mod verbal, visual, tactile cues.     Response to treatment 0/10 L posterior hip, 5/10 R low back pain after session.     Clinical impression Pt is a 51 year old female who came to physical therapy secondary to difficulty with gait and balance. She also presents with altered gait pattern and posture, low back pain with L LE symptoms, bilateral hip weakness, reproduction of low back symptoms with lumbar flexion, as well as R and L side bending, decreased L foot paresthesia with lumbar extension, and difficulty performing tasks which involve prolonged standing and bending over. Pt will benefit from skilled physical therapy services to address the aforementioned deficits.                   PT Education - 06/03/22 1217     Education Details Ther-ex, POC    Person(s) Educated Patient    Methods Explanation;Demonstration;Tactile cues;Verbal cues    Comprehension Verbalized understanding;Returned demonstration              PT Short Term Goals - 06/03/22 1205       PT SHORT TERM GOAL #1   Title Pt will be independent with her initial HEP to decrease back pain, improve strength, balance, and function.    Baseline Pt has not yet started her HEP (06/03/2022)    Time 3    Period Weeks    Status New    Target Date 06/24/22               PT Long Term Goals - 06/03/22 1206       PT LONG TERM GOAL #1   Title Pt will have a decrease in L PSIS/posterior hip pain to 4/10 or less at worst to promote ability to perform tasks which involve prolonged standing and bending over more comfortably.    Baseline 10/10 at worst for the past 3 months (06/03/2022)    Time 8    Period Weeks    Status New    Target Date 07/29/22      PT LONG TERM GOAL #2   Title Pt will have a decrease in low back (horizontal band) pain to 3/10 or less at  worst to promote ability to perform tasks which involve prolonged standing and bending over more comfortably.    Baseline 9/10 at worst for the past 3 months (06/03/2022)    Time 8    Period Weeks    Status New    Target Date 07/29/22      PT LONG TERM GOAL #3   Title Pt will improve her FOTO score by at least 10 points as a demonstration improved function.    Baseline Email of FOTO sent to pt (06/03/2022)    Time 8    Period Weeks    Status New    Target Date 07/29/22      PT LONG TERM GOAL #4   Title Pt will improve her bilateral hip extension and abduction strength by at least 1/2 MMT grade to promote ability to perform standing tasks more comfortably for her back.    Baseline Hip extension 3+/5 R, 4-/5 L, hip abduction 4/5 R, and L (06/03/2022)    Time 8    Period Weeks    Status New    Target Date 07/29/22  Plan - 06/03/22 1200     Clinical Impression Statement Pt is a 51 year old female who came to physical therapy secondary to difficulty with gait and balance. She also presents with altered gait pattern and posture, low back pain with L LE symptoms, bilateral hip weakness, reproduction of low back symptoms with lumbar flexion, as well as R and L side bending, decreased L foot paresthesia with lumbar extension, and difficulty performing tasks which involve prolonged standing and bending over. Pt will benefit from skilled physical therapy services to address the aforementioned deficits.    Personal Factors and Comorbidities Comorbidity 3+;Past/Current Experience;Time since onset of injury/illness/exacerbation    Comorbidities MS, HTN, insomnia, muscle spasm    Examination-Activity Limitations Stand;Bend;Lift;Locomotion Level    Stability/Clinical Decision Making Evolving/Moderate complexity   symptoms seem to be worsening based on subjective reports   Clinical Decision Making Moderate    Rehab Potential Fair    PT Frequency 2x / week    PT Duration 8  weeks    PT Treatment/Interventions Therapeutic exercise;Neuromuscular re-education;Therapeutic activities;Functional mobility training;Aquatic Therapy;Electrical Stimulation;Iontophoresis '4mg'$ /ml Dexamethasone;Gait training;Patient/family education;Manual techniques;Dry needling    PT Next Visit Plan Posture, trunk and glute strength, manual techniques, modalities PRN    Consulted and Agree with Plan of Care Patient             Patient will benefit from skilled therapeutic intervention in order to improve the following deficits and impairments:  Pain, Postural dysfunction, Improper body mechanics, Decreased strength  Visit Diagnosis: Other low back pain - Plan: PT plan of care cert/re-cert  Muscle weakness (generalized) - Plan: PT plan of care cert/re-cert  Difficulty in walking, not elsewhere classified - Plan: PT plan of care cert/re-cert  Radiculopathy, lumbosacral region - Plan: PT plan of care cert/re-cert     Problem List 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 07/16/2020   Low TSH level 09/19/2019   Obesity (BMI 30-39.9) 12/06/2017   Insulin resistance 09/27/2017   Essential hypertension 09/21/2017   Right sided sciatica 09/21/2017   Major depression, recurrent ([place]) 05/24/2017   Migraine without aura, not intractable 12/09/2016   Nickel allergy 12/09/2016   GERD without esophagitis 12/09/2016   Other neutropenia ([place]) 12/09/2016   Dyslipidemia 12/09/2016   Diverticulosis 12/09/2016   Proctitis 12/09/2016   Allergic rhinitis, seasonal 12/09/2016   Chronic constipation 12/09/2016   Vitamin D deficiency 08/06/2013   MS (multiple sclerosis) ([place]) 04/17/2013   Joneen Boers PT, DPT  06/03/2022, 12:28 PM  [facility] [address] [place], [address] Phone: [phone]   Fax:  [phone]  Name: Tammie Lopez MRN: [id number] Date of Birth: 12-Sep-1971

## 2022-06-07 ENCOUNTER — Ambulatory Visit: Payer: Medicare Other

## 2022-06-07 DIAGNOSIS — M6281 Muscle weakness (generalized): Secondary | ICD-10-CM | POA: Diagnosis not present

## 2022-06-07 DIAGNOSIS — R262 Difficulty in walking, not elsewhere classified: Secondary | ICD-10-CM | POA: Diagnosis not present

## 2022-06-07 DIAGNOSIS — M5459 Other low back pain: Secondary | ICD-10-CM

## 2022-06-07 DIAGNOSIS — M5417 Radiculopathy, lumbosacral region: Secondary | ICD-10-CM | POA: Diagnosis not present

## 2022-06-07 NOTE — Therapy (Signed)
OUTPATIENT PHYSICAL THERAPY TREATMENT NOTE   Patient Name: Tammie Lopez MRN: [id number] DOB:[birth year], 51 y.o., female 53 Date: 06/07/2022  PCP: Steele Sizer, MD REFERRING PROVIDER: Concepcion Living, MD   PT End of Session - 06/07/22 1634     Visit Number 2    Number of Visits 17    Date for PT Re-Evaluation 07/29/22    Authorization Type 2    Authorization Time Period of 10 progress report    PT Start Time 1635    PT Stop Time 1715    PT Time Calculation (min) 40 min    Activity Tolerance Patient tolerated treatment well    Behavior During Therapy WFL for tasks assessed/performed             Past Medical History:  Diagnosis Date   Allergy    Constipation    Diverticula of colon    Family history of adverse reaction to anesthesia    mother's BP increased   Hypertension    Insomnia    Migraine    migraines   MS (multiple sclerosis) ([place])    Muscle spasm    Past Surgical History:  Procedure Laterality Date   BREAST BIOPSY Left 01/06/2018    2 areas  neg   BUNIONECTOMY     CESAREAN SECTION N/A 2006   COLONOSCOPY WITH PROPOFOL N/A 02/09/2021   Procedure: COLONOSCOPY WITH PROPOFOL;  Surgeon: [name], MD;  Location: [place];  Service: Endoscopy;  Laterality: N/A;   GANGLION CYST EXCISION Bilateral    Hands   POLYPECTOMY N/A 02/09/2021   Procedure: POLYPECTOMY;  Surgeon: [name], MD;  Location: [place];  Service: Endoscopy;  Laterality: N/A;   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 07/16/2020   Low TSH level 09/19/2019   Obesity (BMI 30-39.9) 12/06/2017   Insulin resistance 09/27/2017   Essential hypertension 09/21/2017   Right sided sciatica 09/21/2017   Major depression, recurrent ([place]) 05/24/2017   Migraine without aura, not  intractable 12/09/2016   Nickel allergy 12/09/2016   GERD without esophagitis 12/09/2016   Other neutropenia ([place]) 12/09/2016   Dyslipidemia 12/09/2016   Diverticulosis 12/09/2016   Proctitis 12/09/2016   Allergic rhinitis, seasonal 12/09/2016   Chronic constipation 12/09/2016   Vitamin D deficiency 08/06/2013   MS (multiple sclerosis) ([place]) 04/17/2013    REFERRING DIAG: MS/Balance, gait  THERAPY DIAG:  Other low back pain  Muscle weakness (generalized)  Difficulty in walking, not elsewhere classified  Radiculopathy, lumbosacral region  Rationale for Evaluation and Treatment Rehabilitation  PERTINENT HISTORY: MS/Balance, gait. Diagnosed in 2012 with MS. Has had low back pain since 2 years ago. Has not yet had PT for back pain, went to chiropractor which helped. No change R LE strenght. Pt states if her low back is not hurting, her R LE does not feel as weak. Pt also states feeling L LE sciatic nerve symptoms (L posterior hip). Uses the Be ACtive + L leg which helps with pain. Pt also feels numbeness L toes dorsal side. Feels like her L lateral trunk feels swollen, which is the area her MS is dominant on. Was taking Plegridy for MS which was making her muscles even weaker and hurt more. Currently not taking medications as a cool down period. Will see Dr. Marianne Sofia  in October 2023. Has had L posterior hip/Sciatic symptoms since about 1 .5 years ago, started when pt was taking Plegrity last January 2022. Pt states that she feels like her back pain makes her balance worse.  PRECAUTIONS: No known precautions  SUBJECTIVE: Back is ok. Slept on her back last night which usually feels better.   PAIN:  Are you having pain? 3.5/10 low back pain currently.      No latex allergies  Blood pressure controlled per pt.    Movement preference at L3/L4   Decreased L posterior hip/sciatic pain with R lateral shift correction    Forward neck, B protracted shoulders, R shoulder higher, R  lateral shift    Long sit test: no change in leg length Supine posture: L anterior pelvic nutation Limited R hip IR in 90/90 position   Feels better in R S/L     TODAY'S TREATMENT:  Therapeutic exercise   Seated manually resisted L lateral shift isometrics in neutral 10x5 seconds for 3 sets  Prone position (forehead on forearms)  with deep breaths x 2 minutes to promote gentle extension   Prone glute max set 10x5 seconds each LE for 3 sets  Decreased L toe numbness  Hooklying lower trunk rotation 10x each side  Hooklying   Hip extension isometrics, leg straight    R 10x5 seconds for 3 sets   L 10x5 seconds for 3 sets  R S/L hip abduction 10x3  Quadruped glute max extension   R 10x2  L 10x2  Regular planks 10 seconds x 5    Side stepping 32 ft to the R and 32 ft to the L to  promote glute med strengthening.     Improved exercise technique, movement at target joints, use of target muscles after mod verbal, visual, tactile cues.        Response to treatment Pt tolerated session well without aggravation of symptoms. Decreased L foot numbness with gentle lumbar extension.        Clinical impression Worked on gentle lumbar extension to decrease L foot numbness. Worked on posture, trunk and glute strength to decrease stress to low back and improve balance. Pt will benefit from continued skilled physical therapy services to decrease pain, improve strength and function.        PATIENT EDUCATION: Education details: therapeutic exercise, HEP Person educated: Patient Education method: Explanation, Demonstration, Tactile cues, Verbal cues, and Handouts Education comprehension: verbalized understanding and returned demonstration   HOME EXERCISE PROGRAM: Access Code: [email or web address] URL: [email or web address] Date: 06/07/2022 Prepared by: Joneen Boers  Exercises - Prone Quadriceps Set  - 1 x daily - 7 x weekly - 3 sets - 10 reps - 5 seconds hold       PT Short Term Goals - 06/03/22 1205       PT SHORT TERM GOAL #1   Title Pt will be independent with her initial HEP to decrease back pain, improve strength, balance, and function.    Baseline Pt has not yet started her HEP (06/03/2022)    Time 3    Period Weeks    Status New    Target Date 06/24/22              PT Long Term Goals - 06/03/22 1206       PT LONG TERM GOAL #1   Title Pt will have a decrease in L PSIS/posterior hip pain to 4/10 or less at worst to promote ability to perform tasks which involve prolonged standing  and bending over more comfortably.    Baseline 10/10 at worst for the past 3 months (06/03/2022)    Time 8    Period Weeks    Status New    Target Date 07/29/22      PT LONG TERM GOAL #2   Title Pt will have a decrease in low back (horizontal band) pain to 3/10 or less at worst to promote ability to perform tasks which involve prolonged standing and bending over more comfortably.    Baseline 9/10 at worst for the past 3 months (06/03/2022)    Time 8    Period Weeks    Status New    Target Date 07/29/22      PT LONG TERM GOAL #3   Title Pt will improve her FOTO score by at least 10 points as a demonstration improved function.    Baseline Email of FOTO sent to pt (06/03/2022)    Time 8    Period Weeks    Status New    Target Date 07/29/22      PT LONG TERM GOAL #4   Title Pt will improve her bilateral hip extension and abduction strength by at least 1/2 MMT grade to promote ability to perform standing tasks more comfortably for her back.    Baseline Hip extension 3+/5 R, 4-/5 L, hip abduction 4/5 R, and L (06/03/2022)    Time 8    Period Weeks    Status New    Target Date 07/29/22              Plan - 06/07/22 1643     Clinical Impression Statement Worked on gentle lumbar extension to decrease L foot numbness. Worked on posture, trunk and glute strength to decrease stress to low back and improve balance. Pt will benefit from continued skilled  physical therapy services to decrease pain, improve strength and function.    Personal Factors and Comorbidities Comorbidity 3+;Past/Current Experience;Time since onset of injury/illness/exacerbation    Comorbidities MS, HTN, insomnia, muscle spasm    Examination-Activity Limitations Stand;Bend;Lift;Locomotion Level    Stability/Clinical Decision Making Evolving/Moderate complexity   symptoms seem to be worsening based on subjective reports   Rehab Potential Fair    PT Frequency 2x / week    PT Duration 8 weeks    PT Treatment/Interventions Therapeutic exercise;Neuromuscular re-education;Therapeutic activities;Functional mobility training;Aquatic Therapy;Electrical Stimulation;Iontophoresis '4mg'$ /ml Dexamethasone;Gait training;Patient/family education;Manual techniques;Dry needling    PT Next Visit Plan Posture, trunk and glute strength, manual techniques, modalities PRN    PT Home Exercise Plan Medbridge Access Code: [email or web address]    Consulted and Agree with Plan of Care Patient              Joneen Boers PT, DPT  06/07/2022, 8:36 PM

## 2022-06-09 ENCOUNTER — Ambulatory Visit: Payer: Medicare Other

## 2022-06-09 DIAGNOSIS — Z87828 Personal history of other (healed) physical injury and trauma: Secondary | ICD-10-CM | POA: Diagnosis not present

## 2022-06-09 DIAGNOSIS — M6281 Muscle weakness (generalized): Secondary | ICD-10-CM

## 2022-06-09 DIAGNOSIS — M5459 Other low back pain: Secondary | ICD-10-CM | POA: Diagnosis not present

## 2022-06-09 DIAGNOSIS — M5417 Radiculopathy, lumbosacral region: Secondary | ICD-10-CM

## 2022-06-09 DIAGNOSIS — R262 Difficulty in walking, not elsewhere classified: Secondary | ICD-10-CM | POA: Diagnosis not present

## 2022-06-09 DIAGNOSIS — G8929 Other chronic pain: Secondary | ICD-10-CM | POA: Diagnosis not present

## 2022-06-09 DIAGNOSIS — S93491A Sprain of other ligament of right ankle, initial encounter: Secondary | ICD-10-CM | POA: Diagnosis not present

## 2022-06-09 DIAGNOSIS — M25571 Pain in right ankle and joints of right foot: Secondary | ICD-10-CM | POA: Diagnosis not present

## 2022-06-09 NOTE — Therapy (Signed)
OUTPATIENT PHYSICAL THERAPY TREATMENT NOTE   Patient Name: Tammie Lopez MRN: [id number] DOB:Aug 15, 1971, 51 y.o., female Today's Date: 06/09/2022  PCP: Steele Sizer, MD REFERRING PROVIDER: Concepcion Living, MD   PT End of Session - 06/09/22 1459     Visit Number 3    Number of Visits 17    Date for PT Re-Evaluation 07/29/22    Authorization Type 3    Authorization Time Period of 10 progress report    PT Start Time 1459    PT Stop Time 1541    PT Time Calculation (min) 42 min    Activity Tolerance Patient tolerated treatment well    Behavior During Therapy WFL for tasks assessed/performed              Past Medical History:  Diagnosis Date   Allergy    Constipation    Diverticula of colon    Family history of adverse reaction to anesthesia    mother's BP increased   Hypertension    Insomnia    Migraine    migraines   MS (multiple sclerosis) ([place])    Muscle spasm    Past Surgical History:  Procedure Laterality Date   BREAST BIOPSY Left 01/06/2018    2 areas  neg   BUNIONECTOMY     CESAREAN SECTION N/A 2006   COLONOSCOPY WITH PROPOFOL N/A 02/09/2021   Procedure: COLONOSCOPY WITH PROPOFOL;  Surgeon: [name], MD;  Location: [facility];  Service: Endoscopy;  Laterality: N/A;   GANGLION CYST EXCISION Bilateral    Hands   POLYPECTOMY N/A 02/09/2021   Procedure: POLYPECTOMY;  Surgeon: [name], MD;  Location: [facility];  Service: Endoscopy;  Laterality: N/A;   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 07/16/2020   Low TSH level 09/19/2019   Obesity (BMI 30-39.9) 12/06/2017   Insulin resistance 09/27/2017   Essential hypertension 09/21/2017   Right sided sciatica 09/21/2017   Major depression, recurrent ([place]) 05/24/2017   Migraine without aura,  not intractable 12/09/2016   Nickel allergy 12/09/2016   GERD without esophagitis 12/09/2016   Other neutropenia ([place]) 12/09/2016   Dyslipidemia 12/09/2016   Diverticulosis 12/09/2016   Proctitis 12/09/2016   Allergic rhinitis, seasonal 12/09/2016   Chronic constipation 12/09/2016   Vitamin D deficiency 08/06/2013   MS (multiple sclerosis) ([place]) 04/17/2013    REFERRING DIAG: MS/Balance, gait  THERAPY DIAG:  Other low back pain  Muscle weakness (generalized)  Difficulty in walking, not elsewhere classified  Radiculopathy, lumbosacral region  Rationale for Evaluation and Treatment Rehabilitation  PERTINENT HISTORY: MS/Balance, gait. Diagnosed in 2012 with MS. Has had low back pain since 2 years ago. Has not yet had PT for back pain, went to chiropractor which helped. No change R LE strenght. Pt states if her low back is not hurting, her R LE does not feel as weak. Pt also states feeling L LE sciatic nerve symptoms (L posterior hip). Uses the Be ACtive + L leg which helps with pain. Pt also feels numbeness L toes dorsal side. Feels like her L lateral trunk feels swollen, which is the area her MS is dominant on. Was taking Plegridy for MS which was making her muscles even weaker and hurt more. Currently not taking medications as a cool down period. Will see Dr. Larna Daughters  [place] in October 2023. Has had L posterior hip/Sciatic symptoms since about 1 .5 years ago, started when pt was taking Plegrity last January 2022. Pt states that she feels like her back pain makes her balance worse.  PRECAUTIONS: No known precautions  SUBJECTIVE: Went to the gym after last session. L low back felt better after last session. Has R anterior lateral ankle pain. Back is still a little achy, about a 3/10. Feels a pull L posterior lateral trunk.    PAIN:  Are you having pain? 3.5/10 low back pain currently.      No latex allergies  Blood pressure controlled per pt.    Movement preference at L3/L4    Decreased L posterior hip/sciatic pain with R lateral shift correction    Forward neck, B protracted shoulders, R shoulder higher, R lateral shift    Long sit test: no change in leg length Supine posture: L anterior pelvic nutation Limited R hip IR in 90/90 position   Feels better in R S/L     TODAY'S TREATMENT:  Therapeutic exercise   Seated L to R pressure to L lateral thoracic convexity with L trunk side bend 9x5 seconds   Then with L P to A pressure to L mid thoracic spine with L trunk extension and rotation. 10x5 seconds. Feel better  Sitting with towel roll L thoracic spine, and gentle trunk extension 1 min x 3  Standing B scapular retraction red band 10x3 with 5 seconds   Decreased thoracic pull  Supine B scapular retraction with towel roll behind thoracic spine. 10x5 seconds for 2 sets   Limited R hip IR compared to L.  Supine R piriformis stretch (R knee to L shoulder) 1 minute x 2  Bridge 10x3  Forward step up onto [employer] Ex pad with one UE assist   R LE 10x       Improved exercise technique, movement at target joints, use of target muscles after mod verbal, visual, tactile cues.        Response to treatment Pt tolerated session well without aggravation of symptoms. No pain in back after session. Just the pulling.      Clinical impression Worked on thoracic extension to decrease L trunk pulling sensation. Worked on improving hip IR ROM as well as glute strengthening to decrease stress to low back and improve balance. Pt will benefit from continued skilled physical therapy services to decrease pain, improve strength and function.        PATIENT EDUCATION: Education details: therapeutic exercise, HEP Person educated: Patient Education method: Explanation, Demonstration, Tactile cues, Verbal cues, and Handouts Education comprehension: verbalized understanding and returned demonstration   HOME EXERCISE PROGRAM: Access Code: [email or web address] URL:  [email or web address] Date: 06/07/2022 Prepared by: Joneen Boers  Exercises - Prone Quadriceps Set  - 1 x daily - 7 x weekly - 3 sets - 10 reps - 5 seconds hold - Scapular Retraction with Resistance  - 1 x daily - 7 x weekly - 3 sets - 10 reps - 5 seconds hold  Red band.  Sitting with towel roll L thoracic spine, and gentle trunk extension 1 min x 3   - Supine Piriformis Stretch  - 2 x daily - 7 x weekly - 1 sets - 3 reps - 30 seconds to 1 minute hold  - Supine Bridge  - 1 x daily - 7 x weekly - 2 sets - 10 reps     PT Short Term Goals - 06/03/22 1205  PT SHORT TERM GOAL #1   Title Pt will be independent with her initial HEP to decrease back pain, improve strength, balance, and function.    Baseline Pt has not yet started her HEP (06/03/2022)    Time 3    Period Weeks    Status New    Target Date 06/24/22              PT Long Term Goals - 06/03/22 1206       PT LONG TERM GOAL #1   Title Pt will have a decrease in L PSIS/posterior hip pain to 4/10 or less at worst to promote ability to perform tasks which involve prolonged standing and bending over more comfortably.    Baseline 10/10 at worst for the past 3 months (06/03/2022)    Time 8    Period Weeks    Status New    Target Date 07/29/22      PT LONG TERM GOAL #2   Title Pt will have a decrease in low back (horizontal band) pain to 3/10 or less at worst to promote ability to perform tasks which involve prolonged standing and bending over more comfortably.    Baseline 9/10 at worst for the past 3 months (06/03/2022)    Time 8    Period Weeks    Status New    Target Date 07/29/22      PT LONG TERM GOAL #3   Title Pt will improve her FOTO score by at least 10 points as a demonstration improved function.    Baseline Email of FOTO sent to pt (06/03/2022)    Time 8    Period Weeks    Status New    Target Date 07/29/22      PT LONG TERM GOAL #4   Title Pt will improve her bilateral hip extension and  abduction strength by at least 1/2 MMT grade to promote ability to perform standing tasks more comfortably for her back.    Baseline Hip extension 3+/5 R, 4-/5 L, hip abduction 4/5 R, and L (06/03/2022)    Time 8    Period Weeks    Status New    Target Date 07/29/22              Plan - 06/09/22 1459     Clinical Impression Statement Worked on thoracic extension to decrease L trunk pulling sensation. Worked on improving hip IR ROM as well as glute strengthening to decrease stress to low back and improve balance. Pt will benefit from continued skilled physical therapy services to decrease pain, improve strength and function.    Personal Factors and Comorbidities Comorbidity 3+;Past/Current Experience;Time since onset of injury/illness/exacerbation    Comorbidities MS, HTN, insomnia, muscle spasm    Examination-Activity Limitations Stand;Bend;Lift;Locomotion Level    Stability/Clinical Decision Making Evolving/Moderate complexity   symptoms seem to be worsening based on subjective reports   Clinical Decision Making Low    Rehab Potential Fair    PT Frequency 2x / week    PT Duration 8 weeks    PT Treatment/Interventions Therapeutic exercise;Neuromuscular re-education;Therapeutic activities;Functional mobility training;Aquatic Therapy;Electrical Stimulation;Iontophoresis '4mg'$ /ml Dexamethasone;Gait training;Patient/family education;Manual techniques;Dry needling    PT Next Visit Plan Posture, trunk and glute strength, manual techniques, modalities PRN    PT Home Exercise Plan Medbridge Access Code: [email or web address]    Consulted and Agree with Plan of Care Patient               Joneen Boers PT, DPT  06/09/2022,  6:32 PM

## 2022-06-14 ENCOUNTER — Ambulatory Visit: Payer: Medicare Other

## 2022-06-14 DIAGNOSIS — R262 Difficulty in walking, not elsewhere classified: Secondary | ICD-10-CM

## 2022-06-14 DIAGNOSIS — M5417 Radiculopathy, lumbosacral region: Secondary | ICD-10-CM

## 2022-06-14 DIAGNOSIS — M5459 Other low back pain: Secondary | ICD-10-CM | POA: Diagnosis not present

## 2022-06-14 DIAGNOSIS — M6281 Muscle weakness (generalized): Secondary | ICD-10-CM | POA: Diagnosis not present

## 2022-06-14 NOTE — Therapy (Signed)
OUTPATIENT PHYSICAL THERAPY TREATMENT NOTE   Patient Name: Tammie Lopez MRN: [id number] DOB:Apr 20, 1971, 51 y.o., female 89 Date: 06/14/2022  PCP: Steele Sizer, MD REFERRING PROVIDER: Concepcion Living, MD   PT End of Session - 06/14/22 1522     Visit Number 4    Number of Visits 17    Date for PT Re-Evaluation 07/29/22    Authorization Type [employer]BS  [employer] [place] Medicaid    Authorization Time Period 06/03/22-07/29/22    Progress Note Due on Visit 10    PT Start Time 1506    PT Stop Time 8101    PT Time Calculation (min) 38 min    Activity Tolerance Patient tolerated treatment well;No increased pain    Behavior During Therapy WFL for tasks assessed/performed              Past Medical History:  Diagnosis Date   Allergy    Constipation    Diverticula of colon    Family history of adverse reaction to anesthesia    mother's BP increased   Hypertension    Insomnia    Migraine    migraines   MS (multiple sclerosis) ([facility])    Muscle spasm    Past Surgical History:  Procedure Laterality Date   BREAST BIOPSY Left 01/06/2018    2 areas  neg   BUNIONECTOMY     CESAREAN SECTION N/A 2006   COLONOSCOPY WITH PROPOFOL N/A 02/09/2021   Procedure: COLONOSCOPY WITH PROPOFOL;  Surgeon: [name], MD;  Location: [facility];  Service: Endoscopy;  Laterality: N/A;   GANGLION CYST EXCISION Bilateral    Hands   POLYPECTOMY N/A 02/09/2021   Procedure: POLYPECTOMY;  Surgeon: [name], MD;  Location: [facility];  Service: Endoscopy;  Laterality: N/A;   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 07/16/2020   Low TSH level 09/19/2019   Obesity (BMI 30-39.9) 12/06/2017   Insulin resistance 09/27/2017   Essential hypertension 09/21/2017   Right sided sciatica 09/21/2017    Major depression, recurrent ([place]) 05/24/2017   Migraine without aura, not intractable 12/09/2016   Nickel allergy 12/09/2016   GERD without esophagitis 12/09/2016   Other neutropenia ([place]) 12/09/2016   Dyslipidemia 12/09/2016   Diverticulosis 12/09/2016   Proctitis 12/09/2016   Allergic rhinitis, seasonal 12/09/2016   Chronic constipation 12/09/2016   Vitamin D deficiency 08/06/2013   MS (multiple sclerosis) ([place]) 04/17/2013    REFERRING DIAG: MS/Balance, gait  THERAPY DIAG:  Other low back pain  Muscle weakness (generalized)  Difficulty in walking, not elsewhere classified  Radiculopathy, lumbosacral region  Rationale for Evaluation and Treatment Rehabilitation  PERTINENT HISTORY: MS/Balance, gait. Diagnosed in 2012 with MS. Has had low back pain since 2 years ago. Has not yet had PT for back pain, went to chiropractor which helped. No change R LE strenght. Pt states if her low back is not hurting, her R LE does not feel as weak. Pt also states feeling L LE sciatic nerve symptoms (L posterior hip). Uses the Be ACtive + L leg which helps with pain. Pt also feels numbeness L toes dorsal side. Feels like her L lateral trunk feels swollen, which is the area her MS is dominant on. Was taking Plegridy for MS which was making her muscles even weaker and hurt more. Currently  not taking medications as a cool down period. Will see Dr. Marianne Sofia in October 2023. Has had L posterior hip/Sciatic symptoms since about 1 .5 years ago, started when pt was taking Plegrity last January 2022. Pt states that she feels like her back pain makes her balance worse.  PRECAUTIONS: No known precautions  Pt feeling better today, still has left flank pulling   PAIN:  Are you having pain? 3.5/10 low back pain currently.   INTERVENTION THIS DATE: Regular planks 10x10 seconds   Quadruped LE extension 1x10 bilat Quadruped "fire hydrants" horizontal hip ABDCT 1x10 bilat   Hooklying lower trunk rotation  15x each side, pillow to set neutral joint space and avoid FADDIR  MFR Left lateral obliques (external/internal) and intercostals   -Glute Max bridge 1x12 -Dynadisc sitting marching 1x30 alternating, trunk rotation in upright extension/retraction 1x20 alternating   -SLS alternating sides 1x10 bilat 5secH     Improved exercise technique, movement at target joints, use of target muscles after mod verbal, visual, tactile cues.          PATIENT EDUCATION: Education details: therapeutic exercise, HEP Person educated: Patient Education method: Explanation, Demonstration, Tactile cues, Verbal cues, and Handouts Education comprehension: verbalized understanding and returned demonstration   HOME EXERCISE PROGRAM: Access Code: [email or web address] URL: [email or web address] Date: 06/07/2022 Prepared by: Joneen Boers  Exercises - Prone Quadriceps Set  - 1 x daily - 7 x weekly - 3 sets - 10 reps - 5 seconds hold - Scapular Retraction with Resistance  - 1 x daily - 7 x weekly - 3 sets - 10 reps - 5 seconds hold  Red band.  Sitting with towel roll L thoracic spine, and gentle trunk extension 1 min x 3   - Supine Piriformis Stretch  - 2 x daily - 7 x weekly - 1 sets - 3 reps - 30 seconds to 1 minute hold  - Supine Bridge  - 1 x daily - 7 x weekly - 2 sets - 10 reps     PT Short Term Goals - 06/03/22 1205       PT SHORT TERM GOAL #1   Title Pt will be independent with her initial HEP to decrease back pain, improve strength, balance, and function.    Baseline Pt has not yet started her HEP (06/03/2022)    Time 3    Period Weeks    Status New    Target Date 06/24/22              PT Long Term Goals - 06/03/22 1206       PT LONG TERM GOAL #1   Title Pt will have a decrease in L PSIS/posterior hip pain to 4/10 or less at worst to promote ability to perform tasks which involve prolonged standing and bending over more comfortably.    Baseline 10/10 at worst for the past 3  months (06/03/2022)    Time 8    Period Weeks    Status New    Target Date 07/29/22      PT LONG TERM GOAL #2   Title Pt will have a decrease in low back (horizontal band) pain to 3/10 or less at worst to promote ability to perform tasks which involve prolonged standing and bending over more comfortably.    Baseline 9/10 at worst for the past 3 months (06/03/2022)    Time 8    Period Weeks    Status New    Target Date 07/29/22  PT LONG TERM GOAL #3   Title Pt will improve her FOTO score by at least 10 points as a demonstration improved function.    Baseline Email of FOTO sent to pt (06/03/2022)    Time 8    Period Weeks    Status New    Target Date 07/29/22      PT LONG TERM GOAL #4   Title Pt will improve her bilateral hip extension and abduction strength by at least 1/2 MMT grade to promote ability to perform standing tasks more comfortably for her back.    Baseline Hip extension 3+/5 R, 4-/5 L, hip abduction 4/5 R, and L (06/03/2022)    Time 8    Period Weeks    Status New    Target Date 07/29/22              Plan - 06/14/22 1539     Clinical Impression Statement Pain well controlled. Focus on core strneght and motor control. Pt has excellent response to myofasial release of Left abdominals and intercostals. Pt progressing well toward goals of treatment.    Personal Factors and Comorbidities Comorbidity 3+;Past/Current Experience;Time since onset of injury/illness/exacerbation    Comorbidities MS, HTN, insomnia, muscle spasm    Examination-Activity Limitations Stand;Bend;Lift;Locomotion Level    Stability/Clinical Decision Making Evolving/Moderate complexity    Clinical Decision Making Low    Rehab Potential Fair    PT Frequency 2x / week    PT Duration 8 weeks    PT Treatment/Interventions Therapeutic exercise;Neuromuscular re-education;Therapeutic activities;Functional mobility training;Aquatic Therapy;Electrical Stimulation;Iontophoresis '4mg'$ /ml Dexamethasone;Gait  training;Patient/family education;Manual techniques;Dry needling    PT Next Visit Plan Posture, trunk and glute strength, manual techniques, modalities PRN    PT Home Exercise Plan Medbridge Access Code: [email or web address]    Consulted and Agree with Plan of Care Patient              3:47 PM, 06/14/22     3:47 PM, 06/14/22 Etta Grandchild, PT, DPT Physical Therapist - [facility] [phone] (Office)  06/14/2022, 3:47 PM

## 2022-06-17 ENCOUNTER — Ambulatory Visit: Payer: Medicare Other

## 2022-06-17 DIAGNOSIS — M5417 Radiculopathy, lumbosacral region: Secondary | ICD-10-CM | POA: Diagnosis not present

## 2022-06-17 DIAGNOSIS — R262 Difficulty in walking, not elsewhere classified: Secondary | ICD-10-CM | POA: Diagnosis not present

## 2022-06-17 DIAGNOSIS — M5459 Other low back pain: Secondary | ICD-10-CM | POA: Diagnosis not present

## 2022-06-17 DIAGNOSIS — M6281 Muscle weakness (generalized): Secondary | ICD-10-CM
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
## 2022-06-21 ENCOUNTER — Ambulatory Visit: Payer: Medicare Other

## 2022-06-24 ENCOUNTER — Ambulatory Visit: Payer: Medicare Other | Admitting: Family Medicine

## 2022-06-28 ENCOUNTER — Ambulatory Visit: Payer: Medicare Other

## 2022-07-01 ENCOUNTER — Ambulatory Visit: Payer: Medicare Other | Attending: Neurology

## 2022-07-01 DIAGNOSIS — M5459 Other low back pain: Secondary | ICD-10-CM | POA: Diagnosis not present

## 2022-07-01 DIAGNOSIS — M6281 Muscle weakness (generalized): Secondary | ICD-10-CM | POA: Diagnosis not present

## 2022-07-01 DIAGNOSIS — R262 Difficulty in walking, not elsewhere classified: Secondary | ICD-10-CM | POA: Insufficient documentation

## 2022-07-01 DIAGNOSIS — M5417 Radiculopathy, lumbosacral region: Secondary | ICD-10-CM | POA: Diagnosis not present

## 2022-07-01 NOTE — Therapy (Signed)
OUTPATIENT PHYSICAL THERAPY TREATMENT NOTE   Patient Name: Tammie Lopez MRN: [id number] DOB:1971-09-03, 51 y.o., female 38 Date: 07/01/2022  PCP: Steele Sizer, MD REFERRING PROVIDER: Concepcion Living, MD   PT End of Session - 07/01/22 1500     Visit Number 6    Number of Visits 17    Date for PT Re-Evaluation 07/29/22    Authorization Type BCBS  [employer] [place] Medicaid    Authorization Time Period 06/03/22-07/29/22    Progress Note Due on Visit 10    PT Start Time 1500    PT Stop Time 1541    PT Time Calculation (min) 41 min    Activity Tolerance Patient tolerated treatment well;No increased pain    Behavior During Therapy WFL for tasks assessed/performed                Past Medical History:  Diagnosis Date   Allergy    Constipation    Diverticula of colon    Family history of adverse reaction to anesthesia    mother's BP increased   Hypertension    Insomnia    Migraine    migraines   MS (multiple sclerosis) (HCC)    Muscle spasm    Past Surgical History:  Procedure Laterality Date   BREAST BIOPSY Left 01/06/2018    2 areas  neg   BUNIONECTOMY     CESAREAN SECTION N/A 2006   COLONOSCOPY WITH PROPOFOL N/A 02/09/2021   Procedure: COLONOSCOPY WITH PROPOFOL;  Surgeon: [name], MD;  Location: [facility];  Service: Endoscopy;  Laterality: N/A;   GANGLION CYST EXCISION Bilateral    Hands   POLYPECTOMY N/A 02/09/2021   Procedure: POLYPECTOMY;  Surgeon: [name], MD;  Location: [facility];  Service: Endoscopy;  Laterality: N/A;   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 07/16/2020   Low TSH level 09/19/2019   Obesity (BMI 30-39.9) 12/06/2017   Insulin resistance 09/27/2017   Essential hypertension 09/21/2017   Right sided sciatica 09/21/2017    Major depression, recurrent ([place]) 05/24/2017   Migraine without aura, not intractable 12/09/2016   Nickel allergy 12/09/2016   GERD without esophagitis 12/09/2016   Other neutropenia ([place]) 12/09/2016   Dyslipidemia 12/09/2016   Diverticulosis 12/09/2016   Proctitis 12/09/2016   Allergic rhinitis, seasonal 12/09/2016   Chronic constipation 12/09/2016   Vitamin D deficiency 08/06/2013   MS (multiple sclerosis) ([place]) 04/17/2013    REFERRING DIAG: MS/Balance, gait  THERAPY DIAG:  Other low back pain  Muscle weakness (generalized)  Difficulty in walking, not elsewhere classified  Radiculopathy, lumbosacral region  Rationale for Evaluation and Treatment Rehabilitation  PERTINENT HISTORY: MS/Balance, gait. Diagnosed in 2012 with MS. Has had low back pain since 2 years ago. Has not yet had PT for back pain, went to chiropractor which helped. No change R LE strenght. Pt states if her low back is not hurting, her R LE does not feel as weak. Pt also states feeling L LE sciatic nerve symptoms (L posterior hip). Uses the Be ACtive + L leg which helps with pain. Pt also feels numbeness L toes dorsal side. Feels like her L lateral trunk feels swollen, which is the area her MS is dominant on. Was taking Plegridy for MS which was making her muscles even weaker and hurt  more. Currently not taking medications as a cool down period. Will see Dr. Marianne Sofia in October 2023. Has had L posterior hip/Sciatic symptoms since about 1 .5 years ago, started when pt was taking Plegrity last January 2022. Pt states that she feels like her back pain makes her balance worse.  PRECAUTIONS: No known precautions  SUBJECTIVE: Back is a little stiff. Has not had a lot of problems with L sciatic nerve. R side still feels weak. Has been doing her HEP.  4/10 horizontal low back pain at most, 2/10 L PSIS pain at most for the past 7 days .      PAIN:  Are you having pain? 2/10 horizontal low back pain currently  (dull and tight). No L PSIS pain currently.       No latex allergies  Blood pressure controlled per pt.    Movement preference at L3/L4   Decreased L posterior hip/sciatic pain with R lateral shift correction    Forward neck, B protracted shoulders, R shoulder higher, R lateral shift    Long sit test: no change in leg length Supine posture: L anterior pelvic nutation Limited R hip IR in 90/90 position   Feels better in R S/L     TODAY'S TREATMENT:  Therapeutic exercise   Seated thoracic extension over chair 10x5 seconds for 3 sets  Side stepping yellow band around ankles 32 ft to the R and 32 ft to the L   Forward wedding march 32 ft x 2  Standing back extension 10x5 seconds for 2 sets  Decreased L foot numbness   SLS with light touch assist PRN  R 10x5 seconds. R anterior lateral ankle discomfort  L 10x5 seconds   Standing B heel toe raises with B UE assist 10x2 each way   Lateral step up onto Air Ex pad with B UE assist   R 10x  Lateral step up onto dyna disc with B UE assist   R 10x3  L 10x3  Forward step up onto Dyna Disc with contralateral UE assist  R 10x  L 10x        Improved exercise technique, movement at target joints, use of target muscles after mod verbal, visual, tactile cues.        Response to treatment Fair tolerance to today's session.    Clinical impression  Worked on glute strengthening to promote ability to ambulate with less difficulty. Continued working on thoracic and lumbar extension secondary to directional preference for L foot paresthesia. Increased low back discomfort with lumbar extension but decreased L foot symptoms. Good muscle use felt with exercises. Fair tolerance to today's session. Pt will benefit from continued skilled physical therapy services to decrease pain, improve strength and function.        PATIENT EDUCATION: Education details: therapeutic exercise, HEP Person educated: Patient Education method:  Explanation, Demonstration, Tactile cues, Verbal cues, and Handouts Education comprehension: verbalized understanding and returned demonstration   HOME EXERCISE PROGRAM: Access Code: [email or web address] URL: [email or web address] Date: 06/07/2022 Prepared by: Joneen Boers  Exercises - Prone Quadriceps Set  - 1 x daily - 7 x weekly - 3 sets - 10 reps - 5 seconds hold - Scapular Retraction with Resistance  - 1 x daily - 7 x weekly - 3 sets - 10 reps - 5 seconds hold  Red band.  Sitting with towel roll L thoracic spine, and gentle trunk extension 1 min x 3   - Supine Piriformis Stretch  - 2 x daily -  7 x weekly - 1 sets - 3 reps - 30 seconds to 1 minute hold  - Supine Bridge  - 1 x daily - 7 x weekly - 2 sets - 10 reps  - Side Stepping with Resistance at Ankles  - 2 x daily - 7 x weekly - 1 sets - 1 reps  Yellow band    PT Short Term Goals - 06/03/22 1205       PT SHORT TERM GOAL #1   Title Pt will be independent with her initial HEP to decrease back pain, improve strength, balance, and function.    Baseline Pt has not yet started her HEP (06/03/2022)    Time 3    Period Weeks    Status New    Target Date 06/24/22              PT Long Term Goals - 06/03/22 1206       PT LONG TERM GOAL #1   Title Pt will have a decrease in L PSIS/posterior hip pain to 4/10 or less at worst to promote ability to perform tasks which involve prolonged standing and bending over more comfortably.    Baseline 10/10 at worst for the past 3 months (06/03/2022)    Time 8    Period Weeks    Status New    Target Date 07/29/22      PT LONG TERM GOAL #2   Title Pt will have a decrease in low back (horizontal band) pain to 3/10 or less at worst to promote ability to perform tasks which involve prolonged standing and bending over more comfortably.    Baseline 9/10 at worst for the past 3 months (06/03/2022)    Time 8    Period Weeks    Status New    Target Date 07/29/22      PT LONG TERM GOAL  #3   Title Pt will improve her FOTO score by at least 10 points as a demonstration improved function.    Baseline Email of FOTO sent to pt (06/03/2022)    Time 8    Period Weeks    Status New    Target Date 07/29/22      PT LONG TERM GOAL #4   Title Pt will improve her bilateral hip extension and abduction strength by at least 1/2 MMT grade to promote ability to perform standing tasks more comfortably for her back.    Baseline Hip extension 3+/5 R, 4-/5 L, hip abduction 4/5 R, and L (06/03/2022)    Time 8    Period Weeks    Status New    Target Date 07/29/22              Plan - 07/01/22 1500     Clinical Impression Statement Worked on glute strengthening to promote ability to ambulate with less difficulty. Continued working on thoracic and lumbar extension secondary to directional preference for L foot paresthesia. Increased low back discomfort with lumbar extension but decreased L foot symptoms. Good muscle use felt with exercises. Fair tolerance to today's session. Pt will benefit from continued skilled physical therapy services to decrease pain, improve strength and function.    Personal Factors and Comorbidities Comorbidity 3+;Past/Current Experience;Time since onset of injury/illness/exacerbation    Comorbidities MS, HTN, insomnia, muscle spasm    Examination-Activity Limitations Stand;Bend;Lift;Locomotion Level    Stability/Clinical Decision Making Evolving/Moderate complexity    Rehab Potential Fair    PT Frequency 2x / week    PT  Duration 8 weeks    PT Treatment/Interventions Therapeutic exercise;Neuromuscular re-education;Therapeutic activities;Functional mobility training;Aquatic Therapy;Electrical Stimulation;Iontophoresis '4mg'$ /ml Dexamethasone;Gait training;Patient/family education;Manual techniques;Dry needling    PT Next Visit Plan Posture, trunk and glute strength, manual techniques, modalities PRN    PT Home Exercise Plan Medbridge Access Code: [email or web address]    Consulted and  Agree with Plan of Care Patient                 Joneen Boers PT, DPT  07/01/2022, 3:43 PM

## 2022-07-05 ENCOUNTER — Ambulatory Visit: Payer: Medicare Other

## 2022-07-08 ENCOUNTER — Ambulatory Visit: Payer: Medicare Other

## 2022-07-08 DIAGNOSIS — M5459 Other low back pain: Secondary | ICD-10-CM

## 2022-07-08 DIAGNOSIS — M6281 Muscle weakness (generalized): Secondary | ICD-10-CM | POA: Diagnosis not present

## 2022-07-08 DIAGNOSIS — R262 Difficulty in walking, not elsewhere classified: Secondary | ICD-10-CM

## 2022-07-08 DIAGNOSIS — M5417 Radiculopathy, lumbosacral region: Secondary | ICD-10-CM

## 2022-07-08 NOTE — Therapy (Signed)
OUTPATIENT PHYSICAL THERAPY TREATMENT NOTE   Patient Name: Tammie Lopez MRN: [id number] DOB:11-14-1971, 51 y.o., female 2 Date: 07/08/2022  PCP: Steele Sizer, MD REFERRING PROVIDER: Concepcion Living, MD   PT End of Session - 07/08/22 1416     Visit Number 7    Number of Visits 17    Date for PT Re-Evaluation 07/29/22    Authorization Type BCBS  [employer] [place] Medicaid    Authorization Time Period 06/03/22-07/29/22    Progress Note Due on Visit 10    PT Start Time 6333    PT Stop Time 1457    PT Time Calculation (min) 40 min    Activity Tolerance Patient tolerated treatment well;No increased pain    Behavior During Therapy WFL for tasks assessed/performed                 Past Medical History:  Diagnosis Date   Allergy    Constipation    Diverticula of colon    Family history of adverse reaction to anesthesia    mother's BP increased   Hypertension    Insomnia    Migraine    migraines   MS (multiple sclerosis) (HCC)    Muscle spasm    Past Surgical History:  Procedure Laterality Date   BREAST BIOPSY Left 01/06/2018    2 areas  neg   BUNIONECTOMY     CESAREAN SECTION N/A 2006   COLONOSCOPY WITH PROPOFOL N/A 02/09/2021   Procedure: COLONOSCOPY WITH PROPOFOL;  Surgeon: [name], MD;  Location: [place];  Service: Endoscopy;  Laterality: N/A;   GANGLION CYST EXCISION Bilateral    Hands   POLYPECTOMY N/A 02/09/2021   Procedure: POLYPECTOMY;  Surgeon: [name], MD;  Location: [place];  Service: Endoscopy;  Laterality: N/A;   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 07/16/2020   Low TSH level 09/19/2019   Obesity (BMI 30-39.9) 12/06/2017   Insulin resistance 09/27/2017   Essential hypertension 09/21/2017   Right sided sciatica  09/21/2017   Major depression, recurrent ([place]) 05/24/2017   Migraine without aura, not intractable 12/09/2016   Nickel allergy 12/09/2016   GERD without esophagitis 12/09/2016   Other neutropenia ([place]) 12/09/2016   Dyslipidemia 12/09/2016   Diverticulosis 12/09/2016   Proctitis 12/09/2016   Allergic rhinitis, seasonal 12/09/2016   Chronic constipation 12/09/2016   Vitamin D deficiency 08/06/2013   MS (multiple sclerosis) ([place]) 04/17/2013    REFERRING DIAG: MS/Balance, gait  THERAPY DIAG:  Other low back pain  Muscle weakness (generalized)  Difficulty in walking, not elsewhere classified  Radiculopathy, lumbosacral region  Rationale for Evaluation and Treatment Rehabilitation  PERTINENT HISTORY: MS/Balance, gait. Diagnosed in 2012 with MS. Has had low back pain since 2 years ago. Has not yet had PT for back pain, went to chiropractor which helped. No change R LE strenght. Pt states if her low back is not hurting, her R LE does not feel as weak. Pt also states feeling L LE sciatic nerve symptoms (L posterior hip). Uses the Be ACtive + L leg which helps with pain. Pt also feels numbeness L toes dorsal side. Feels like her L lateral trunk feels swollen, which is the area her MS is dominant on. Was taking Plegridy for MS which was making her muscles even weaker and  hurt more. Currently not taking medications as a cool down period. Will see Dr. Marianne Sofia in October 2023. Has had L posterior hip/Sciatic symptoms since about 1 .5 years ago, started when pt was taking Plegrity last January 2022. Pt states that she feels like her back pain makes her balance worse.  PRECAUTIONS: No known precautions  SUBJECTIVE: Back is ok. No low back pain currently. Has been doing her standing back extensions which help with the numbness in her toes which is not as bad as it has been. R leg does not feel as weak.     PAIN:  Are you having pain? 0/10 low back pain.       No latex allergies   Blood pressure controlled per pt.    Movement preference at L3/L4   Decreased L posterior hip/sciatic pain with R lateral shift correction    Forward neck, B protracted shoulders, R shoulder higher, R lateral shift    Long sit test: no change in leg length Supine posture: L anterior pelvic nutation Limited R hip IR in 90/90 position   Feels better in R S/L     TODAY'S TREATMENT:  Therapeutic exercise   Seated thoracic extension over chair 10x5 seconds for 3 sets  Lateral step up onto dyna disc with one UE assist   R 10x3  L 10x3  Forward step up onto Dyna Disc with contralateral UE assist  R 10x3  L 10x3  Side stepping yellow band around ankles 32 ft to the R and 32 ft to the L   Forward wedding march 32 ft x   Sitting with upright posture  Gentle manually resisted trunk flexion isometrics 10x5 seconds    Low back discomfort, eases with rest.   Side stepping on Air ex beam 5x each side  Demonstrates L ankle IV in closed chain uneven surface   Seated L ankle EV 10x5 seconds for 3 sets            Improved exercise technique, movement at target joints, use of target muscles after mod verbal, visual, tactile cues.        Response to treatment Pt tolerated session well without aggravation of symptoms.    Clinical impression   Worked on glute, trunk, and ankle strengthening to improve balance and ankle stability as well as to decrease stress to low back in closed chain tasks. Good muscle use felt with exercises. Pt tolerated session well without aggravation of symptoms. Pt will benefit from continued skilled physical therapy services to decrease pain, improve strength and function.        PATIENT EDUCATION: Education details: therapeutic exercise, HEP Person educated: Patient Education method: Explanation, Demonstration, Tactile cues, Verbal cues, and Handouts Education comprehension: verbalized understanding and returned demonstration   HOME  EXERCISE PROGRAM: Access Code: [email or web address] URL: [email or web address] Date: 06/07/2022 Prepared by: Joneen Boers  Exercises - Prone Quadriceps Set  - 1 x daily - 7 x weekly - 3 sets - 10 reps - 5 seconds hold - Scapular Retraction with Resistance  - 1 x daily - 7 x weekly - 3 sets - 10 reps - 5 seconds hold  Red band.  Sitting with towel roll L thoracic spine, and gentle trunk extension 1 min x 3   - Supine Piriformis Stretch  - 2 x daily - 7 x weekly - 1 sets - 3 reps - 30 seconds to 1 minute hold  - Supine Bridge  - 1 x daily - 7 x weekly -  2 sets - 10 reps  - Side Stepping with Resistance at Ankles  - 2 x daily - 7 x weekly - 1 sets - 1 reps  Yellow band  - Seated Ankle Eversion AROM  - 1 x daily - 7 x weekly - 3 sets - 10 reps - 5 seconds hold  PT Short Term Goals - 06/03/22 1205       PT SHORT TERM GOAL #1   Title Pt will be independent with her initial HEP to decrease back pain, improve strength, balance, and function.    Baseline Pt has not yet started her HEP (06/03/2022)    Time 3    Period Weeks    Status New    Target Date 06/24/22              PT Long Term Goals - 06/03/22 1206       PT LONG TERM GOAL #1   Title Pt will have a decrease in L PSIS/posterior hip pain to 4/10 or less at worst to promote ability to perform tasks which involve prolonged standing and bending over more comfortably.    Baseline 10/10 at worst for the past 3 months (06/03/2022)    Time 8    Period Weeks    Status New    Target Date 07/29/22      PT LONG TERM GOAL #2   Title Pt will have a decrease in low back (horizontal band) pain to 3/10 or less at worst to promote ability to perform tasks which involve prolonged standing and bending over more comfortably.    Baseline 9/10 at worst for the past 3 months (06/03/2022)    Time 8    Period Weeks    Status New    Target Date 07/29/22      PT LONG TERM GOAL #3   Title Pt will improve her FOTO score by at least 10 points  as a demonstration improved function.    Baseline Email of FOTO sent to pt (06/03/2022)    Time 8    Period Weeks    Status New    Target Date 07/29/22      PT LONG TERM GOAL #4   Title Pt will improve her bilateral hip extension and abduction strength by at least 1/2 MMT grade to promote ability to perform standing tasks more comfortably for her back.    Baseline Hip extension 3+/5 R, 4-/5 L, hip abduction 4/5 R, and L (06/03/2022)    Time 8    Period Weeks    Status New    Target Date 07/29/22              Plan - 07/08/22 1416     Clinical Impression Statement Worked on glute, trunk, and ankle strengthening to improve balance and ankle stability as well as to decrease stress to low back in closed chain tasks. Good muscle use felt with exercises. Pt tolerated session well without aggravation of symptoms. Pt will benefit from continued skilled physical therapy services to decrease pain, improve strength and function.    Personal Factors and Comorbidities Comorbidity 3+;Past/Current Experience;Time since onset of injury/illness/exacerbation    Comorbidities MS, HTN, insomnia, muscle spasm    Examination-Activity Limitations Stand;Bend;Lift;Locomotion Level    Stability/Clinical Decision Making Stable/Uncomplicated    Clinical Decision Making Low    Rehab Potential Fair    PT Frequency 2x / week    PT Duration 8 weeks    PT Treatment/Interventions Therapeutic exercise;Neuromuscular re-education;Therapeutic  activities;Functional mobility training;Aquatic Therapy;Electrical Stimulation;Iontophoresis '4mg'$ /ml Dexamethasone;Gait training;Patient/family education;Manual techniques;Dry needling    PT Next Visit Plan Posture, trunk and glute strength, manual techniques, modalities PRN    PT Home Exercise Plan Medbridge Access Code: [email or web address]    Consulted and Agree with Plan of Care Patient                  Joneen Boers PT, DPT  07/08/2022, 3:30 PM

## 2022-07-12 ENCOUNTER — Ambulatory Visit: Payer: Medicare Other

## 2022-07-12 DIAGNOSIS — M5459 Other low back pain: Secondary | ICD-10-CM

## 2022-07-12 DIAGNOSIS — M5417 Radiculopathy, lumbosacral region: Secondary | ICD-10-CM | POA: Diagnosis not present

## 2022-07-12 DIAGNOSIS — R262 Difficulty in walking, not elsewhere classified: Secondary | ICD-10-CM

## 2022-07-12 DIAGNOSIS — M6281 Muscle weakness (generalized): Secondary | ICD-10-CM

## 2022-07-12 NOTE — Therapy (Signed)
OUTPATIENT PHYSICAL THERAPY TREATMENT NOTE   Patient Name: Tammie Lopez MRN: [id number] DOB:[birth year], 51 y.o., female 25 Date: 07/12/2022  PCP: Steele Sizer, MD REFERRING PROVIDER: Concepcion Living, MD   PT End of Session - 07/12/22 1500     Visit Number 8    Number of Visits 17    Date for PT Re-Evaluation 07/29/22    Authorization Type BCBS  [employer] [place] Medicaid    Authorization Time Period 06/03/22-07/29/22    Progress Note Due on Visit 10    PT Start Time 1500    PT Stop Time 1545    PT Time Calculation (min) 45 min    Activity Tolerance Patient tolerated treatment well;No increased pain    Behavior During Therapy WFL for tasks assessed/performed                 Past Medical History:  Diagnosis Date   Allergy    Constipation    Diverticula of colon    Family history of adverse reaction to anesthesia    mother's BP increased   Hypertension    Insomnia    Migraine    migraines   MS (multiple sclerosis) (HCC)    Muscle spasm    Past Surgical History:  Procedure Laterality Date   BREAST BIOPSY Left 01/06/2018    2 areas  neg   BUNIONECTOMY     CESAREAN SECTION N/A 2006   COLONOSCOPY WITH PROPOFOL N/A 02/09/2021   Procedure: COLONOSCOPY WITH PROPOFOL;  Surgeon: [name], MD;  Location: [place];  Service: Endoscopy;  Laterality: N/A;   GANGLION CYST EXCISION Bilateral    Hands   POLYPECTOMY N/A 02/09/2021   Procedure: POLYPECTOMY;  Surgeon: [name], MD;  Location: [place];  Service: Endoscopy;  Laterality: N/A;   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 07/16/2020   Low TSH level 09/19/2019   Obesity (BMI 30-39.9) 12/06/2017   Insulin resistance 09/27/2017   Essential hypertension 09/21/2017   Right sided sciatica  09/21/2017   Major depression, recurrent ([place]) 05/24/2017   Migraine without aura, not intractable 12/09/2016   Nickel allergy 12/09/2016   GERD without esophagitis 12/09/2016   Other neutropenia ([place]) 12/09/2016   Dyslipidemia 12/09/2016   Diverticulosis 12/09/2016   Proctitis 12/09/2016   Allergic rhinitis, seasonal 12/09/2016   Chronic constipation 12/09/2016   Vitamin D deficiency 08/06/2013   MS (multiple sclerosis) ([place]) 04/17/2013    REFERRING DIAG: MS/Balance, gait  THERAPY DIAG:  Other low back pain  Muscle weakness (generalized)  Difficulty in walking, not elsewhere classified  Radiculopathy, lumbosacral region  Rationale for Evaluation and Treatment Rehabilitation  PERTINENT HISTORY: MS/Balance, gait. Diagnosed in 2012 with MS. Has had low back pain since 2 years ago. Has not yet had PT for back pain, went to chiropractor which helped. No change R LE strenght. Pt states if her low back is not hurting, her R LE does not feel as weak. Pt also states feeling L LE sciatic nerve symptoms (L posterior hip). Uses the Be ACtive + L leg which helps with pain. Pt also feels numbeness L toes dorsal side. Feels like her L lateral trunk feels swollen, which is the area her MS is dominant on. Was taking Plegridy for MS which was making her muscles even weaker and  hurt more. Currently not taking medications as a cool down period. Will see Dr. Marianne Sofia in October 2023. Has had L posterior hip/Sciatic symptoms since about 1 .5 years ago, started when pt was taking Plegrity last January 2022. Pt states that she feels like her back pain makes her balance worse.  PRECAUTIONS: No known precautions  SUBJECTIVE: Pt reports RLE pain. Denies falls or stumbles. Some RLE numbness/tingling due stress. Minor soreness in LE's after last session.    PAIN:  Are you having pain? 2/10 R knee     No latex allergies  Blood pressure controlled per pt.    Movement preference at L3/L4    Decreased L posterior hip/sciatic pain with R lateral shift correction    Forward neck, B protracted shoulders, R shoulder higher, R lateral shift    Long sit test: no change in leg length Supine posture: L anterior pelvic nutation Limited R hip IR in 90/90 position   Feels better in R S/L     TODAY'S TREATMENT:  Therapeutic exercise: 07/12/22   Lateral step up onto dyna disc with one UE assist                 R 10x3                L 10x3   Forward step up onto Dyna Disc with contralateral UE assist                R 10x3                L 10x3   Side stepping yellow band around ankles 32' x4 R/L. VC's for upright posture as pt leans to the L when side stepping R. Improved form after cues on second step   Bent over hip extension for glut strengthening: 2x8/LE    Seated LE strengthening with ice pack across low back due to LBP  Heel to toe raises on dynadisc: 2x10   Clam shell with GTB: 2x20        Improved exercise technique, movement at target joints, use of target muscles after mod verbal, visual, tactile cues.        Response to treatment Pt tolerated session well but did have increased LBP post therex   Clinical impression Continuing PT POC with focus on glut, ankle and quad strengthening and dynamic balance. Pt does endorse some aggravation of LBP post session but remained highly motivated to complete exercises. Pt will continue to benefit from skilled PT services to progress strength, gait, and balance with decreased pain levels to improve function.   PATIENT EDUCATION: Education details: therapeutic exercise, HEP Person educated: Patient Education method: Explanation, Demonstration, Tactile cues, Verbal cues, and Handouts Education comprehension: verbalized understanding and returned demonstration   HOME EXERCISE PROGRAM: Access Code: [email or web address] URL: [email or web address] Date: 06/07/2022 Prepared by: Joneen Boers  Exercises - Prone  Quadriceps Set  - 1 x daily - 7 x weekly - 3 sets - 10 reps - 5 seconds hold - Scapular Retraction with Resistance  - 1 x daily - 7 x weekly - 3 sets - 10 reps - 5 seconds hold  Red band.  Sitting with towel roll L thoracic spine, and gentle trunk extension 1 min x 3   - Supine Piriformis Stretch  - 2 x daily - 7 x weekly - 1 sets - 3 reps - 30 seconds to 1 minute hold  - Supine Bridge  - 1 x daily - 7 x  weekly - 2 sets - 10 reps  - Side Stepping with Resistance at Ankles  - 2 x daily - 7 x weekly - 1 sets - 1 reps  Yellow band  - Seated Ankle Eversion AROM  - 1 x daily - 7 x weekly - 3 sets - 10 reps - 5 seconds hold  PT Short Term Goals - 06/03/22 1205       PT SHORT TERM GOAL #1   Title Pt will be independent with her initial HEP to decrease back pain, improve strength, balance, and function.    Baseline Pt has not yet started her HEP (06/03/2022)    Time 3    Period Weeks    Status New    Target Date 06/24/22              PT Long Term Goals - 06/03/22 1206       PT LONG TERM GOAL #1   Title Pt will have a decrease in L PSIS/posterior hip pain to 4/10 or less at worst to promote ability to perform tasks which involve prolonged standing and bending over more comfortably.    Baseline 10/10 at worst for the past 3 months (06/03/2022)    Time 8    Period Weeks    Status New    Target Date 07/29/22      PT LONG TERM GOAL #2   Title Pt will have a decrease in low back (horizontal band) pain to 3/10 or less at worst to promote ability to perform tasks which involve prolonged standing and bending over more comfortably.    Baseline 9/10 at worst for the past 3 months (06/03/2022)    Time 8    Period Weeks    Status New    Target Date 07/29/22      PT LONG TERM GOAL #3   Title Pt will improve her FOTO score by at least 10 points as a demonstration improved function.    Baseline Email of FOTO sent to pt (06/03/2022)    Time 8    Period Weeks    Status New    Target Date  07/29/22      PT LONG TERM GOAL #4   Title Pt will improve her bilateral hip extension and abduction strength by at least 1/2 MMT grade to promote ability to perform standing tasks more comfortably for her back.    Baseline Hip extension 3+/5 R, 4-/5 L, hip abduction 4/5 R, and L (06/03/2022)    Time 8    Period Weeks    Status New    Target Date 07/29/22                     Salem Caster. Fairly IV, PT, DPT Physical Therapist- [facility]  07/12/2022, 4:41 PM

## 2022-07-14 ENCOUNTER — Ambulatory Visit: Payer: Medicare Other

## 2022-07-19 ENCOUNTER — Ambulatory Visit: Payer: Medicare Other

## 2022-07-19 DIAGNOSIS — M5459 Other low back pain: Secondary | ICD-10-CM | POA: Diagnosis not present

## 2022-07-19 DIAGNOSIS — R262 Difficulty in walking, not elsewhere classified: Secondary | ICD-10-CM | POA: Diagnosis not present

## 2022-07-19 DIAGNOSIS — M5417 Radiculopathy, lumbosacral region: Secondary | ICD-10-CM | POA: Diagnosis not present

## 2022-07-19 DIAGNOSIS — M6281 Muscle weakness (generalized): Secondary | ICD-10-CM
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
## 2022-07-21 ENCOUNTER — Ambulatory Visit: Payer: Medicare Other

## 2022-07-21 DIAGNOSIS — R262 Difficulty in walking, not elsewhere classified: Secondary | ICD-10-CM

## 2022-07-21 DIAGNOSIS — M5417 Radiculopathy, lumbosacral region: Secondary | ICD-10-CM

## 2022-07-21 DIAGNOSIS — M6281 Muscle weakness (generalized): Secondary | ICD-10-CM

## 2022-07-21 DIAGNOSIS — M5459 Other low back pain: Secondary | ICD-10-CM | POA: Diagnosis not present

## 2022-07-21 DIAGNOSIS — M25571 Pain in right ankle and joints of right foot: Secondary | ICD-10-CM | POA: Diagnosis not present

## 2022-07-21 DIAGNOSIS — G8929 Other chronic pain: Secondary | ICD-10-CM | POA: Diagnosis not present

## 2022-07-21 DIAGNOSIS — S93491D Sprain of other ligament of right ankle, subsequent encounter: Secondary | ICD-10-CM | POA: Diagnosis not present

## 2022-07-21 DIAGNOSIS — Z87828 Personal history of other (healed) physical injury and trauma: Secondary | ICD-10-CM | POA: Diagnosis not present

## 2022-07-21 NOTE — Therapy (Signed)
OUTPATIENT PHYSICAL THERAPY TREATMENT NOTE And Progress Report (06/03/2022 - 07/21/2022)   Patient Name: Tammie Lopez MRN: [id number] DOB:1971/04/18, 51 y.o., female 52 Date: 07/21/2022  PCP: Steele Sizer, MD REFERRING PROVIDER: Concepcion Living, MD   PT End of Session - 07/21/22 [phone]     Visit Number 10    Number of Visits 17    Date for PT Re-Evaluation 07/29/22    Authorization Type BCBS  Healthy [place] Medicaid    Authorization Time Period 06/03/22-07/29/22    Progress Note Due on Visit 10    PT Start Time 0939    PT Stop Time 1007    PT Time Calculation (min) 28 min    Activity Tolerance Patient tolerated treatment well;No increased pain    Behavior During Therapy WFL for tasks assessed/performed                   Past Medical History:  Diagnosis Date   Allergy    Constipation    Diverticula of colon    Family history of adverse reaction to anesthesia    mother's BP increased   Hypertension    Insomnia    Migraine    migraines   MS (multiple sclerosis) (HCC)    Muscle spasm    Past Surgical History:  Procedure Laterality Date   BREAST BIOPSY Left 01/06/2018    2 areas  neg   BUNIONECTOMY     CESAREAN SECTION N/A 2006   COLONOSCOPY WITH PROPOFOL N/A 02/09/2021   Procedure: COLONOSCOPY WITH PROPOFOL;  Surgeon: [name], MD;  Location: [facility];  Service: Endoscopy;  Laterality: N/A;   GANGLION CYST EXCISION Bilateral    Hands   POLYPECTOMY N/A 02/09/2021   Procedure: POLYPECTOMY;  Surgeon: [name], MD;  Location: [facility];  Service: Endoscopy;  Laterality: N/A;   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 07/16/2020   Low TSH level 09/19/2019   Obesity (BMI 30-39.9) 12/06/2017   Insulin resistance 09/27/2017   Essential  hypertension 09/21/2017   Right sided sciatica 09/21/2017   Major depression, recurrent ([place]) 05/24/2017   Migraine without aura, not intractable 12/09/2016   Nickel allergy 12/09/2016   GERD without esophagitis 12/09/2016   Other neutropenia ([place]) 12/09/2016   Dyslipidemia 12/09/2016   Diverticulosis 12/09/2016   Proctitis 12/09/2016   Allergic rhinitis, seasonal 12/09/2016   Chronic constipation 12/09/2016   Vitamin D deficiency 08/06/2013   MS (multiple sclerosis) ([place]) 04/17/2013    REFERRING DIAG: MS/Balance, gait  THERAPY DIAG:  Other low back pain  Muscle weakness (generalized)  Difficulty in walking, not elsewhere classified  Radiculopathy, lumbosacral region  Rationale for Evaluation and Treatment Rehabilitation  PERTINENT HISTORY: MS/Balance, gait. Diagnosed in 2012 with MS. Has had low back pain since 2 years ago. Has not yet had PT for back pain, went to chiropractor which helped. No change R LE strenght. Pt states if her low back is not hurting, her R LE does not feel as weak. Pt also states feeling L LE sciatic nerve symptoms (L posterior hip). Uses the Be ACtive + L leg which helps with pain. Pt also feels numbeness L toes dorsal side. Feels like her L lateral trunk feels swollen, which is the area her MS is dominant on. Was taking Plegridy for MS  which was making her muscles even weaker and hurt more. Currently not taking medications as a cool down period. Will see Dr. Marianne Sofia in October 2023. Has had L posterior hip/Sciatic symptoms since about 1 .5 years ago, started when pt was taking Plegrity last January 2022. Pt states that she feels like her back pain makes her balance worse.  PRECAUTIONS: No known precautions  SUBJECTIVE: R ankle is bothering her. Needs to leave by 10:05 am. Back is ok, but has been having sciatic pain L butt area focally, 6/10 L posterior hip currently which comes and goes. Balance is about the same. R leg feels week. If she is doing  something for too long, it gets heavy such as if she tries to dance. The onset of heaviness of R LE takes longer to occur. Can be almost to the top of her 12 steps at home before her R LE feels weak. Before R LE feels weak during the 3rd and 4th steps. Can feel some progress. No falls or near falls or tripping recently.      PAIN:  Are you having pain? 5-6/10 currently.      No latex allergies  Blood pressure controlled per pt.    Movement preference at L3/L4   Decreased L posterior hip/sciatic pain with R lateral shift correction    Forward neck, B protracted shoulders, R shoulder higher, R lateral shift    Long sit test: no change in leg length Supine posture: L anterior pelvic nutation Limited R hip IR in 90/90 position   Feels better in R S/L     TODAY'S TREATMENT:  Therapeutic exercise: 07/21/2022    Prone manually resisted hip extension, S/L hip abduction 1x each way for each LE  Reviewed progress/current status with PT towards goals   Long sit test suggests posterior nutation L innominate  Supine SLR L hip flexion 10x5 seconds, then 10x   No L posterior hip pain afterwards   Standing back extension with L side bias 10x5 seconds    Hooklying R lower trunk rotation 10x5 seconds for 2 sets  increased L flank pain   Hooklying L lower trunk rotation 10x5 seconds    Seated manually resisted R lateral shift isometrics in neutral 10x5 seconds   Decreased L flank pain while performing exercise  Sitting with L and R hip weight shifting 2x each direction. Decreased L flank pain.     Improved exercise technique, movement at target joints, use of target muscles after mod verbal, visual, tactile cues.           Response to treatment  Decreased L posterior hip/low back pain and L flank pain after session.    Decreased L flank pain with treatment to decrease L trunk side bend posture.    Clinical impression  Pt demonstrates overall decreased low back  pain, improved bilateral hip strength as well as improved ability to perform tasks prior to onset of R LE weakness. Per pt, she is now able to climb almost a full flight of stairs prior to onset of R LE weakness. Prior to starting PT, pt was only able to climb about 3-4 steps before her R LE felt weak. Pt is making progress with PT towards goals. Unable to obtain updated FOTO scores secondary to emailed initial [place] not being filled out.  Decreased L posterior hip/low back pain and L flank pain after session.  Pt will benefit from continued skilled PT services to progress strength, gait, and balance  with decreased pain levels to improve function.   PATIENT EDUCATION: Education details: therapeutic exercise, HEP Person educated: Patient Education method: Explanation, Demonstration, Tactile cues, Verbal cues, and Handouts Education comprehension: verbalized understanding and returned demonstration   HOME EXERCISE PROGRAM: Access Code: [email or web address] URL: [email or web address] Date: 06/07/2022 Prepared by: Joneen Boers  Exercises - Prone Quadriceps Set  - 1 x daily - 7 x weekly - 3 sets - 10 reps - 5 seconds hold - Scapular Retraction with Resistance  - 1 x daily - 7 x weekly - 3 sets - 10 reps - 5 seconds hold  Red band.  Sitting with towel roll L thoracic spine, and gentle trunk extension 1 min x 3   - Supine Piriformis Stretch  - 2 x daily - 7 x weekly - 1 sets - 3 reps - 30 seconds to 1 minute hold  - Supine Bridge  - 1 x daily - 7 x weekly - 2 sets - 10 reps  - Side Stepping with Resistance at Ankles  - 2 x daily - 7 x weekly - 1 sets - 1 reps  Yellow band  - Seated Ankle Eversion AROM  - 1 x daily - 7 x weekly - 3 sets - 10 reps - 5 seconds hold  PT Short Term Goals - 07/21/22 0941       PT SHORT TERM GOAL #1   Title Pt will be independent with her initial HEP to decrease back pain, improve strength, balance, and function.    Baseline Pt has not yet started her  HEP (06/03/2022); No questions with her HEP, does her HEP (07/21/2022)    Time 3    Period Weeks    Status Achieved    Target Date 06/24/22              PT Long Term Goals - 07/21/22 0942       PT LONG TERM GOAL #1   Title Pt will have a decrease in L PSIS/posterior hip pain to 4/10 or less at worst to promote ability to perform tasks which involve prolonged standing and bending over more comfortably.    Baseline 10/10 at worst for the past 3 months (06/03/2022); 8/10 at worst for the past 7 days, pain is not as frequent. Pain at worst dissapears more quickly now compared to before starting PT (07/21/2022)    Time 8    Period Weeks    Status Partially Met    Target Date 07/29/22      PT LONG TERM GOAL #2   Title Pt will have a decrease in low back (horizontal band) pain to 3/10 or less at worst to promote ability to perform tasks which involve prolonged standing and bending over more comfortably.    Baseline 9/10 at worst for the past 3 months (06/03/2022); 2/10 horizontal low back pain at worst for the past 7 days (07/21/2022)    Time 8    Period Weeks    Status Achieved    Target Date 07/29/22      PT LONG TERM GOAL #3   Title Pt will improve her FOTO score by at least 10 points as a demonstration improved function.    Baseline Email of FOTO sent to pt (06/03/2022)    Time 8    Period Weeks    Status New    Target Date 07/29/22      PT LONG TERM GOAL #4   Title Pt will improve her bilateral hip extension and  abduction strength by at least 1/2 MMT grade to promote ability to perform standing tasks more comfortably for her back.    Baseline Hip extension 3+/5 R, 4-/5 L, hip abduction 4/5 R, and L (06/03/2022);hip extension 4/5 R, 4/5 L, hip abduction 4+/5 R, 5/5 L (07/21/2022)    Time 8    Period Weeks    Status Achieved    Target Date 07/29/22              Plan - 07/21/22 1022     Clinical Impression Statement Pt demonstrates overall decreased low back pain, improved  bilateral hip strength as well as improved ability to perform tasks prior to onset of R LE weakness. Per pt, she is now able to climb almost a full flight of stairs prior to onset of R LE weakness. Prior to starting PT, pt was only able to climb about 3-4 steps before her R LE felt weak. Pt is making progress with PT towards goals. Unable to obtain updated FOTO scores secondary to emailed initial [place] not being filled out.  Decreased L posterior hip/low back pain and L flank pain after session.  Pt will benefit from continued skilled PT services to progress strength, gait, and balance with decreased pain levels to improve function.    Personal Factors and Comorbidities Comorbidity 3+;Past/Current Experience;Time since onset of injury/illness/exacerbation    Comorbidities MS, HTN, insomnia, muscle spasm    Examination-Activity Limitations Stand;Bend;Lift;Locomotion Level    Stability/Clinical Decision Making Stable/Uncomplicated    Clinical Decision Making Low    Rehab Potential Fair    PT Frequency 2x / week    PT Duration 8 weeks    PT Treatment/Interventions Therapeutic exercise;Neuromuscular re-education;Therapeutic activities;Functional mobility training;Aquatic Therapy;Electrical Stimulation;Iontophoresis 26m/ml Dexamethasone;Gait training;Patient/family education;Manual techniques;Dry needling    PT Next Visit Plan Posture, trunk and glute strength, manual techniques, modalities PRN    PT Home Exercise Plan Medbridge Access Code: [email or web address]    Consulted and Agree with Plan of Care Patient               Thank you for your referral.   MJoneen BoersPT, DPT  07/21/2022, 10:24 AM

## 2022-07-28 ENCOUNTER — Ambulatory Visit (INDEPENDENT_AMBULATORY_CARE_PROVIDER_SITE_OTHER): Payer: Medicare Other

## 2022-07-28 DIAGNOSIS — Z23 Encounter for immunization: Secondary | ICD-10-CM | POA: Diagnosis not present

## 2022-07-28 DIAGNOSIS — R7989 Other specified abnormal findings of blood chemistry: Secondary | ICD-10-CM | POA: Diagnosis not present

## 2022-07-28 DIAGNOSIS — B353 Tinea pedis: Secondary | ICD-10-CM | POA: Diagnosis not present

## 2022-07-28 DIAGNOSIS — B351 Tinea unguium: Secondary | ICD-10-CM | POA: Diagnosis not present

## 2022-07-29 ENCOUNTER — Ambulatory Visit: Payer: Medicare Other

## 2022-07-29 LAB — HEPATIC FUNCTION PANEL
AG Ratio: 1.8 (calc) (ref 1.0–2.5)
ALT: 10 U/L (ref 6–29)
AST: 11 U/L (ref 10–35)
Albumin: 4.5 g/dL (ref 3.6–5.1)
Alkaline phosphatase (APISO): 38 U/L (ref 37–153)
Bilirubin, Direct: 0.1 mg/dL (ref 0.0–0.2)
Globulin: 2.5 g/dL (calc) (ref 1.9–3.7)
Indirect Bilirubin: 0.3 mg/dL (calc) (ref 0.2–1.2)
Total Bilirubin: 0.4 mg/dL (ref 0.2–1.2)
Total Protein: 7 g/dL (ref 6.1–8.1)

## 2022-07-29 LAB — TSH: TSH: 0.65 mIU/L

## 2022-08-03 ENCOUNTER — Other Ambulatory Visit (HOSPITAL_COMMUNITY): Payer: Self-pay | Admitting: Student

## 2022-08-03 ENCOUNTER — Other Ambulatory Visit: Payer: Self-pay | Admitting: Student

## 2022-08-03 DIAGNOSIS — G8929 Other chronic pain: Secondary | ICD-10-CM

## 2022-08-03 DIAGNOSIS — Z87828 Personal history of other (healed) physical injury and trauma: Secondary | ICD-10-CM

## 2022-08-03 DIAGNOSIS — S93491D Sprain of other ligament of right ankle, subsequent encounter: Secondary | ICD-10-CM

## 2022-08-04 ENCOUNTER — Ambulatory Visit: Payer: Medicare Other

## 2022-08-09 ENCOUNTER — Ambulatory Visit
Admission: RE | Admit: 2022-08-09 | Discharge: 2022-08-09 | Disposition: A | Discharge status: Home / Self Care | Payer: Medicare Other | Source: Ambulatory Visit | Attending: Student | Admitting: Student

## 2022-08-09 DIAGNOSIS — G8929 Other chronic pain: Secondary | ICD-10-CM | POA: Diagnosis not present

## 2022-08-09 DIAGNOSIS — M25571 Pain in right ankle and joints of right foot: Secondary | ICD-10-CM | POA: Insufficient documentation

## 2022-08-09 DIAGNOSIS — Z87828 Personal history of other (healed) physical injury and trauma: Secondary | ICD-10-CM | POA: Diagnosis not present

## 2022-08-09 DIAGNOSIS — S93491D Sprain of other ligament of right ankle, subsequent encounter: Secondary | ICD-10-CM | POA: Diagnosis not present

## 2022-08-09 DIAGNOSIS — R600 Localized edema: Secondary | ICD-10-CM | POA: Diagnosis not present

## 2022-08-17 ENCOUNTER — Encounter: Payer: Self-pay | Admitting: Gastroenterology

## 2022-08-18 ENCOUNTER — Encounter: Payer: Self-pay | Admitting: Gastroenterology

## 2022-08-18 MED ORDER — HYDROCORTISONE (PERIANAL) 2.5 % EX CREA: 1.0000 | TOPICAL_CREAM | Freq: Two times a day (BID) | CUTANEOUS | 1 refills | Status: DC

## 2022-08-18 MED ORDER — HYDROCORTISONE ACETATE 25 MG RE SUPP: 25.0000 mg | Freq: Two times a day (BID) | RECTAL | 0 refills | Status: DC

## 2022-08-18 NOTE — Addendum Note (Signed)
Addended by: Lurlean Nanny on: 08/18/2022 10:50 AM   Modules accepted: Orders

## 2022-08-21 ENCOUNTER — Other Ambulatory Visit: Payer: Self-pay | Admitting: Family Medicine

## 2022-08-21 DIAGNOSIS — I10 Essential (primary) hypertension: Secondary | ICD-10-CM

## 2022-08-23 NOTE — Unmapped (Unsigned)
Specialty Medication(s): Plegridy    Ms.Theresa Mulligan has been dis-enrolled from the [facility] specialty pharmacy services due to medication discontinuation resulting from side effect intolerance.    Additional information provided to the patient: n/a    Arnold Long, PharmD  [facility] Specialty Pharmacist

## 2022-08-25 ENCOUNTER — Ambulatory Visit (INDEPENDENT_AMBULATORY_CARE_PROVIDER_SITE_OTHER): Payer: Medicare Other | Admitting: Family Medicine

## 2022-08-25 ENCOUNTER — Encounter: Payer: Self-pay | Admitting: Family Medicine

## 2022-08-25 VITALS — BP 116/72 | HR 93 | Temp 97.9°F | Resp 14 | Ht 62.0 in | Wt 162.9 lb

## 2022-08-25 DIAGNOSIS — E559 Vitamin D deficiency, unspecified: Secondary | ICD-10-CM

## 2022-08-25 DIAGNOSIS — G43009 Migraine without aura, not intractable, without status migrainosus: Secondary | ICD-10-CM | POA: Diagnosis not present

## 2022-08-25 DIAGNOSIS — K219 Gastro-esophageal reflux disease without esophagitis: Secondary | ICD-10-CM | POA: Diagnosis not present

## 2022-08-25 DIAGNOSIS — G35D Multiple sclerosis, unspecified: Secondary | ICD-10-CM

## 2022-08-25 DIAGNOSIS — E785 Hyperlipidemia, unspecified: Secondary | ICD-10-CM | POA: Diagnosis not present

## 2022-08-25 DIAGNOSIS — R7303 Prediabetes: Secondary | ICD-10-CM | POA: Diagnosis not present

## 2022-08-25 DIAGNOSIS — G35 Multiple sclerosis: Secondary | ICD-10-CM | POA: Diagnosis not present

## 2022-08-25 DIAGNOSIS — I10 Essential (primary) hypertension: Secondary | ICD-10-CM

## 2022-08-25 DIAGNOSIS — D708 Other neutropenia: Secondary | ICD-10-CM

## 2022-08-25 NOTE — Progress Notes (Signed)
Name: Tammie Lopez   MRN: [id number]    DOB: 1971-07-11   Date:08/25/2022       Progress Note  Subjective  Chief Complaint  Follow Up  HPI  HTN: BP is at goal. She has been compliant with medications now. She denies chest pain, dizziness or palpitation  BP is at goal    MS: she has been off Tacfidera due to leucopenia, she was going to [facility] neurology, and was taking  Plegridy. She is now seeing Dr. Annamaria Helling  at [facility] and has been off all medications, they will discuss therapy during upcoming visit in October. Right leg weakness improved with PT   Low back pain: going on for past couple of years, improves with meloxicam pain is aching like and constant, can radiate to pelvic area but not her legs. She has not  been to Dr. Joyce Copa , but had PT and is doing better   Obesity: she has insulin resistance, took Trulicity,  and Metformin in the past , started [facility]  October 2022 and went up to 1 mg March 2023 weight was trending down but stopped taking it on her own about one month ago and weight is up again.   Pre-diabetes: last A1C improved,  last level was 5.6 % .  She has  HTN and increase in abdominal girth, she stopped Ozempic due to constipation, but she states she will try to resume it    Depression: she is frustrated about inability to lose weight, we will go up on dose of Ozempic today. She is also very concerned about abdominal fold, getting married next year and states not feeling good about her body image and does not want to have sex. She states she has a friend that had abdominal panniculectomy and she would like to go see Psychiatric nurse in [place], Dr. Claudia Desanctis, she has not seen him yet. She states feeling better lately   LUQ pain: seen by Dr. Allen Norris last September she was given Linzess but states cannot take it daily because it causes diarrhea. We changed in to Trulance but she is not sure if it worked since she has been taking Dulcolax, and active medication still says Linzess  but she states she has some trulance at home and will try it out  . She continues to have bloating and distention of left upper quadrant that is worse after meals, causes discomfort. It improves with a bowel movement. She has a follow up coming up with Dr. Allen Norris soon   Right ankle sprain : injury in May, seen by Ortho and was given a boot, still has pain and swelling.   Migraine headaches: takes Ubrelvy prn , episodes of headaches are down to 2-3 times per month, she has associated photophobia but denies associated nausea or vomiting   URI: she has noticed some rhinorrhea, sneezing, no chills or fever. She did a home covid test that was negative. Symptoms started yesterday and is feeling better today   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 07/16/2020   Low TSH level 09/19/2019   Obesity (BMI 30-39.9) 12/06/2017   Insulin resistance 09/27/2017   Essential hypertension 09/21/2017   Right sided sciatica 09/21/2017   Major depression, recurrent ([place]) 05/24/2017   Migraine without aura, not intractable 12/09/2016   Nickel allergy 12/09/2016  GERD without esophagitis 12/09/2016   Other neutropenia ([place]) 12/09/2016   Dyslipidemia 12/09/2016   Diverticulosis 12/09/2016   Proctitis 12/09/2016   Allergic rhinitis, seasonal 12/09/2016   Chronic constipation 12/09/2016   Vitamin D deficiency 08/06/2013   MS (multiple sclerosis) ([place]) 04/17/2013    Past Surgical History:  Procedure Laterality Date   BREAST BIOPSY Left 01/06/2018    2 areas  neg   BUNIONECTOMY     CESAREAN SECTION N/A 2006   COLONOSCOPY WITH PROPOFOL N/A 02/09/2021   Procedure: COLONOSCOPY WITH PROPOFOL;  Surgeon: [name], MD;  Location: [place];  Service: Endoscopy;  Laterality: N/A;   GANGLION CYST EXCISION Bilateral    Hands    POLYPECTOMY N/A 02/09/2021   Procedure: POLYPECTOMY;  Surgeon: [name], MD;  Location: [facility];  Service: Endoscopy;  Laterality: N/A;    Family History  Problem Relation Age of Onset   Diabetes Mother    Hypertension Mother    Cancer Father 82       Mouth   COPD Father    Hypertension Brother    Diabetes Brother    Breast cancer Neg Hx     Social History   Tobacco Use   Smoking status: Never   Smokeless tobacco: Never   Tobacco comments:    smoking cessation materials not required  Substance Use Topics   Alcohol use: No     Current Outpatient Medications:    amLODipine (NORVASC) 2.5 MG tablet, TAKE 1 TABLET BY MOUTH EVERY DAY, Disp: 90 tablet, Rfl: 1   Cholecalciferol (VITAMIN D) 50 MCG (2000 UT) CAPS, Take 2,000 Units by mouth daily., Disp: , Rfl:    hydrocortisone (ANUSOL-HC) 2.5 % rectal cream, Place 1 Application rectally 2 (two) times daily., Disp: 30 g, Rfl: 1   hyoscyamine (LEVSIN) 0.125 MG tablet, Take 1 tablet (0.125 mg total) by mouth every 4 (four) hours as needed., Disp: 30 tablet, Rfl: 0   linaclotide (LINZESS) 290 MCG CAPS capsule, Take 290 mcg by mouth daily before breakfast., Disp: , Rfl:    omeprazole (PRILOSEC) 40 MG capsule, TAKE 1 CAPSULE BY MOUTH EVERY DAY, Disp: 90 capsule, Rfl: 1   pregabalin (LYRICA) 50 MG capsule, Take 1-3 capsules (50-150 mg total) by mouth 3 (three) times daily., Disp: 90 capsule, Rfl: 0   Semaglutide, 1 MG/DOSE, 4 MG/3ML SOPN, Inject 1 mg as directed once a week., Disp: 9 mL, Rfl: 0   terbinafine (LAMISIL) 250 MG tablet, Take 1 tablet (250 mg total) by mouth daily., Disp: 90 tablet, Rfl: 0   traZODone (DESYREL) 50 MG tablet, TAKE 1/2 TO 1 TABLET BY MOUTH AT BEDTIME AS NEEDED FOR SLEEP, Disp: 90 tablet, Rfl: 0   Ubrogepant (UBRELVY) 100 MG TABS, Take 1 tablet by mouth daily as needed. For migraine headaches, Disp: 16 tablet, Rfl: 1   progesterone (PROMETRIUM) 100 MG capsule, Take 100 mg by mouth at bedtime.  (Patient not taking: Reported on 08/25/2022), Disp: , Rfl:   Allergies  Allergen Reactions   Penicillins Hives    I personally reviewed active problem list, medication list, allergies, family history, social history, health maintenance with the patient/caregiver today.   ROS  Constitutional: Negative for fever, positive for  weight change.  Respiratory: Negative for cough and shortness of breath.   Cardiovascular: Negative for chest pain or palpitations.  Gastrointestinal: Negative for abdominal pain, no bowel changes.  Musculoskeletal: positive  for gait problem and right  joint swelling.  Skin: Negative for rash.  Neurological: Negative for dizziness or headache.  No other specific complaints in a complete review of systems (except as listed in HPI above).   Objective  Vitals:   08/25/22 1054  BP: 116/72  Pulse: 93  Resp: 14  Temp: 97.9 F (36.6 C)  TempSrc: Oral  SpO2: 97%  Weight: 162 lb 14.4 oz (73.9 kg)  Height: '5\' 2"'$  (1.575 m)    Body mass index is 29.79 kg/m.  Physical Exam  Constitutional: Patient appears well-developed and well-nourished. Obese  No distress.  HEENT: head atraumatic, normocephalic, pupils equal and reactive to light, neck supple Cardiovascular: Normal rate, regular rhythm and normal heart sounds.  No murmur heard. No BLE edema. Pulmonary/Chest: Effort normal and breath sounds normal. No respiratory distress. Abdominal: Soft.  There is no tenderness. Psychiatric: Patient has a normal mood and affect. behavior is normal. Judgment and thought content normal.  Muscular Skeletal: wearing a brace on right ankle   Recent Results (from the past 2160 hour(s))  TSH     Status: None   Collection Time: 07/28/22  8:46 AM  Result Value Ref Range   TSH 0.65 mIU/L    Comment:           Reference Range .           > or = 20 Years  0.40-4.50 .                Pregnancy Ranges           First trimester    0.26-2.66           Second trimester    0.55-2.73           Third trimester    0.43-2.91   Hepatic function panel     Status: None   Collection Time: 07/28/22  8:46 AM  Result Value Ref Range   Total Protein 7.0 6.1 - 8.1 g/dL   Albumin 4.5 3.6 - 5.1 g/dL   Globulin 2.5 1.9 - 3.7 g/dL (calc)   AG Ratio 1.8 1.0 - 2.5 (calc)   Total Bilirubin 0.4 0.2 - 1.2 mg/dL   Bilirubin, Direct 0.1 0.0 - 0.2 mg/dL   Indirect Bilirubin 0.3 0.2 - 1.2 mg/dL (calc)   Alkaline phosphatase (APISO) 38 37 - 153 U/L   AST 11 10 - 35 U/L   ALT 10 6 - 29 U/L    PHQ2/9:    08/25/2022   10:59 AM 05/28/2022   11:09 AM 02/15/2022    9:02 AM 02/09/2022    1:47 PM 01/27/2022   11:00 AM  Depression screen PHQ 2/9  Decreased Interest 0 0 0 0 1  Down, Depressed, Hopeless 0 0 0 0 1  PHQ - 2 Score 0 0 0 0 2  Altered sleeping 0 '3 3 1 1  '$ Tired, decreased energy 0 0 '3 1 1  '$ Change in appetite 0 0 0 1 1  Feeling bad or failure about yourself  0 0 0 0 1  Trouble concentrating 0 0 0 0 1  Moving slowly or fidgety/restless 0 0 0 0 0  Suicidal thoughts 0 0 0 0 0  PHQ-9 Score 0 '3 6 3 7  '$ Difficult doing work/chores    Not difficult at all Somewhat difficult    phq 9 is negative   Fall Risk:    08/25/2022   10:59 AM 05/28/2022   11:09 AM 02/15/2022    8:58 AM 02/09/2022    1:50 PM 01/27/2022  11:00 AM  Fall Risk   Falls in the past year? 1 0 0 0 0  Number falls in past yr: 0 0 0 0 0  Injury with Fall? 1 0 0 0 0  Risk for fall due to : History of fall(s) No Fall Risks No Fall Risks No Fall Risks   Follow up Falls prevention discussed;Education provided;Falls evaluation completed Falls prevention discussed Falls prevention discussed Falls prevention discussed       Functional Status Survey: Is the patient deaf or have difficulty hearing?: No Does the patient have difficulty seeing, even when wearing glasses/contacts?: No Does the patient have difficulty concentrating, remembering, or making decisions?: No Does the patient have difficulty walking or  climbing stairs?: Yes Does the patient have difficulty dressing or bathing?: No Does the patient have difficulty doing errands alone such as visiting a doctor's office or shopping?: No    Assessment & Plan  1. MS (multiple sclerosis) ([place])  Keep follow up with neurologist at [facility]  2. Other neutropenia ([place])  We will recheck labs  3. Pre-diabetes  - Hemoglobin A1c  4. Vitamin D deficiency disease   5. Dyslipidemia  - Lipid panel  6. Hypertension, benign  - CBC with Differential/Platelet - BASIC METABOLIC PANEL WITH GFR  7. GERD without esophagitis   8. Migraine without aura and without status migrainosus, not intractable   stable

## 2022-08-30 ENCOUNTER — Ambulatory Visit: Payer: Medicare Other | Admitting: Family Medicine

## 2022-08-30 DIAGNOSIS — G8929 Other chronic pain: Secondary | ICD-10-CM | POA: Diagnosis not present

## 2022-08-30 DIAGNOSIS — Z87828 Personal history of other (healed) physical injury and trauma: Secondary | ICD-10-CM | POA: Diagnosis not present

## 2022-08-30 DIAGNOSIS — M25571 Pain in right ankle and joints of right foot: Secondary | ICD-10-CM | POA: Diagnosis not present

## 2022-08-30 DIAGNOSIS — S93491D Sprain of other ligament of right ankle, subsequent encounter: Secondary | ICD-10-CM | POA: Diagnosis not present

## 2022-09-01 NOTE — Progress Notes (Unsigned)
Name: LEMMIE STEINHAUS   MRN: [id number]    DOB: 1971-06-17   Date:09/02/2022       Progress Note  Subjective  Chief Complaint  Annual Exam  HPI  Patient presents for annual CPE.  Diet: she was not eating healthy, but back on Ozempic since last week, she is trying to resume a healthier diet. Discussed importance of lean meat, tree nuts, fruit and vegetables.  Exercise: discussed 150 minutes   Last Eye Exam:yearly  Last Dental Exam: every 6 months   Flowsheet Row Clinical Support from 02/09/2022 in [facility]  AUDIT-C Score 0      Depression: Phq 9 is  negative    09/02/2022   10:02 AM 08/25/2022   10:59 AM 05/28/2022   11:09 AM 02/15/2022    9:02 AM 02/09/2022    1:47 PM  Depression screen PHQ 2/9  Decreased Interest 0 0 0 0 0  Down, Depressed, Hopeless 0 0 0 0 0  PHQ - 2 Score 0 0 0 0 0  Altered sleeping 3 0 '3 3 1  ' Tired, decreased energy 0 0 0 3 1  Change in appetite 0 0 0 0 1  Feeling bad or failure about yourself  0 0 0 0 0  Trouble concentrating 0 0 0 0 0  Moving slowly or fidgety/restless 0 0 0 0 0  Suicidal thoughts 0 0 0 0 0  PHQ-9 Score 3 0 '3 6 3  ' Difficult doing work/chores     Not difficult at all   Hypertension: BP Readings from Last 3 Encounters:  09/02/22 124/86  08/25/22 116/72  05/28/22 122/78   Obesity: Wt Readings from Last 3 Encounters:  09/02/22 162 lb (73.5 kg)  08/25/22 162 lb 14.4 oz (73.9 kg)  05/28/22 155 lb (70.3 kg)   BMI Readings from Last 3 Encounters:  09/02/22 29.63 kg/m  08/25/22 29.79 kg/m  05/28/22 28.35 kg/m     Vaccines:   HPV: N/A Tdap: up to date  Shingrix: up to date Pneumonia: today  Flu: 2015,refuses  COVID-19: discussed booster    Hep C Screening: 11/24/18 STD testing and prevention (HIV/chl/gon/syphilis): 05/24/17 Intimate partner violence: negative screen  Sexual History : one partner , she has vaginal dryness but stable.  Menstrual History/LMP/Abnormal Bleeding: LMP was a couple of  months ago, she has hot flashes , night sweats mood swings. She was using some compound supplementation but is out and symptoms are worse . Discussed clonidine and Effexor and she chose the later  Discussed importance of follow up if any post-menopausal bleeding: not applicable  Incontinence Symptoms: negative for symptoms   Breast cancer:  - Last Mammogram: 05/26/22 - BRCA gene screening: N/A  Osteoporosis Prevention : Discussed high calcium and vitamin D supplementation, weight bearing exercises Bone density:  N/A  Cervical cancer screening: 06/11/21  Skin cancer: Discussed monitoring for atypical lesions  Colorectal cancer: 02/09/21   Lung cancer:  Low Dose CT Chest recommended if Age 34-80 years, 20 pack-year currently smoking OR have quit w/in 15years. Patient does not qualify for screen   ECG: 08/26/20  Advanced Care Planning: A voluntary discussion about advance care planning including the explanation and discussion of advance directives.  Discussed health care proxy and Living will, and the patient was able to identify a health care proxy as oldest daughter .  Patient does not have a living will and power of attorney of health care   Lipids: Lab Results  Component Value Date  CHOL 161 06/11/2021   CHOL 175 10/15/2020   CHOL 175 04/21/2020   Lab Results  Component Value Date   HDL 58 06/11/2021   HDL 54 10/15/2020   HDL 57 04/21/2020   Lab Results  Component Value Date   LDLCALC 80 06/11/2021   LDLCALC 107 (H) 10/15/2020   LDLCALC 99 04/21/2020   Lab Results  Component Value Date   TRIG 125 06/11/2021   TRIG 56 10/15/2020   TRIG 96 04/21/2020   Lab Results  Component Value Date   CHOLHDL 2.8 06/11/2021   CHOLHDL 3.2 10/15/2020   CHOLHDL 3.1 04/21/2020   No results found for: "LDLDIRECT"  Glucose: Glucose, Bld  Date Value Ref Range Status  06/11/2021 96 65 - 99 mg/dL Final    Comment:    .            Fasting reference interval .   10/15/2020 88 65 - 99  mg/dL Final    Comment:    .            Fasting reference interval .   04/21/2020 88 65 - 99 mg/dL Final    Comment:    .            Fasting reference interval .     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 07/16/2020   Low TSH level 09/19/2019   Obesity (BMI 30-39.9) 12/06/2017   Insulin resistance 09/27/2017   Essential hypertension 09/21/2017   Right sided sciatica 09/21/2017   Major depression, recurrent ([place]) 05/24/2017   Migraine without aura, not intractable 12/09/2016   Nickel allergy 12/09/2016   GERD without esophagitis 12/09/2016   Other neutropenia ([place]) 12/09/2016   Dyslipidemia 12/09/2016   Diverticulosis 12/09/2016   Proctitis 12/09/2016   Allergic rhinitis, seasonal 12/09/2016   Chronic constipation 12/09/2016   Vitamin D deficiency 08/06/2013   MS (multiple sclerosis) ([place]) 04/17/2013    Past Surgical History:  Procedure Laterality Date   BREAST BIOPSY Left 01/06/2018    2 areas  neg   BUNIONECTOMY     CESAREAN SECTION N/A 2006   COLONOSCOPY WITH PROPOFOL N/A 02/09/2021   Procedure: COLONOSCOPY WITH PROPOFOL;  Surgeon: [name], MD;  Location: [place];  Service: Endoscopy;  Laterality: N/A;   GANGLION CYST EXCISION Bilateral    Hands   POLYPECTOMY N/A 02/09/2021   Procedure: POLYPECTOMY;  Surgeon: [name], MD;  Location: [place];  Service: Endoscopy;  Laterality: N/A;    Family History  Problem Relation Age of Onset   Diabetes Mother    Hypertension Mother    Cancer Father 55       Mouth   COPD Father    Hypertension Brother    Diabetes Brother    Breast cancer Neg Hx     Social History   Socioeconomic History   Marital status: Single    Spouse name: Not on file   Number of children: 3   Years of education: Not on file    Highest education level: High school graduate  Occupational History    Comment: bus driver  Tobacco Use   Smoking status: Never   Smokeless tobacco: Never   Tobacco comments:    smoking cessation materials not required  Vaping Use   Vaping Use: Never used  Substance and Sexual Activity   Alcohol use: No   Drug use: No   Sexual activity: Not Currently    Partners: Male  Other Topics Concern   Not on file  Social History Narrative   She is on disability for MS, able to work 20 hours per week, 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   Social Determinants of Health   Financial Resource Strain: Low Risk  (09/02/2022)   Overall Financial Resource Strain (CARDIA)    Difficulty of Paying Living Expenses: Not hard at all  Food Insecurity: No Food Insecurity (09/02/2022)   Hunger Vital Sign    Worried About Running Out of Food in the Last Year: Never true    [place] in the Last Year: Never true  Transportation Needs: No Transportation Needs (09/02/2022)   PRAPARE - Hydrologist (Medical): No    Lack of Transportation (Non-Medical): No  Physical Activity: Inactive (09/02/2022)   Exercise Vital Sign    Days of Exercise per Week: 0 days    Minutes of Exercise per Session: 0 min  Stress: No Stress Concern Present (09/02/2022)   [place]    Feeling of Stress : Only a little  Social Connections: Moderately Isolated (09/02/2022)   Social Connection and Isolation Panel [NHANES]    Frequency of Communication with Friends and Family: More than three times a week    Frequency of Social Gatherings with Friends and Family: Twice a week    Attends Religious Services: More than 4 times per year    Active Member of [employer] or Organizations: No    Attends Archivist Meetings: Never    Marital Status: Never married  Intimate Partner Violence: Not At Risk (09/02/2022)   Humiliation, Afraid,  Rape, and Kick questionnaire    Fear of Current or Ex-Partner: No    Emotionally Abused: No    Physically Abused: No    Sexually Abused: No     Current Outpatient Medications:    amLODipine (NORVASC) 2.5 MG tablet, TAKE 1 TABLET BY MOUTH EVERY DAY, Disp: 90 tablet, Rfl: 1   Cholecalciferol (VITAMIN D) 50 MCG (2000 UT) CAPS, Take 2,000 Units by mouth daily., Disp: , Rfl:    hydrocortisone (ANUSOL-HC) 2.5 % rectal cream, Place 1 Application rectally 2 (two) times daily., Disp: 30 g, Rfl: 1   hyoscyamine (LEVSIN) 0.125 MG tablet, Take 1 tablet (0.125 mg total) by mouth every 4 (four) hours as needed., Disp: 30 tablet, Rfl: 0   linaclotide (LINZESS) 290 MCG CAPS capsule, Take 290 mcg by mouth daily before breakfast., Disp: , Rfl:    omeprazole (PRILOSEC) 40 MG capsule, TAKE 1 CAPSULE BY MOUTH EVERY DAY, Disp: 90 capsule, Rfl: 1   pregabalin (LYRICA) 50 MG capsule, Take 1-3 capsules (50-150 mg total) by mouth 3 (three) times daily., Disp: 90 capsule, Rfl: 0   Semaglutide, 1 MG/DOSE, 4 MG/3ML SOPN, Inject 1 mg as directed once a week., Disp: 9 mL, Rfl: 0   traZODone (DESYREL) 50 MG tablet, TAKE 1/2 TO 1 TABLET BY MOUTH AT BEDTIME AS NEEDED FOR SLEEP, Disp: 90 tablet, Rfl: 0   Ubrogepant (UBRELVY) 100 MG TABS, Take 1 tablet by mouth daily as needed. For migraine headaches, Disp: 16 tablet, Rfl: 1   venlafaxine XR (EFFEXOR XR) 37.5 MG 24 hr capsule, Take 1 capsule (37.5 mg total) by mouth daily with breakfast., Disp: 30 capsule, Rfl: 0  Allergies  Allergen Reactions   Penicillins Hives     ROS  Constitutional: Negative for fever or weight change.  Respiratory: Negative for cough and shortness of breath.   Cardiovascular: Negative for chest pain or palpitations.  Gastrointestinal: Negative for abdominal pain, no bowel changes.  Musculoskeletal: Negative for gait problem or joint swelling.  Skin: Negative for rash.  Neurological: Negative for dizziness or headache.  No other specific  complaints in a complete review of systems (except as listed in HPI above).   Objective   Vitals:   09/02/22 1057  BP: 124/86  Pulse: 89  Resp: 16  SpO2: 98%  Weight: 162 lb (73.5 kg)  Height: '5\' 2"'  (1.575 m)    Body mass index is 29.63 kg/m.  Physical Exam  Constitutional: Patient appears well-developed and well-nourished. No distress.  HENT: Head: Normocephalic and atraumatic. Ears: B TMs ok, no erythema or effusion; Nose: Nose normal. Mouth/Throat: Oropharynx is clear and moist. No oropharyngeal exudate.  Eyes: Conjunctivae and EOM are normal. Pupils are equal, round, and reactive to light. No scleral icterus.  Neck: Normal range of motion. Neck supple. No JVD present. No thyromegaly present.  Cardiovascular: Normal rate, regular rhythm and normal heart sounds.  No murmur heard. No BLE edema. Pulmonary/Chest: Effort normal and breath sounds normal. No respiratory distress. Abdominal: Soft. Bowel sounds are normal, no distension. There is no tenderness. no masses Breast: no lumps or masses, no nipple discharge or rashes FEMALE GENITALIA:  Not done  RECTAL: not done  Musculoskeletal: wearing a boot on right lower leg  Neurological: he is alert and oriented to person, place, and time. No cranial nerve deficit. Coordination, balance, strength, speech and gait are normal.  Skin: Skin is warm and dry. No rash noted. No erythema.  Psychiatric: Patient has a normal mood and affect. behavior is normal. Judgment and thought content normal.   Recent Results (from the past 2160 hour(s))  TSH     Status: None   Collection Time: 07/28/22  8:46 AM  Result Value Ref Range   TSH 0.65 mIU/L    Comment:           Reference Range .           > or = 20 Years  0.40-4.50 .                Pregnancy Ranges           First trimester    0.26-2.66           Second trimester   0.55-2.73           Third trimester    0.43-2.91   Hepatic function panel     Status: None   Collection Time:  07/28/22  8:46 AM  Result Value Ref Range   Total Protein 7.0 6.1 - 8.1 g/dL   Albumin 4.5 3.6 - 5.1 g/dL   Globulin 2.5 1.9 - 3.7 g/dL (calc)   AG Ratio 1.8 1.0 - 2.5 (calc)   Total Bilirubin 0.4 0.2 - 1.2 mg/dL   Bilirubin, Direct 0.1 0.0 - 0.2 mg/dL   Indirect Bilirubin 0.3 0.2 - 1.2 mg/dL (calc)   Alkaline phosphatase (APISO) 38 37 - 153 U/L   AST 11 10 - 35 U/L   ALT 10 6 - 29 U/L     Fall Risk:    09/02/2022   10:02 AM 08/25/2022   10:59 AM 05/28/2022   11:09 AM 02/15/2022    8:58 AM  02/09/2022    1:50 PM  Fall Risk   Falls in the past year? 1 1 0 0 0  Number falls in past yr: 0 0 0 0 0  Injury with Fall? 1 1 0 0 0  Risk for fall due to : No Fall Risks History of fall(s) No Fall Risks No Fall Risks No Fall Risks  Follow up Falls prevention discussed Falls prevention discussed;Education provided;Falls evaluation completed Falls prevention discussed Falls prevention discussed Falls prevention discussed     Functional Status Survey: Is the patient deaf or have difficulty hearing?: No Does the patient have difficulty seeing, even when wearing glasses/contacts?: No Does the patient have difficulty concentrating, remembering, or making decisions?: No Does the patient have difficulty walking or climbing stairs?: Yes Does the patient have difficulty dressing or bathing?: No Does the patient have difficulty doing errands alone such as visiting a doctor's office or shopping?: No   Assessment & Plan  1. Well adult exam   2. Dysplasia of cervix, low grade (CIN 1)  - Ambulatory referral to Obstetrics / Gynecology  3. Need for pneumococcal 20-valent conjugate vaccination  - Pneumococcal conjugate vaccine 20-valent (Prevnar 20)  4. Perimenopausal vasomotor symptoms  - venlafaxine XR (EFFEXOR XR) 37.5 MG 24 hr capsule; Take 1 capsule (37.5 mg total) by mouth daily with breakfast.  Dispense: 30 capsule; Refill: 0   -USPSTF grade A and B recommendations reviewed with patient;  age-appropriate recommendations, preventive care, screening tests, etc discussed and encouraged; healthy living encouraged; see AVS for patient education given to patient -Discussed importance of 150 minutes of physical activity weekly, eat two servings of fish weekly, eat one serving of tree nuts ( cashews, pistachios, pecans, almonds.[place] Kitchen) every other day, eat 6 servings of fruit/vegetables daily and drink plenty of water and avoid sweet beverages.   -Reviewed Health Maintenance: Yes.

## 2022-09-01 NOTE — Patient Instructions (Signed)
Preventive Care 40-50 Years Old, Female Preventive care refers to lifestyle choices and visits with your health care provider that can promote health and wellness. Preventive care visits are also called wellness exams. What can I expect for my preventive care visit? Counseling Your health care provider may ask you questions about your: Medical history, including: Past medical problems. Family medical history. Pregnancy history. Current health, including: Menstrual cycle. Method of birth control. Emotional well-being. Home life and relationship well-being. Sexual activity and sexual health. Lifestyle, including: Alcohol, nicotine or tobacco, and drug use. Access to firearms. Diet, exercise, and sleep habits. Work and work environment. Sunscreen use. Safety issues such as seatbelt and bike helmet use. Physical exam Your health care provider will check your: Height and weight. These may be used to calculate your BMI (body mass index). BMI is a measurement that tells if you are at a healthy weight. Waist circumference. This measures the distance around your waistline. This measurement also tells if you are at a healthy weight and may help predict your risk of certain diseases, such as type 2 diabetes and high blood pressure. Heart rate and blood pressure. Body temperature. Skin for abnormal spots. What immunizations do I need?  Vaccines are usually given at various ages, according to a schedule. Your health care provider will recommend vaccines for you based on your age, medical history, and lifestyle or other factors, such as travel or where you work. What tests do I need? Screening Your health care provider may recommend screening tests for certain conditions. This may include: Lipid and cholesterol levels. Diabetes screening. This is done by checking your blood sugar (glucose) after you have not eaten for a while (fasting). Pelvic exam and Pap test. Hepatitis B test. Hepatitis C  test. HIV (human immunodeficiency virus) test. STI (sexually transmitted infection) testing, if you are at risk. Lung cancer screening. Colorectal cancer screening. Mammogram. Talk with your health care provider about when you should start having regular mammograms. This may depend on whether you have a family history of breast cancer. BRCA-related cancer screening. This may be done if you have a family history of breast, ovarian, tubal, or peritoneal cancers. Bone density scan. This is done to screen for osteoporosis. Talk with your health care provider about your test results, treatment options, and if necessary, the need for more tests. Follow these instructions at home: Eating and drinking  Eat a diet that includes fresh fruits and vegetables, whole grains, lean protein, and low-fat dairy products. Take vitamin and mineral supplements as recommended by your health care provider. Do not drink alcohol if: Your health care provider tells you not to drink. You are pregnant, may be pregnant, or are planning to become pregnant. If you drink alcohol: Limit how much you have to 0-1 drink a day. Know how much alcohol is in your drink. In the [place] one drink equals one 12 oz bottle of beer (355 mL), one 5 oz glass of wine (148 mL), or one 1 oz glass of hard liquor (44 mL). Lifestyle Brush your teeth every morning and night with fluoride toothpaste. Floss one time each day. Exercise for at least 30 minutes 5 or more days each week. Do not use any products that contain nicotine or tobacco. These products include cigarettes, chewing tobacco, and vaping devices, such as e-cigarettes. If you need help quitting, ask your health care provider. Do not use drugs. If you are sexually active, practice safe sex. Use a condom or other form of protection to   prevent STIs. If you do not wish to become pregnant, use a form of birth control. If you plan to become pregnant, see your health care provider for a  prepregnancy visit. Take aspirin only as told by your health care provider. Make sure that you understand how much to take and what form to take. Work with your health care provider to find out whether it is safe and beneficial for you to take aspirin daily. Find healthy ways to manage stress, such as: Meditation, yoga, or listening to music. Journaling. Talking to a trusted person. Spending time with friends and family. Minimize exposure to UV radiation to reduce your risk of skin cancer. Safety Always wear your seat belt while driving or riding in a vehicle. Do not drive: If you have been drinking alcohol. Do not ride with someone who has been drinking. When you are tired or distracted. While texting. If you have been using any mind-altering substances or drugs. Wear a helmet and other protective equipment during sports activities. If you have firearms in your house, make sure you follow all gun safety procedures. Seek help if you have been physically or sexually abused. What's next? Visit your health care provider once a year for an annual wellness visit. Ask your health care provider how often you should have your eyes and teeth checked. Stay up to date on all vaccines. This information is not intended to replace advice given to you by your health care provider. Make sure you discuss any questions you have with your health care provider. Document Revised: 05/13/2021 Document Reviewed: 05/13/2021 Elsevier Patient Education  [place].

## 2022-09-02 ENCOUNTER — Ambulatory Visit (INDEPENDENT_AMBULATORY_CARE_PROVIDER_SITE_OTHER): Payer: Medicare Other | Admitting: Family Medicine

## 2022-09-02 ENCOUNTER — Encounter: Payer: Self-pay | Admitting: Family Medicine

## 2022-09-02 VITALS — BP 124/86 | HR 89 | Resp 16 | Ht 62.0 in | Wt 162.0 lb

## 2022-09-02 DIAGNOSIS — N870 Mild cervical dysplasia: Secondary | ICD-10-CM | POA: Diagnosis not present

## 2022-09-02 DIAGNOSIS — Z0000 Encounter for general adult medical examination without abnormal findings: Secondary | ICD-10-CM | POA: Diagnosis not present

## 2022-09-02 DIAGNOSIS — N951 Menopausal and female climacteric states: Secondary | ICD-10-CM | POA: Diagnosis not present

## 2022-09-02 DIAGNOSIS — Z23 Encounter for immunization: Secondary | ICD-10-CM

## 2022-09-02 MED ORDER — VENLAFAXINE HCL ER 37.5 MG PO CP24: 37.5000 mg | ORAL_CAPSULE | Freq: Every day | ORAL | 0 refills | Status: DC

## 2022-09-06 ENCOUNTER — Encounter: Payer: Self-pay | Admitting: Gastroenterology

## 2022-09-06 ENCOUNTER — Ambulatory Visit (INDEPENDENT_AMBULATORY_CARE_PROVIDER_SITE_OTHER): Payer: Medicare Other | Admitting: Gastroenterology

## 2022-09-06 VITALS — BP 137/93 | HR 81 | Temp 98.7°F | Ht 62.0 in | Wt 156.0 lb

## 2022-09-06 DIAGNOSIS — R194 Change in bowel habit: Secondary | ICD-10-CM

## 2022-09-06 DIAGNOSIS — R1012 Left upper quadrant pain: Secondary | ICD-10-CM

## 2022-09-06 DIAGNOSIS — R140 Abdominal distension (gaseous): Secondary | ICD-10-CM | POA: Diagnosis not present

## 2022-09-06 MED ORDER — DICYCLOMINE HCL 20 MG PO TABS: 20.0000 mg | ORAL_TABLET | Freq: Three times a day (TID) | ORAL | 4 refills | Status: DC

## 2022-09-06 MED ORDER — HYDROCORTISONE (PERIANAL) 2.5 % EX CREA: 1.0000 | TOPICAL_CREAM | Freq: Two times a day (BID) | CUTANEOUS | 1 refills | Status: DC

## 2022-09-06 MED ORDER — ESOMEPRAZOLE MAGNESIUM 20 MG PO CPDR: 20.0000 mg | DELAYED_RELEASE_CAPSULE | Freq: Every day | ORAL | 5 refills | Status: DC

## 2022-09-06 NOTE — Progress Notes (Signed)
Primary Care Physician: Steele Sizer, MD  Primary Gastroenterologist:  Dr. [name]  Chief Complaint  Patient presents with   Abdominal Pain    LUQ--worse after eating or drinking   Bloated    Normal to loose stools     HPI: Tammie Lopez is a 51 y.o. female here with report of mucus in his stools.  The patient had some rectal bleeding from hemorrhoids and was given cortisone cream and states the bleeding went away.  She now reports that she is out of the cream and she would like some more.  The patient denies any unexplained weight loss fevers chills nausea vomiting black stools or bloody stools.  The patient does have a history of irritable bowel syndrome. Although on her physical exam last time her symptoms were consistent with muscular skeletal pain. The patient now reports that her abdominal pain is still present and worse when she eats.  She also reports bloating with abdominal discomfort.   Past Medical History:  Diagnosis Date   Allergy    Constipation    Diverticula of colon    Family history of adverse reaction to anesthesia    mother's BP increased   Hypertension    Insomnia    Migraine    migraines   MS (multiple sclerosis) (HCC)    Muscle spasm     Current Outpatient Medications  Medication Sig Dispense Refill   amLODipine (NORVASC) 2.5 MG tablet TAKE 1 TABLET BY MOUTH EVERY DAY 90 tablet 1   Cholecalciferol (VITAMIN D) 50 MCG (2000 UT) CAPS Take 2,000 Units by mouth daily.     hydrocortisone (ANUSOL-HC) 2.5 % rectal cream Place 1 Application rectally 2 (two) times daily. 30 g 1   hyoscyamine (LEVSIN) 0.125 MG tablet Take 1 tablet (0.125 mg total) by mouth every 4 (four) hours as needed. 30 tablet 0   linaclotide (LINZESS) 290 MCG CAPS capsule Take 290 mcg by mouth daily before breakfast.     omeprazole (PRILOSEC) 40 MG capsule TAKE 1 CAPSULE BY MOUTH EVERY DAY 90 capsule 1   pregabalin (LYRICA) 50 MG capsule Take 1-3 capsules (50-150 mg total) by  mouth 3 (three) times daily. 90 capsule 0   Semaglutide, 1 MG/DOSE, 4 MG/3ML SOPN Inject 1 mg as directed once a week. 9 mL 0   traZODone (DESYREL) 50 MG tablet TAKE 1/2 TO 1 TABLET BY MOUTH AT BEDTIME AS NEEDED FOR SLEEP 90 tablet 0   Ubrogepant (UBRELVY) 100 MG TABS Take 1 tablet by mouth daily as needed. For migraine headaches 16 tablet 1   venlafaxine XR (EFFEXOR XR) 37.5 MG 24 hr capsule Take 1 capsule (37.5 mg total) by mouth daily with breakfast. 30 capsule 0   No current facility-administered medications for this visit.    Allergies as of 09/06/2022 - Review Complete 09/02/2022  Allergen Reaction Noted   Penicillins Hives 08/26/2015    ROS:  General: Negative for anorexia, weight loss, fever, chills, fatigue, weakness. ENT: Negative for hoarseness, difficulty swallowing , nasal congestion. CV: Negative for chest pain, angina, palpitations, dyspnea on exertion, peripheral edema.  Respiratory: Negative for dyspnea at rest, dyspnea on exertion, cough, sputum, wheezing.  GI: See history of present illness. GU:  Negative for dysuria, hematuria, urinary incontinence, urinary frequency, nocturnal urination.  Endo: Negative for unusual weight change.    Physical Examination:   There were no vitals taken for this visit.  General: Well-nourished, well-developed in no acute distress.  Eyes: No icterus. Conjunctivae  pink. Lungs: Clear to auscultation bilaterally. Non-labored. Heart: Regular rate and rhythm, no murmurs rubs or gallops.  Abdomen: Bowel sounds are normal, nontender, nondistended, no hepatosplenomegaly or masses, no abdominal bruits or hernia , no rebound or guarding.   Extremities: No lower extremity edema. No clubbing or deformities. Neuro: Alert and oriented x 3.  Grossly intact. Skin: Warm and dry, no jaundice.   Psych: Alert and cooperative, normal mood and affect.  Labs:    Imaging Studies: MR ANKLE RIGHT WO CONTRAST  Result Date: 08/10/2022 CLINICAL DATA:   Patient missed a step on uneven ground resulting in twisting right ankle prior to fall. EXAM: MRI OF THE RIGHT ANKLE WITHOUT CONTRAST TECHNIQUE: Multiplanar, multisequence MR imaging of the ankle was performed. No intravenous contrast was administered. COMPARISON:  Radiographs dated Apr 06, 2022 FINDINGS: TENDONS Peroneal: Peroneal longus tendon intact. Peroneal brevis intact. Posteromedial: Posterior tibial tendon intact. Flexor hallucis longus tendon intact. Flexor digitorum longus tendon intact. Anterior: Tibialis anterior tendon intact. Extensor hallucis longus tendon intact Extensor digitorum longus tendon intact. Achilles:  Intact. Plantar Fascia: Intact. LIGAMENTS Lateral: Trace amount of fluid adjacent to the anterior talofibular ligament without evidence of ligamentous tear suggesting grade 1 sprain. Calcaneofibular ligament intact. Posterior talofibular ligament intact. Anterior and posterior tibiofibular ligaments intact. Medial: Deltoid ligament intact. Spring ligament intact. CARTILAGE Ankle Joint: No joint effusion. Normal ankle mortise. No chondral defect. Subtalar Joints/Sinus Tarsi: Normal subtalar joints. No subtalar joint effusion. Normal sinus tarsi. Bones: No marrow signal abnormality.  No fracture or dislocation. Soft Tissue: No fluid collection or hematoma. Muscles are normal without edema or atrophy. Tarsal tunnel is normal. IMPRESSION: 1.  No evidence of fracture or dislocation. 2. Mild edema about the anterior talofibular ligament suggesting grade 1 ligamentous sprain without evidence of tear. 3. Tendons of the flexor, extensor and peroneal compartments are intact. Achilles tendon is intact. Electronically Signed   By: Keane Police D.O.   On: 08/10/2022 13:30    Assessment and Plan:   Tammie Lopez is a 51 y.o. y/o female who comes in today with a history of continued abdominal pain with a colonoscopy not show any cause for abdominal pain.  The patient also has bloating in addition to  her abdominal pain. She will be started on dicyclomine 20 mg 3 times a day.  The patient will be notified of the CT scan results.  The patient has been explained the plan and agrees with it.     [name], MD. Marval Regal    Note: This dictation was prepared with Dragon dictation along with smaller phrase technology. Any transcriptional errors that result from this process are unintentional.

## 2022-09-15 ENCOUNTER — Ambulatory Visit
Admission: RE | Admit: 2022-09-15 | Discharge: 2022-09-15 | Disposition: A | Discharge status: Home / Self Care | Payer: Medicare Other | Source: Ambulatory Visit | Attending: Gastroenterology | Admitting: Gastroenterology

## 2022-09-15 DIAGNOSIS — R1012 Left upper quadrant pain: Secondary | ICD-10-CM | POA: Diagnosis not present

## 2022-09-15 DIAGNOSIS — R140 Abdominal distension (gaseous): Secondary | ICD-10-CM | POA: Insufficient documentation

## 2022-09-15 DIAGNOSIS — N200 Calculus of kidney: Secondary | ICD-10-CM | POA: Diagnosis not present

## 2022-09-15 DIAGNOSIS — R194 Change in bowel habit: Secondary | ICD-10-CM | POA: Insufficient documentation

## 2022-09-15 DIAGNOSIS — K8020 Calculus of gallbladder without cholecystitis without obstruction: Secondary | ICD-10-CM | POA: Diagnosis not present

## 2022-09-15 LAB — POCT I-STAT CREATININE: Creatinine, Ser: 0.6 mg/dL (ref 0.44–1.00)

## 2022-09-15 MED ORDER — IOHEXOL 300 MG/ML  SOLN
100.0000 mL | Freq: Once | INTRAMUSCULAR | Status: AC | PRN
  Administered 2022-09-15: 100 mL via INTRAVENOUS

## 2022-09-20 DIAGNOSIS — Z79899 Other long term (current) drug therapy: Secondary | ICD-10-CM | POA: Diagnosis not present

## 2022-09-20 DIAGNOSIS — G35 Multiple sclerosis: Secondary | ICD-10-CM | POA: Diagnosis not present

## 2022-09-20 DIAGNOSIS — R2681 Unsteadiness on feet: Secondary | ICD-10-CM | POA: Diagnosis not present

## 2022-09-21 ENCOUNTER — Encounter: Payer: Self-pay | Admitting: Gastroenterology

## 2022-09-24 ENCOUNTER — Other Ambulatory Visit: Payer: Self-pay | Admitting: Family Medicine

## 2022-09-24 DIAGNOSIS — N951 Menopausal and female climacteric states: Secondary | ICD-10-CM

## 2022-09-24 NOTE — Telephone Encounter (Signed)
Wants 90 days

## 2022-09-28 DIAGNOSIS — H524 Presbyopia: Secondary | ICD-10-CM | POA: Diagnosis not present

## 2022-10-01 DIAGNOSIS — N941 Unspecified dyspareunia: Secondary | ICD-10-CM | POA: Diagnosis not present

## 2022-10-01 DIAGNOSIS — N870 Mild cervical dysplasia: Secondary | ICD-10-CM | POA: Diagnosis not present

## 2022-10-01 DIAGNOSIS — N952 Postmenopausal atrophic vaginitis: Secondary | ICD-10-CM | POA: Diagnosis not present

## 2022-11-07 LAB — HEMOGLOBIN A1C: Hemoglobin A1C: 5.3

## 2022-11-24 NOTE — Progress Notes (Unsigned)
Name: Tammie Lopez   MRN: [id number]    DOB: 12-19-1970   Date:11/25/2022       Progress Note  Subjective  Chief Complaint  Sciatica Pain/ Rash  HPI  Left upper abdominal pain: she states worse with movement, like stretching, severe pain when trying to get out of bed. She is tired of taking medications and the pain is affecting her mood. She has MS and has two lesions on left posterolateral T2 and T 6 , she states pain resolves when she applies pressure to the are and stays still . Discussed referral to pain doctor, ortho or psychiatrist to consider nerve block. She has been evaluated by GI and explained not GI related. Reviewed CT with patient She denies problems breathing, soreness radiates to her back   Chronic low back pain with radiculitis: she states pain on left lower back is getting worse and radiating down to foot. She has rx for Lyrica at home but has not been taking it . She was seeing Dr. Joyce Copa chiropractor but is no longer helping with symptoms. Discussed trying muscle relaxer and nsaid;s   Intertrigo: intermittent under abdominal fold, she used topical medication and resolved.   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 07/16/2020   Low TSH level 09/19/2019   Obesity (BMI 30-39.9) 12/06/2017   Insulin resistance 09/27/2017   Essential hypertension 09/21/2017   Right sided sciatica 09/21/2017   Major depression, recurrent ([place]) 05/24/2017   Migraine without aura, not intractable 12/09/2016   Nickel allergy 12/09/2016   GERD without esophagitis 12/09/2016   Other neutropenia ([place]) 12/09/2016   Dyslipidemia 12/09/2016   Diverticulosis 12/09/2016   Proctitis 12/09/2016   Allergic rhinitis, seasonal 12/09/2016   Chronic constipation 12/09/2016   Vitamin D deficiency 08/06/2013   MS (multiple  sclerosis) ([place]) 04/17/2013    Past Surgical History:  Procedure Laterality Date   BREAST BIOPSY Left 01/06/2018    2 areas  neg   BUNIONECTOMY     CESAREAN SECTION N/A 2006   COLONOSCOPY WITH PROPOFOL N/A 02/09/2021   Procedure: COLONOSCOPY WITH PROPOFOL;  Surgeon: [name], MD;  Location: [facility];  Service: Endoscopy;  Laterality: N/A;   GANGLION CYST EXCISION Bilateral    Hands   POLYPECTOMY N/A 02/09/2021   Procedure: POLYPECTOMY;  Surgeon: [name], MD;  Location: [facility];  Service: Endoscopy;  Laterality: N/A;    Family History  Problem Relation Age of Onset   Diabetes Mother    Hypertension Mother    Cancer Father 53       Mouth   COPD Father    Hypertension Brother    Diabetes Brother    Breast cancer Neg Hx     Social History   Tobacco Use   Smoking status: Never   Smokeless tobacco: Never   Tobacco comments:    smoking cessation materials not required  Substance Use Topics   Alcohol use: No     Current Outpatient Medications:    amLODipine (NORVASC) 2.5 MG tablet, TAKE 1 TABLET BY MOUTH EVERY DAY, Disp: 90 tablet, Rfl: 1   Cholecalciferol (VITAMIN D) 50 MCG (2000 UT) CAPS, Take 2,000 Units by mouth daily., Disp: , Rfl:    diclofenac (VOLTAREN) 75 MG EC tablet, Take 1 tablet (75 mg total) by mouth 2 (  two) times daily., Disp: 30 tablet, Rfl: 0   dicyclomine (BENTYL) 20 MG tablet, Take 1 tablet (20 mg total) by mouth 3 (three) times daily before meals., Disp: 30 tablet, Rfl: 4   esomeprazole (NEXIUM) 20 MG capsule, Take 1 capsule (20 mg total) by mouth daily at 12 noon., Disp: 30 capsule, Rfl: 5   estradiol (ESTRACE) 0.1 MG/GM vaginal cream, SMARTSIG:2 Gram(s) Vaginal Twice a Week, Disp: , Rfl:    hydrocortisone (ANUSOL-HC) 2.5 % rectal cream, Place 1 Application rectally 2 (two) times daily., Disp: 30 g, Rfl: 1   hyoscyamine (LEVSIN) 0.125 MG tablet, Take 1 tablet (0.125 mg total) by mouth every 4 (four) hours as needed., Disp: 30  tablet, Rfl: 0   omeprazole (PRILOSEC) 40 MG capsule, TAKE 1 CAPSULE BY MOUTH EVERY DAY, Disp: 90 capsule, Rfl: 1   Plecanatide (TRULANCE) 3 MG TABS, Take by mouth., Disp: , Rfl:    pregabalin (LYRICA) 50 MG capsule, Take 1-3 capsules (50-150 mg total) by mouth 3 (three) times daily., Disp: 90 capsule, Rfl: 0   Semaglutide, 1 MG/DOSE, 4 MG/3ML SOPN, Inject 1 mg as directed once a week., Disp: 9 mL, Rfl: 0   tiZANidine (ZANAFLEX) 2 MG tablet, Take 1 tablet (2 mg total) by mouth every 6 (six) hours as needed for muscle spasms., Disp: 30 tablet, Rfl: 0   traZODone (DESYREL) 50 MG tablet, TAKE 1/2 TO 1 TABLET BY MOUTH AT BEDTIME AS NEEDED FOR SLEEP, Disp: 90 tablet, Rfl: 0   Ubrogepant (UBRELVY) 100 MG TABS, Take 1 tablet by mouth daily as needed. For migraine headaches, Disp: 16 tablet, Rfl: 1   venlafaxine XR (EFFEXOR-XR) 37.5 MG 24 hr capsule, TAKE 1 CAPSULE BY MOUTH DAILY WITH BREAKFAST., Disp: 90 capsule, Rfl: 1  Allergies  Allergen Reactions   Penicillins Hives    I personally reviewed active problem list, medication list, allergies, family history, social history, health maintenance with the patient/caregiver today.   ROS  Ten systems reviewed and is negative except as mentioned in HPI   Objective  Vitals:   11/25/22 0855  BP: 136/88  Pulse: 87  Resp: 14  Temp: 98.2 F (36.8 C)  TempSrc: Oral  SpO2: 98%  Weight: 160 lb (72.6 kg)  Height: '5\' 2"'$  (1.575 m)    Body mass index is 29.26 kg/m.  Physical Exam  Constitutional: Patient appears well-developed and well-nourished.  No distress.  HEENT: head atraumatic, normocephalic, pupils equal and reactive to light, neck supple Cardiovascular: Normal rate, regular rhythm and normal heart sounds.  No murmur heard. No BLE edema. Pulmonary/Chest: Effort normal and breath sounds normal. No respiratory distress. Abdominal: Soft.  There is no tenderness. Muscular skeletal: tender to touch on LUQ and radiates to left upper back,  negative straight leg raise.  Psychiatric: Patient has a normal mood and affect. behavior is normal. Judgment and thought content normal.   Recent Results (from the past 2160 hour(s))  I-STAT creatinine     Status: None   Collection Time: 09/15/22  8:51 AM  Result Value Ref Range   Creatinine, Ser 0.60 0.44 - 1.00 mg/dL    PHQ2/9:    11/25/2022    8:57 AM 09/02/2022   10:02 AM 08/25/2022   10:59 AM 05/28/2022   11:09 AM 02/15/2022    9:02 AM  Depression screen PHQ 2/9  Decreased Interest 0 0 0 0 0  Down, Depressed, Hopeless 0 0 0 0 0  PHQ - 2 Score 0 0 0 0 0  Altered sleeping 0 3 0 3 3  Tired, decreased energy 0 0 0 0 3  Change in appetite 0 0 0 0 0  Feeling bad or failure about yourself  0 0 0 0 0  Trouble concentrating 0 0 0 0 0  Moving slowly or fidgety/restless 0 0 0 0 0  Suicidal thoughts 0 0 0 0 0  PHQ-9 Score 0 3 0 3 6    phq 9 is negative   Fall Risk:    11/25/2022    8:57 AM 09/02/2022   10:02 AM 08/25/2022   10:59 AM 05/28/2022   11:09 AM 02/15/2022    8:58 AM  Fall Risk   Falls in the past year? 0 1 1 0 0  Number falls in past yr:  0 0 0 0  Injury with Fall?  1 1 0 0  Risk for fall due to : No Fall Risks No Fall Risks History of fall(s) No Fall Risks No Fall Risks  Follow up Falls prevention discussed;Education provided;Falls evaluation completed Falls prevention discussed Falls prevention discussed;Education provided;Falls evaluation completed Falls prevention discussed Falls prevention discussed      Functional Status Survey: Is the patient deaf or have difficulty hearing?: No Does the patient have difficulty seeing, even when wearing glasses/contacts?: No Does the patient have difficulty concentrating, remembering, or making decisions?: No Does the patient have difficulty walking or climbing stairs?: Yes Does the patient have difficulty dressing or bathing?: Yes Does the patient have difficulty doing errands alone such as visiting a doctor's office or  shopping?: No    Assessment & Plan  1. Muscular abdominal pain in left upper quadrant  - Ambulatory referral to Orthopedic Surgery - diclofenac (VOLTAREN) 75 MG EC tablet; Take 1 tablet (75 mg total) by mouth 2 (two) times daily.  Dispense: 30 tablet; Refill: 0 - tiZANidine (ZANAFLEX) 2 MG tablet; Take 1 tablet (2 mg total) by mouth every 6 (six) hours as needed for muscle spasms.  Dispense: 30 tablet; Refill: 0  Discussed cognitive behavioral therapy   2. Chronic bilateral low back pain with left-sided sciatica  - Ambulatory referral to Orthopedic Surgery - diclofenac (VOLTAREN) 75 MG EC tablet; Take 1 tablet (75 mg total) by mouth 2 (two) times daily.  Dispense: 30 tablet; Refill: 0 - tiZANidine (ZANAFLEX) 2 MG tablet; Take 1 tablet (2 mg total) by mouth every 6 (six) hours as needed for muscle spasms.  Dispense: 30 tablet; Refill: 0   3. Intertrigo  Discussed topical anti-fungal

## 2022-11-25 ENCOUNTER — Ambulatory Visit (INDEPENDENT_AMBULATORY_CARE_PROVIDER_SITE_OTHER): Payer: Medicare Other | Admitting: Family Medicine

## 2022-11-25 ENCOUNTER — Encounter: Payer: Self-pay | Admitting: Family Medicine

## 2022-11-25 VITALS — BP 136/88 | HR 87 | Temp 98.2°F | Resp 14 | Ht 62.0 in | Wt 160.0 lb

## 2022-11-25 DIAGNOSIS — L304 Erythema intertrigo: Secondary | ICD-10-CM | POA: Diagnosis not present

## 2022-11-25 DIAGNOSIS — M7918 Myalgia, other site: Secondary | ICD-10-CM

## 2022-11-25 DIAGNOSIS — M5442 Lumbago with sciatica, left side: Secondary | ICD-10-CM | POA: Diagnosis not present

## 2022-11-25 DIAGNOSIS — G8929 Other chronic pain: Secondary | ICD-10-CM | POA: Diagnosis not present

## 2022-11-25 MED ORDER — DICLOFENAC SODIUM 75 MG PO TBEC: 75.0000 mg | DELAYED_RELEASE_TABLET | Freq: Two times a day (BID) | ORAL | 0 refills | Status: DC

## 2022-11-25 MED ORDER — TIZANIDINE HCL 2 MG PO TABS: 2.0000 mg | ORAL_TABLET | Freq: Four times a day (QID) | ORAL | 0 refills | Status: DC | PRN

## 2022-11-25 NOTE — Patient Instructions (Signed)
Cognitive Behavioral Therapy

## 2022-12-01 ENCOUNTER — Other Ambulatory Visit: Payer: Self-pay | Admitting: Family Medicine

## 2022-12-01 DIAGNOSIS — M7918 Myalgia, other site: Secondary | ICD-10-CM

## 2022-12-01 DIAGNOSIS — G8929 Other chronic pain: Secondary | ICD-10-CM

## 2022-12-02 NOTE — Progress Notes (Deleted)
Name: Tammie Lopez   MRN: [id number]    DOB: 04-21-1971   Date:12/02/2022       Progress Note  Subjective  Chief Complaint  Follow Up  HPI  HTN: BP is at goal. She has been compliant with medications now. She denies chest pain, dizziness or palpitation  BP is at goal    MS: she has been off Tacfidera due to leucopenia, she was going to [facility] neurology, and was taking  Plegridy. She is now seeing Dr. Annamaria Helling  at [facility] and has been off all medications, they will discuss therapy during upcoming visit in October. Right leg weakness improved with PT   Low back pain: going on for past couple of years, improves with meloxicam pain is aching like and constant, can radiate to pelvic area but not her legs. She has not  been to Dr. Joyce Copa , but had PT and is doing better   Obesity: she has insulin resistance, took Trulicity,  and Metformin in the past , started [facility]  October 2022 and went up to 1 mg March 2023 weight was trending down but stopped taking it on her own about one month ago and weight is up again.   Pre-diabetes: last A1C improved,  last level was 5.6 % .  She has  HTN and increase in abdominal girth, she stopped Ozempic due to constipation, but she states she will try to resume it    Depression: she is frustrated about inability to lose weight, we will go up on dose of Ozempic today. She is also very concerned about abdominal fold, getting married next year and states not feeling good about her body image and does not want to have sex. She states she has a friend that had abdominal panniculectomy and she would like to go see Psychiatric nurse in [place], Dr. Claudia Desanctis, she has not seen him yet. She states feeling better lately   LUQ pain: seen by Dr. Allen Norris last September she was given Linzess but states cannot take it daily because it causes diarrhea. We changed in to Trulance but she is not sure if it worked since she has been taking Dulcolax, and active medication still says Linzess  but she states she has some trulance at home and will try it out  . She continues to have bloating and distention of left upper quadrant that is worse after meals, causes discomfort. It improves with a bowel movement. She has a follow up coming up with Dr. Allen Norris soon   Right ankle sprain : injury in May, seen by Ortho and was given a boot, still has pain and swelling.   Migraine headaches: takes Ubrelvy prn , episodes of headaches are down to 2-3 times per month, she has associated photophobia but denies associated nausea or vomiting   URI: she has noticed some rhinorrhea, sneezing, no chills or fever. She did a home covid test that was negative. 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 07/16/2020   Low TSH level 09/19/2019   Obesity (BMI 30-39.9) 12/06/2017   Insulin resistance 09/27/2017   Essential hypertension 09/21/2017  Right sided sciatica 09/21/2017   Major depression, recurrent ([place]) 05/24/2017   Migraine without aura, not intractable 12/09/2016   Nickel allergy 12/09/2016   GERD without esophagitis 12/09/2016   Other neutropenia ([place]) 12/09/2016   Dyslipidemia 12/09/2016   Diverticulosis 12/09/2016   Proctitis 12/09/2016   Allergic rhinitis, seasonal 12/09/2016   Chronic constipation 12/09/2016   Vitamin D deficiency 08/06/2013   MS (multiple sclerosis) ( [place]) 04/17/2013    Past Surgical History:  Procedure Laterality Date   BREAST BIOPSY Left 01/06/2018    2 areas  neg   BUNIONECTOMY     CESAREAN SECTION N/A 2006   COLONOSCOPY WITH PROPOFOL N/A 02/09/2021   Procedure: COLONOSCOPY  WITH PROPOFOL;  Surgeon: [name], MD;  Location: [facility];  Service: Endoscopy;  Laterality: N/A;   GANGLION CYST EXCISION Bilateral    Hands   POLYPECTOMY N/A 02/09/2021   Procedure: POLYPECTOMY;  Surgeon: [name], MD;  Location: [facility];  Service: Endoscopy;  Laterality: N/A;    Family History  Problem Relation Age of Onset   Diabetes Mother    Hypertension Mother    Cancer Father 44       Mouth   COPD Father    Hypertension Brother    Diabetes Brother    Breast cancer Neg Hx     Social History   Tobacco Use   Smoking status: Never   Smokeless tobacco: Never   Tobacco comments:    smoking cessation materials not required  Substance Use Topics   Alcohol use: No     Current Outpatient Medications:    amLODipine (NORVASC) 2.5 MG tablet, TAKE 1 TABLET BY MOUTH EVERY DAY, Disp: 90 tablet, Rfl: 1   Cholecalciferol (VITAMIN D) 50 MCG (2000 UT) CAPS, Take 2,000 Units by mouth daily., Disp: , Rfl:    diclofenac (VOLTAREN) 75 MG EC tablet, Take 1 tablet (75 mg total) by mouth 2 (two) times daily., Disp: 30 tablet, Rfl: 0   dicyclomine (BENTYL) 20 MG tablet, Take 1 tablet (20 mg total) by mouth 3 (three) times daily before meals., Disp: 30 tablet, Rfl: 4   esomeprazole (NEXIUM) 20 MG capsule, Take 1 capsule (20 mg total) by mouth daily at 12 noon., Disp: 30 capsule, Rfl: 5   estradiol (ESTRACE) 0.1 MG/GM vaginal cream, SMARTSIG:2 Gram(s) Vaginal Twice a Week, Disp: , Rfl:    hydrocortisone (ANUSOL-HC) 2.5 % rectal cream, Place 1 Application rectally 2 (two) times daily., Disp: 30 g, Rfl: 1   hyoscyamine (LEVSIN) 0.125 MG tablet, Take 1 tablet (0.125 mg total) by mouth every 4 (four) hours as needed., Disp: 30 tablet, Rfl: 0   omeprazole (PRILOSEC) 40 MG capsule, TAKE 1 CAPSULE BY MOUTH EVERY DAY, Disp: 90 capsule, Rfl: 1   Plecanatide (TRULANCE) 3 MG TABS, Take by mouth., Disp: , Rfl:    pregabalin (LYRICA) 50 MG capsule, Take 1-3 capsules (50-150 mg  total) by mouth 3 (three) times daily., Disp: 90 capsule, Rfl: 0   Semaglutide, 1 MG/DOSE, 4 MG/3ML SOPN, Inject 1 mg as directed once a week., Disp: 9 mL, Rfl: 0   tiZANidine (ZANAFLEX) 2 MG tablet, Take 1 tablet (2 mg total) by mouth every 6 (six) hours as needed for muscle spasms., Disp: 30 tablet, Rfl: 0   traZODone (DESYREL) 50 MG tablet, TAKE 1/2 TO 1 TABLET BY MOUTH AT BEDTIME AS NEEDED FOR SLEEP, Disp: 90 tablet, Rfl: 0   Ubrogepant (UBRELVY) 100 MG TABS, Take 1 tablet by mouth daily as  needed. For migraine headaches, Disp: 16 tablet, Rfl: 1   venlafaxine XR (EFFEXOR-XR) 37.5 MG 24 hr capsule, TAKE 1 CAPSULE BY MOUTH DAILY WITH BREAKFAST., Disp: 90 capsule, Rfl: 1  Allergies  Allergen Reactions   Penicillins Hives    I personally reviewed active problem list, medication list, allergies, family history, social history, health maintenance with the patient/caregiver today.   ROS  ***  Objective  There were no vitals filed for this visit.  There is no height or weight on file to calculate BMI.  Physical Exam ***  Recent Results (from the past 2160 hour(s))  I-STAT creatinine     Status: None   Collection Time: 09/15/22  8:51 AM  Result Value Ref Range   Creatinine, Ser 0.60 0.44 - 1.00 mg/dL  Hemoglobin A1c     Status: None   Collection Time: 11/07/22 12:00 AM  Result Value Ref Range   Hemoglobin A1C 5.3     PHQ2/9:    11/25/2022    8:57 AM 09/02/2022   10:02 AM 08/25/2022   10:59 AM 05/28/2022   11:09 AM 02/15/2022    9:02 AM  Depression screen PHQ 2/9  Decreased Interest 0 0 0 0 0  Down, Depressed, Hopeless 0 0 0 0 0  PHQ - 2 Score 0 0 0 0 0  Altered sleeping 0 3 0 3 3  Tired, decreased energy 0 0 0 0 3  Change in appetite 0 0 0 0 0  Feeling bad or failure about yourself  0 0 0 0 0  Trouble concentrating 0 0 0 0 0  Moving slowly or fidgety/restless 0 0 0 0 0  Suicidal thoughts 0 0 0 0 0  PHQ-9 Score 0 3 0 3 6    phq 9 is {gen pos [id number]}   Fall  Risk:    11/25/2022    8:57 AM 09/02/2022   10:02 AM 08/25/2022   10:59 AM 05/28/2022   11:09 AM 02/15/2022    8:58 AM  Fall Risk   Falls in the past year? 0 1 1 0 0  Number falls in past yr:  0 0 0 0  Injury with Fall?  1 1 0 0  Risk for fall due to : No Fall Risks No Fall Risks History of fall(s) No Fall Risks No Fall Risks  Follow up Falls prevention discussed;Education provided;Falls evaluation completed Falls prevention discussed Falls prevention discussed;Education provided;Falls evaluation completed Falls prevention discussed Falls prevention discussed      Functional Status Survey:      Assessment & Plan  *** There are no diagnoses linked to this encounter.

## 2022-12-03 ENCOUNTER — Ambulatory Visit: Payer: Medicare Other | Admitting: Family Medicine

## 2022-12-06 ENCOUNTER — Ambulatory Visit: Payer: Medicare Other | Admitting: Gastroenterology

## 2022-12-13 DIAGNOSIS — M5442 Lumbago with sciatica, left side: Secondary | ICD-10-CM | POA: Diagnosis not present

## 2022-12-13 DIAGNOSIS — M4807 Spinal stenosis, lumbosacral region: Secondary | ICD-10-CM | POA: Diagnosis not present

## 2022-12-17 ENCOUNTER — Other Ambulatory Visit: Payer: Self-pay | Admitting: Orthopedic Surgery

## 2022-12-17 DIAGNOSIS — M5442 Lumbago with sciatica, left side: Secondary | ICD-10-CM

## 2022-12-17 DIAGNOSIS — M4807 Spinal stenosis, lumbosacral region: Secondary | ICD-10-CM

## 2022-12-21 ENCOUNTER — Ambulatory Visit
Admission: RE | Admit: 2022-12-21 | Discharge: 2022-12-21 | Disposition: A | Discharge status: Home / Self Care | Payer: Medicare Other | Source: Ambulatory Visit | Attending: Orthopedic Surgery | Admitting: Orthopedic Surgery

## 2022-12-21 DIAGNOSIS — R200 Anesthesia of skin: Secondary | ICD-10-CM | POA: Diagnosis not present

## 2022-12-21 DIAGNOSIS — M4807 Spinal stenosis, lumbosacral region: Secondary | ICD-10-CM | POA: Diagnosis not present

## 2022-12-21 DIAGNOSIS — M5442 Lumbago with sciatica, left side: Secondary | ICD-10-CM | POA: Insufficient documentation

## 2022-12-21 DIAGNOSIS — M545 Low back pain, unspecified: Secondary | ICD-10-CM | POA: Diagnosis not present

## 2023-01-05 ENCOUNTER — Encounter: Payer: Self-pay | Admitting: Family Medicine

## 2023-01-07 ENCOUNTER — Other Ambulatory Visit: Payer: Self-pay | Admitting: Family Medicine

## 2023-01-07 ENCOUNTER — Telehealth: Payer: Self-pay | Admitting: Family Medicine

## 2023-01-07 DIAGNOSIS — L304 Erythema intertrigo: Secondary | ICD-10-CM

## 2023-01-07 MED ORDER — FLUCONAZOLE 150 MG PO TABS: 150.0000 mg | ORAL_TABLET | ORAL | 0 refills | Status: DC

## 2023-01-07 NOTE — Telephone Encounter (Signed)
Says she discussed receiving Diflucan with her PCP yesterday via mychart for her symptoms. Has contacted the pharmacy and nothing is there for her  [id number] [place], [place] [address] [id number] Phone: [phone]: [phone]

## 2023-01-21 ENCOUNTER — Encounter: Payer: Self-pay | Admitting: Emergency Medicine

## 2023-01-21 ENCOUNTER — Ambulatory Visit
Admission: EM | Admit: 2023-01-21 | Discharge: 2023-01-21 | Disposition: A | Discharge status: Home / Self Care | Payer: Medicare Other | Attending: Family Medicine | Admitting: Family Medicine

## 2023-01-21 ENCOUNTER — Ambulatory Visit (INDEPENDENT_AMBULATORY_CARE_PROVIDER_SITE_OTHER): Payer: Medicare Other

## 2023-01-21 ENCOUNTER — Other Ambulatory Visit: Payer: Self-pay | Admitting: Family Medicine

## 2023-01-21 DIAGNOSIS — M25521 Pain in right elbow: Secondary | ICD-10-CM | POA: Diagnosis not present

## 2023-01-21 DIAGNOSIS — I10 Essential (primary) hypertension: Secondary | ICD-10-CM

## 2023-01-21 DIAGNOSIS — M79631 Pain in right forearm: Secondary | ICD-10-CM

## 2023-01-21 DIAGNOSIS — M7031 Other bursitis of elbow, right elbow: Secondary | ICD-10-CM

## 2023-01-21 MED ORDER — PREDNISONE 10 MG (21) PO TBPK: ORAL_TABLET | Freq: Every day | ORAL | 0 refills | Status: DC

## 2023-01-21 MED ORDER — NAPROXEN 500 MG PO TABS: 500.0000 mg | ORAL_TABLET | Freq: Two times a day (BID) | ORAL | 0 refills | Status: DC

## 2023-01-21 MED ORDER — ACETAMINOPHEN 325 MG PO TABS: 975.0000 mg | ORAL_TABLET | Freq: Four times a day (QID) | ORAL | Status: DC | PRN

## 2023-01-21 MED ORDER — ACETAMINOPHEN 325 MG PO TABS
975.0000 mg | ORAL_TABLET | Freq: Once | ORAL | Status: AC
  Administered 2023-01-21: 975 mg via ORAL

## 2023-01-21 MED ORDER — ACETAMINOPHEN 500 MG PO TABS: 925.0000 mg | ORAL_TABLET | Freq: Four times a day (QID) | ORAL | Status: DC | PRN

## 2023-01-21 MED ORDER — METHOCARBAMOL 500 MG PO TABS: 500.0000 mg | ORAL_TABLET | Freq: Two times a day (BID) | ORAL | 0 refills | Status: DC

## 2023-01-21 NOTE — ED Triage Notes (Signed)
Patient c/o right elbow and right forearm pain for a week.  Patient denies injury or fall.  Patient has been taking ibuprofen for the past but has not helped.

## 2023-01-21 NOTE — Discharge Instructions (Addendum)
Your xray did not show any broken or dislocated bones.  See handout on elbow bursitis.   Take Tylenol 1500 mg with Naprosyn twice a day. Apply a lidocaine patch for additional relief.  Consider purchasing a elbow compression sleeve to help with pain. You can purchase one online or at [employer].   If pain does not improve in the next 2 weeks, follow up with an orthopedic provider like Dr Rosette Reveal or [facility] in [place].

## 2023-01-21 NOTE — ED Provider Notes (Signed)
[facility]    CSN: [id number] Arrival date & time: 01/21/23  0830      History   Chief Complaint Chief Complaint  Patient presents with   Elbow Pain   Arm Pain    HPI  HPI Tammie Lopez is a 52 y.o. female.   Brigetta presents for right elbow and forearm pain that started about a week ago. Pain is getting worse. No known injury, fall or trauma. Pain worse with extending her arm and wraps around her elbow.  She put a BioFreeze patch on it yesterday but that did not help. She has been taking Advil. 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  She has never had pain like this before. She does not play tennis or golf. She is a hair stylist. She is left handed.     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 07/16/2020   Low TSH level 09/19/2019   Obesity (BMI 30-39.9) 12/06/2017   Insulin resistance 09/27/2017   Essential hypertension 09/21/2017   Right sided sciatica 09/21/2017   Major depression, recurrent ([place]) 05/24/2017   Migraine without aura, not intractable 12/09/2016   Nickel allergy 12/09/2016   GERD without esophagitis 12/09/2016   Other neutropenia ([place]) 12/09/2016   Dyslipidemia 12/09/2016   Diverticulosis 12/09/2016   Proctitis 12/09/2016   Allergic rhinitis, seasonal 12/09/2016   Chronic constipation  12/09/2016   Vitamin D deficiency 08/06/2013   MS (multiple sclerosis) ([place]) 04/17/2013    Past Surgical History:  Procedure Laterality Date   BREAST BIOPSY Left 01/06/2018    2 areas  neg   BUNIONECTOMY     CESAREAN SECTION N/A 2006   COLONOSCOPY WITH PROPOFOL N/A 02/09/2021   Procedure: COLONOSCOPY WITH PROPOFOL;  Surgeon: [name], MD;  Location: [facility];  Service: Endoscopy;  Laterality: N/A;   GANGLION CYST EXCISION Bilateral    Hands   POLYPECTOMY N/A 02/09/2021   Procedure: POLYPECTOMY;  Surgeon: [name], MD;  Location: [facility];  Service: Endoscopy;  Laterality: N/A;    OB History   No obstetric history on file.      Home Medications    Prior to Admission medications   Medication Sig Start Date End Date Taking? Authorizing Provider  amLODipine (NORVASC) 2.5 MG tablet TAKE 1 TABLET BY MOUTH EVERY DAY 08/22/22  Yes Sowles, Drue Stager, MD  Cholecalciferol (VITAMIN D) 50 MCG (2000 UT) CAPS Take 2,000 Units by mouth daily.   Yes [provider]  fluconazole (DIFLUCAN) 150 MG tablet Take 1 tablet (150 mg total) by mouth every other day. 01/07/23   Steele Sizer, MD  methocarbamol (ROBAXIN) 500 MG tablet Take 1 tablet (500 mg total) by mouth 2 (two) times daily. 01/21/23  Yes Toyna Erisman, DO  naproxen (NAPROSYN) 500 MG tablet Take 1 tablet (500 mg total) by mouth 2 (two) times daily with a meal. 01/21/23  Yes Tilton Marsalis, DO  predniSONE (STERAPRED UNI-PAK 21 TAB) 10 MG (21) TBPK tablet Take by mouth daily. Take 6 tabs by mouth daily for 1, then 5 tabs for 1 day, then 4 tabs for 1 day, then 3 tabs for 1 day, then 2 tabs for 1 day, then 1 tab for 1 day. 01/21/23  Yes Kree Armato, DO  dicyclomine (BENTYL) 20 MG tablet Take 1 tablet (20 mg total) by mouth 3 (three) times daily before meals. 09/06/22   [name], MD  esomeprazole (NEXIUM) 20 MG capsule Take 1 capsule (20 mg total) by mouth daily at 12 noon. 09/06/22   [name], MD   estradiol (ESTRACE) 0.1 MG/GM vaginal cream SMARTSIG:2 Gram(s) Vaginal Twice a Week 10/01/22   [provider]  hydrocortisone (ANUSOL-HC) 2.5 % rectal cream Place 1 Application rectally 2 (two) times daily. 09/06/22   [name], MD  hyoscyamine (LEVSIN) 0.125 MG tablet Take 1 tablet (0.125 mg total) by mouth every 4 (four) hours as needed. 02/15/22   Steele Sizer, MD  omeprazole (PRILOSEC) 40 MG capsule TAKE 1 CAPSULE BY MOUTH EVERY DAY 12/17/21   Ancil Boozer, Drue Stager, MD  Plecanatide (TRULANCE) 3 MG TABS Take by mouth.    [provider]  pregabalin (LYRICA) 50 MG capsule Take 1-3 capsules (50-150 mg total) by mouth 3 (three) times daily. 12/17/21   Steele Sizer, MD  Semaglutide, 1 MG/DOSE, 4 MG/3ML SOPN Inject 1 mg as directed once a week. 05/28/22   Steele Sizer, MD  tiZANidine (ZANAFLEX) 2 MG tablet Take 1 tablet (2 mg total) by mouth every 6 (six) hours as needed for muscle spasms. 11/25/22   Steele Sizer, MD  traZODone (DESYREL) 50 MG tablet TAKE 1/2 TO 1 TABLET BY MOUTH AT BEDTIME AS NEEDED FOR SLEEP 03/01/22   Sowles, Drue Stager, MD  Ubrogepant (UBRELVY) 100 MG TABS Take 1 tablet by mouth daily as needed. For migraine headaches 09/15/21   Steele Sizer, MD  venlafaxine XR (EFFEXOR-XR) 37.5 MG 24 hr capsule TAKE 1 CAPSULE BY MOUTH DAILY WITH BREAKFAST. 09/24/22   Steele Sizer, MD    Family History Family History  Problem Relation Age of Onset   Diabetes Mother    Hypertension Mother    Cancer Father 58       Mouth   COPD Father    Hypertension Brother    Diabetes Brother    Breast cancer Neg Hx     Social History Social History   Tobacco Use   Smoking status: Never   Smokeless tobacco: Never   Tobacco comments:    smoking cessation materials not required  Vaping Use   Vaping Use: Never used  Substance Use Topics   Alcohol use: No   Drug use: No     Allergies   Penicillins   Review of Systems Review of Systems: :negative unless otherwise  stated in HPI.      Physical Exam Triage Vital Signs ED Triage Vitals  Enc Vitals Group     BP 01/21/23 0846 (!) 140/95     Pulse Rate 01/21/23 0846 73     Resp 01/21/23 0846 14     Temp 01/21/23 0846 98.7 F (37.1 C)     Temp Source 01/21/23 0846 Oral     SpO2 01/21/23 0846 100 %     Weight 01/21/23  0843 158 lb (71.7 kg)     Height 01/21/23 0843 '5\' 2"'$  (1.575 m)     Head Circumference --      Peak Flow --      Pain Score 01/21/23 0843 8     Pain Loc --      Pain Edu? --      Excl. in [place]? --    No data found.  Updated Vital Signs BP (!) 140/95 (BP Location: Left Arm)   Pulse 73   Temp 98.7 F (37.1 C) (Oral)   Resp 14   Ht '5\' 2"'$  (1.575 m)   Wt 71.7 kg   LMP 03/16/2022   SpO2 100%   BMI 28.90 kg/m   Visual Acuity Right Eye Distance:   Left Eye Distance:   Bilateral Distance:    Right Eye Near:   Left Eye Near:    Bilateral Near:     Physical Exam GEN: well appearing female in no acute distress  CVS: well perfused  RESP: speaking in full sentences without pause, no respiratory distress  MSK:   Right wrist and shoulder: No evidence of bony deformity, asymmetry, or muscle atrophy. FROM  Right elbow:  No evidence of bony deformity, asymmetry, or muscle atrophy No tenderness over lateral epicondyles  TTP over olecranon extending distally  TTP over proximal forearm that is worse with squeezing  Full  passive (supination, pronation, flexion, extension).  Strength 5/5 grip, and  shoulder. Elbow strength not assess due to acute pain  Sensation intact. Peripheral pulses intact.   UC Treatments / Results  Labs (all labs ordered are listed, but only abnormal results are displayed) Labs Reviewed - No data to display  EKG   Radiology DG Forearm Right  Result Date: 01/21/2023 CLINICAL DATA:  Right elbow and forearm pain for 1 week. No known injury. EXAM: RIGHT FOREARM - 2 VIEW COMPARISON:  None Available. FINDINGS: There is no evidence of fracture or other  focal bone lesions. Soft tissues are unremarkable. IMPRESSION: Negative exam. Electronically Signed   By: Inge Rise M.D.   On: 01/21/2023 10:09   DG Elbow Complete Right  Result Date: 01/21/2023 CLINICAL DATA:  Right elbow and forearm pain for 1 week. No known injury. EXAM: RIGHT ELBOW - COMPLETE 3+ VIEW COMPARISON:  None Available. FINDINGS: There is no evidence of fracture, dislocation, or joint effusion. There is no evidence of arthropathy or other focal bone abnormality. Soft tissues are unremarkable. IMPRESSION: Negative exam. Electronically Signed   By: Inge Rise M.D.   On: 01/21/2023 10:08    Procedures Procedures (including critical care time)  Medications Ordered in UC Medications  acetaminophen (TYLENOL) tablet 975 mg (975 mg Oral Given 01/21/23 1008)    Initial Impression / Assessment and Plan / UC Course  I have reviewed the triage vital signs and the nursing notes.  Pertinent labs & imaging results that were available during my care of the patient were reviewed by me and considered in my medical decision making (see chart for details).      Pt is a 53 y.o.  female with history of MS who presents for 1 week of right forearm and elbow pain.  On exam she has pain at the proximal elbow and forearm with overlying  olecranon tenderness. Obtained right forearm and elbow plain films.  Personally reviewed by me were unremarkable for fracture or dislocation. Radiologist notes  no soft tissue swelling. Given 975 mg of Tylenol.    Patient to gradually  return to normal activities, as tolerated and continue ordinary activities within the limits permitted by pain. Prescribed steroid taper followed by Naproxen sodium  and muscle relaxer  for pain relief.  Tylenol PRN. Advised patient to avoid OTC NSAIDs while taking prescription NSAID. Recommended elbow compression sleeve and avoid activities that aggravate her pain.   Patient to follow up with orthopedic provider, if symptoms do  not improve with conservative treatment.  Return and ED precautions given. Understanding voiced. Discussed MDM, treatment plan and plan for follow-up with patient who agrees with plan.   Final Clinical Impressions(s) / UC Diagnoses   Final diagnoses:  Cubital bursitis of right elbow     Discharge Instructions      Your xray did not show any broken or dislocated bones.  See handout on elbow bursitis.   Take Tylenol 1500 mg with Naprosyn twice a day. Apply a lidocaine patch for additional relief.  Consider purchasing a elbow compression sleeve to help with pain. You can purchase one online or at [employer].   If pain does not improve in the next 2 weeks, follow up with an orthopedic provider like Dr Rosette Reveal or [facility] in [place].      ED Prescriptions     Medication Sig Dispense Auth. Provider   naproxen (NAPROSYN) 500 MG tablet Take 1 tablet (500 mg total) by mouth 2 (two) times daily with a meal. 30 tablet Rowyn Spilde, DO   methocarbamol (ROBAXIN) 500 MG tablet Take 1 tablet (500 mg total) by mouth 2 (two) times daily. 20 tablet Shabria Egley, DO   predniSONE (STERAPRED UNI-PAK 21 TAB) 10 MG (21) TBPK tablet Take by mouth daily. Take 6 tabs by mouth daily for 1, then 5 tabs for 1 day, then 4 tabs for 1 day, then 3 tabs for 1 day, then 2 tabs for 1 day, then 1 tab for 1 day. 21 tablet Lyndee Hensen, DO      PDMP not reviewed this encounter.   Lyndee Hensen, DO 01/21/23 1214

## 2023-01-23 ENCOUNTER — Ambulatory Visit
Admission: EM | Admit: 2023-01-23 | Discharge: 2023-01-23 | Disposition: A | Discharge status: Home / Self Care | Payer: Medicare Other | Attending: Family Medicine | Admitting: Family Medicine

## 2023-01-23 ENCOUNTER — Encounter: Payer: Self-pay | Admitting: Emergency Medicine

## 2023-01-23 ENCOUNTER — Ambulatory Visit: Payer: Medicare Other

## 2023-01-23 ENCOUNTER — Ambulatory Visit (INDEPENDENT_AMBULATORY_CARE_PROVIDER_SITE_OTHER): Payer: Medicare Other

## 2023-01-23 DIAGNOSIS — M79662 Pain in left lower leg: Secondary | ICD-10-CM

## 2023-01-23 DIAGNOSIS — W19XXXA Unspecified fall, initial encounter: Secondary | ICD-10-CM

## 2023-01-23 DIAGNOSIS — S93401A Sprain of unspecified ligament of right ankle, initial encounter: Secondary | ICD-10-CM

## 2023-01-23 DIAGNOSIS — M25571 Pain in right ankle and joints of right foot: Secondary | ICD-10-CM

## 2023-01-23 DIAGNOSIS — R600 Localized edema: Secondary | ICD-10-CM | POA: Diagnosis not present

## 2023-01-23 DIAGNOSIS — M25572 Pain in left ankle and joints of left foot: Secondary | ICD-10-CM | POA: Diagnosis not present

## 2023-01-23 DIAGNOSIS — M25531 Pain in right wrist: Secondary | ICD-10-CM

## 2023-01-23 DIAGNOSIS — S63501A Unspecified sprain of right wrist, initial encounter: Secondary | ICD-10-CM | POA: Diagnosis not present

## 2023-01-23 DIAGNOSIS — S82832A Other fracture of upper and lower end of left fibula, initial encounter for closed fracture: Secondary | ICD-10-CM

## 2023-01-23 MED ORDER — HYDROCODONE-ACETAMINOPHEN 5-325 MG PO TABS: 1.0000 | ORAL_TABLET | Freq: Four times a day (QID) | ORAL | 0 refills | Status: DC | PRN

## 2023-01-23 NOTE — ED Triage Notes (Signed)
Patient states that she fell down 8 steps inside her house this morning.  Patient c/o left and right ankle pain and pain in her right wrist.  Patient denies hitting her head or LOC.

## 2023-01-23 NOTE — ED Provider Notes (Signed)
[facility]    CSN: [id number] Arrival date & time: 01/23/23  1417      History   Chief Complaint Chief Complaint  Patient presents with   Fall   Ankle Pain   Wrist Pain    HPI  HPI Tammie Lopez is a 52 y.o. female.   Tammie Lopez presents after a fall down the steps today at home around 9 AM.  She is unsure what happens. She believes she tripped over her crocs. She tumbled and twisted her ankle. She tried to break her fall and injured her right wrist. The right ankle is swollen and the left outer ankle is pain. She has multiple sclerosis. Has some right thigh pain. No head or neck pain. No scrapes or bruising yet.   She sprained her ankle last year after a fall while wearing heels.     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 07/16/2020   Low TSH level 09/19/2019   Obesity (BMI 30-39.9) 12/06/2017   Insulin resistance 09/27/2017   Essential hypertension 09/21/2017   Right sided sciatica 09/21/2017   Major depression, recurrent ([place]) 05/24/2017   Migraine without aura, not intractable 12/09/2016   Nickel allergy 12/09/2016   GERD without esophagitis 12/09/2016   Other neutropenia ([place]) 12/09/2016   Dyslipidemia 12/09/2016   Diverticulosis 12/09/2016   Proctitis 12/09/2016   Allergic rhinitis, seasonal 12/09/2016   Chronic constipation 12/09/2016    Vitamin D deficiency 08/06/2013   MS (multiple sclerosis) ([place]) 04/17/2013    Past Surgical History:  Procedure Laterality Date   BREAST BIOPSY Left 01/06/2018    2 areas  neg   BUNIONECTOMY     CESAREAN SECTION N/A 2006   COLONOSCOPY WITH PROPOFOL N/A 02/09/2021   Procedure: COLONOSCOPY WITH PROPOFOL;  Surgeon: [name], MD;  Location: [place];  Service: Endoscopy;  Laterality: N/A;   GANGLION CYST EXCISION Bilateral    Hands   POLYPECTOMY N/A 02/09/2021   Procedure: POLYPECTOMY;  Surgeon: [name], MD;  Location: [place];  Service: Endoscopy;  Laterality: N/A;    OB History   No obstetric history on file.      Home Medications    Prior to Admission medications   Medication Sig Start Date End Date Taking? Authorizing Provider  fluconazole (DIFLUCAN) 150 MG tablet Take 1 tablet (150 mg total) by mouth every other day. 01/07/23   Steele Sizer, MD  HYDROcodone-acetaminophen (NORCO/VICODIN) 5-325 MG tablet Take 1-2 tablets by mouth every 6 (six) hours as needed. 01/23/23  Yes Bonnie Overdorf, DO  amLODipine (NORVASC) 2.5 MG tablet TAKE 1 TABLET BY MOUTH EVERY DAY 01/21/23   Teodora Medici, DO  Cholecalciferol (VITAMIN D) 50 MCG (2000 UT) CAPS Take 2,000 Units by mouth daily.    [provider]  dicyclomine (BENTYL) 20 MG tablet Take 1 tablet (20 mg total) by mouth 3 (three) times daily before meals. 09/06/22   [name], MD  esomeprazole (NEXIUM) 20 MG capsule Take 1 capsule (20 mg total) by mouth daily at 12 noon. 09/06/22   [name], MD  estradiol (ESTRACE) 0.1 MG/GM vaginal cream SMARTSIG:2 Gram(s) Vaginal Twice a Week 10/01/22   [provider]  hydrocortisone (ANUSOL-HC) 2.5 % rectal cream Place 1 Application rectally 2 (two) times daily. 09/06/22   [name], MD  hyoscyamine (LEVSIN) 0.125 MG tablet Take 1 tablet (0.125 mg total) by mouth every 4 (four) hours as needed. 02/15/22   Steele Sizer, MD  methocarbamol  (ROBAXIN) 500 MG tablet Take 1 tablet (500 mg total) by mouth 2 (two) times daily. 01/21/23   Lyndee Hensen, DO  naproxen (NAPROSYN) 500 MG tablet Take 1 tablet (500 mg total) by mouth 2 (two) times daily with a meal. 01/21/23   Benjamine Strout, DO  omeprazole (PRILOSEC) 40 MG capsule TAKE 1 CAPSULE BY MOUTH EVERY DAY 12/17/21   Ancil Boozer, Drue Stager, MD  Plecanatide (TRULANCE) 3 MG TABS Take by mouth.    [provider]  predniSONE (STERAPRED UNI-PAK 21 TAB) 10 MG (21) TBPK tablet Take by mouth daily. Take 6 tabs by mouth daily for 1, then 5 tabs for 1 day, then 4 tabs for 1 day, then 3 tabs for 1 day, then 2 tabs for 1 day, then 1 tab for 1 day. 01/21/23   Gabriel Conry, Ronnette Juniper, DO  pregabalin (LYRICA) 50 MG capsule Take 1-3 capsules (50-150 mg total) by mouth 3 (three) times daily. 12/17/21   Steele Sizer, MD  Semaglutide, 1 MG/DOSE, 4 MG/3ML SOPN Inject 1 mg as directed once a week. 05/28/22   Steele Sizer, MD  tiZANidine (ZANAFLEX) 2 MG tablet Take 1 tablet (2 mg total) by mouth every 6 (six) hours as needed for muscle spasms. 11/25/22   Steele Sizer, MD  traZODone (DESYREL) 50 MG tablet TAKE 1/2 TO 1 TABLET BY MOUTH AT BEDTIME AS NEEDED FOR SLEEP 03/01/22   Sowles, Drue Stager, MD  Ubrogepant (UBRELVY) 100 MG TABS Take 1 tablet by mouth daily as needed. For migraine headaches 09/15/21   Steele Sizer, MD  venlafaxine XR (EFFEXOR-XR) 37.5 MG 24 hr capsule TAKE 1 CAPSULE BY MOUTH DAILY WITH BREAKFAST. 09/24/22   Steele Sizer, MD    Family History Family History  Problem Relation Age of Onset   Diabetes Mother    Hypertension Mother    Cancer Father 45       Mouth   COPD Father    Hypertension Brother    Diabetes Brother    Breast cancer Neg Hx     Social History Social History   Tobacco Use   Smoking status: Never   Smokeless tobacco: Never   Tobacco comments:    smoking cessation materials not required  Vaping Use   Vaping Use: Never used  Substance Use Topics   Alcohol  use: No   Drug use: No     Allergies   Penicillins   Review of Systems Review of Systems: :negative unless otherwise stated in HPI.      Physical Exam Triage Vital Signs ED Triage Vitals  Enc Vitals Group     BP 01/23/23 1509 (!) 156/106     Pulse Rate 01/23/23 1509 100     Resp 01/23/23 1509 14     Temp 01/23/23 1509 98.9 F (37.2 C)     Temp  Source 01/23/23 1509 Oral     SpO2 01/23/23 1509 96 %     Weight 01/23/23 1507 158 lb 1.1 oz (71.7 kg)     Height 01/23/23 1507 '5\' 2"'$  (1.575 m)     Head Circumference --      Peak Flow --      Pain Score 01/23/23 1507 10     Pain Loc --      Pain Edu? --      Excl. in [place]? --    No data found.  Updated Vital Signs BP (!) 156/106 (BP Location: Left Arm)   Pulse 100   Temp 98.9 F (37.2 C) (Oral)   Resp 14   Ht '5\' 2"'$  (1.575 m)   Wt 71.7 kg   LMP 03/16/2022   SpO2 96%   BMI 28.91 kg/m   Visual Acuity Right Eye Distance:   Left Eye Distance:   Bilateral Distance:    Right Eye Near:   Left Eye Near:    Bilateral Near:     Physical Exam GEN: well appearing female in no acute distress  CVS: well perfused, radial and DP pulses strong   RESP: speaking in full sentences without pause, no respiratory distress  MSK:  Left ankle: Inspection: + mild edema, No erythema, ecchymosis or bony deformity, no bone pes planus or cavus deformity, transverse and medial arches intact Palpation: Tenderness at lateral malleolus  ROM: decreased ROM due to pain  Strength: not assessed due to acute pain  No pain at the base of the fifth metatarsal Left proximal to mid fibula tenderness to palpation Able to ambulate with pain  Special Tests: anterior and posterior drawer negative -Neurovascularly intact, no instability noted  Right ankle: Inspection: No erythema, edema, ecchymosis or bony deformity, no bone pes planus or cavus deformity, transverse and medial arches intact ROM: Decreased due to acute pain  Strength: not assessed  No  ligamentous laxity No pain at the base of the fifth metatarsal Special Tests: anterior and posterior drawer negative No pain at the base of the 5th metatarsal  -Neurovascularly intact, no instability noted  Wrist, Right: Inspection yielded no erythema, ecchymosis, bony deformity, or swelling. Limited active ROM due to active pain, passive FROM Palpation is normal over metacarpals, + scaphoid tenderness; tendons without tenderness/swelling. Grip strength 5/5.     UC Treatments / Results  Labs (all labs ordered are listed, but only abnormal results are displayed) Labs Reviewed - No data to display  EKG   Radiology DG Tibia/Fibula Left  Result Date: 01/23/2023 CLINICAL DATA:  Fall, pain. Fall down 8 stairs in house. EXAM: LEFT TIBIA AND FIBULA - 2 VIEW COMPARISON:  Concurrent ankle exam. FINDINGS: Nondisplaced distal fibular fracture distal to the fibular tip. Proximal fibula is intact. No acute tibial fracture. Ankle alignment assessed on concurrent ankle exam. Knee alignment is maintained. Mild soft tissue edema edema distally. IMPRESSION: Nondisplaced distal fibular fracture distal to the fibular tip. No additional fracture of the lower leg. Electronically Signed   By: Keith Rake M.D.   On: 01/23/2023 15:55   DG Ankle Complete Right  Result Date: 01/23/2023 CLINICAL DATA:  Right ankle pain due to fall this morning. Fall down 8 stairs in house. EXAM: RIGHT ANKLE - COMPLETE 3+ VIEW COMPARISON:  Radiograph 04/06/2022 FINDINGS: There is no evidence of fracture, dislocation, or joint effusion. Ankle mortise is preserved. Diminutive plantar calcaneal spur. There is no evidence of arthropathy or other focal bone abnormality. Soft tissues are unremarkable.  IMPRESSION: No fracture or subluxation of the right ankle. Electronically Signed   By: Keith Rake M.D.   On: 01/23/2023 15:54   DG Ankle Complete Left  Result Date: 01/23/2023 CLINICAL DATA:  Left ankle pain after fall this  morning. Fall down 8 stairs in house. EXAM: LEFT ANKLE COMPLETE - 3+ VIEW COMPARISON:  None Available. FINDINGS: Nondisplaced transverse fracture of the distal fibula distal to the ankle mortise. There is no mortise widening. No additional fracture. Minimal ankle joint effusion. Small plantar calcaneal spur. IMPRESSION: Nondisplaced distal fibular fracture. Electronically Signed   By: Keith Rake M.D.   On: 01/23/2023 15:53   DG Wrist Complete Right  Result Date: 01/23/2023 CLINICAL DATA:  Right wrist pain after fall this morning. Fall down 8 stairs in house. EXAM: RIGHT WRIST - COMPLETE 3+ VIEW COMPARISON:  Right forearm radiograph 2 days ago 01/21/2023 FINDINGS: There is no evidence of fracture or dislocation. Joint spaces are preserved. There is no evidence of arthropathy or other focal bone abnormality. Soft tissues are unremarkable. IMPRESSION: No fracture or dislocation of the right wrist. Electronically Signed   By: Keith Rake M.D.   On: 01/23/2023 15:52    Procedures Procedures (including critical care time)  Medications Ordered in UC Medications - No data to display  Initial Impression / Assessment and Plan / UC Course  I have reviewed the triage vital signs and the nursing notes.  Pertinent labs & imaging results that were available during my care of the patient were reviewed by me and considered in my medical decision making (see chart for details).      Pt is a 52 y.o.  female with acute bilateral ankle pain and right wrist pain after a fall this morning.  On exam, she additionally has left mid fibular pain that is reported to radiate distally.  Obtained plain films of the areas of concern that were personally reviewed by me and was noted to have a left lateral malleolus fracture.  Radiologist notes acute nondisplaced distal fibula fracture.  Patient updated and showed her x-rays.    Given right wrist brace for her wrist sprain.  Rule out scaphoid fracture or injury  effectively with acute x-ray.  Discussed that she may need further imaging of her wrist to rule out these.  Offered right ankle brace but states that she has one at home.  She was placed in a left cam boot and given crutches prior to discharge.  She was advised to follow-up with her orthopedic surgeon.  She follows with someone at the [facility].  Work note provided.  Norco prescribed for acute fracture pain.  She can continue over-the-counter medications for mild pain.  Patient to gradually return to normal activities, as tolerated and continue ordinary activities within the limits permitted by pain.    Patient to follow up with her orthopedic provider for further evaluation this week. Return and ED precautions given. Understanding voiced. Discussed MDM, treatment plan and plan for follow-up with patient who agrees with plan.   Final Clinical Impressions(s) / UC Diagnoses   Final diagnoses:  Closed avulsion fracture of distal end of left fibula, initial encounter  Sprain of right ankle, unspecified ligament, initial encounter  Right wrist sprain, initial encounter  Fall, initial encounter     Discharge Instructions      Follow up with your orthopedic provider at [facility] in the next week. Wear the CAM boot and use the crutches with movement for your lower fibula fracture.  You did not have fractures or dislocated bones in your right ankle or wrist.        ED Prescriptions     Medication Sig Dispense Auth. Provider   HYDROcodone-acetaminophen (NORCO/VICODIN) 5-325 MG tablet Take 1-2 tablets by mouth every 6 (six) hours as needed. 15 tablet Jaiona Simien, DO      I have reviewed the PDMP during this encounter.   Lyndee Hensen, DO 01/23/23 1933

## 2023-01-23 NOTE — Discharge Instructions (Addendum)
Follow up with your orthopedic provider at [facility] in the next week. Wear the CAM boot and use the crutches with movement for your lower fibula fracture.  You did not have fractures or dislocated bones in your right ankle or wrist.

## 2023-01-24 MED ORDER — HYDROCODONE-ACETAMINOPHEN 5-325 MG PO TABS: 1.0000 | ORAL_TABLET | Freq: Four times a day (QID) | ORAL | 0 refills | Status: DC | PRN

## 2023-01-24 NOTE — Telephone Encounter (Signed)
Patient seen by Dr. Susa Simmonds yesterday.  Pharmacy did not have the pain medication.  Resent this medication to a different pharmacy after nursing staff confirmed that they did not have it and patient did not pick up the prescription.

## 2023-01-26 DIAGNOSIS — S82832A Other fracture of upper and lower end of left fibula, initial encounter for closed fracture: Secondary | ICD-10-CM | POA: Diagnosis not present

## 2023-01-26 DIAGNOSIS — S63501A Unspecified sprain of right wrist, initial encounter: Secondary | ICD-10-CM | POA: Diagnosis not present

## 2023-01-26 DIAGNOSIS — S93401A Sprain of unspecified ligament of right ankle, initial encounter: Secondary | ICD-10-CM | POA: Diagnosis not present

## 2023-01-31 ENCOUNTER — Encounter: Payer: Self-pay | Admitting: Family Medicine

## 2023-02-14 DIAGNOSIS — Z0100 Encounter for examination of eyes and vision without abnormal findings: Secondary | ICD-10-CM | POA: Diagnosis not present

## 2023-02-15 ENCOUNTER — Ambulatory Visit: Payer: Medicare Other | Admitting: Physician Assistant

## 2023-02-15 ENCOUNTER — Telehealth (INDEPENDENT_AMBULATORY_CARE_PROVIDER_SITE_OTHER): Payer: Medicare Other | Admitting: Physician Assistant

## 2023-02-15 ENCOUNTER — Ambulatory Visit: Payer: Medicare Other

## 2023-02-15 DIAGNOSIS — L309 Dermatitis, unspecified: Secondary | ICD-10-CM

## 2023-02-15 MED ORDER — PREDNISONE 20 MG PO TABS: ORAL_TABLET | ORAL | 0 refills | Status: DC

## 2023-02-15 MED ORDER — TRIAMCINOLONE ACETONIDE 0.1 % EX CREA: 1.0000 | TOPICAL_CREAM | Freq: Two times a day (BID) | CUTANEOUS | 0 refills | Status: DC

## 2023-02-15 NOTE — Progress Notes (Signed)
Virtual Visit via Video Note  I connected with Tammie Lopez on 02/15/23 at 10:00 AM EDT by a video enabled telemedicine application and verified that I am speaking with the correct person using two identifiers. Today's Provider: Talitha Givens, MHS, PA-C Introduced myself to the patient as a PA-C and provided education on APPs in clinical practice.   Location: Patient: at home  Provider: [place], [place]   I discussed the limitations of evaluation and management by telemedicine and the availability of in person appointments. The patient expressed understanding and agreed to proceed.   Chief Complaint  Patient presents with   Rash    Onset since end of last year Around stomach and elbows, stomach area worst starting to smell, itchy, it can get red but mainly brown color.    History of Present Illness:  RASH   Reports she has had a few different rashes   Duration:  weeks  Location: trunk and arms  Itching: yes Burning: yes Redness: no Oozing: yes on Elbows  Scaling: yes Blisters: no Painful: yes Fevers: no Change in detergents/soaps/personal care products: no Recent illness: no Recent travel:no History of same: yes Context: worse and fluctuating Alleviating factors: hydrocortisone cream and lotion/moisturizer- mild relief with itching  Treatments attempted:hydrocortisone cream and lotion/moisturizer, tried Diflucan previously but she thinks this may have made it worse Shortness of breath: no  Throat/tongue swelling: no Myalgias/arthralgias: no  Cortizone 10 and CeraVe healing ointment   She reports scratching areas and making sores sometimes      Observations/Objective:    Due to the nature of the virtual visit, physical exam and observations are limited. Able to obtain the following observations:   Alert, oriented, x3 Appears comfortable, in no acute distress.  No scleral injection, no appreciated hoarseness, tachypnea,  wheeze or strider. Able to maintain conversation without visible strain.  No cough appreciated during visit.   Skin  One on her elbow is dry and itchy but can hurt sometimes - appears maculopapular, some vesicles may be present but difficult to discern due to mode of visit  Area around stomach appears scaly and macular, silvery  Area of lower abdomen does not appear to have visible rash but she reports pain and tenderness there   Lower abdomen appears to have deeper pits and nodules - she reports when she sweats she notices a smell and there can be drainage and puss from the lesions She states the lower abdomen seems to have same rash as the elbows and denies deeper nodules there    Assessment and Plan:   Problem List Items Addressed This Visit       Musculoskeletal and Integument   Eczema    Acute on chronic, ongoing concern She has a silvery, maculopapular rash on her abdomen near the navel that she states is not responding well to occlusive ointments or moisturizing treatments Will send in script for Kenalog cream - recommend using with Aquaphor or Vaseline to assist with dryness Reviewed not to use longer than 2 weeks to prevent skin thinning Will refer to Dermatology for assistance with therapy if she is still having concerns Follow up as needed      Relevant Orders   Ambulatory referral to Dermatology   Other Visit Diagnoses     Dermatitis    -  Primary Acute, ongoing  She reports vesiculopapular rash on elbows and lower abdomen that did not improve with home measures or Diflucan course Unsure if this  is irritant reaction or allergy at this time- visualization of rash is limited due to mode of visit Will try Prednisone to assist with itching and Kenalog cream to assist with itching and discomfort Dermatology referral placed Recommend follow up in office for persistent or progressing symptoms    Relevant Medications   predniSONE (DELTASONE) 20 MG tablet    triamcinolone cream (KENALOG) 0.1 %   Other Relevant Orders   Ambulatory referral to Dermatology      Follow Up Instructions:    I discussed the assessment and treatment plan with the patient. The patient was provided an opportunity to ask questions and all were answered. The patient agreed with the plan and demonstrated an understanding of the instructions.   The patient was advised to call back or seek an in-person evaluation if the symptoms worsen or if the condition fails to improve as anticipated.  I provided 19 minutes of non-face-to-face time during this encounter.  No follow-ups on file.   I, Tomasz Steeves E Chidubem Chaires, PA-C, have reviewed all documentation for this visit. The documentation on 02/15/23 for the exam, diagnosis, procedures, and orders are all accurate and complete.   Talitha Givens, MHS, PA-C [facility]

## 2023-02-15 NOTE — Assessment & Plan Note (Signed)
Acute on chronic, ongoing concern She has a silvery, maculopapular rash on her abdomen near the navel that she states is not responding well to occlusive ointments or moisturizing treatments Will send in script for Kenalog cream - recommend using with Aquaphor or Vaseline to assist with dryness Reviewed not to use longer than 2 weeks to prevent skin thinning Will refer to Dermatology for assistance with therapy if she is still having concerns Follow up as needed

## 2023-02-16 DIAGNOSIS — S93411D Sprain of calcaneofibular ligament of right ankle, subsequent encounter: Secondary | ICD-10-CM | POA: Diagnosis not present

## 2023-02-16 DIAGNOSIS — S82832D Other fracture of upper and lower end of left fibula, subsequent encounter for closed fracture with routine healing: Secondary | ICD-10-CM | POA: Diagnosis not present

## 2023-02-17 ENCOUNTER — Telehealth: Payer: Self-pay | Admitting: Family Medicine

## 2023-02-17 ENCOUNTER — Other Ambulatory Visit: Payer: Self-pay | Admitting: Orthopedic Surgery

## 2023-02-17 DIAGNOSIS — S93411D Sprain of calcaneofibular ligament of right ankle, subsequent encounter: Secondary | ICD-10-CM

## 2023-02-17 NOTE — Telephone Encounter (Signed)
Called and spoke with Shirlean Mylar to let her know when the guy picks up usually for MR

## 2023-02-17 NOTE — Telephone Encounter (Signed)
Shirlean Mylar calling from [facility] is calling to follow up on release for medical records sent 12/21/22. Shirlean Mylar will refax today. If not received Please call [facility]- [phone]

## 2023-03-01 ENCOUNTER — Encounter: Payer: Self-pay | Admitting: Orthopedic Surgery

## 2023-03-07 ENCOUNTER — Ambulatory Visit
Admission: RE | Admit: 2023-03-07 | Discharge: 2023-03-07 | Disposition: A | Discharge status: Home / Self Care | Payer: Medicare Other | Source: Ambulatory Visit | Attending: Orthopedic Surgery | Admitting: Orthopedic Surgery

## 2023-03-07 ENCOUNTER — Other Ambulatory Visit: Payer: Self-pay | Admitting: Orthopedic Surgery

## 2023-03-07 DIAGNOSIS — R600 Localized edema: Secondary | ICD-10-CM | POA: Diagnosis not present

## 2023-03-07 DIAGNOSIS — S82832A Other fracture of upper and lower end of left fibula, initial encounter for closed fracture: Secondary | ICD-10-CM

## 2023-03-07 DIAGNOSIS — S93411D Sprain of calcaneofibular ligament of right ankle, subsequent encounter: Secondary | ICD-10-CM

## 2023-03-09 ENCOUNTER — Other Ambulatory Visit: Payer: Self-pay | Admitting: Family Medicine

## 2023-03-09 ENCOUNTER — Encounter: Payer: Self-pay | Admitting: Family Medicine

## 2023-03-09 ENCOUNTER — Encounter: Payer: Self-pay | Admitting: Physician Assistant

## 2023-03-09 ENCOUNTER — Other Ambulatory Visit: Payer: Medicare Other

## 2023-03-09 DIAGNOSIS — L309 Dermatitis, unspecified: Secondary | ICD-10-CM

## 2023-03-09 MED ORDER — TRIAMCINOLONE ACETONIDE 0.1 % EX CREA: 1.0000 | TOPICAL_CREAM | Freq: Two times a day (BID) | CUTANEOUS | 0 refills | Status: DC

## 2023-03-14 DIAGNOSIS — S93492D Sprain of other ligament of left ankle, subsequent encounter: Secondary | ICD-10-CM | POA: Diagnosis not present

## 2023-03-14 DIAGNOSIS — S82821D Torus fracture of lower end of right fibula, subsequent encounter for fracture with routine healing: Secondary | ICD-10-CM | POA: Diagnosis not present

## 2023-03-15 ENCOUNTER — Other Ambulatory Visit: Payer: Self-pay | Admitting: Internal Medicine

## 2023-03-15 DIAGNOSIS — I10 Essential (primary) hypertension: Secondary | ICD-10-CM

## 2023-03-16 NOTE — Telephone Encounter (Signed)
Requested Prescriptions  Pending Prescriptions Disp Refills   amLODipine (NORVASC) 2.5 MG tablet [Pharmacy Med Name: AMLODIPINE BESYLATE 2.5 MG TAB] 90 tablet 0    Sig: TAKE 1 TABLET BY MOUTH EVERY DAY     Cardiovascular: Calcium Channel Blockers 2 Failed - 03/15/2023 10:31 AM      Failed - Last BP in normal range    BP Readings from Last 1 Encounters:  01/23/23 (!) 156/106         Passed - Last Heart Rate in normal range    Pulse Readings from Last 1 Encounters:  01/23/23 100         Passed - Valid encounter within last 6 months    Recent Outpatient Visits           4 weeks ago Dermatitis   [facility] Mecum, Oswaldo Conroy, PA-C   3 months ago Muscular abdominal pain in left upper quadrant   [facility] Cory, MD   6 months ago Well adult exam   [facility] Cory, MD   6 months ago MS (multiple sclerosis) [facility])   [facility] Alba Cory, MD   9 months ago MS (multiple sclerosis) [facility])   [facility] Al[place] Cory, MD

## 2023-03-28 DIAGNOSIS — Z79899 Other long term (current) drug therapy: Secondary | ICD-10-CM | POA: Diagnosis not present

## 2023-03-28 DIAGNOSIS — G35 Multiple sclerosis: Secondary | ICD-10-CM | POA: Diagnosis not present

## 2023-04-04 DIAGNOSIS — S93492D Sprain of other ligament of left ankle, subsequent encounter: Secondary | ICD-10-CM | POA: Diagnosis not present

## 2023-04-04 DIAGNOSIS — S82821D Torus fracture of lower end of right fibula, subsequent encounter for fracture with routine healing: Secondary | ICD-10-CM | POA: Diagnosis not present

## 2023-04-12 ENCOUNTER — Encounter: Payer: Self-pay | Admitting: Family Medicine

## 2023-04-12 ENCOUNTER — Ambulatory Visit (INDEPENDENT_AMBULATORY_CARE_PROVIDER_SITE_OTHER): Payer: Medicare Other | Admitting: Family Medicine

## 2023-04-12 VITALS — BP 132/80 | HR 92 | Resp 16 | Ht 62.0 in | Wt 164.0 lb

## 2023-04-12 DIAGNOSIS — F330 Major depressive disorder, recurrent, mild: Secondary | ICD-10-CM | POA: Diagnosis not present

## 2023-04-12 DIAGNOSIS — D708 Other neutropenia: Secondary | ICD-10-CM | POA: Diagnosis not present

## 2023-04-12 DIAGNOSIS — E785 Hyperlipidemia, unspecified: Secondary | ICD-10-CM | POA: Diagnosis not present

## 2023-04-12 DIAGNOSIS — E559 Vitamin D deficiency, unspecified: Secondary | ICD-10-CM

## 2023-04-12 DIAGNOSIS — G43009 Migraine without aura, not intractable, without status migrainosus: Secondary | ICD-10-CM

## 2023-04-12 DIAGNOSIS — R7303 Prediabetes: Secondary | ICD-10-CM

## 2023-04-12 DIAGNOSIS — I10 Essential (primary) hypertension: Secondary | ICD-10-CM | POA: Diagnosis not present

## 2023-04-12 DIAGNOSIS — G35 Multiple sclerosis: Secondary | ICD-10-CM | POA: Diagnosis not present

## 2023-04-12 DIAGNOSIS — R1012 Left upper quadrant pain: Secondary | ICD-10-CM

## 2023-04-12 DIAGNOSIS — R7989 Other specified abnormal findings of blood chemistry: Secondary | ICD-10-CM | POA: Diagnosis not present

## 2023-04-12 DIAGNOSIS — K5904 Chronic idiopathic constipation: Secondary | ICD-10-CM

## 2023-04-12 DIAGNOSIS — M7918 Myalgia, other site: Secondary | ICD-10-CM

## 2023-04-12 DIAGNOSIS — G8929 Other chronic pain: Secondary | ICD-10-CM

## 2023-04-12 DIAGNOSIS — M5442 Lumbago with sciatica, left side: Secondary | ICD-10-CM

## 2023-04-12 NOTE — Patient Instructions (Signed)
Tdap at local pharmacy.

## 2023-04-12 NOTE — Progress Notes (Signed)
Name: Tammie Lopez   MRN: [id number]    DOB: 1971-03-30   Date:04/12/2023       Progress Note  Subjective  Chief Complaint  Back/ Side Pain  HPI  Left upper abdominal pain: she states worse with movement, like stretching, better when applying pressure to the area.  She has MS and has two lesions on left posterolateral T2 and T 6.  Discussed referral to pain doctor, ortho or psychiatrist to consider nerve block. She has been evaluated by GI - Dr. Servando Snare  and explained not GI related. Reviewed CT with patient She denies problems breathing, soreness radiates to her back She has seen Ortho and had PT , discussed referral to pain management. She states she will discuss it again with ortho   Chronic low back pain with radiculitis: she states pain on left lower back is getting worse and radiating down to foot. She has rx for Lyrica at home but has not been taking it . Dr. Rosita Kea ordered MRI lumbar spine  MPRESSION:done 12/21/2022  1. Grade 1 anterolisthesis at L3-L4 with disc and moderate posterior element degeneration. Questionable tiny (2 mm) synovial cyst on the left directed anteriorly toward the left lateral recess. No significant stenosis, but query left L4 radiculitis. 2. Mild disc degeneration at L4-L5 with a small right subarticular annular fissure of the disc. No associated stenosis. 3. Other visible spinal levels are normal for age.   HTN: BP is at goal. She has been compliant with medications now. She denies chest pain, dizziness or palpitation  BP is at goal    MS: she has been off Tacfidera due to leucopenia, she was going to [facility] neurology, and was taking  Plegridy. She is now seeing Dr. Denice Bors  at [facility] and was off all medications for about 6 months resumed Tecfidera Fall of 2023 - it was stopped in the past due to neutropenia - we will recheck level today  . No side effects of medication    Obesity: she has insulin resistance, took Trulicity,  and Metformin in the past ,  started [facility]  October 2022 and went up to 1 mg March 2023 weight was trending down but stopped taking it on her own Summer 2023 due to constipation  and weight is up from 155 lbs to 164 lbs today.She is frustrated    Pre-diabetes: last A1C improved,  last level was 5.6 % .  She has  HTN and increase in abdominal girth, she stopped Ozempic due to constipation, and now it is no longer covered by insurance    Depression: She stopped taking Effexor, tired of taking medications, continues to be upset about her weight, LUQ pain - chronic - having MS, weight gain.   Hot flashes and night sweats: she stopped Effexor , tired of taking pills, she does not want to take anything at this time.   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 07/16/2020   Low TSH level 09/19/2019   Obesity (BMI 30-39.9) 12/06/2017   Insulin resistance 09/27/2017   Essential hypertension 09/21/2017   Right sided sciatica 09/21/2017   Major depression, recurrent (HCC) 05/24/2017   Migraine without aura, not intractable 12/09/2016   Nickel allergy 12/09/2016   GERD without esophagitis 12/09/2016   Other neutropenia (HCC) 12/09/2016   Dyslipidemia 12/09/2016   Diverticulosis 12/09/2016   Proctitis 12/09/2016   Allergic rhinitis, seasonal 12/09/2016   Chronic constipation 12/09/2016   Vitamin D deficiency 08/06/2013   MS (multiple sclerosis) (HCC)  04/17/2013    Past Surgical History:  Procedure Laterality Date   BREAST BIOPSY Left 01/06/2018    2 areas  neg   BUNIONECTOMY     CESAREAN SECTION N/A 2006   COLONOSCOPY WITH PROPOFOL N/A 02/09/2021   Procedure: COLONOSCOPY WITH PROPOFOL;  Surgeon: Midge Minium, MD;  Location: [facility];  Service: Endoscopy;  Laterality: N/A;   GANGLION CYST EXCISION Bilateral    Hands   POLYPECTOMY N/A 02/09/2021   Procedure: POLYPECTOMY;  Surgeon: Midge Minium, MD;  Location: [facility];  Service: Endoscopy;  Laterality: N/A;    Family History  Problem Relation Age of Onset   Diabetes Mother    Hypertension Mother    Cancer Father 26       Mouth   COPD Father    Hypertension Brother    Diabetes Brother    Breast cancer Neg Hx     Social History   Tobacco Use   Smoking status: Never   Smokeless tobacco: Never   Tobacco comments:    smoking cessation materials not required  Substance Use Topics   Alcohol use: No     Current Outpatient Medications:    amLODipine (NORVASC) 2.5 MG tablet, TAKE 1 TABLET BY MOUTH EVERY DAY, Disp: 90 tablet, Rfl: 0   Cholecalciferol (VITAMIN D) 50 MCG (2000 UT) CAPS, Take 2,000 Units by mouth daily., Disp: , Rfl:    dicyclomine (BENTYL) 20 MG tablet, Take 1 tablet (20 mg total) by mouth 3 (three) times daily before meals., Disp: 30 tablet, Rfl: 4   Dimethyl Fumarate (TECFIDERA) 240 MG CPDR, Take 1 capsule by mouth in the morning and at bedtime., Disp: , Rfl:    estradiol (ESTRACE) 0.1 MG/GM vaginal cream, SMARTSIG:2 Gram(s) Vaginal Twice a Week, Disp: , Rfl:    hydrocortisone (ANUSOL-HC) 2.5 % rectal cream, Place 1 Application rectally 2 (two) times daily., Disp: 30 g, Rfl: 1   naproxen (NAPROSYN) 500 MG tablet, Take 1 tablet (500 mg total) by mouth 2 (two) times daily with a meal., Disp: 30 tablet, Rfl: 0   omeprazole (PRILOSEC) 40 MG capsule, TAKE 1 CAPSULE BY MOUTH EVERY DAY, Disp: 90 capsule, Rfl: 1   Plecanatide (TRULANCE) 3 MG TABS,  Take by mouth., Disp: , Rfl:    traZODone (DESYREL) 50 MG tablet, TAKE 1/2 TO 1 TABLET BY MOUTH AT BEDTIME AS NEEDED FOR SLEEP, Disp: 90 tablet, Rfl: 0   triamcinolone cream (KENALOG) 0.1 %, Apply 1 Application topically 2 (two) times daily., Disp: 30 g, Rfl: 0   Ubrogepant (UBRELVY) 100 MG TABS, Take 1 tablet by mouth daily as needed. For migraine headaches, Disp: 16 tablet, Rfl: 1  Allergies  Allergen Reactions   Penicillins Hives    I personally reviewed active problem list, medication list, allergies, family history, social history, health maintenance with the patient/caregiver today.   ROS  Ten systems reviewed and is negative except as  mentioned in HPI   Objective  Vitals:   04/12/23 1405  BP: 132/80  Pulse: 92  Resp: 16  SpO2: 97%  Weight: 164 lb (74.4 kg)  Height: 5\' 2"  (1.575 m)    Body mass index is 30 kg/m.  Physical Exam  Constitutional: Patient appears well-developed and well-nourished. Obese  No distress.  HEENT: head atraumatic, normocephalic, pupils equal and reactive to light, neck supple Cardiovascular: Normal rate, regular rhythm and normal heart sounds.  No murmur heard. No BLE edema. Pulmonary/Chest: Effort normal and breath sounds normal. No respiratory distress. Abdominal: Soft.  There is LUQ tenderness, feels like trapped air/possible a bowel loop Psychiatric: Patient has a normal mood and affect. behavior is normal. Judgment and thought content normal.   PHQ2/9:    04/12/2023    2:01 PM 02/15/2023    8:49 AM 11/25/2022    8:57 AM 09/02/2022   10:02 AM 08/25/2022   10:59 AM  Depression screen PHQ 2/9  Decreased Interest 2 1 0 0 0  Down, Depressed, Hopeless 2 1 0 0 0  PHQ - 2 Score 4 2 0 0 0  Altered sleeping 1 1 0 3 0  Tired, decreased energy 0 1 0 0 0  Change in appetite 0 0 0 0 0  Feeling bad or failure about yourself  1 0 0 0 0  Trouble concentrating 0 1 0 0 0  Moving slowly or fidgety/restless 0 0 0 0 0  Suicidal thoughts 0 0 0 0 0   PHQ-9 Score 6 5 0 3 0  Difficult doing work/chores  Somewhat difficult       phq 9 is positive   Fall Risk:    04/12/2023    2:01 PM 02/15/2023    8:48 AM 11/25/2022    8:57 AM 09/02/2022   10:02 AM 08/25/2022   10:59 AM  Fall Risk   Falls in the past year? 1 1 0 1 1  Number falls in past yr: 0 1  0 0  Injury with Fall? 1 1  1 1   Risk for fall due to : No Fall Risks Impaired balance/gait No Fall Risks No Fall Risks History of fall(s)  Follow up Falls prevention discussed Falls prevention discussed;Education provided;Falls evaluation completed Falls prevention discussed;Education provided;Falls evaluation completed Falls prevention discussed Falls prevention discussed;Education provided;Falls evaluation completed     Functional Status Survey: Is the patient deaf or have difficulty hearing?: No Does the patient have difficulty seeing, even when wearing glasses/contacts?: No Does the patient have difficulty concentrating, remembering, or making decisions?: No Does the patient have difficulty walking or climbing stairs?: Yes Does the patient have difficulty dressing or bathing?: No Does the patient have difficulty doing errands alone such as visiting a doctor's office or shopping?: No    Assessment & Plan  1. MS (multiple sclerosis) (HCC)  Under the care of neurologist   2. Mild episode of recurrent major depressive disorder (HCC)  She refuses medication   3. Other neutropenia (HCC)  - CBC with Differential/Platelet  4. Vitamin D deficiency disease  - VITAMIN D 25 Hydroxy (Vit-D Deficiency, Fractures)  5. Migraine without aura and without status migrainosus, not intractable  Stable  6. Hypertension, benign  - COMPLETE METABOLIC PANEL WITH GFR  7. Pre-diabetes  - Hemoglobin A1c  8. Muscular abdominal pain in left upper quadrant  She sees Ortho, discussed possible superficial nerve being pinched.   9. Chronic idiopathic constipation   10.  Dyslipidemia  - Lipid  panel  11. Chronic bilateral low back pain with left-sided sciatica  Under the care of ortho  12. Low TSH level  - TSH  13. Chronic left upper quadrant pain   Possible never entrapment

## 2023-04-13 ENCOUNTER — Other Ambulatory Visit: Payer: Self-pay

## 2023-04-13 ENCOUNTER — Encounter: Payer: Self-pay | Admitting: Family Medicine

## 2023-04-13 DIAGNOSIS — E559 Vitamin D deficiency, unspecified: Secondary | ICD-10-CM

## 2023-04-13 DIAGNOSIS — R7303 Prediabetes: Secondary | ICD-10-CM

## 2023-04-13 LAB — CBC WITH DIFFERENTIAL/PLATELET
Absolute Monocytes: 397 cells/uL (ref 200–950)
Basophils Absolute: 10 cells/uL (ref 0–200)
Basophils Relative: 0.3 %
Eosinophils Absolute: 138 cells/uL (ref 15–500)
Eosinophils Relative: 4.3 %
HCT: 39.7 % (ref 35.0–45.0)
Hemoglobin: 13.2 g/dL (ref 11.7–15.5)
Lymphs Abs: 1267 cells/uL (ref 850–3900)
MCH: 27.5 pg (ref 27.0–33.0)
MCHC: 33.2 g/dL (ref 32.0–36.0)
MCV: 82.7 fL (ref 80.0–100.0)
MPV: 11.2 fL (ref 7.5–12.5)
Monocytes Relative: 12.4 %
Neutro Abs: 1389 cells/uL — ABNORMAL LOW (ref 1500–7800)
Neutrophils Relative %: 43.4 %
Platelets: 247 10*3/uL (ref 140–400)
RBC: 4.8 10*6/uL (ref 3.80–5.10)
RDW: 12.7 % (ref 11.0–15.0)
Total Lymphocyte: 39.6 %
WBC: 3.2 10*3/uL — ABNORMAL LOW (ref 3.8–10.8)

## 2023-04-13 LAB — COMPLETE METABOLIC PANEL WITH GFR
AG Ratio: 1.6 (calc) (ref 1.0–2.5)
ALT: 10 U/L (ref 6–29)
AST: 13 U/L (ref 10–35)
Albumin: 4.5 g/dL (ref 3.6–5.1)
Alkaline phosphatase (APISO): 48 U/L (ref 37–153)
BUN: 15 mg/dL (ref 7–25)
CO2: 29 mmol/L (ref 20–32)
Calcium: 9.7 mg/dL (ref 8.6–10.4)
Chloride: 104 mmol/L (ref 98–110)
Creat: 0.75 mg/dL (ref 0.50–1.03)
Globulin: 2.8 g/dL (calc) (ref 1.9–3.7)
Glucose, Bld: 109 mg/dL — ABNORMAL HIGH (ref 65–99)
Potassium: 3.9 mmol/L (ref 3.5–5.3)
Sodium: 142 mmol/L (ref 135–146)
Total Bilirubin: 0.3 mg/dL (ref 0.2–1.2)
Total Protein: 7.3 g/dL (ref 6.1–8.1)
eGFR: 97 mL/min/{1.73_m2} (ref >=60)

## 2023-04-13 LAB — TSH: TSH: 0.49 mIU/L

## 2023-04-13 LAB — LIPID PANEL
Cholesterol: 201 mg/dL — ABNORMAL HIGH (ref <200)
HDL: 61 mg/dL (ref >=50)
LDL Cholesterol (Calc): 118 mg/dL (calc) — ABNORMAL HIGH
Non-HDL Cholesterol (Calc): 140 mg/dL (calc) — ABNORMAL HIGH (ref <130)
Total CHOL/HDL Ratio: 3.3 (calc) (ref <5.0)
Triglycerides: 111 mg/dL (ref <150)

## 2023-04-13 LAB — VITAMIN D 25 HYDROXY (VIT D DEFICIENCY, FRACTURES): Vit D, 25-Hydroxy: 37 ng/mL (ref 30–100)

## 2023-04-13 LAB — HEMOGLOBIN A1C
Hgb A1c MFr Bld: 5.7 % of total Hgb — ABNORMAL HIGH (ref <5.7)
Mean Plasma Glucose: 117 mg/dL
eAG (mmol/L): 6.5 mmol/L

## 2023-04-19 ENCOUNTER — Other Ambulatory Visit: Payer: Self-pay | Admitting: Family Medicine

## 2023-04-19 DIAGNOSIS — R7303 Prediabetes: Secondary | ICD-10-CM

## 2023-04-19 NOTE — Telephone Encounter (Unsigned)
Copied from CRM [id number]. Topic: General - Other >> Apr 19, 2023  3:10 PM Dondra Prader E wrote: Reason for CRM: Jimmey Ralph from Ultrasound says that ultrasound does not work on bowels... still willing to scan patient however they cannot see through bowels.   Best contact: [phone]

## 2023-04-20 ENCOUNTER — Ambulatory Visit
Admission: RE | Admit: 2023-04-20 | Discharge: 2023-04-20 | Disposition: A | Discharge status: Home / Self Care | Payer: Medicare Other | Source: Ambulatory Visit | Attending: Family Medicine | Admitting: Family Medicine

## 2023-04-20 ENCOUNTER — Encounter: Payer: Self-pay | Admitting: Family Medicine

## 2023-04-20 DIAGNOSIS — G8929 Other chronic pain: Secondary | ICD-10-CM | POA: Diagnosis present

## 2023-04-20 DIAGNOSIS — R1012 Left upper quadrant pain: Secondary | ICD-10-CM | POA: Diagnosis not present

## 2023-05-02 ENCOUNTER — Encounter: Payer: Self-pay | Admitting: Family Medicine

## 2023-05-02 ENCOUNTER — Other Ambulatory Visit: Payer: Self-pay | Admitting: Family Medicine

## 2023-05-02 ENCOUNTER — Ambulatory Visit
Admission: RE | Admit: 2023-05-02 | Discharge: 2023-05-02 | Disposition: A | Discharge status: Home / Self Care | Payer: Medicare Other | Source: Ambulatory Visit | Attending: Family Medicine | Admitting: Family Medicine

## 2023-05-02 ENCOUNTER — Ambulatory Visit
Admission: RE | Admit: 2023-05-02 | Discharge: 2023-05-02 | Disposition: A | Discharge status: Home / Self Care | Payer: Medicare Other | Attending: Family Medicine | Admitting: Family Medicine

## 2023-05-02 DIAGNOSIS — R1012 Left upper quadrant pain: Secondary | ICD-10-CM | POA: Diagnosis not present

## 2023-05-02 DIAGNOSIS — R0789 Other chest pain: Secondary | ICD-10-CM | POA: Diagnosis not present

## 2023-05-02 DIAGNOSIS — S82821D Torus fracture of lower end of right fibula, subsequent encounter for fracture with routine healing: Secondary | ICD-10-CM | POA: Diagnosis not present

## 2023-05-02 DIAGNOSIS — G8929 Other chronic pain: Secondary | ICD-10-CM | POA: Diagnosis not present

## 2023-05-02 DIAGNOSIS — S93492D Sprain of other ligament of left ankle, subsequent encounter: Secondary | ICD-10-CM | POA: Diagnosis not present

## 2023-05-10 ENCOUNTER — Other Ambulatory Visit: Payer: Self-pay | Admitting: Orthopedic Surgery

## 2023-05-10 DIAGNOSIS — S93492D Sprain of other ligament of left ankle, subsequent encounter: Secondary | ICD-10-CM

## 2023-05-11 ENCOUNTER — Other Ambulatory Visit: Payer: Self-pay | Admitting: Family Medicine

## 2023-05-11 DIAGNOSIS — R0789 Other chest pain: Secondary | ICD-10-CM

## 2023-05-11 DIAGNOSIS — D708 Other neutropenia: Secondary | ICD-10-CM

## 2023-05-11 DIAGNOSIS — G35 Multiple sclerosis: Secondary | ICD-10-CM

## 2023-05-12 NOTE — Therapy (Deleted)
OUTPATIENT PHYSICAL THERAPY EVALUATION   Patient Name: Tammie Lopez MRN: [id number] DOB:[birth year], 52 y.o., female Today's Date: 05/12/2023  END OF SESSION:   Past Medical History:  Diagnosis Date   Allergy    Constipation    Diverticula of colon    Family history of adverse reaction to anesthesia    mother's BP increased   Hypertension    Insomnia    Migraine    migraines   MS (multiple sclerosis) (HCC)    Muscle spasm    Past Surgical History:  Procedure Laterality Date   BREAST BIOPSY Left 01/06/2018    2 areas  neg   BUNIONECTOMY     CESAREAN SECTION N/A 2006   COLONOSCOPY WITH PROPOFOL N/A 02/09/2021   Procedure: COLONOSCOPY WITH PROPOFOL;  Surgeon: Midge Minium, MD;  Location: [facility];  Service: Endoscopy;  Laterality: N/A;   GANGLION CYST EXCISION Bilateral    Hands   POLYPECTOMY N/A 02/09/2021   Procedure: POLYPECTOMY;  Surgeon: Midge Minium, MD;  Location: [facility];  Service: Endoscopy;  Laterality: N/A;   Patient Active Problem List   Diagnosis Date Noted   Chronic bilateral low back pain with left-sided sciatica 11/25/2022   Muscular abdominal pain in left upper quadrant 11/25/2022   Intertrigo 11/25/2022   Personal history of colonic polyps    Polyp of descending colon    Eczema 01/20/2021   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 tuberculosis 07/16/2020   Low TSH level 09/19/2019   Obesity (BMI 30-39.9) 12/06/2017   Insulin resistance 09/27/2017   Essential hypertension 09/21/2017   Right sided sciatica 09/21/2017   Major depression, recurrent (HCC) 05/24/2017   Migraine without aura, not intractable 12/09/2016   Nickel allergy 12/09/2016   GERD without esophagitis 12/09/2016   Other neutropenia (HCC) 12/09/2016   Dyslipidemia 12/09/2016   Diverticulosis 12/09/2016   Proctitis 12/09/2016   Allergic rhinitis, seasonal 12/09/2016   Chronic constipation 12/09/2016   Vitamin D deficiency 08/06/2013    MS (multiple sclerosis) (HCC) 04/17/2013    PCP: Alba Cory, MD  REFERRING PROVIDER: Kennedy Bucker, MD  REFERRING DIAG: sprain of anterior talofibular ligament of left knee, closed torus fracture of distal end of right fibula with routine healing   THERAPY DIAG:  No diagnosis found.  Rationale for Evaluation and Treatment: Rehabilitation  ONSET DATE: ***  SUBJECTIVE:  SUBJECTIVE STATEMENT: ***  PERTINENT HISTORY:  Patient is a 52 y.o. female who presents to outpatient physical therapy with a referral for medical diagnosis sprain of anterior talofibular ligament of left knee, closed torus fracture of distal end of right fibula with routine healing. This patient's chief complaints consist of ***, leading to the following functional deficits: ***. Relevant past medical history and comorbidities include MS (multiple sclerosis) (HCC); Vitamin D deficiency; Migraine without aura, not intractable; Nickel allergy; GERD without esophagitis; Other neutropenia (HCC); Dyslipidemia; Diverticulosis; Proctitis; Allergic rhinitis, seasonal; Chronic constipation; Major depression, recurrent (HCC); Essential hypertension; Right sided sciatica; Insulin resistance; Obesity (BMI 30-39.9); Low TSH level; 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 tuberculosis; Eczema; Personal history of colonic polyps; Polyp of descending colon; Chronic bilateral low back pain with left-sided sciatica; Muscular abdominal pain in left upper quadrant; and Intertrigo, bunionectomy, c-section, ganglion cyst excision in bilateral hands, polypectomy. Patient denies hx of {redflags:27294}  PAIN:  Are you having pain? Yes: NPRS scale: Current: ***/10,  Best: ***/10, Worst: ***/10. Pain location: *** Pain  description: *** Aggravating factors: *** Relieving factors: ***   FUNCTIONAL LIMITATIONS: ***  LEISURE: ***  PRECAUTIONS: {Therapy precautions:24002}  WEIGHT BEARING RESTRICTIONS: {Yes ***/No:24003}  FALLS:  Has patient fallen in last 6 months? {fallsyesno:27318}  LIVING ENVIRONMENT: Lives with: {OPRC lives with:25569::"lives with their family"} Lives in: {Lives in:25570} Stairs: {opstairs:27293} Has following equipment at home: {Assistive devices:23999}  OCCUPATION: ***  PLOF: {PLOF:24004}  PATIENT GOALS: ***  NEXT MD VISIT: ***  OBJECTIVE  DIAGNOSTIC FINDINGS:  R ankle MRI report from 03/07/2023:  CLINICAL DATA:  Pain and swelling   EXAM: MRI OF THE RIGHT ANKLE WITHOUT CONTRAST   TECHNIQUE: Multiplanar, multisequence MR imaging of the ankle was performed. No intravenous contrast was administered.   COMPARISON:  Ankle radiograph 01/23/2023, ankle MRI 08/09/2022   FINDINGS: TENDONS   Peroneal: Intact peroneus longus and peroneus brevis tendons.   Posteromedial: Intact tibialis posterior, flexor hallucis longus and flexor digitorum longus tendons.   Anterior: Intact tibialis anterior, extensor hallucis longus and extensor digitorum longus tendons.   Achilles: Intact.   Plantar Fascia: Intact.   LIGAMENTS   Lateral: Anterior talofibular ligament intact. Calcaneofibular ligament intact. Posterior talofibular ligament intact. Anterior and posterior tibiofibular ligaments intact.   Medial: Deltoid ligament intact. Spring ligament intact.   CARTILAGE   Ankle Joint: Trace tibiotalar joint effusion. No osteochondral defect.   Subtalar Joints/Sinus Tarsi: Normal subtalar joints. No subtalar joint effusion. Normal sinus tarsi.   Bones: There is new focal marrow edema within the lateral malleolus with suspected nondisplaced fracture line (coronal T2 image 22). There is also subchondral marrow edema within the anterior aspect of the distal tibia,  within the lateral talar process, and to a lesser degree in the inferior aspect of the talar head/neck. There is focal marrow edema along the plantar lateral cuboid.   Soft Tissue: No focal fluid collection. There is mild soft tissue swelling medially and laterally.   IMPRESSION: New focal marrow edema within the lateral malleolus with suspected associated nondisplaced fracture.   New marrow edema within the anterior aspect of the subchondral distal tibia, within the lateral talar process, and to a lesser degree in the inferior aspect of the talar head/neck, as well as along the plantar lateral cuboid, compatible with contusions and/or stress reaction.   Intact ankle ligaments. No acute tendon tear or significant tenosynovitis.   Mild soft tissue swelling of the ankle medially and laterally.     Electronically  Signed   By: Caprice Renshaw M.D.   On: 03/09/2023 13:14  L tibia/fibula xray report from 01/23/2023:  CLINICAL DATA:  Fall, pain. Fall down 8 stairs in house.   EXAM: LEFT TIBIA AND FIBULA - 2 VIEW   COMPARISON:  Concurrent ankle exam.   FINDINGS: Nondisplaced distal fibular fracture distal to the fibular tip. Proximal fibula is intact. No acute tibial fracture. Ankle alignment assessed on concurrent ankle exam. Knee alignment is maintained. Mild soft tissue edema edema distally.   IMPRESSION: Nondisplaced distal fibular fracture distal to the fibular tip. No additional fracture of the lower leg.     Electronically Signed   By: Narda Rutherford M.D.   On: 01/23/2023 15:55  L ankle xray report from 04/23/2023:  CLINICAL DATA:  Left ankle pain after fall this morning. Fall down 8 stairs in house.   EXAM: LEFT ANKLE COMPLETE - 3+ VIEW   COMPARISON:  None Available.   FINDINGS: Nondisplaced transverse fracture of the distal fibula distal to the ankle mortise. There is no mortise widening. No additional fracture. Minimal ankle joint effusion. Small plantar  calcaneal spur.   IMPRESSION: Nondisplaced distal fibular fracture.     Electronically Signed   By: Narda Rutherford M.D.   On: 01/23/2023 15:53  SELF- REPORTED FUNCTION FOTO score: ***/100 (ankle questionnaire)  OBSERVATION/INSPECTION Posture Posture (seated): forward head, rounded shoulders, slumped in sitting.  Posture (standing): *** Posture correction: *** Anthropometrics Tremor: none Body composition: *** Muscle bulk: *** Skin: The incision sites appear to be healing well with no excessive redness, warmth, drainage or signs of infection present.  *** Edema: *** Functional Mobility Bed mobility: *** Transfers: *** Gait: grossly WFL for household and short community ambulation. More detailed gait analysis deferred to later date as needed. *** Stairs: ***  SPINE MOTION  LUMBAR SPINE AROM *Indicates pain Flexion: *** Extension: *** Side Flexion:   R ***  L *** Rotation:  R *** L *** Side glide:  R *** L ***   NEUROLOGICAL  Upper Motor Neuron Screen Babinski, Hoffman's and Clonus (ankle) negative bilaterally.  Dermatomes C2-T1 appears equal and intact to light touch except the following: *** L2-S2 appears equal and intact to light touch except the following: *** Deep Tendon Reflexes R/L  ***+/***+ Biceps brachii reflex (C5, C6) ***+/***+ Brachioradialis reflex (C6) ***+/***+ Triceps brachii reflex (C7) ***+/***+ Quadriceps reflex (L4) ***+/***+ Achilles reflex (S1)  SPINE MOTION  CERVICAL SPINE AROM *Indicates pain Flexion: *** Extension: *** Side Flexion:   R ***  L *** Rotation:  R *** L ***   PERIPHERAL JOINT MOTION (in degrees)  ACTIVE RANGE OF MOTION (AROM) *Indicates pain Date Date Date  Joint/Motion R/L R/L R/L  Shoulder     Flexion / / /  Extension / / /  Abduction  / / /  External rotation / / /  Internal rotation / / /  Elbow     Flexion  / / /  Extension  / / /  Wrist     Flexion / / /  Extension  / / /  Radial  deviation / / /  Ulnar deviation / / /  Pronation / / /  Supination / / /  Hip     Flexion / / /  Extension  / / /  Abduction / / /  Adduction / / /  External rotation / / /  Internal rotation  / / /  Knee     Extension / / /  Flexoin / / /  Ankle/Foot     Dorsiflexion (knee ext) / / /  Dorsiflexion (knee flex) / / /  Plantarflexion / / /  Everison / / /  Inversion / / /  Great toe extension / / /  Great toe flexion / / /  Comments:   PASSIVE RANGE OF MOTION (PROM) *Indicates pain Date Date Date  Joint/Motion R/L R/L R/L  Shoulder     Flexion / / /  Extension / / /  Abduction  / / /  External rotation / / /  Internal rotation / / /  Elbow     Flexion  / / /  Extension  / / /  Wrist     Flexion / / /  Extension  / / /  Radial deviation / / /  Ulnar deviation / / /  Pronation / / /  Supination / / /  Hip     Flexion  / / /  Extension  / / /  Abduction / / /  Adduction / / /  External rotation / / /  Internal rotation  / / /  Knee     Extension / / /  Flexion / / /  Ankle/Foot     Dorsiflexion (knee ext) / / /  Dorsiflexion (knee flex) / / /  Plantarflexion / / /  Everison / / /  Inversion / / /  Great toe extension / / /  Great toe flexion / / /  Comments:   MUSCLE PERFORMANCE (MMT):  *Indicates pain Date Date Date  Joint/Motion R/L R/L R/L  Shoulder     Flexion / / /  Abduction (C5) / / /  External rotation / / /  Internal rotation / / /  Extension / / /  Elbow     Flexion (C6) / / /  Extension (C7) / / /  Wrist     Flexion (C7) / / /  Extension (C6) / / /  Radial deviation / / /  Ulnar deviation (C8) / / /  Pronation / / /  Supination / / /  Hand     Thumb extension (C8) / / /  Finger abduction (T1) / / /  Grip (C8) / / /  Hip     Flexion (L1, L2) / / /  Extension (knee ext) / / /  Extension (knee flex) / / /  Abduction / / /  Adduction / / /  External rotation / / /  Internal rotation  / / /  Knee     Extension (L3) / / /   Flexion (S2) / / /  Ankle/Foot     Dorsiflexion (L4) / / /  Great toe extension (L5) / / /  Eversion (S1) / / /  Plantarflexion (S1) / / /  Inversion / / /  Pronation / / /  Great toe flexion / / /  Comments:   SPECIAL TESTS:  .Neurodynamictests .NeurodynamicUE .NeurodynamicLE .CspineInstability .CSPINESPECIALTESTS .SHOULDERSPECIALTESTCLUSTERS .HIPSPECIALTESTS .SIJSPECIALTESTS   SHOULDER SPECIAL TESTS RTC, Impingement, Anterior Instability (macrotrauma), Labral Tear: Painful arc test: R = ***, L = ***. Drop arm test: R = ***, L = ***. Hawkins-Kennedy test: R = ***, L = ***. Infraspinatus test: R = ***, L = ***. Apprehension test: R = ***, L = ***. Relocation test: R = ***, L = ***. Active compression test: R = ***, L = ***.  ACCESSORY MOTION: ***  PALPATION: ***  SUSTAINED POSITIONS TESTING:  ***  REPEATED MOTIONS TESTING: ***  FUNCTIONAL/BALANCE TESTS: Five Time Sit to Stand (5TSTS): *** seconds Functional Gait Assessment (FGA): ***/30 (see details above) Ten meter walking trial ( ): *** m/s Six Minute Walk Test ( ): *** feet Timed Up and Go (TUG): *** seconds   Dynamic Gait Index: ***/24 BERG Balance Scale: ***/56 Tinetti/POMA: ***/28 Timed Up and GO: *** seconds (average of 3 trials) Trial 1: *** Trial 2: *** Trial 3: *** Romberg test: -Narrow stance, eyes open: *** seconds -Narrow stance, eyes closed: *** seconds Sharpened Romberg test: -Tandem stance, eyes open: *** seconds -Tandem stance, eyes closed: *** seconds  Narrow stance, firm surface, eyes open: *** seconds Narrow stance, firm surface, eyes closed: *** seconds Narrow stance, compliant surface, eyes open: *** seconds Narrow stance, compliant surface, eyes closed: *** seconds Single leg stance, firm surface, eyes open: R= *** seconds, L= *** seconds Single leg stance, compliant surface, eyes open: R= *** seconds, L= *** seconds Gait speed: *** m/s Functional reach test:  *** inches   TODAY'S TREATMENT:    PATIENT EDUCATION:  Education details: *** Person educated: {Person educated:25204} Education method: {Education Method:25205} Education comprehension: {Education Comprehension:25206}  HOME EXERCISE PROGRAM: ***  ASSESSMENT:  CLINICAL IMPRESSION: Patient is a 53 y.o. female referred to outpatient physical therapy with a medical diagnosis of sprain of anterior talofibular ligament of left knee, closed torus fracture of distal end of right fibula with routine healing who presents with signs and symptoms consistent with ***. Patient presents with significant *** impairments that are limiting ability to complete *** without difficulty. Patient will benefit from skilled physical therapy intervention to address current body structure impairments and activity limitations to improve function and work towards goals set in current POC in order to return to prior level of function or maximal functional improvement.     OBJECTIVE IMPAIRMENTS: {opptimpairments:25111}.   ACTIVITY LIMITATIONS: {activitylimitations:27494}  PARTICIPATION LIMITATIONS: {participationrestrictions:25113}  PERSONAL FACTORS: {Personal factors:25162} are also affecting patient's functional outcome.   REHAB POTENTIAL: {rehabpotential:25112}  CLINICAL DECISION MAKING: {clinical decision making:25114}  EVALUATION COMPLEXITY: {Evaluation complexity:25115}   GOALS: Goals reviewed with patient? {yes/no:20286}  SHORT TERM GOALS: Target date: 05/26/2023  Patient will be independent with initial home exercise program for self-management of symptoms. Baseline: {HEPbaseline4:27310} (05/12/23); Goal status: INITIAL   LONG TERM GOALS: Target date: 08/04/2023  Patient will be independent with a long-term home exercise program for self-management of symptoms.  Baseline: {HEPbaseline4:27310} (05/12/23); Goal status: INITIAL  2.  Patient will demonstrate improved FOTO to equal or greater  than *** by visit #*** to demonstrate improvement in overall condition and self-reported functional ability.  Baseline: *** (05/12/23); Goal status: INITIAL  3.  *** Baseline: *** (05/12/23); Goal status: INITIAL  4.  *** Baseline: *** (05/12/23); Goal status: INITIAL  5.  Patient will complete community, work and/or recreational activities without limitation due to current condition.  Baseline: *** (05/12/23); Goal status: INITIAL  6.  *** Baseline: *** Goal status: INITIAL    PLAN:  PT FREQUENCY: {rehab frequency:25116}  PT DURATION: {rehab duration:25117}  PLANNED INTERVENTIONS: {rehab planned interventions:25118::"Therapeutic exercises","Therapeutic activity","Neuromuscular re-education","Balance training","Gait training","Patient/Family education","Self Care","Joint mobilization"}  PLAN FOR NEXT SESSION: ***   Cira Rue, PT, DPT 05/12/2023, 12:24 PM  [facility] [address] [place], [place] [address] P: [phone] I F: [phone]

## 2023-05-13 ENCOUNTER — Ambulatory Visit
Admission: RE | Admit: 2023-05-13 | Discharge: 2023-05-13 | Disposition: A | Discharge status: Home / Self Care | Payer: Medicare Other | Source: Ambulatory Visit | Attending: Orthopedic Surgery | Admitting: Orthopedic Surgery

## 2023-05-13 DIAGNOSIS — S93492D Sprain of other ligament of left ankle, subsequent encounter: Secondary | ICD-10-CM | POA: Diagnosis not present

## 2023-05-13 DIAGNOSIS — S82832A Other fracture of upper and lower end of left fibula, initial encounter for closed fracture: Secondary | ICD-10-CM | POA: Diagnosis not present

## 2023-05-18 ENCOUNTER — Ambulatory Visit: Payer: Medicare Other | Admitting: Physical Therapy

## 2023-05-19 ENCOUNTER — Ambulatory Visit: Admission: RE | Admit: 2023-05-19 | Payer: Medicare Other | Source: Ambulatory Visit

## 2023-05-20 ENCOUNTER — Ambulatory Visit: Payer: Medicare Other | Admitting: Family Medicine

## 2023-05-23 ENCOUNTER — Encounter: Payer: Medicare Other | Admitting: Physical Therapy

## 2023-05-25 NOTE — Progress Notes (Deleted)
Name: Tammie Lopez   MRN: [id number]    DOB: [birth year]   Date:05/25/2023       Progress Note  Subjective  Chief Complaint  Follow Up  HPI  Left upper abdominal pain: she states worse with movement, like stretching, better when applying pressure to the area.  She has MS and has two lesions on left posterolateral T2 and T 6.  Discussed referral to pain doctor, ortho or psychiatrist to consider nerve block. She has been evaluated by GI - Dr. Servando Snare  and explained not GI related. Reviewed CT with patient She denies problems breathing, soreness radiates to her back She has seen Ortho and had PT , discussed referral to pain management. She states she will discuss it again with ortho   Chronic low back pain with radiculitis: she states pain on left lower back is getting worse and radiating down to foot. She has rx for Lyrica at home but has not been taking it . Dr. Rosita Kea ordered MRI lumbar spine  MPRESSION:done 12/21/2022  1. Grade 1 anterolisthesis at L3-L4 with disc and moderate posterior element degeneration. Questionable tiny (2 mm) synovial cyst on the left directed anteriorly toward the left lateral recess. No significant stenosis, but query left L4 radiculitis. 2. Mild disc degeneration at L4-L5 with a small right subarticular annular fissure of the disc. No associated stenosis. 3. Other visible spinal levels are normal for age.   HTN: BP is at goal. She has been compliant with medications now. She denies chest pain, dizziness or palpitation  BP is at goal    MS: she has been off Tacfidera due to leucopenia, she was going to [facility] neurology, and was taking  Plegridy. She is now seeing Dr. Denice Bors  at [facility] and was off all medications for about 6 months resumed Tecfidera Fall of 2023 - it was stopped in the past due to neutropenia - we will recheck level today  . No side effects of medication    Obesity: she has insulin resistance, took Trulicity,  and Metformin in the past , started  [facility]  October 2022 and went up to 1 mg March 2023 weight was trending down but stopped taking it on her own Summer 2023 due to constipation  and weight is up from 155 lbs to 164 lbs today.She is frustrated    Pre-diabetes: last A1C improved,  last level was 5.6 % .  She has  HTN and increase in abdominal girth, she stopped Ozempic due to constipation, and now it is no longer covered by insurance    Depression: She stopped taking Effexor, tired of taking medications, continues to be upset about her weight, LUQ pain - chronic - having MS, weight gain.   Hot flashes and night sweats: she stopped Effexor , tired of taking pills, she does not want to take anything at this time.   Rash on elbows : waiting to see dermatologist, topical medication helps    Migraine headaches: takes Ubrelvy prn , episodes of headaches are down to 2-3 times per month, she has associated photophobia but denies associated nausea or vomiting    Patient Active Problem List   Diagnosis Date Noted   Chronic bilateral low back pain with left-sided sciatica 11/25/2022   Muscular abdominal pain in left upper quadrant 11/25/2022   Intertrigo 11/25/2022   Personal history of colonic polyps    Polyp of descending colon    Eczema 01/20/2021   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  tuberculosis 07/16/2020   Low TSH level 09/19/2019   Obesity (BMI 30-39.9) 12/06/2017   Insulin resistance 09/27/2017   Essential hypertension 09/21/2017   Right sided sciatica 09/21/2017   Major depression, recurrent (HCC) 05/24/2017   Migraine without aura, not intractable 12/09/2016   Nickel allergy 12/09/2016   GERD without esophagitis 12/09/2016   Other neutropenia (HCC) 12/09/2016   Dyslipidemia 12/09/2016   Diverticulosis 12/09/2016   Proctitis 12/09/2016   Allergic rhinitis, seasonal 12/09/2016   Chronic constipation 12/09/2016   Vitamin D deficiency 08/06/2013   MS (multiple sclerosis) (HCC) 04/17/2013     Past Surgical History:  Procedure Laterality Date   BREAST BIOPSY Left 01/06/2018    2 areas  neg   BUNIONECTOMY     CESAREAN SECTION N/A 2006   COLONOSCOPY WITH PROPOFOL N/A 02/09/2021   Procedure: COLONOSCOPY WITH PROPOFOL;  Surgeon: Midge Minium, MD;  Location: [facility];  Service: Endoscopy;  Laterality: N/A;   GANGLION CYST EXCISION Bilateral    Hands   POLYPECTOMY N/A 02/09/2021   Procedure: POLYPECTOMY;  Surgeon: Midge Minium, MD;  Location: [facility];  Service: Endoscopy;  Laterality: N/A;    Family History  Problem Relation Age of Onset   Diabetes Mother    Hypertension Mother    Cancer Father 54       Mouth   COPD Father    Hypertension Brother    Diabetes Brother    Breast cancer Neg Hx     Social History   Tobacco Use   Smoking status: Never   Smokeless tobacco: Never   Tobacco comments:    smoking cessation materials not required  Substance Use Topics   Alcohol use: No     Current Outpatient Medications:    amLODipine (NORVASC) 2.5 MG tablet, TAKE 1 TABLET BY MOUTH EVERY DAY, Disp: 90 tablet, Rfl: 0   Cholecalciferol (VITAMIN D) 50 MCG (2000 UT) CAPS, Take 2,000 Units by mouth daily., Disp: , Rfl:    dicyclomine (BENTYL) 20 MG tablet, Take 1 tablet (20 mg total) by mouth 3 (three) times daily before meals., Disp: 30 tablet, Rfl: 4   Dimethyl Fumarate (TECFIDERA) 240 MG CPDR, Take 1 capsule by mouth in the morning and at bedtime., Disp: , Rfl:    estradiol (ESTRACE) 0.1 MG/GM vaginal cream, SMARTSIG:2 Gram(s) Vaginal Twice a Week, Disp: , Rfl:    hydrocortisone (ANUSOL-HC) 2.5 % rectal cream, Place 1 Application rectally 2 (two) times daily., Disp: 30 g, Rfl: 1   naproxen (NAPROSYN) 500 MG tablet, Take 1 tablet (500 mg total) by mouth 2 (two) times daily with a meal., Disp: 30 tablet, Rfl: 0   omeprazole (PRILOSEC) 40 MG capsule, TAKE 1 CAPSULE BY MOUTH EVERY DAY, Disp: 90 capsule, Rfl: 1   Plecanatide (TRULANCE) 3 MG TABS, Take by  mouth., Disp: , Rfl:    traZODone (DESYREL) 50 MG tablet, TAKE 1/2 TO 1 TABLET BY MOUTH AT BEDTIME AS NEEDED FOR SLEEP, Disp: 90 tablet, Rfl: 0   triamcinolone cream (KENALOG) 0.1 %, Apply 1 Application topically 2 (two) times daily., Disp: 30 g, Rfl: 0   Ubrogepant (UBRELVY) 100 MG TABS, Take 1 tablet by mouth daily as needed. For migraine headaches, Disp: 16 tablet, Rfl: 1  Allergies  Allergen Reactions   Penicillins Hives    I personally reviewed active problem list, medication list, allergies, family history, social history, health maintenance with the patient/caregiver today.   ROS  ***  Objective  There were no vitals filed  for this visit.  There is no height or weight on file to calculate BMI.  Physical Exam ***   PHQ2/9:    04/12/2023    2:01 PM 02/15/2023    8:49 AM 11/25/2022    8:57 AM 09/02/2022   10:02 AM 08/25/2022   10:59 AM  Depression screen PHQ 2/9  Decreased Interest 2 1 0 0 0  Down, Depressed, Hopeless 2 1 0 0 0  PHQ - 2 Score 4 2 0 0 0  Altered sleeping 1 1 0 3 0  Tired, decreased energy 0 1 0 0 0  Change in appetite 0 0 0 0 0  Feeling bad or failure about yourself  1 0 0 0 0  Trouble concentrating 0 1 0 0 0  Moving slowly or fidgety/restless 0 0 0 0 0  Suicidal thoughts 0 0 0 0 0  PHQ-9 Score 6 5 0 3 0  Difficult doing work/chores  Somewhat difficult       phq 9 is {gen pos LKG:401027}   Fall Risk:    04/12/2023    2:01 PM 02/15/2023    8:48 AM 11/25/2022    8:57 AM 09/02/2022   10:02 AM 08/25/2022   10:59 AM  Fall Risk   Falls in the past year? 1 1 0 1 1  Number falls in past yr: 0 1  0 0  Injury with Fall? 1 1  1 1   Risk for fall due to : No Fall Risks Impaired balance/gait No Fall Risks No Fall Risks History of fall(s)  Follow up Falls prevention discussed Falls prevention discussed;Education provided;Falls evaluation completed Falls prevention discussed;Education provided;Falls evaluation completed Falls prevention discussed Falls  prevention discussed;Education provided;Falls evaluation completed      Functional Status Survey:      Assessment & Plan  *** There are no diagnoses linked to this encounter.

## 2023-05-26 ENCOUNTER — Ambulatory Visit: Payer: Medicare Other | Admitting: Family Medicine

## 2023-05-26 ENCOUNTER — Encounter: Payer: Medicare Other | Admitting: Physical Therapy

## 2023-05-31 ENCOUNTER — Encounter: Payer: Medicare Other | Admitting: Physical Therapy

## 2023-05-31 DIAGNOSIS — S99811A Other specified injuries of right ankle, initial encounter: Secondary | ICD-10-CM | POA: Diagnosis not present

## 2023-05-31 DIAGNOSIS — S99812A Other specified injuries of left ankle, initial encounter: Secondary | ICD-10-CM | POA: Diagnosis not present

## 2023-05-31 DIAGNOSIS — M65871 Other synovitis and tenosynovitis, right ankle and foot: Secondary | ICD-10-CM | POA: Diagnosis not present

## 2023-05-31 DIAGNOSIS — G5782 Other specified mononeuropathies of left lower limb: Secondary | ICD-10-CM | POA: Diagnosis not present

## 2023-06-06 ENCOUNTER — Encounter: Payer: Medicare Other | Admitting: Physical Therapy

## 2023-06-08 ENCOUNTER — Encounter: Payer: Medicare Other | Admitting: Physical Therapy

## 2023-06-13 ENCOUNTER — Other Ambulatory Visit: Payer: Self-pay | Admitting: Internal Medicine

## 2023-06-13 ENCOUNTER — Encounter: Payer: Medicare Other | Admitting: Physical Therapy

## 2023-06-13 DIAGNOSIS — I10 Essential (primary) hypertension: Secondary | ICD-10-CM

## 2023-06-14 NOTE — Telephone Encounter (Signed)
Patient called, left VM to return the call to the office to schedule an OV for follow up.  ? ?

## 2023-06-14 NOTE — Telephone Encounter (Signed)
Courtesy refill given, appointment needed.   Requested Prescriptions  Pending Prescriptions Disp Refills   amLODipine (NORVASC) 2.5 MG tablet [Pharmacy Med Name: AMLODIPINE BESYLATE 2.5 MG TAB] 30 tablet 0    Sig: Take 1 tablet (2.5 mg total) by mouth daily. OFFICE VISIT NEEDED FOR ADDITIONAL REFILLS     Cardiovascular: Calcium Channel Blockers 2 Passed - 06/13/2023  1:19 PM      Passed - Last BP in normal range    BP Readings from Last 1 Encounters:  04/12/23 132/80         Passed - Last Heart Rate in normal range    Pulse Readings from Last 1 Encounters:  04/12/23 92         Passed - Valid encounter within last 6 months    Recent Outpatient Visits           2 months ago MS (multiple sclerosis) [facility])   [place] [facility] Alba Cory, MD   3 months ago Dermatitis   [facility] Mecum, Oswaldo Conroy, PA-C   6 months ago Muscular abdominal pain in left upper quadrant   [facility] Alba Cory, MD   9 months ago Well adult exam   [facility] Alba Cory, MD   9 months ago MS (multiple sclerosis) [facility])   [facility] Alba Cory, MD       Future Appointments             In 2 weeks Terri Piedra, DO [facility]

## 2023-06-16 ENCOUNTER — Encounter: Payer: Medicare Other | Admitting: Physical Therapy

## 2023-06-20 ENCOUNTER — Encounter: Payer: Medicare Other | Admitting: Physical Therapy

## 2023-06-23 ENCOUNTER — Encounter: Payer: Medicare Other | Admitting: Physical Therapy

## 2023-06-27 ENCOUNTER — Encounter: Payer: Medicare Other | Admitting: Physical Therapy

## 2023-06-28 ENCOUNTER — Ambulatory Visit: Payer: Medicare Other | Admitting: Dermatology

## 2023-06-30 ENCOUNTER — Encounter: Payer: Medicare Other | Admitting: Physical Therapy

## 2023-07-04 ENCOUNTER — Other Ambulatory Visit: Payer: Self-pay | Admitting: Family Medicine

## 2023-07-04 DIAGNOSIS — Z1231 Encounter for screening mammogram for malignant neoplasm of breast: Secondary | ICD-10-CM

## 2023-07-07 ENCOUNTER — Ambulatory Visit
Admission: RE | Admit: 2023-07-07 | Discharge: 2023-07-07 | Disposition: A | Discharge status: Home / Self Care | Payer: Medicare Other | Source: Ambulatory Visit | Attending: Family Medicine | Admitting: Family Medicine

## 2023-07-07 ENCOUNTER — Other Ambulatory Visit: Payer: Self-pay | Admitting: Family Medicine

## 2023-07-07 DIAGNOSIS — Z1231 Encounter for screening mammogram for malignant neoplasm of breast: Secondary | ICD-10-CM | POA: Insufficient documentation

## 2023-07-07 DIAGNOSIS — I10 Essential (primary) hypertension: Secondary | ICD-10-CM

## 2023-07-11 ENCOUNTER — Other Ambulatory Visit: Payer: Self-pay

## 2023-07-11 ENCOUNTER — Other Ambulatory Visit: Payer: Self-pay | Admitting: Family Medicine

## 2023-07-11 DIAGNOSIS — R928 Other abnormal and inconclusive findings on diagnostic imaging of breast: Secondary | ICD-10-CM

## 2023-07-11 DIAGNOSIS — N6489 Other specified disorders of breast: Secondary | ICD-10-CM

## 2023-07-11 NOTE — Progress Notes (Signed)
Added views

## 2023-07-15 ENCOUNTER — Ambulatory Visit
Admission: RE | Admit: 2023-07-15 | Discharge: 2023-07-15 | Disposition: A | Discharge status: Home / Self Care | Payer: Medicare Other | Source: Ambulatory Visit | Attending: Family Medicine | Admitting: Family Medicine

## 2023-07-15 DIAGNOSIS — R928 Other abnormal and inconclusive findings on diagnostic imaging of breast: Secondary | ICD-10-CM | POA: Insufficient documentation

## 2023-07-15 DIAGNOSIS — N6489 Other specified disorders of breast: Secondary | ICD-10-CM | POA: Diagnosis not present

## 2023-07-15 DIAGNOSIS — R92333 Mammographic heterogeneous density, bilateral breasts: Secondary | ICD-10-CM | POA: Diagnosis not present

## 2023-07-19 ENCOUNTER — Other Ambulatory Visit: Payer: Medicare Other

## 2023-09-07 DIAGNOSIS — H35033 Hypertensive retinopathy, bilateral: Secondary | ICD-10-CM | POA: Diagnosis not present

## 2023-09-08 ENCOUNTER — Other Ambulatory Visit: Payer: Self-pay | Admitting: Podiatry

## 2023-09-08 DIAGNOSIS — S92909K Unspecified fracture of unspecified foot, subsequent encounter for fracture with nonunion: Secondary | ICD-10-CM

## 2023-09-08 DIAGNOSIS — S82899K Other fracture of unspecified lower leg, subsequent encounter for closed fracture with nonunion: Secondary | ICD-10-CM | POA: Diagnosis not present

## 2023-09-08 DIAGNOSIS — M25572 Pain in left ankle and joints of left foot: Secondary | ICD-10-CM | POA: Diagnosis not present

## 2023-09-16 ENCOUNTER — Ambulatory Visit (INDEPENDENT_AMBULATORY_CARE_PROVIDER_SITE_OTHER): Payer: 59

## 2023-09-16 VITALS — Ht 62.0 in | Wt 164.0 lb

## 2023-09-16 DIAGNOSIS — Z0000 Encounter for general adult medical examination without abnormal findings: Secondary | ICD-10-CM | POA: Diagnosis not present

## 2023-09-16 NOTE — Progress Notes (Signed)
Subjective:   Tammie Lopez is a 52 y.o. female who presents for Medicare Annual (Subsequent) preventive examination.  Visit Complete: Virtual I connected with  Tammie Lopez on 09/16/23 by a audio enabled telemedicine application and verified that I am speaking with the correct person using two identifiers.  Patient Location: Home  Provider Location: Office/Clinic  I discussed the limitations of evaluation and management by telemedicine. The patient expressed understanding and agreed to proceed.  Vital Signs: Because this visit was a virtual/telehealth visit, some criteria may be missing or patient reported. Any vitals not documented were not able to be obtained and vitals that have been documented are patient reported.  Patient Medicare AWV questionnaire was completed by the patient on (not done); I have confirmed that all information answered by patient is correct and no changes since this date. Cardiac Risk Factors include: advanced age (>75men, >57 women);dyslipidemia;hypertension;obesity (BMI >30kg/m2);sedentary lifestyle    Objective:    Today's Vitals   09/16/23 0857 09/16/23 0858  Weight: 164 lb (74.4 kg)   Height: 5\' 2"  (1.575 m)   PainSc:  5    Body mass index is 30 kg/m.     09/16/2023    9:14 AM 01/23/2023    3:08 PM 01/21/2023    8:45 AM 06/03/2022    9:36 AM 04/06/2022    9:36 AM 02/09/2022    1:49 PM 10/20/2021    9:13 AM  Advanced Directives  Does Patient Have a Medical Advance Directive? No No No No No No No  Would patient like information on creating a medical advance directive?    No - Patient declined  No - Patient declined     Current Medications (verified) Outpatient Encounter Medications as of 09/16/2023  Medication Sig   amLODipine (NORVASC) 2.5 MG tablet TAKE 1 TABLET (2.5 MG TOTAL) BY MOUTH DAILY. OFFICE VISIT NEEDED FOR ADDITIONAL REFILLS   dicyclomine (BENTYL) 20 MG tablet Take 1 tablet (20 mg total) by mouth 3 (three) times daily before meals.    Dimethyl Fumarate (TECFIDERA) 240 MG CPDR Take 1 capsule by mouth in the morning and at bedtime.   estradiol (ESTRACE) 0.1 MG/GM vaginal cream SMARTSIG:2 Gram(s) Vaginal Twice a Week   hydrocortisone (ANUSOL-HC) 2.5 % rectal cream Place 1 Application rectally 2 (two) times daily.   naproxen (NAPROSYN) 500 MG tablet Take 1 tablet (500 mg total) by mouth 2 (two) times daily with a meal.   omeprazole (PRILOSEC) 40 MG capsule TAKE 1 CAPSULE BY MOUTH EVERY DAY   Plecanatide (TRULANCE) 3 MG TABS Take by mouth.   traZODone (DESYREL) 50 MG tablet TAKE 1/2 TO 1 TABLET BY MOUTH AT BEDTIME AS NEEDED FOR SLEEP   Ubrogepant (UBRELVY) 100 MG TABS Take 1 tablet by mouth daily as needed. For migraine headaches   Cholecalciferol (VITAMIN D) 50 MCG (2000 UT) CAPS Take 2,000 Units by mouth daily. (Patient not taking: Reported on 09/16/2023)   triamcinolone cream (KENALOG) 0.1 % Apply 1 Application topically 2 (two) times daily. (Patient not taking: Reported on 09/16/2023)   No facility-administered encounter medications on file as of 09/16/2023.    Allergies (verified) Penicillins   History: Past Medical History:  Diagnosis Date   Allergy    Constipation    Diverticula of colon    Family history of adverse reaction to anesthesia    mother's BP increased   Hypertension    Insomnia    Migraine    migraines   MS (multiple sclerosis) (HCC)  Muscle spasm    Past Surgical History:  Procedure Laterality Date   BREAST BIOPSY Left 01/06/2018    2 areas  neg   BUNIONECTOMY     CESAREAN SECTION N/A 2006   COLONOSCOPY WITH PROPOFOL N/A 02/09/2021   Procedure: COLONOSCOPY WITH PROPOFOL;  Surgeon: Midge Minium, MD;  Location[facility];  Service: Endoscopy;  Laterality: N/A;   GANGLION CYST EXCISION Bilateral    Hands   POLYPECTOMY N/A 02/09/2021   Procedure: POLYPECTOMY;  Surgeon: Midge Minium, MD;  Location: [facility];  Service: Endoscopy;  Laterality: N/A;   Family History   Problem Relation Age of Onset   Diabetes Mother    Hypertension Mother    Cancer Father 18       Mouth   COPD Father    Hypertension Brother    Diabetes Brother    Breast cancer Neg Hx    Social History   Socioeconomic History   Marital status: Single    Spouse name: Not on file   Number of children: 3   Years of education: Not on file   Highest education level: High school graduate  Occupational History    Comment: bus driver  Tobacco Use   Smoking status: Never   Smokeless tobacco: Never   Tobacco comments:    smoking cessation materials not required  Vaping Use   Vaping status: Never Used  Substance and Sexual Activity   Alcohol use: No   Drug use: No   Sexual activity: Not Currently    Partners: Male  Other Topics Concern   Not on file  Social History Narrative   She is on disability for MS, able to work 20 hours per week, 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   Social Determinants of Health   Financial Resource Strain: Low Risk  (09/16/2023)   Overall Financial Resource Strain (CARDIA)    Difficulty of Paying Living Expenses: Not hard at all  Food Insecurity: No Food Insecurity (09/16/2023)   Hunger Vital Sign    Worried About Running Out of Food in the Last Year: Never true    Ran Out of Food in the Last Year: Never true  Transportation Needs: No Transportation Needs (09/16/2023)   PRAPARE - Administrator, Civil Service (Medical): No    Lack of Transportation (Non-Medical): No  Physical Activity: Inactive (09/16/2023)   Exercise Vital Sign    Days of Exercise per Week: 0 days    Minutes of Exercise per Session: 0 min  Stress: No Stress Concern Present (09/16/2023)   [employer] of Occupational Health - Occupational Stress Questionnaire    Feeling of Stress : Not at all  Social Connections: Moderately Isolated (09/16/2023)   Social Connection and Isolation Panel [NHANES]    Frequency of Communication with Friends and Family: More  than three times a week    Frequency of Social Gatherings with Friends and Family: Once a week    Attends Religious Services: More than 4 times per year    Active Member of [employer] or Organizations: No    Attends Engineer, structural: Never    Marital Status: Never married    Tobacco Counseling Counseling given: Not Answered Tobacco comments: smoking cessation materials not required   Clinical Intake:  Pre-visit preparation completed: Yes  Pain : 0-10 Pain Score: 5  Pain Type: Chronic pain Pain Location: Ankle Pain Orientation: Left Pain Descriptors / Indicators: Aching, Shooting Pain Onset: More than a month  ago Pain Frequency: Intermittent Pain Relieving Factors: elevating helps; gabapentin  Pain Relieving Factors: elevating helps; gabapentin  BMI - recorded: 30 Nutritional Status: BMI > 30  Obese Nutritional Risks: None Diabetes: No  How often do you need to have someone help you when you read instructions, pamphlets, or other written materials from your doctor or pharmacy?: 1 - Never  Interpreter Needed?: No  Comments: lives alone mostly Information entered by :: B.Levi Crass,LPN   Activities of Daily Living    09/16/2023    9:14 AM 04/12/2023    2:01 PM  In your present state of health, do you have any difficulty performing the following activities:  Hearing? 0 0  Vision? 0 0  Difficulty concentrating or making decisions? 0 0  Walking or climbing stairs? 1 1  Dressing or bathing? 0 0  Doing errands, shopping? 0 0  Preparing Food and eating ? N   Using the Toilet? N   In the past six months, have you accidently leaked urine? N   Do you have problems with loss of bowel control? N   Managing your Medications? N   Managing your Finances? N   Housekeeping or managing your Housekeeping? N     Patient Care Team: Alba Cory, MD as PCP - General (Family Medicine) Maryla Morrow as Physician Assistant (Physician Assistant) Midge Minium, MD  as Consulting Physician (Gastroenterology) Conard Novak, MD (Obstetrics and Gynecology)  Indicate any recent Medical Services you may have received from other than [facility] providers in the past year (date may be approximate).     Assessment:   This is a routine wellness examination for Tammie Lopez.  Hearing/Vision screen Hearing Screening - Comments:: Pt says she hears good Vision Screening - Comments:: Pt says her vision is well:readers only Dr Santiago Bumpers   Goals Addressed             This Visit's Progress    COMPLETED: DIET - INCREASE WATER INTAKE   On track    Recommend drinking 6-8 glasses of water per day      Increase physical activity   Not on track    Recommend increasing physical activity to 150 minutes per week       Depression Screen    09/16/2023    9:11 AM 04/12/2023    2:01 PM 02/15/2023    8:49 AM 11/25/2022    8:57 AM 09/02/2022   10:02 AM 08/25/2022   10:59 AM 05/28/2022   11:09 AM  PHQ 2/9 Scores  PHQ - 2 Score 4 4 2  0 0 0 0  PHQ- 9 Score 6 6 5  0 3 0 3    Fall Risk    09/16/2023    9:02 AM 04/12/2023    2:01 PM 02/15/2023    8:48 AM 11/25/2022    8:57 AM 09/02/2022   10:02 AM  Fall Risk   Falls in the past year? 1 1 1  0 1  Number falls in past yr: 0 0 1  0  Injury with Fall? 1 1 1  1   Comment left ankle broken      Risk for fall due to : Impaired balance/gait;Impaired mobility No Fall Risks Impaired balance/gait No Fall Risks No Fall Risks  Follow up Education provided;Falls prevention discussed Falls prevention discussed Falls prevention discussed;Education provided;Falls evaluation completed Falls prevention discussed;Education provided;Falls evaluation completed Falls prevention discussed    MEDICARE RISK AT HOME: Medicare Risk at Home Any stairs in or around the home?: Yes  If so, are there any without handrails?: Yes Home free of loose throw rugs in walkways, pet beds, electrical cords, etc?: Yes Adequate lighting in your home to reduce risk  of falls?: Yes Life alert?: No Use of a cane, walker or w/c?: Yes (cane occasionally) Grab bars in the bathroom?: No Shower chair or bench in shower?: No Elevated toilet seat or a handicapped toilet?: No  TIMED UP AND GO:  Was the test performed?  No    Cognitive Function:        09/16/2023    9:15 AM 11/24/2018   10:50 AM  6CIT Screen  What Year? 0 points 0 points  What month? 0 points 0 points  What time? 0 points 0 points  Count back from 20 0 points 0 points  Months in reverse 0 points 0 points  Repeat phrase 0 points 0 points  Total Score 0 points 0 points    Immunizations Immunization History  Administered Date(s) Administered   Influenza-Unspecified 11/19/2013, 11/04/2014   PFIZER(Purple Top)SARS-COV-2 Vaccination 04/17/2020, 05/08/2020   PNEUMOCOCCAL CONJUGATE-20 09/02/2022   Tdap 01/29/2013   Zoster Recombinant(Shingrix) 05/28/2022, 07/28/2022    TDAP status: Up to date  Flu Vaccine status: Declined, Education has been provided regarding the importance of this vaccine but patient still declined. Advised may receive this vaccine at local pharmacy or Health Dept. Aware to provide a copy of the vaccination record if obtained from local pharmacy or Health Dept. Verbalized acceptance and understanding.  Pneumococcal vaccine status: Up to date  Covid-19 vaccine status: Completed vaccines  Qualifies for Shingles Vaccine? Yes   Zostavax completed Yes   Shingrix Completed?: Yes  Screening Tests Health Maintenance  Topic Date Due   COVID-19 Vaccine (3 - Pfizer risk series) 10/02/2023 (Originally 06/05/2020)   DTaP/Tdap/Td (2 - Td or Tdap) 09/15/2024 (Originally 01/30/2023)   MAMMOGRAM  07/06/2024   Medicare Annual Wellness (AWV)  09/15/2024   Cervical Cancer Screening (HPV/Pap Cotest)  06/11/2026   Colonoscopy  02/10/2028   Hepatitis C Screening  Completed   HIV Screening  Completed   Zoster Vaccines- Shingrix  Completed   HPV VACCINES  Aged Out   INFLUENZA  VACCINE  Discontinued    Health Maintenance  There are no preventive care reminders to display for this patient.   Colorectal cancer screening: Type of screening: Colonoscopy. Completed 02/09/2021. Repeat every 7 years  Mammogram status: Completed 07/07/23. Repeat every year  Lung Cancer Screening: (Low Dose CT Chest recommended if Age 53-80 years, 20 pack-year currently smoking OR have quit w/in 15years.) does not qualify.   Lung Cancer Screening Referral: no  Additional Screening:  Hepatitis C Screening: does not qualify; Completed 11/24/2018  Vision Screening: Recommended annual ophthalmology exams for early detection of glaucoma and other disorders of the eye. Is the patient up to date with their annual eye exam?  Yes  Who is the provider or what is the name of the office in which the patient attends annual eye exams? Dr Santiago Bumpers  If pt is not established with a provider, would they like to be referred to a provider to establish care? No .   Dental Screening: Recommended annual dental exams for proper oral hygiene  Diabetic Foot Exam: n/a  Community Resource Referral / Chronic Care Management: CRR required this visit?  No   CCM required this visit?  Appt scheduled with PCP     Plan:     I have personally reviewed and noted the following in the patient's chart:  Medical and social history Use of alcohol, tobacco or illicit drugs  Current medications and supplements including opioid prescriptions. Patient is not currently taking opioid prescriptions. Functional ability and status Nutritional status Physical activity Advanced directives List of other physicians Hospitalizations, surgeries, and ER visits in previous 12 months Vitals Screenings to include cognitive, depression, and falls Referrals and appointments  In addition, I have reviewed and discussed with patient certain preventive protocols, quality metrics, and best practice recommendations. A written  personalized care plan for preventive services as well as general preventive health recommendations were provided to patient.     [name], LPN   16/08/[id number]   After Visit Summary: (MyChart) Due to this being a telephonic visit, the after visit summary with patients personalized plan was offered to patient via MyChart   Nurse Notes: Pt relays she is trying to manage back pain, low energy levels and weight gain. She denies any suicide ideation but says she gets depressed regarding MS diagnosis. Appt made with PCP for refills and referral pt desires.

## 2023-09-16 NOTE — Patient Instructions (Signed)
Ms. Panzer , Thank you for taking time to come for your Medicare Wellness Visit. I appreciate your ongoing commitment to your health goals. Please review the following plan we discussed and let me know if I can assist you in the future.   Referrals/Orders/Follow-Ups/Clinician Recommendations: none  This is a list of the screening recommended for you and due dates:  Health Maintenance  Topic Date Due   COVID-19 Vaccine (3 - Pfizer risk series) 10/02/2023*   DTaP/Tdap/Td vaccine (2 - Td or Tdap) 09/15/2024*   Mammogram  07/06/2024   Medicare Annual Wellness Visit  09/15/2024   Pap with HPV screening  06/11/2026   Colon Cancer Screening  02/10/2028   Hepatitis C Screening  Completed   HIV Screening  Completed   Zoster (Shingles) Vaccine  Completed   HPV Vaccine  Aged Out   Flu Shot  Discontinued  *Topic was postponed. The date shown is not the original due date.    Advanced directives: (Declined) Advance directive discussed with you today. Even though you declined this today, please call our office should you change your mind, and we can give you the proper paperwork for you to fill out.  Next Medicare Annual Wellness Visit scheduled for next year: Yes 09/20/24 @9 :35am telephone

## 2023-09-24 ENCOUNTER — Ambulatory Visit
Admission: RE | Admit: 2023-09-24 | Discharge: 2023-09-24 | Disposition: A | Discharge status: Home / Self Care | Payer: 59 | Source: Ambulatory Visit | Attending: Podiatry | Admitting: Podiatry

## 2023-09-24 DIAGNOSIS — M7662 Achilles tendinitis, left leg: Secondary | ICD-10-CM | POA: Diagnosis not present

## 2023-09-24 DIAGNOSIS — Z8781 Personal history of (healed) traumatic fracture: Secondary | ICD-10-CM | POA: Diagnosis not present

## 2023-09-24 DIAGNOSIS — S92909K Unspecified fracture of unspecified foot, subsequent encounter for fracture with nonunion: Secondary | ICD-10-CM

## 2023-09-26 DIAGNOSIS — G35 Multiple sclerosis: Secondary | ICD-10-CM | POA: Diagnosis not present

## 2023-09-26 DIAGNOSIS — Z79899 Other long term (current) drug therapy: Secondary | ICD-10-CM | POA: Diagnosis not present

## 2023-10-03 NOTE — Progress Notes (Unsigned)
Name: Tammie Lopez   MRN: [id number]    DOB: October 26, 1971   Date:10/04/2023       Progress Note  Subjective  Chief Complaint  Back Pain/ Medication Refill  HPI  Left upper abdominal pain: she states worse with movement, like stretching, better when applying pressure to the area.  She has MS and has two lesions on left posterolateral T2 and T 6.  Discussed referral to pain doctor, ortho or psychiatrist to consider nerve block. She has been evaluated by GI - Dr. Servando Snare  and explained not GI related. She denies problems breathing, soreness radiates to her back She has seen Ortho and had PT.    Chronic low back pain with radiculitis: she states pain on left lower back is getting worse and radiating down to foot. She  tried Lyrica in the past Dr. Rosita Kea ordered MRI lumbar spine. She states since she gained weight seems like the increase in abdominal girth is causing more back pain   MPRESSION:done 12/21/2022  1. Grade 1 anterolisthesis at L3-L4 with disc and moderate posterior element degeneration. Questionable tiny (2 mm) synovial cyst on the left directed anteriorly toward the left lateral recess. No significant stenosis, but query left L4 radiculitis. 2. Mild disc degeneration at L4-L5 with a small right subarticular annular fissure of the disc. No associated stenosis. 3. Other visible spinal levels are normal for age.   HTN: BP is at goal. She has been compliant with medications She denies chest pain, dizziness or palpitation . Refills sent    MS: she has been off Tacfidera due to leucopenia, she was going to [facility] neurology, and was taking  Plegridy. She is now seeing Dr. Denice Bors  at [facility] and was off all medications for about 6 months resumed Tecfidera Fall of 2023 - it was stopped in the past due to neutropenia - last WBC was 3.2 , waiting for a refill from neurologist. She has been out for over one week    Obesity: she has insulin resistance, took Trulicity,  and Metformin in the  past , started [facility]  October 2022 and went up to 1 mg March 2023 weight was trending down but stopped taking it on her own Summer 2023 due to constipation  and weight is up from 155 lbs to 164 lbs today.She is frustrated    Pre-diabetes: last A1C improved,  last A1C was 5.7 % today is up to 6.1 % - still not diabetes but discussed importance of life style modification .   She has  HTN and increase in abdominal girth, she stopped Ozempic due to constipation, and now it is no longer covered by insurance. She has noticed nocturia lately . She is willing to try Metformin again    MDD mild recurrent : She stopped taking Effexor, tired of taking medications, continues to be upset about her weight, LUQ pain - chronic - having MS, weight gain and gets frustrated   Hot flashes and night sweats: she stopped Effexor , tired of taking pills, she does not want to take anything at this time. Unchanged   Intertrigo: worse during the Summer and uses prn topical medication. She also had to take diflucan, gaining weight is not helping with symptoms due to abdomina fold   Migraine headaches: takes Ubrelvy prn and she needs a refill today, episodes of headaches are down to 2-3 times per month, she has associated photophobia but denies associated nausea or vomiting .    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 07/16/2020   Low TSH level 09/19/2019   Obesity (BMI 30-39.9) 12/06/2017   Insulin resistance 09/27/2017   Essential hypertension 09/21/2017   Right sided sciatica 09/21/2017   Major depression, recurrent (HCC) 05/24/2017   Migraine without aura, not intractable 12/09/2016   Nickel  allergy 12/09/2016   GERD without esophagitis 12/09/2016   Other neutropenia (HCC) 12/09/2016   Dyslipidemia 12/09/2016   Diverticulosis 12/09/2016   Proctitis 12/09/2016   Allergic rhinitis, seasonal 12/09/2016   Chronic constipation 12/09/2016   Vitamin D deficiency 08/06/2013   MS (multiple sclerosis) (HCC) 04/17/2013    Past Surgical History:  Procedure Laterality Date   BREAST BIOPSY Left 01/06/2018    2 areas  neg   BUNIONECTOMY     CESAREAN SECTION N/A 2006   COLONOSCOPY WITH PROPOFOL N/A 02/09/2021   Procedure: COLONOSCOPY WITH PROPOFOL;  Surgeon: Midge Minium, MD;  Location: [facility];  Service: Endoscopy;  Laterality: N/A;   GANGLION CYST EXCISION Bilateral    Hands   POLYPECTOMY N/A 02/09/2021   Procedure: POLYPECTOMY;  Surgeon: Midge Minium, MD;  Location: [facility];  Service: Endoscopy;  Laterality: N/A;    Family History  Problem Relation Age of Onset   Diabetes Mother    Hypertension Mother    Cancer Father 72       Mouth   COPD Father    Hypertension Brother    Diabetes Brother    Breast cancer Neg Hx     Social History   Tobacco Use   Smoking status: Never   Smokeless tobacco: Never   Tobacco comments:    smoking cessation materials not required  Substance Use Topics   Alcohol use: No     Current Outpatient Medications:    amLODipine (NORVASC) 2.5 MG tablet, TAKE 1 TABLET (2.5 MG TOTAL) BY MOUTH DAILY. OFFICE VISIT NEEDED FOR ADDITIONAL REFILLS, Disp: 90 tablet, Rfl: 0   Cholecalciferol (VITAMIN D) 50 MCG (2000 UT) CAPS, Take 2,000 Units by mouth daily., Disp: , Rfl:    dicyclomine (BENTYL) 20 MG tablet, Take 1 tablet (20 mg total) by mouth 3 (three) times daily before meals., Disp: 30 tablet, Rfl: 4   Dimethyl Fumarate (TECFIDERA) 240 MG CPDR, Take 1 capsule by mouth in the morning and at bedtime., Disp: , Rfl:    naproxen (NAPROSYN) 500 MG tablet, Take 1 tablet (500 mg total) by mouth 2 (two) times daily with a meal., Disp:  30 tablet, Rfl: 0   omeprazole (PRILOSEC) 40 MG capsule, TAKE 1 CAPSULE BY MOUTH EVERY DAY, Disp: 90 capsule, Rfl: 1   Plecanatide (TRULANCE) 3 MG TABS, Take by mouth., Disp: , Rfl:    traZODone (DESYREL) 50 MG tablet, TAKE 1/2 TO 1 TABLET BY MOUTH AT BEDTIME AS NEEDED FOR SLEEP, Disp: 90 tablet, Rfl: 0   Ubrogepant (UBRELVY) 100 MG TABS, Take 1 tablet by mouth daily as needed. For migraine headaches, Disp: 16 tablet, Rfl: 1   diazepam (VALIUM) 2 MG tablet, SMARTSIG:1-2 Tablet(s) By Mouth (Patient not taking: Reported on 10/04/2023), Disp: , Rfl:    estradiol (ESTRACE) 0.1 MG/GM vaginal cream, SMARTSIG:2 Gram(s) Vaginal Twice a Week (Patient not taking: Reported  on 10/04/2023), Disp: , Rfl:    hydrocortisone (ANUSOL-HC) 2.5 % rectal cream, Place 1 Application rectally 2 (two) times daily. (Patient not taking: Reported on 10/04/2023), Disp: 30 g, Rfl: 1   triamcinolone cream (KENALOG) 0.1 %, Apply 1 Application topically 2 (two) times daily. (Patient not taking: Reported on 10/04/2023), Disp: 30 g, Rfl: 0  Allergies  Allergen Reactions   Penicillins Hives    I personally reviewed active problem list, medication list, allergies, family history, social history, health maintenance with the patient/caregiver today.   ROS  Constitutional: Negative for fever , positive for weight change.  Respiratory: Negative for cough and shortness of breath.   Cardiovascular: Negative for chest pain or palpitations.  Gastrointestinal: Negative for abdominal pain, no bowel changes.  Musculoskeletal: positive for gait problems  - wearing ortho boot left foot Skin: Negative for rash.  Neurological: Negative for dizziness or headache.  No other specific complaints in a complete review of systems (except as listed in HPI above).   Objective  Vitals:   10/04/23 1046  BP: 128/80  Pulse: 87  Resp: 16  Temp: 98.2 F (36.8 C)  TempSrc: Oral  SpO2: 95%  Weight: 176 lb 1.6 oz (79.9 kg)  Height: 5\' 2"  (1.575 m)     Body mass index is 32.21 kg/m.  Physical Exam  Constitutional: Patient appears well-developed and well-nourished. Obese  No distress.  HEENT: head atraumatic, normocephalic, pupils equal and reactive to light, neck supple Cardiovascular: Normal rate, regular rhythm and normal heart sounds.  No murmur heard. No BLE edema. Pulmonary/Chest: Effort normal and breath sounds normal. No respiratory distress. Abdominal: Soft.  There is no tenderness. Psychiatric: Patient has a normal mood and affect. behavior is normal. Judgment and thought content normal.   PHQ2/9:    10/04/2023   10:49 AM 09/16/2023    9:11 AM 04/12/2023    2:01 PM 02/15/2023    8:49 AM 11/25/2022    8:57 AM  Depression screen PHQ 2/9  Decreased Interest 2 2 2 1  0  Down, Depressed, Hopeless 1 2 2 1  0  PHQ - 2 Score 3 4 4 2  0  Altered sleeping 2 1 1 1  0  Tired, decreased energy 1 0 0 1 0  Change in appetite 2 0 0 0 0  Feeling bad or failure about yourself  1 1 1  0 0  Trouble concentrating 0 0 0 1 0  Moving slowly or fidgety/restless 0 0 0 0 0  Suicidal thoughts 0 0 0 0 0  PHQ-9 Score 9 6 6 5  0  Difficult doing work/chores Somewhat difficult Somewhat difficult  Somewhat difficult     phq 9 is positive   Fall Risk:    10/04/2023   10:49 AM 09/16/2023    9:02 AM 04/12/2023    2:01 PM 02/15/2023    8:48 AM 11/25/2022    8:57 AM  Fall Risk   Falls in the past year? 1 1 1 1  0  Number falls in past yr: 0 0 0 1   Injury with Fall? 1 1 1 1    Comment  left ankle broken     Risk for fall due to : History of fall(s);Orthopedic patient Impaired balance/gait;Impaired mobility No Fall Risks Impaired balance/gait No Fall Risks  Follow up Education provided;Falls prevention discussed;Falls evaluation completed Education provided;Falls prevention discussed Falls prevention discussed Falls prevention discussed;Education provided;Falls evaluation completed Falls prevention discussed;Education provided;Falls evaluation  completed      Functional Status Survey: Is  the patient deaf or have difficulty hearing?: No Does the patient have difficulty seeing, even when wearing glasses/contacts?: No Does the patient have difficulty concentrating, remembering, or making decisions?: Yes Does the patient have difficulty walking or climbing stairs?: Yes Does the patient have difficulty dressing or bathing?: No Does the patient have difficulty doing errands alone such as visiting a doctor's office or shopping?: No    Assessment & Plan  1. MS (multiple sclerosis) (HCC)  Under the care of neurologist   2. Other neutropenia (HCC)  Stable  3. Chronic idiopathic constipation  Under the care of GI  4. Mild episode of recurrent major depressive disorder (HCC)  Still depressed , only taking prn trazodone for sleep  She is getting frustrated with weight gain   5. Migraine without aura and without status migrainosus, not intractable  - Ubrogepant (UBRELVY) 100 MG TABS; Take 1 tablet (100 mg total) by mouth daily as needed. For migraine headaches  Dispense: 16 tablet; Refill: 1  6. Chronic bilateral low back pain with left-sided sciatica  - Ambulatory referral to Plastic Surgery We will place a referral to massage therapy   7. Obesity (BMI 30-39.9)  Insurance does not seem to cover weight loss medications, advised her to contact them early January to find out if changes on formulary   8. Hypertension, benign  - amLODipine (NORVASC) 2.5 MG tablet; Take 1 tablet (2.5 mg total) by mouth daily.  Dispense: 90 tablet; Refill: 1  9. Pre-diabetes  - POCT HgB A1C We will try Metformin 750 mg daily   10. Intertrigo  - Ambulatory referral to Plastic Surgery - clotrimazole (ANTIFUNGAL, CLOTRIMAZOLE,) 1 % cream; Apply 1 Application topically 2 (two) times daily.  Dispense: 113 g; Refill: 0

## 2023-10-04 ENCOUNTER — Encounter: Payer: Self-pay | Admitting: Family Medicine

## 2023-10-04 ENCOUNTER — Ambulatory Visit (INDEPENDENT_AMBULATORY_CARE_PROVIDER_SITE_OTHER): Payer: 59 | Admitting: Family Medicine

## 2023-10-04 VITALS — BP 128/80 | HR 87 | Temp 98.2°F | Resp 16 | Ht 62.0 in | Wt 176.1 lb

## 2023-10-04 DIAGNOSIS — R7303 Prediabetes: Secondary | ICD-10-CM | POA: Diagnosis not present

## 2023-10-04 DIAGNOSIS — D708 Other neutropenia: Secondary | ICD-10-CM | POA: Diagnosis not present

## 2023-10-04 DIAGNOSIS — G43009 Migraine without aura, not intractable, without status migrainosus: Secondary | ICD-10-CM

## 2023-10-04 DIAGNOSIS — L304 Erythema intertrigo: Secondary | ICD-10-CM | POA: Diagnosis not present

## 2023-10-04 DIAGNOSIS — G8929 Other chronic pain: Secondary | ICD-10-CM

## 2023-10-04 DIAGNOSIS — M5442 Lumbago with sciatica, left side: Secondary | ICD-10-CM

## 2023-10-04 DIAGNOSIS — I10 Essential (primary) hypertension: Secondary | ICD-10-CM | POA: Diagnosis not present

## 2023-10-04 DIAGNOSIS — K5904 Chronic idiopathic constipation: Secondary | ICD-10-CM | POA: Diagnosis not present

## 2023-10-04 DIAGNOSIS — G35 Multiple sclerosis: Secondary | ICD-10-CM | POA: Diagnosis not present

## 2023-10-04 DIAGNOSIS — F330 Major depressive disorder, recurrent, mild: Secondary | ICD-10-CM

## 2023-10-04 DIAGNOSIS — E669 Obesity, unspecified: Secondary | ICD-10-CM

## 2023-10-04 LAB — POCT GLYCOSYLATED HEMOGLOBIN (HGB A1C): Hemoglobin A1C: 6.1 % — AB (ref 4.0–5.6)

## 2023-10-04 MED ORDER — CLOTRIMAZOLE 1 % EX CREA: 1.0000 | TOPICAL_CREAM | Freq: Two times a day (BID) | CUTANEOUS | 0 refills | Status: DC

## 2023-10-04 MED ORDER — AMLODIPINE BESYLATE 2.5 MG PO TABS: 2.5000 mg | ORAL_TABLET | Freq: Every day | ORAL | 1 refills | Status: DC

## 2023-10-04 MED ORDER — UBRELVY 100 MG PO TABS: 1.0000 | ORAL_TABLET | Freq: Every day | ORAL | 1 refills | Status: AC | PRN

## 2023-10-04 MED ORDER — METFORMIN HCL ER 750 MG PO TB24: 750.0000 mg | ORAL_TABLET | Freq: Every day | ORAL | 1 refills | Status: DC

## 2023-10-05 ENCOUNTER — Encounter: Payer: Self-pay | Admitting: Family Medicine

## 2023-10-05 ENCOUNTER — Other Ambulatory Visit: Payer: Self-pay | Admitting: Family Medicine

## 2023-10-05 DIAGNOSIS — R7303 Prediabetes: Secondary | ICD-10-CM

## 2023-10-05 DIAGNOSIS — E669 Obesity, unspecified: Secondary | ICD-10-CM

## 2023-10-05 MED ORDER — SEMAGLUTIDE-WEIGHT MANAGEMENT 1 MG/0.5ML ~~LOC~~ SOAJ: 1.0000 mg | SUBCUTANEOUS | 0 refills | Status: AC

## 2023-10-05 MED ORDER — SEMAGLUTIDE-WEIGHT MANAGEMENT 0.25 MG/0.5ML ~~LOC~~ SOAJ: 0.2500 mg | SUBCUTANEOUS | 0 refills | Status: AC

## 2023-10-05 MED ORDER — SEMAGLUTIDE-WEIGHT MANAGEMENT 0.5 MG/0.5ML ~~LOC~~ SOAJ: 0.5000 mg | SUBCUTANEOUS | 0 refills | Status: AC

## 2023-10-06 ENCOUNTER — Telehealth: Payer: Self-pay | Admitting: Family Medicine

## 2023-10-06 ENCOUNTER — Other Ambulatory Visit: Payer: Self-pay | Admitting: Family Medicine

## 2023-10-06 ENCOUNTER — Encounter: Payer: Self-pay | Admitting: Family Medicine

## 2023-10-06 DIAGNOSIS — E669 Obesity, unspecified: Secondary | ICD-10-CM

## 2023-10-06 DIAGNOSIS — G35 Multiple sclerosis: Secondary | ICD-10-CM | POA: Diagnosis not present

## 2023-10-06 DIAGNOSIS — S92909K Unspecified fracture of unspecified foot, subsequent encounter for fracture with nonunion: Secondary | ICD-10-CM | POA: Diagnosis not present

## 2023-10-06 DIAGNOSIS — S82899K Other fracture of unspecified lower leg, subsequent encounter for closed fracture with nonunion: Secondary | ICD-10-CM | POA: Diagnosis not present

## 2023-10-06 DIAGNOSIS — M25572 Pain in left ankle and joints of left foot: Secondary | ICD-10-CM | POA: Diagnosis not present

## 2023-10-06 DIAGNOSIS — G5782 Other specified mononeuropathies of left lower limb: Secondary | ICD-10-CM | POA: Diagnosis not present

## 2023-10-06 NOTE — Telephone Encounter (Signed)
PA initiated

## 2023-10-06 NOTE — Telephone Encounter (Signed)
Copied from CRM [id number]. Topic: General - Other >> Oct 06, 2023 10:15 AM Phill Myron wrote: AMB REFERRAL TO PLASTIC SURGERY Office of [place] [place]. Does not accept medicare or medicaid.Marland KitchenMarland Kitchen

## 2023-10-07 ENCOUNTER — Other Ambulatory Visit: Payer: Self-pay | Admitting: Family Medicine

## 2023-10-07 DIAGNOSIS — E669 Obesity, unspecified: Secondary | ICD-10-CM

## 2023-10-07 NOTE — Telephone Encounter (Signed)
Patient made aware.

## 2023-10-29 ENCOUNTER — Other Ambulatory Visit: Payer: Self-pay | Admitting: Family Medicine

## 2023-11-08 DIAGNOSIS — G8929 Other chronic pain: Secondary | ICD-10-CM | POA: Diagnosis not present

## 2023-11-08 DIAGNOSIS — M25572 Pain in left ankle and joints of left foot: Secondary | ICD-10-CM | POA: Diagnosis not present

## 2023-11-12 DIAGNOSIS — G35 Multiple sclerosis: Secondary | ICD-10-CM | POA: Diagnosis not present

## 2023-12-15 ENCOUNTER — Encounter: Payer: Self-pay | Admitting: Family Medicine

## 2023-12-15 DIAGNOSIS — L304 Erythema intertrigo: Secondary | ICD-10-CM

## 2023-12-15 DIAGNOSIS — G8929 Other chronic pain: Secondary | ICD-10-CM

## 2024-01-07 ENCOUNTER — Other Ambulatory Visit: Payer: Self-pay | Admitting: Family Medicine

## 2024-01-07 DIAGNOSIS — I10 Essential (primary) hypertension: Secondary | ICD-10-CM

## 2024-01-07 DIAGNOSIS — R7303 Prediabetes: Secondary | ICD-10-CM

## 2024-01-09 NOTE — Telephone Encounter (Signed)
 Reordered 10/04/23 #90 1 RF too soon  Requested Prescriptions  Refused Prescriptions Disp Refills   metFORMIN  (GLUCOPHAGE -XR) 750 MG 24 hr tablet [Pharmacy Med Name: METFORMIN  HCL ER 750 MG TABLET] 90 tablet 1    Sig: TAKE 1 TABLET BY MOUTH EVERY DAY WITH BREAKFAST     Endocrinology:  Diabetes - Biguanides Failed - 01/09/2024  1:53 PM      Failed - B12 Level in normal range and within 720 days    No results found for: "VITAMINB12"       Passed - Cr in normal range and within 360 days    Creat  Date Value Ref Range Status  04/12/2023 0.75 0.50 - 1.03 mg/dL Final         Passed - HBA1C is between 0 and 7.9 and within 180 days    Hemoglobin A1C  Date Value Ref Range Status  10/04/2023 6.1 (A) 4.0 - 5.6 % Final  11/07/2022 5.3  Final   Hgb A1c MFr Bld  Date Value Ref Range Status  04/12/2023 5.7 (H) <5.7 % of total Hgb Final    Comment:    For someone without known diabetes, a hemoglobin  A1c value between 5.7% and 6.4% is consistent with prediabetes and should be confirmed with a  follow-up test. . For someone with known diabetes, a value <7% indicates that their diabetes is well controlled. A1c targets should be individualized based on duration of diabetes, age, comorbid conditions, and other considerations. . This assay result is consistent with an increased risk of diabetes. . Currently, no consensus exists regarding use of hemoglobin A1c for diagnosis of diabetes for children. .          Passed - eGFR in normal range and within 360 days    GFR, Est African American  Date Value Ref Range Status  10/15/2020 123 > OR = 60 mL/min/1.19m2 Final   GFR, Est Non African American  Date Value Ref Range Status  10/15/2020 106 > OR = 60 mL/min/1.75m2 Final   eGFR  Date Value Ref Range Status  04/12/2023 97 > OR = 60 mL/min/1.29m2 Final         Passed - Valid encounter within last 6 months    Recent Outpatient Visits           3 months ago MS (multiple sclerosis)  (HCC)   [place] [facility] Arleen Lacer, MD   9 months ago MS (multiple sclerosis) [facility])   [place] [facility] Arleen Lacer, MD   10 months ago Dermatitis   [facility] Mecum, Pearla Bottom, PA-C   1 year ago Muscular abdominal pain in left upper quadrant   [facility] Sowles, Krichna, MD   1 year ago Well adult exam   [facility] Sowles, Krichna, MD       Future Appointments             In 2 months Sowles, Krichna, MD [facility]            Passed - CBC within normal limits and completed in the last 12 months    WBC  Date Value Ref Range Status  04/12/2023 3.2 (L) 3.8 - 10.8 Thousand/uL Final   RBC  Date Value Ref Range Status  04/12/2023 4.80 3.80 - 5.10 Million/uL Final   Hemoglobin  Date Value Ref Range Status  04/12/2023 13.2 11.7 - 15.5 g/dL Final  HCT  Date Value Ref Range Status  04/12/2023 39.7 35.0 - 45.0 % Final   MCHC  Date Value Ref Range Status  04/12/2023 33.2 32.0 - 36.0 g/dL Final   [facility]  Date Value Ref Range Status  04/12/2023 27.5 27.0 - 33.0 pg Final   MCV  Date Value Ref Range Status  04/12/2023 82.7 80.0 - 100.0 fL Final   No results found for: "PLTCOUNTKUC", "LABPLAT", "POCPLA" RDW  Date Value Ref Range Status  04/12/2023 12.7 11.0 - 15.0 % Final          amLODipine  (NORVASC ) 2.5 MG tablet [Pharmacy Med Name: AMLODIPINE  BESYLATE 2.5 MG TAB] 90 tablet 1    Sig: TAKE 1 TABLET BY MOUTH EVERY DAY     Cardiovascular: Calcium Channel Blockers 2 Passed - 01/09/2024  1:53 PM      Passed - Last BP in normal range    BP Readings from Last 1 Encounters:  10/04/23 128/80         Passed - Last Heart Rate in normal range    Pulse Readings from Last 1 Encounters:  10/04/23 87         Passed - Valid encounter within last 6 months    Recent Outpatient Visits           3 months ago MS  (multiple sclerosis) (HCC)   [place] [facility] Arleen Lacer, MD   9 months ago MS (multiple sclerosis) [facility])   [place] [facility] Arleen Lacer, MD   10 months ago Dermatitis   [facility] Mecum, Pearla Bottom, PA-C   1 year ago Muscular abdominal pain in left upper quadrant   [facility] Arleen Lacer, MD   1 year ago Well adult exam   [facility] Arleen Lacer, MD       Future Appointments             In 2 months Ava Lei, Krichna, MD [facility], [facility]

## 2024-01-24 DIAGNOSIS — M5417 Radiculopathy, lumbosacral region: Secondary | ICD-10-CM | POA: Diagnosis not present

## 2024-01-24 DIAGNOSIS — M9904 Segmental and somatic dysfunction of sacral region: Secondary | ICD-10-CM | POA: Diagnosis not present

## 2024-01-24 DIAGNOSIS — M5432 Sciatica, left side: Secondary | ICD-10-CM | POA: Diagnosis not present

## 2024-01-24 DIAGNOSIS — M9903 Segmental and somatic dysfunction of lumbar region: Secondary | ICD-10-CM | POA: Diagnosis not present

## 2024-01-26 DIAGNOSIS — M9903 Segmental and somatic dysfunction of lumbar region: Secondary | ICD-10-CM | POA: Diagnosis not present

## 2024-01-26 DIAGNOSIS — M5432 Sciatica, left side: Secondary | ICD-10-CM | POA: Diagnosis not present

## 2024-01-26 DIAGNOSIS — M9904 Segmental and somatic dysfunction of sacral region: Secondary | ICD-10-CM | POA: Diagnosis not present

## 2024-01-26 DIAGNOSIS — M5417 Radiculopathy, lumbosacral region: Secondary | ICD-10-CM | POA: Diagnosis not present

## 2024-02-17 ENCOUNTER — Telehealth: Payer: Self-pay | Admitting: Gastroenterology

## 2024-02-17 NOTE — Telephone Encounter (Signed)
 The patient called to schedule a follow-up appointment with Dr. Servando Snare, as she is still experiencing bloating on the left side and irregular bowel movements. She mentioned that even taking a sip of water makes her feel full. The patient also noted that Dr. Servando Snare prescribed medication for her last year, but it was not effective.

## 2024-02-21 ENCOUNTER — Encounter: Payer: Self-pay | Admitting: Gastroenterology

## 2024-02-21 ENCOUNTER — Ambulatory Visit (INDEPENDENT_AMBULATORY_CARE_PROVIDER_SITE_OTHER): Admitting: Gastroenterology

## 2024-02-21 ENCOUNTER — Institutional Professional Consult (permissible substitution): Payer: 59 | Admitting: Plastic Surgery

## 2024-02-21 VITALS — BP 163/104 | HR 83 | Temp 97.8°F | Ht 62.0 in | Wt 174.5 lb

## 2024-02-21 DIAGNOSIS — K581 Irritable bowel syndrome with constipation: Secondary | ICD-10-CM | POA: Diagnosis not present

## 2024-02-21 NOTE — Patient Instructions (Signed)
 Start a clear liquid diet and do the Miralax prep. At 5:00pm Mix 64 ounces of Gatorade with 238 grams of miralax. Drink 8oz every 20 to 30 minutes till solution is gone.  Gave Ibsrela samples take 1 tablet by mouth twice a day. Let us know how it works and we will call you in a prescription for you.

## 2024-02-21 NOTE — Progress Notes (Signed)
 Arlyss Repress, MD [address]  [address]  [place], [place] [address]  Main: [phone]  Fax: [phone]    Gastroenterology Consultation  Referring Provider:     Alba Cory, MD Primary Care Physician:  Alba Cory, MD Primary Gastroenterologist:  Dr. Arlyss Repress Reason for Consultation: Left-sided abdominal pain, abdominal bloating, chronic constipation        HPI:   Tammie Lopez is a 53 y.o. female referred by Dr. Alba Cory, MD  for consultation & management of 2 years history of left-sided abdominal pain and worsening constipation.  Patient always struggled having irregular bowel events.  She was previously seen by Dr. Servando Snare, tried Linzess 290 mcg which was too strong, interfering during the day, she is a Designer, industrial/product.  She tried 145 mcg which did not help much.  She tried MiraLAX in the past.  She is trying to drink more, incorporating fruits and vegetables.  Also, started walking daily.  She denies any rectal bleeding.  She has bowel movement twice a week with incomplete emptying and significant bloating and generalized abdominal discomfort  NSAIDs: None  Antiplts/Anticoagulants/Anti thrombotics: None  GI Procedures:  Colonoscopy 02/09/2021 - One 2 mm polyp in the descending colon, removed with a cold biopsy forceps. Resected and retrieved. - Non- bleeding internal hemorrhoids.  DIAGNOSIS:  A. COLON POLYP, DESCENDING; COLD BIOPSY:  - TUBULAR ADENOMA.  - NEGATIVE FOR HIGH-GRADE DYSPLASIA AND MALIGNANCY.   Past Medical History:  Diagnosis Date   Allergy    Constipation    Diverticula of colon    Family history of adverse reaction to anesthesia    mother's BP increased   Hypertension    Insomnia    Migraine    migraines   MS (multiple sclerosis) (HCC)    Muscle spasm     Past Surgical History:  Procedure Laterality Date   BREAST BIOPSY Left 01/06/2018    2 areas  neg   BUNIONECTOMY     CESAREAN SECTION N/A 2006   COLONOSCOPY  WITH PROPOFOL N/A 02/09/2021   Procedure: COLONOSCOPY WITH PROPOFOL;  Surgeon: Midge Minium, MD;  Location: [facility];  Service: Endoscopy;  Laterality: N/A;   GANGLION CYST EXCISION Bilateral    Hands   POLYPECTOMY N/A 02/09/2021   Procedure: POLYPECTOMY;  Surgeon: Midge Minium, MD;  Location: [facility];  Service: Endoscopy;  Laterality: N/A;     Current Outpatient Medications:    amLODipine (NORVASC) 2.5 MG tablet, Take 1 tablet (2.5 mg total) by mouth daily., Disp: 90 tablet, Rfl: 1   Cholecalciferol (VITAMIN D) 50 MCG (2000 UT) CAPS, Take 2,000 Units by mouth daily., Disp: , Rfl:    Dimethyl Fumarate (TECFIDERA) 240 MG CPDR, Take 1 capsule by mouth in the morning and at bedtime., Disp: , Rfl:    omeprazole (PRILOSEC) 40 MG capsule, TAKE 1 CAPSULE BY MOUTH EVERY DAY, Disp: 90 capsule, Rfl: 1   traZODone (DESYREL) 50 MG tablet, TAKE 1/2 TO 1 TABLET BY MOUTH AT BEDTIME AS NEEDED FOR SLEEP, Disp: 90 tablet, Rfl: 0   Ubrogepant (UBRELVY) 100 MG TABS, Take 1 tablet (100 mg total) by mouth daily as needed. For migraine headaches, Disp: 16 tablet, Rfl: 1   Family History  Problem Relation Age of Onset   Diabetes Mother    Hypertension Mother    Cancer Father 84       Mouth   COPD Father    Hypertension Brother    Diabetes Brother  Breast cancer Neg Hx      Social History   Tobacco Use   Smoking status: Never   Smokeless tobacco: Never   Tobacco comments:    smoking cessation materials not required  Vaping Use   Vaping status: Never Used  Substance Use Topics   Alcohol use: No   Drug use: No    Allergies as of 02/21/2024 - Review Complete 02/21/2024  Allergen Reaction Noted   Penicillins Hives 08/26/2015    Review of Systems:    All systems reviewed and negative except where noted in HPI.   Physical Exam:  BP (!) 163/104 (BP Location: Left Arm, Patient Position: Sitting, Cuff Size: Normal)   Pulse 83   Temp 97.8 F (36.6 C) (Oral)   Ht 5\' 2"   (1.575 m)   Wt 174 lb 8 oz (79.2 kg)   LMP 03/16/2022   BMI 31.92 kg/m  Patient's last menstrual period was 03/16/2022.  General:   Alert,  Well-developed, well-nourished, pleasant and cooperative in NAD Head:  Normocephalic and atraumatic. Eyes:  Sclera clear, no icterus.   Conjunctiva pink. Ears:  Normal auditory acuity. Nose:  No deformity, discharge, or lesions. Mouth:  No deformity or lesions,oropharynx pink & moist. Neck:  Supple; no masses or thyromegaly. Lungs:  Respirations even and unlabored.  Clear throughout to auscultation.   No wheezes, crackles, or rhonchi. No acute distress. Heart:  Regular rate and rhythm; no murmurs, clicks, rubs, or gallops. Abdomen:  Normal bowel sounds. Soft, non-tender and non-distended without masses, hepatosplenomegaly or hernias noted.  No guarding or rebound tenderness.   Rectal: Not performed Msk:  Symmetrical without gross deformities. Good, equal movement & strength bilaterally. Pulses:  Normal pulses noted. Extremities:  No clubbing or edema.  No cyanosis. Neurologic:  Alert and oriented x3;  grossly normal neurologically. Skin:  Intact without significant lesions or rashes. No jaundice. Psych:  Alert and cooperative. Normal mood and affect.  Imaging Studies: Reviewed  Assessment and Plan:   Tammie Lopez is a 53 y.o. pleasant female with obesity, hypertension is seen in consultation for chronic constipation, with left sided abdominal pain, abdominal bloating  IBS-constipation Will try IBS Rela, samples provided Discussed in length regarding dietary management, increased physical activity Recommend cleanout with MiraLAX prep, instructions provided   Follow up in 6 months, contact via MyChart as needed   Arlyss Repress, MD

## 2024-03-28 ENCOUNTER — Ambulatory Visit
Admission: EM | Admit: 2024-03-28 | Discharge: 2024-03-28 | Disposition: A | Discharge status: Home / Self Care | Attending: Family Medicine | Admitting: Family Medicine

## 2024-03-28 ENCOUNTER — Ambulatory Visit (INDEPENDENT_AMBULATORY_CARE_PROVIDER_SITE_OTHER)

## 2024-03-28 DIAGNOSIS — M6788 Other specified disorders of synovium and tendon, other site: Secondary | ICD-10-CM

## 2024-03-28 DIAGNOSIS — M25571 Pain in right ankle and joints of right foot: Secondary | ICD-10-CM

## 2024-03-28 MED ORDER — NAPROXEN 500 MG PO TABS
500.0000 mg | ORAL_TABLET | Freq: Two times a day (BID) | ORAL | 0 refills | Status: DC
Start: 2024-03-28 — End: 2024-06-12

## 2024-03-28 NOTE — ED Provider Notes (Signed)
 [facility]    CSN: [id number] Arrival date & time: 03/28/24  4098      History   Chief Complaint Chief Complaint  Patient presents with   Ankle Pain    HPI  HPI Tammie Lopez is a 53 y.o. female.   Tammie Lopez presents for right ankle pain that radiates to the heel for the past 2-3 weeks. Follows with Dr. Andrez Keel and Dr. Althea Atkinson but they were not able to see her today.  Taking Aleve  and applying ice.  Previously had a fracture of her left foot and states that she was told she had a "hairline fracture" in her left foot. She is concerned the "hairline fracture" in her foot is worse now.       Past Medical History:  Diagnosis Date   Allergy    Constipation    Diverticula of colon    Family history of adverse reaction to anesthesia    mother's BP increased   Hypertension    Insomnia    Migraine    migraines   MS (multiple sclerosis) (HCC)    Muscle spasm     Patient Active Problem List   Diagnosis Date Noted   Chronic bilateral low back pain with left-sided sciatica 11/25/2022   Muscular abdominal pain in left upper quadrant 11/25/2022   Intertrigo 11/25/2022   History of colonic polyps    Polyp of descending colon    Eczema 01/20/2021   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 tuberculosis 07/16/2020   Low TSH level 09/19/2019   Obesity (BMI 30-39.9) 12/06/2017   Insulin  resistance 09/27/2017   Essential hypertension 09/21/2017   Right sided sciatica 09/21/2017   Major depression, recurrent (HCC) 05/24/2017   Migraine without aura, not intractable 12/09/2016   Nickel allergy 12/09/2016   GERD without esophagitis 12/09/2016   Other neutropenia (HCC) 12/09/2016   Dyslipidemia 12/09/2016   Diverticulosis 12/09/2016   Allergic rhinitis, seasonal 12/09/2016   Chronic constipation 12/09/2016   Vitamin D  deficiency 08/06/2013   MS (multiple sclerosis) (HCC) 04/17/2013    Past Surgical History:  Procedure Laterality Date    BREAST BIOPSY Left 01/06/2018    2 areas  neg   BUNIONECTOMY     CESAREAN SECTION N/A 2006   COLONOSCOPY WITH PROPOFOL  N/A 02/09/2021   Procedure: COLONOSCOPY WITH PROPOFOL ;  Surgeon: Marnee Sink, MD;  Location: [facility];  Service: Endoscopy;  Laterality: N/A;   GANGLION CYST EXCISION Bilateral    Hands   POLYPECTOMY N/A 02/09/2021   Procedure: POLYPECTOMY;  Surgeon: Marnee Sink, MD;  Location: [facility];  Service: Endoscopy;  Laterality: N/A;    OB History   No obstetric history on file.      Home Medications    Prior to Admission medications   Medication Sig Start Date End Date Taking? Authorizing Provider  amLODipine  (NORVASC ) 2.5 MG tablet Take 1 tablet (2.5 mg total) by mouth daily. 10/04/23  Yes Sowles, Krichna, MD  Cholecalciferol (VITAMIN D ) 50 MCG (2000 UT) CAPS Take 2,000 Units by mouth daily.   Yes [provider]  Dimethyl Fumarate  (TECFIDERA ) 240 MG CPDR Take 1 capsule by mouth in the morning and at bedtime.   Yes [employer]  naproxen  (NAPROSYN ) 500 MG tablet Take 1 tablet (500 mg total) by mouth 2 (two) times daily with a meal. 03/28/24  Yes Doloris Servantes, DO  omeprazole  (PRILOSEC) 40 MG capsule TAKE 1 CAPSULE BY MOUTH EVERY DAY 12/17/21  Yes Sowles,  Krichna, MD  traZODone  (DESYREL ) 50 MG tablet TAKE 1/2 TO 1 TABLET BY MOUTH AT BEDTIME AS NEEDED FOR SLEEP 03/01/22  Yes Sowles, Krichna, MD  Ubrogepant  (UBRELVY ) 100 MG TABS Take 1 tablet (100 mg total) by mouth daily as needed. For migraine headaches 10/04/23   Sowles, Krichna, MD    Family History Family History  Problem Relation Age of Onset   Diabetes Mother    Hypertension Mother    Cancer Father 20       Mouth   COPD Father    Hypertension Brother    Diabetes Brother    Breast cancer Neg Hx     Social History Social History   Tobacco Use   Smoking status: Never   Smokeless tobacco: Never   Tobacco comments:    smoking cessation materials not required   Vaping Use   Vaping status: Never Used  Substance Use Topics   Alcohol use: No   Drug use: No     Allergies   Penicillins   Review of Systems Review of Systems: :negative unless otherwise stated in HPI.      Physical Exam Triage Vital Signs ED Triage Vitals  Encounter Vitals Group     BP 03/28/24 1029 (!) 155/98     Systolic BP Percentile --      Diastolic BP Percentile --      Pulse Rate 03/28/24 1029 83     Resp 03/28/24 1029 14     Temp 03/28/24 1029 97.9 F (36.6 C)     Temp Source 03/28/24 1029 Oral     SpO2 03/28/24 1029 98 %     Weight --      Height --      Head Circumference --      Peak Flow --      Pain Score 03/28/24 1031 5     Pain Loc --      Pain Education --      Exclude from Growth Chart --    No data found.  Updated Vital Signs BP (!) 155/98 (BP Location: Left Arm)   Pulse 83   Temp 97.9 F (36.6 C) (Oral)   Resp 14   LMP 03/16/2022   SpO2 98%   Visual Acuity Right Eye Distance:   Left Eye Distance:   Bilateral Distance:    Right Eye Near:   Left Eye Near:    Bilateral Near:     Physical Exam GEN: well appearing female in no acute distress  CVS: well perfused  RESP: speaking in full sentences without pause, no respiratory distress  MSK:  Ankle/Foot, Right: Tenderness at the insertion/body/myotendinous junction of the Achilles tendon; TTP noted at the lateral malleolus. No visible erythema, swelling, ecchymosis, or bony deformity. No evidence of tibiotalar deviation; Range of motion is full in all directions. Strength is 5/5 in all directions.  No tenderness on posterior aspects of lateral malleolus; pain elicited with plantarflexion, unremarkable squeeze; Talar dome non-tender; No plantar calcaneal tenderness; No tenderness over the navicular prominence or  over cuboid; No pain at base of 5th MT; No tenderness at the distal metatarsals; Able to walk 4 steps but with pain.     UC Treatments / Results  Labs (all labs ordered are  listed, but only abnormal results are displayed) Labs Reviewed - No data to display  EKG   Radiology DG Ankle Complete Right Result Date: 03/28/2024 CLINICAL DATA:  Posterior and lateral right ankle and heel pain for the past  2-3 weeks. No recent injuries patient reports falling 1 year ago, breaking both ankles. EXAM: RIGHT ANKLE - COMPLETE 3+ VIEW COMPARISON:  01/23/2023 and right foot radiographs obtained today. FINDINGS: Mild flattening of the normal plantar arch. Minimal posterior and inferior calcaneal enthesophyte formation. No fracture, dislocation or effusion. IMPRESSION: 1. No acute abnormality. 2. Mild flattening of the normal plantar arch. 3. Minimal posterior and inferior calcaneal enthesophyte formation. Electronically Signed   By: Catherin Closs M.D.   On: 03/28/2024 11:33   DG Foot Complete Right Result Date: 03/28/2024 CLINICAL DATA:  Posterior right ankle and heel pain for the past 2-3 weeks. No recent injury. The patient fell 1 year ago and fractured both ankles EXAM: RIGHT FOOT COMPLETE - 3+ VIEW COMPARISON:  Right ankle radiographs obtained at the same time. FINDINGS: There is mild flattening of the normal plantar arch. Fracture or dislocation. Minimal inferior and posterior calcaneal enthesophyte formation IMPRESSION: 1. No fracture. 2. Mild pes planus. Electronically Signed   By: Catherin Closs M.D.   On: 03/28/2024 11:30     Procedures Procedures (including critical care time)  Medications Ordered in UC Medications - No data to display  Initial Impression / Assessment and Plan / UC Course  I have reviewed the triage vital signs and the nursing notes.  Pertinent labs & imaging results that were available during my care of the patient were reviewed by me and considered in my medical decision making (see chart for details).      Pt is a 53 y.o.  female with 2 to 3 weeks of right foot and ankle pain. Offered p.o. and IM pain control however patient declined this here.   On  exam, pt has tenderness at Achilles tuberosity and lateral malleolus.  Obtained right foot and ankle plain films.  Personally interpreted by me were unremarkable for fracture or dislocation. Radiologist report reviewed and additionally notes minimal inferior and posterior calcaneal enthesophyte formation and mild pes planus.  No soft tissue swelling.   CAM boot provided prior to discharge suspect Achilles tendinitis.  Patient declined crutches.  Patient to gradually return to normal activities, as tolerated and continue ordinary activities within the limits permitted by pain. Prescribed Naproxen  sodium for pain relief.  Tylenol  PRN. Advised patient to avoid OTC NSAIDs while taking prescription NSAID.  Work note provided.  Patient to follow up her orthopedic providers, if symptoms do not improve with conservative treatment.  Return and ED precautions given. Understanding voiced. Discussed MDM, treatment plan and plan for follow-up with patient who agrees with plan.   Final Clinical Impressions(s) / UC Diagnoses   Final diagnoses:  Pain in joint involving right ankle and foot  Achilles tendonosis     Discharge Instructions      If medication was prescribed, stop by the pharmacy to pick up your prescriptions.  For your  pain, Take 1500 mg Tylenol  twice a day, with Naprosyn  twice a day,  as needed for pain.  Rest and elevate the affected painful area.  Apply warm compresses intermittently, as needed.  As pain recedes, begin normal activities slowly as tolerated.  Follow up with  an orthopedic provider, if symptoms persist.  Watch for worsening symptoms such as an increasing weakness or loss of sensation, increasing pain and/or the loss of bladder or bowel function. Should any of these occur, go to the emergency department immediately.        ED Prescriptions     Medication Sig Dispense Auth. Provider   naproxen  (  NAPROSYN ) 500 MG tablet Take 1 tablet (500 mg total) by mouth 2 (two) times  daily with a meal. 30 tablet Clint Biello, DO      PDMP not reviewed this encounter.   Yigit Norkus, DO 03/30/24 1050

## 2024-03-28 NOTE — Discharge Instructions (Addendum)
 If medication was prescribed, stop by the pharmacy to pick up your prescriptions.  For your  pain, Take 1500 mg Tylenol  twice a day, with Naprosyn  twice a day,  as needed for pain.  Rest and elevate the affected painful area.  Apply warm compresses intermittently, as needed.  As pain recedes, begin normal activities slowly as tolerated.  Follow up with  an orthopedic provider, if symptoms persist.  Watch for worsening symptoms such as an increasing weakness or loss of sensation, increasing pain and/or the loss of bladder or bowel function. Should any of these occur, go to the emergency department immediately.

## 2024-03-28 NOTE — ED Triage Notes (Signed)
 Pt is having pain in her right ankle and heel x 2-3 weeks. No recent injuries or falls.

## 2024-04-04 ENCOUNTER — Ambulatory Visit: Payer: Self-pay | Admitting: Family Medicine

## 2024-04-12 ENCOUNTER — Encounter: Payer: Self-pay | Admitting: Gastroenterology

## 2024-04-13 ENCOUNTER — Other Ambulatory Visit: Payer: Self-pay | Admitting: Gastroenterology

## 2024-04-13 MED ORDER — IBSRELA 50 MG PO TABS: 1.0000 | ORAL_TABLET | Freq: Every day | ORAL | 5 refills | Status: DC

## 2024-04-13 NOTE — Addendum Note (Signed)
 Addended by: Lovie Rudder on: 04/13/2024 11:24 AM   Modules accepted: Orders

## 2024-05-18 ENCOUNTER — Institutional Professional Consult (permissible substitution): Admitting: Plastic Surgery

## 2024-05-22 ENCOUNTER — Other Ambulatory Visit: Payer: Self-pay | Admitting: Family Medicine

## 2024-05-22 DIAGNOSIS — I10 Essential (primary) hypertension: Secondary | ICD-10-CM

## 2024-05-22 MED ORDER — AMLODIPINE BESYLATE 2.5 MG PO TABS: 2.5000 mg | ORAL_TABLET | Freq: Every day | ORAL | 0 refills | Status: DC

## 2024-05-22 NOTE — Telephone Encounter (Signed)
 Copied from CRM [id number]. Topic: Clinical - Medication Refill >> May 22, 2024 11:13 AM Myrick T wrote: Medication: amLODipine  (NORVASC ) 2.5 MG tablet   Has the patient contacted their pharmacy? No  This is the patient's preferred pharmacy:  [facility] [address] [place] - [address] [place] [address] Phone: [phone] Fax: [phone]  Is this the correct pharmacy for this prescription? Yes  Has the prescription been filled recently? Yes  Is the patient out of the medication? Yes  Has the patient been seen for an appointment in the last year OR does the patient have an upcoming appointment? Yes  Can we respond through MyChart? No  Agent: Please be advised that Rx refills may take up to 3 business days. We ask that you follow-up with your pharmacy.  Patient has an upcoming appt on 8/9 but need enough medication to last her to that date.

## 2024-05-22 NOTE — Telephone Encounter (Signed)
 Patient called and stated that she is out of Amlodipine , Future OV scheduled 07/17/24 will give courtesy refill until  visit.  Requested Prescriptions  Pending Prescriptions Disp Refills   amLODipine  (NORVASC ) 2.5 MG tablet 90 tablet 0    Sig: Take 1 tablet (2.5 mg total) by mouth daily.     Cardiovascular: Calcium Channel Blockers 2 Failed - 05/22/2024 12:13 PM      Failed - Last BP in normal range    BP Readings from Last 1 Encounters:  03/28/24 (!) 155/98         Failed - Valid encounter within last 6 months    Recent Outpatient Visits   None            Passed - Last Heart Rate in normal range    Pulse Readings from Last 1 Encounters:  03/28/24 83

## 2024-05-22 NOTE — Telephone Encounter (Addendum)
 Attempted to call patient to determine which medication she is requesting for refill,  lvmtcb.   05/22/24 11:37: Call reviewed by TL and amlodipine  was the requested medication. Left second voicemail for patient that return call is no longer needed.

## 2024-05-29 ENCOUNTER — Other Ambulatory Visit: Payer: Self-pay | Admitting: Orthopedic Surgery

## 2024-05-29 ENCOUNTER — Institutional Professional Consult (permissible substitution): Admitting: Plastic Surgery

## 2024-05-29 DIAGNOSIS — M7661 Achilles tendinitis, right leg: Secondary | ICD-10-CM

## 2024-05-31 ENCOUNTER — Ambulatory Visit
Admission: RE | Admit: 2024-05-31 | Discharge: 2024-05-31 | Disposition: A | Discharge status: Home / Self Care | Source: Ambulatory Visit | Attending: Orthopedic Surgery | Admitting: Orthopedic Surgery

## 2024-05-31 DIAGNOSIS — M7661 Achilles tendinitis, right leg: Secondary | ICD-10-CM | POA: Insufficient documentation

## 2024-06-12 ENCOUNTER — Encounter: Payer: Self-pay | Admitting: Family Medicine

## 2024-06-12 ENCOUNTER — Ambulatory Visit (INDEPENDENT_AMBULATORY_CARE_PROVIDER_SITE_OTHER): Admitting: Family Medicine

## 2024-06-12 VITALS — BP 136/86 | HR 94 | Resp 16 | Ht 62.0 in | Wt 175.2 lb

## 2024-06-12 DIAGNOSIS — G35 Multiple sclerosis: Secondary | ICD-10-CM

## 2024-06-12 DIAGNOSIS — G8929 Other chronic pain: Secondary | ICD-10-CM | POA: Diagnosis not present

## 2024-06-12 DIAGNOSIS — K219 Gastro-esophageal reflux disease without esophagitis: Secondary | ICD-10-CM

## 2024-06-12 DIAGNOSIS — E559 Vitamin D deficiency, unspecified: Secondary | ICD-10-CM

## 2024-06-12 DIAGNOSIS — R7303 Prediabetes: Secondary | ICD-10-CM

## 2024-06-12 DIAGNOSIS — R1012 Left upper quadrant pain: Secondary | ICD-10-CM | POA: Diagnosis not present

## 2024-06-12 DIAGNOSIS — G43009 Migraine without aura, not intractable, without status migrainosus: Secondary | ICD-10-CM

## 2024-06-12 DIAGNOSIS — I10 Essential (primary) hypertension: Secondary | ICD-10-CM

## 2024-06-12 DIAGNOSIS — M5442 Lumbago with sciatica, left side: Secondary | ICD-10-CM

## 2024-06-12 DIAGNOSIS — E785 Hyperlipidemia, unspecified: Secondary | ICD-10-CM

## 2024-06-12 DIAGNOSIS — D708 Other neutropenia: Secondary | ICD-10-CM

## 2024-06-12 DIAGNOSIS — R7989 Other specified abnormal findings of blood chemistry: Secondary | ICD-10-CM

## 2024-06-12 MED ORDER — OMEPRAZOLE 40 MG PO CPDR: DELAYED_RELEASE_CAPSULE | ORAL | 1 refills | Status: AC

## 2024-06-12 MED ORDER — AMLODIPINE BESYLATE 2.5 MG PO TABS: 2.5000 mg | ORAL_TABLET | Freq: Every day | ORAL | 1 refills | Status: AC

## 2024-06-12 NOTE — Progress Notes (Signed)
 Name: Tammie Lopez   MRN: [id number]    DOB: 1971/05/19   Date:06/12/2024       Progress Note  Subjective  Chief Complaint  Chief Complaint  Patient presents with   Medical Management of Chronic Issues   Discussed the use of AI scribe software for clinical note transcription with the patient, who gave verbal consent to proceed.  History of Present Illness Tammie Lopez is a 53 year old female with multiple sclerosis and chronic low back pain who presents for a follow-up visit and medication refills.  She experiences persistent low back pain radiating down her left leg to her foot, described as nerve pain. She has previously tried Lyrica  and consulted with Dr. Kathlynn. An MRI of the spine was performed, but she is currently not on any treatment for her back pain. The pain worsens with certain movements and pressure, affecting her daily activities.  She reports left upper quadrant abdominal pain radiating to her thoracic spine, worsening with movement. She has seen a gastroenterologist and was prescribed medication for IBS-related constipation, which her insurance did not cover. She has tried Linzess  and MiraLAX with limited success and is currently managing her symptoms with dietary changes. She experiences constipation with bowel movements occurring twice a week and feels her gut is not emptying properly.  She has multiple sclerosis; recent MRI showed mild progression of brain lesions Current symptoms include fatigue and occasional tingling and numbness.  She is back on Tecfidera  240 mg twice a day, which is working well.  She has hypertension, managed with a 2.5 mg dose of a blood pressure medication, taken daily without issues. Additionally, she experiences migraines, for which she takes Ubrelvy  as needed, approximately once a month. Migraines are associated with light sensitivity and occasional nausea.  She reports not taking trazodone  for sleep due to caregiving responsibilities for  her mother, who has been in and out of the hospital since June.   She occasionally takes Celebrex for inflammation and uses Prilosec for heartburn and indigestion as needed.   Her social history includes being a school bus driver and experiencing stress due to her mother's health issues, impacting her ability to manage her own health concerns.    Patient Active Problem List   Diagnosis Date Noted   Chronic bilateral low back pain with left-sided sciatica 11/25/2022   Muscular abdominal pain in left upper quadrant 11/25/2022   Intertrigo 11/25/2022   History of colonic polyps    Polyp of descending colon    Eczema 01/20/2021   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 tuberculosis 07/16/2020   Low TSH level 09/19/2019   Obesity (BMI 30-39.9) 12/06/2017   Insulin  resistance 09/27/2017   Essential hypertension 09/21/2017   Right sided sciatica 09/21/2017   Major depression, recurrent (HCC) 05/24/2017   Migraine without aura, not intractable 12/09/2016   Nickel allergy 12/09/2016   GERD without esophagitis 12/09/2016   Other neutropenia (HCC) 12/09/2016   Dyslipidemia 12/09/2016   Diverticulosis 12/09/2016   Allergic rhinitis, seasonal 12/09/2016   Chronic constipation 12/09/2016   Vitamin D  deficiency 08/06/2013   MS (multiple sclerosis) (HCC) 04/17/2013    Past Surgical History:  Procedure Laterality Date   BREAST BIOPSY Left 01/06/2018    2 areas  neg   BUNIONECTOMY     CESAREAN SECTION N/A 2006   COLONOSCOPY WITH PROPOFOL  N/A 02/09/2021   Procedure: COLONOSCOPY WITH PROPOFOL ;  Surgeon: Jinny Carmine, MD;  Location: [facility];  Service: Endoscopy;  Laterality: N/A;   GANGLION CYST EXCISION Bilateral    Hands   POLYPECTOMY N/A 02/09/2021   Procedure: POLYPECTOMY;  Surgeon: Jinny Carmine, MD;  Location: [facility];  Service: Endoscopy;  Laterality: N/A;    Family History  Problem Relation Age of Onset   Diabetes Mother     Hypertension Mother    Cancer Father 3       Mouth   COPD Father    Hypertension Brother    Diabetes Brother    Breast cancer Neg Hx     Social History   Tobacco Use   Smoking status: Never   Smokeless tobacco: Never   Tobacco comments:    smoking cessation materials not required  Substance Use Topics   Alcohol use: No     Current Outpatient Medications:    amLODipine  (NORVASC ) 2.5 MG tablet, Take 1 tablet (2.5 mg total) by mouth daily., Disp: 90 tablet, Rfl: 0   celecoxib (CELEBREX) 200 MG capsule, Take 200 mg by mouth daily., Disp: , Rfl:    Cholecalciferol (VITAMIN D ) 50 MCG (2000 UT) CAPS, Take 2,000 Units by mouth daily., Disp: , Rfl:    Dimethyl Fumarate  (TECFIDERA ) 240 MG CPDR, Take 1 capsule by mouth in the morning and at bedtime., Disp: , Rfl:    omeprazole  (PRILOSEC) 40 MG capsule, TAKE 1 CAPSULE BY MOUTH EVERY DAY, Disp: 90 capsule, Rfl: 1   traZODone  (DESYREL ) 50 MG tablet, TAKE 1/2 TO 1 TABLET BY MOUTH AT BEDTIME AS NEEDED FOR SLEEP, Disp: 90 tablet, Rfl: 0   Ubrogepant  (UBRELVY ) 100 MG TABS, Take 1 tablet (100 mg total) by mouth daily as needed. For migraine headaches, Disp: 16 tablet, Rfl: 1   linaclotide  (LINZESS ) 145 MCG CAPS capsule, Take 1 capsule (145 mcg total) by mouth daily before breakfast., Disp: 30 capsule, Rfl: 6   naproxen  (NAPROSYN ) 500 MG tablet, Take 1 tablet (500 mg total) by mouth 2 (two) times daily with a meal., Disp: 30 tablet, Rfl: 0  Allergies  Allergen Reactions   Penicillins Hives    I personally reviewed active problem list, medication list, allergies, family history with the patient/caregiver today.   ROS  Ten systems reviewed and is negative except as mentioned in HPI    Objective Physical Exam VITALS: BP- 136/82 CONSTITUTIONAL: Patient appears well-developed and well-nourished.  No distress. HEENT: Head atraumatic, normocephalic, neck supple. CARDIOVASCULAR: Normal rate, regular rhythm and normal heart sounds.  No murmur  heard. No BLE edema. PULMONARY: Effort normal and breath sounds normal. No respiratory distress. ABDOMINAL: There is left upper quadrant tenderness  MUSCULOSKELETAL: Normal gait.pain on back with palpation of left lower leg PSYCHIATRIC: Patient has a normal mood and affect. behavior is normal. Judgment and thought content normal.  Vitals:   06/12/24 1411  BP: 136/86  Pulse: 94  Resp: 16  SpO2: 95%  Weight: 175 lb 3.2 oz (79.5 kg)  Height: 5' 2 (1.575 m)    Body mass index is 32.04 kg/m.   PHQ2/9:    06/12/2024    2:09 PM 10/04/2023   10:49 AM 09/16/2023    9:11 AM 04/12/2023    2:01 PM 02/15/2023    8:49 AM  Depression screen PHQ 2/9  Decreased Interest 3 2 2 2 1   Down, Depressed, Hopeless 3 1 2 2 1   PHQ - 2 Score 6 3 4 4 2   Altered sleeping 3 2 1 1 1   Tired, decreased energy 3 1 0 0 1  Change in appetite  3 2 0 0 0  Feeling bad or failure about yourself  0 1 1 1  0  Trouble concentrating 3 0 0 0 1  Moving slowly or fidgety/restless 0 0 0 0 0  Suicidal thoughts 0 0 0 0 0  PHQ-9 Score 18 9 6 6 5   Difficult doing work/chores Very difficult Somewhat difficult Somewhat difficult  Somewhat difficult    phq 9 is positive  Fall Risk:    06/12/2024    2:06 PM 10/04/2023   10:49 AM 09/16/2023    9:02 AM 04/12/2023    2:01 PM 02/15/2023    8:48 AM  Fall Risk   Falls in the past year? 0 1 1 1 1   Number falls in past yr: 0 0 0 0 1  Injury with Fall? 0 1 1 1 1   Comment   left ankle broken    Risk for fall due to : No Fall Risks History of fall(s);Orthopedic patient Impaired balance/gait;Impaired mobility No Fall Risks Impaired balance/gait  Follow up Falls evaluation completed Education provided;Falls prevention discussed;Falls evaluation completed Education provided;Falls prevention discussed Falls prevention discussed Falls prevention discussed;Education provided;Falls evaluation completed    Assessment & Plan Chronic left upper quadrant abdominal pain with radiation to  back Chronic pain possibly due to pancreatic dysfunction. Previous ultrasound normal, MRI pending. - Order MRI of the pancreas. - Refer to GI doctor for further evaluation and potential pancreatic enzyme prescription.  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 Linzess  ineffective, IBSrela  beneficial but insurance issues present. - Contact GI doctor to address prescription and insurance issues for IBSrela . - Consider bowel prep as advised by GI doctor.  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 Severe pain affecting daily activities. - Refer to pain management specialist, Dr. Marcelino, for evaluation and potential treatment options such as injections.  Multiple sclerosis Multiple sclerosis with resolved lesions. Current symptoms include fatigue and tingling. Recent MRI shows worsening hyperintensities. Tecfidera  effective. - Continue Tecfidera  240 mg twice daily.  Hypertension Hypertension well-controlled with current medication. - Continue current antihypertensive medication regimen.  Migraine without status migrainosus Infrequent migraines managed with Ubrelvy . - Continue Ubrelvy  as needed for migraine management.  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 - Monitor white blood cell count with routine lab work.  Vitamin D  deficiency Vitamin D  deficiency previously treated, current status unknown. - Check vitamin D  levels with routine lab work.  Prediabetes Prediabetes with previous elevated A1c, current status unknown. - Check A1c with routine lab work.  General Health Maintenance Routine health maintenance required. - Check thyroid  function with routine lab work. - Check cholesterol levels with routine lab work.

## 2024-06-12 NOTE — Addendum Note (Signed)
 Addended by: GLENARD MIRE F on: 06/12/2024 04:48 PM   Modules accepted: Level of Service

## 2024-06-18 ENCOUNTER — Encounter: Payer: Self-pay | Admitting: Family Medicine

## 2024-06-18 ENCOUNTER — Other Ambulatory Visit: Payer: Self-pay | Admitting: Family Medicine

## 2024-06-18 ENCOUNTER — Ambulatory Visit
Admission: RE | Admit: 2024-06-18 | Discharge: 2024-06-18 | Disposition: A | Discharge status: Home / Self Care | Source: Ambulatory Visit | Attending: Family Medicine | Admitting: Family Medicine

## 2024-06-18 DIAGNOSIS — G8929 Other chronic pain: Secondary | ICD-10-CM

## 2024-06-18 DIAGNOSIS — F419 Anxiety disorder, unspecified: Secondary | ICD-10-CM

## 2024-06-18 MED ORDER — DIAZEPAM 5 MG PO TABS: 5.0000 mg | ORAL_TABLET | Freq: Every day | ORAL | 0 refills | Status: DC | PRN

## 2024-07-17 ENCOUNTER — Ambulatory Visit: Admitting: Family Medicine

## 2024-08-07 ENCOUNTER — Ambulatory Visit
Admission: EM | Admit: 2024-08-07 | Discharge: 2024-08-07 | Disposition: A | Discharge status: Home / Self Care | Attending: Physician Assistant | Admitting: Physician Assistant

## 2024-08-07 ENCOUNTER — Encounter: Payer: Self-pay | Admitting: Emergency Medicine

## 2024-08-07 DIAGNOSIS — R051 Acute cough: Secondary | ICD-10-CM | POA: Insufficient documentation

## 2024-08-07 DIAGNOSIS — R0981 Nasal congestion: Secondary | ICD-10-CM | POA: Insufficient documentation

## 2024-08-07 DIAGNOSIS — J069 Acute upper respiratory infection, unspecified: Secondary | ICD-10-CM | POA: Diagnosis present

## 2024-08-07 LAB — SARS CORONAVIRUS 2 BY RT PCR: SARS Coronavirus 2 by RT PCR: NEGATIVE

## 2024-08-07 MED ORDER — PROMETHAZINE-DM 6.25-15 MG/5ML PO SYRP: 5.0000 mL | ORAL_SOLUTION | Freq: Four times a day (QID) | ORAL | 0 refills | Status: DC | PRN

## 2024-08-07 MED ORDER — IPRATROPIUM BROMIDE 0.06 % NA SOLN: 2.0000 | Freq: Four times a day (QID) | NASAL | 0 refills | Status: DC

## 2024-08-07 NOTE — ED Provider Notes (Signed)
 [facility]    CSN: [id number] Arrival date & time: 08/07/24  1726      History   Chief Complaint Chief Complaint  Patient presents with   Cough   Headache    HPI Tammie Lopez is a 53 y.o. female presenting for fatigue, headaches, cough, and congestion x 3 days.   Denies fever, ear pain, sinus pain, chest pain, wheezing, shortness of breath, abdominal pain, vomiting or diarrhea.  He teenage son has also been ill. Patient has been taking over-the-counter cough meds without relief. No other complaints.  Patient's medical history significant for multiple sclerosis, migraines, GERD, hyperlipidemia, depression, hypertension, insulin  resistance, obesity and chronic low back pain.  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 07/16/2020   Low TSH level 09/19/2019   Obesity (BMI 30-39.9) 12/06/2017   Insulin  resistance 09/27/2017   Essential hypertension 09/21/2017   Right sided sciatica 09/21/2017   Major depression, recurrent (HCC) 05/24/2017   Migraine without aura, not intractable 12/09/2016   Nickel allergy 12/09/2016   GERD without esophagitis 12/09/2016   Other neutropenia (HCC) 12/09/2016   Dyslipidemia 12/09/2016   Diverticulosis 12/09/2016   Allergic rhinitis, seasonal 12/09/2016   Chronic constipation 12/09/2016   Vitamin D  deficiency 08/06/2013   MS  (multiple sclerosis) (HCC) 04/17/2013    Past Surgical History:  Procedure Laterality Date   BREAST BIOPSY Left 01/06/2018    2 areas  neg   BUNIONECTOMY     CESAREAN SECTION N/A 2006   COLONOSCOPY WITH PROPOFOL  N/A 02/09/2021   Procedure: COLONOSCOPY WITH PROPOFOL ;  Surgeon: Jinny Carmine, MD;  Location: [facility];  Service: Endoscopy;  Laterality: N/A;   GANGLION CYST EXCISION Bilateral    Hands   POLYPECTOMY N/A 02/09/2021   Procedure: POLYPECTOMY;  Surgeon: Jinny Carmine, MD;  Location: [facility];  Service: Endoscopy;  Laterality: N/A;    OB History   No obstetric history on file.      Home Medications    Prior to Admission medications   Medication Sig Start Date End Date Taking? Authorizing Provider  ipratropium (ATROVENT ) 0.06 % nasal spray Place 2 sprays into both nostrils 4 (four) times daily. 08/07/24  Yes Arvis Jolan NOVAK, PA-C  promethazine -dextromethorphan (PROMETHAZINE -DM) 6.25-15 MG/5ML syrup Take 5 mLs by mouth 4 (four) times daily as needed. 08/07/24  Yes Arvis Jolan B, PA-C  amLODipine  (NORVASC ) 2.5 MG tablet Take 1 tablet (2.5 mg total) by mouth daily. 06/12/24   Sowles, Krichna, MD  celecoxib (CELEBREX) 200 MG capsule Take 200 mg by mouth daily.    [provider]  Cholecalciferol (VITAMIN D ) 50 MCG (2000 UT) CAPS Take 2,000 Units by mouth daily.    [provider]  diazepam  (VALIUM ) 5 MG tablet Take 1 tablet (5 mg total) by  mouth daily as needed for anxiety. Take 30 minutes before MRI and may repeat another dose right before if needed. Cannot drive 2/78/74   Sowles, Krichna, MD  Dimethyl Fumarate  (TECFIDERA ) 240 MG CPDR Take 1 capsule by mouth in the morning and at bedtime.    Inc, [employer]  omeprazole  (PRILOSEC) 40 MG capsule TAKE 1 CAPSULE BY MOUTH EVERY DAY 06/12/24   Sowles, Krichna, MD  traZODone  (DESYREL ) 50 MG tablet TAKE 1/2 TO 1 TABLET BY MOUTH AT BEDTIME AS NEEDED FOR SLEEP 03/01/22   Glenard, Krichna, MD   Ubrogepant  (UBRELVY ) 100 MG TABS Take 1 tablet (100 mg total) by mouth daily as needed. For migraine headaches 10/04/23   Sowles, Krichna, MD    Family History Family History  Problem Relation Age of Onset   Diabetes Mother    Hypertension Mother    Cancer Father 79       Mouth   COPD Father    Hypertension Brother    Diabetes Brother    Breast cancer Neg Hx     Social History Social History   Tobacco Use   Smoking status: Never   Smokeless tobacco: Never   Tobacco comments:    smoking cessation materials not required  Vaping Use   Vaping status: Never Used  Substance Use Topics   Alcohol use: No   Drug use: No     Allergies   Penicillins   Review of Systems Review of Systems  Constitutional:  Negative for chills, diaphoresis, fatigue and fever.  HENT:  Positive for congestion and rhinorrhea. Negative for ear pain, sinus pressure, sinus pain and sore throat.   Respiratory:  Positive for cough. Negative for shortness of breath.   Cardiovascular:  Negative for chest pain.  Gastrointestinal:  Negative for abdominal pain, nausea and vomiting.  Musculoskeletal:  Negative for arthralgias and myalgias.  Skin:  Negative for rash.  Neurological:  Positive for headaches. Negative for weakness.  Hematological:  Negative for adenopathy.     Physical Exam Triage Vital Signs ED Triage Vitals  Encounter Vitals Group     BP      Girls Systolic BP Percentile      Girls Diastolic BP Percentile      Boys Systolic BP Percentile      Boys Diastolic BP Percentile      Pulse      Resp      Temp      Temp src      SpO2      Weight      Height      Head Circumference      Peak Flow      Pain Score      Pain Loc      Pain Education      Exclude from Growth Chart    No data found.  Updated Vital Signs BP (!) 147/94 (BP Location: Left Arm)   Pulse 86   Temp 98.4 F (36.9 C) (Oral)   Resp 16   Wt 176 lb (79.8 kg)   LMP 03/16/2022   SpO2 97%   BMI 32.19 kg/m     Physical Exam Vitals and nursing note reviewed.  Constitutional:      General: She is not in acute distress.    Appearance: Normal appearance. She is well-developed. She is not ill-appearing or toxic-appearing.  HENT:     Head: Normocephalic and atraumatic.     Nose: Congestion present.  Mouth/Throat:     Mouth: Mucous membranes are moist.     Pharynx: Oropharynx is clear.  Eyes:     General: No scleral icterus.       Right eye: No discharge.        Left eye: No discharge.     Conjunctiva/sclera: Conjunctivae normal.  Cardiovascular:     Rate and Rhythm: Normal rate and regular rhythm.     Heart sounds: Normal heart sounds.  Pulmonary:     Effort: Pulmonary effort is normal. No respiratory distress.     Breath sounds: Normal breath sounds.  Musculoskeletal:     Cervical back: Neck supple.  Skin:    General: Skin is dry.  Neurological:     General: No focal deficit present.     Mental Status: She is alert. Mental status is at baseline.     Motor: No weakness.     Gait: Gait normal.  Psychiatric:        Mood and Affect: Mood normal.        Behavior: Behavior normal.      UC Treatments / Results  Labs (all labs ordered are listed, but only abnormal results are displayed) Labs Reviewed  SARS CORONAVIRUS 2 BY RT PCR    EKG   Radiology No results found.  Procedures Procedures (including critical care time)  Medications Ordered in UC Medications - No data to display  Initial Impression / Assessment and Plan / UC Course  I have reviewed the triage vital signs and the nursing notes.  Pertinent labs & imaging results that were available during my care of the patient were reviewed by me and considered in my medical decision making (see chart for details).   53 year old female with history of MS, obesity, hyperlipidemia, migraines and insulin  resistance presents for nasal congestion, cough, fatigue and headaches x 3 days.  Her teenage son has been ill  2.  She is afebrile and overall well-appearing.  No acute distress.  On exam has nasal congestion.  Throat is clear.  Chest clear.  COVID test obtained.  Negative.  Viral URI.  Supportive care encouraged increasing rest and fluids.  Reviewed supportive care and typical course of most viral illnesses.  Sent Promethazine  DM and Atrovent  nasal spray to pharmacy.  Work note was given.   Final Clinical Impressions(s) / UC Diagnoses   Final diagnoses:  Viral upper respiratory tract infection  Acute cough  Nasal congestion     Discharge Instructions      URI/COLD SYMPTOMS: Your exam today is consistent with a viral illness. Antibiotics are not indicated at this time. Use medications as directed, including cough syrup, nasal saline, and decongestants. Your symptoms should improve over the next few days and resolve within 7-10 days. Increase rest and fluids. F/u if symptoms worsen or predominate such as sore throat, ear pain, productive cough, shortness of breath, or if you develop high fevers or worsening fatigue over the next several days.       ED Prescriptions     Medication Sig Dispense Auth. Provider   promethazine -dextromethorphan (PROMETHAZINE -DM) 6.25-15 MG/5ML syrup Take 5 mLs by mouth 4 (four) times daily as needed. 118 mL Arvis Huxley B, PA-C   ipratropium (ATROVENT ) 0.06 % nasal spray Place 2 sprays into both nostrils 4 (four) times daily. 15 mL Arvis Huxley NOVAK, PA-C      I have reviewed the PDMP during this encounter.   Arvis Huxley NOVAK, PA-C 08/07/24 1932

## 2024-08-07 NOTE — Discharge Instructions (Signed)

## 2024-08-07 NOTE — ED Triage Notes (Signed)
 Pt presents with a cough, chest congestion and headache x 3 days. Pt has taken OTC cold medication for her symptoms.

## 2024-08-09 ENCOUNTER — Ambulatory Visit: Admitting: Student in an Organized Health Care Education/Training Program

## 2024-08-21 ENCOUNTER — Ambulatory Visit (INDEPENDENT_AMBULATORY_CARE_PROVIDER_SITE_OTHER): Payer: Self-pay | Admitting: Plastic Surgery

## 2024-08-21 ENCOUNTER — Encounter: Payer: Self-pay | Admitting: Plastic Surgery

## 2024-08-21 VITALS — BP 160/96 | HR 82 | Temp 98.5°F | Ht 62.0 in | Wt 174.6 lb

## 2024-08-21 DIAGNOSIS — M5442 Lumbago with sciatica, left side: Secondary | ICD-10-CM

## 2024-08-21 DIAGNOSIS — M793 Panniculitis, unspecified: Secondary | ICD-10-CM | POA: Insufficient documentation

## 2024-08-21 DIAGNOSIS — M7918 Myalgia, other site: Secondary | ICD-10-CM

## 2024-08-21 DIAGNOSIS — G8929 Other chronic pain: Secondary | ICD-10-CM

## 2024-08-21 NOTE — Progress Notes (Signed)
 Patient ID: Tammie Lopez, female    DOB: 09/13/1971, 53 y.o.   MRN: [id number]   Chief Complaint  Patient presents with   Advice Only    consult for abdominoplasty    The patient is a 53 year old female here for evaluation of her abdomen.  She is interested in either an abdominoplasty or panniculectomy.  She is 5 feet 2 inches tall and weighs 174 pounds.  She has had a C-section in the past and it healed nicely.  She does not have any signs of a hernia.  She is in good health.  She has had hemoglobin A1c at 6.1 last year.  No sign of smoking.  She has had to use a lot of creams and powders to keep the rash under control in her skin folds.  It certainly more difficult in the summer.  Her past medical history is positive for multiple sclerosis, migraines, hypertension and muscle spasms.  One of her main complaints is back pain and pain in her left flank area.  Patient states she has had multiple exams including MRI and CT scan with no indication of the cause of the abdominal pain.  On exam there is no sign of a hernia but she was tender in the left lower flank area.    Review of Systems  Constitutional:  Positive for activity change. Negative for appetite change.  Eyes: Negative.   Respiratory: Negative.    Cardiovascular: Negative.   Gastrointestinal: Negative.   Endocrine: Negative.   Genitourinary: Negative.   Musculoskeletal:  Positive for back pain and neck pain.  Skin:  Positive for rash.    Past Medical History:  Diagnosis Date   Allergy    Constipation    Diverticula of colon    Family history of adverse reaction to anesthesia    mother's BP increased   Hypertension    Insomnia    Migraine    migraines   MS (multiple sclerosis)    Muscle spasm     Past Surgical History:  Procedure Laterality Date   BREAST BIOPSY Left 01/06/2018    2 areas  neg   BUNIONECTOMY     CESAREAN SECTION N/A 2006   COLONOSCOPY WITH PROPOFOL  N/A 02/09/2021   Procedure: COLONOSCOPY WITH  PROPOFOL ;  Surgeon: Jinny Carmine, MD;  Location: [facility];  Service: Endoscopy;  Laterality: N/A;   GANGLION CYST EXCISION Bilateral    Hands   POLYPECTOMY N/A 02/09/2021   Procedure: POLYPECTOMY;  Surgeon: Jinny Carmine, MD;  Location: [facility];  Service: Endoscopy;  Laterality: N/A;      Current Outpatient Medications:    amLODipine  (NORVASC ) 2.5 MG tablet, Take 1 tablet (2.5 mg total) by mouth daily., Disp: 90 tablet, Rfl: 1   celecoxib (CELEBREX) 200 MG capsule, Take 200 mg by mouth daily., Disp: , Rfl:    Cholecalciferol (VITAMIN D ) 50 MCG (2000 UT) CAPS, Take 2,000 Units by mouth daily., Disp: , Rfl:    Dimethyl Fumarate  (TECFIDERA ) 240 MG CPDR, Take 1 capsule by mouth in the morning and at bedtime., Disp: , Rfl:    omeprazole  (PRILOSEC) 40 MG capsule, TAKE 1 CAPSULE BY MOUTH EVERY DAY, Disp: 90 capsule, Rfl: 1   traZODone  (DESYREL ) 50 MG tablet, TAKE 1/2 TO 1 TABLET BY MOUTH AT BEDTIME AS NEEDED FOR SLEEP, Disp: 90 tablet, Rfl: 0   Ubrogepant  (UBRELVY ) 100 MG TABS, Take 1 tablet (100 mg total) by mouth daily as needed. For migraine headaches, Disp:  16 tablet, Rfl: 1   diazepam  (VALIUM ) 5 MG tablet, Take 1 tablet (5 mg total) by mouth daily as needed for anxiety. Take 30 minutes before MRI and may repeat another dose right before if needed. Cannot drive (Patient not taking: Reported on 08/21/2024), Disp: 2 tablet, Rfl: 0   ipratropium (ATROVENT ) 0.06 % nasal spray, Place 2 sprays into both nostrils 4 (four) times daily. (Patient not taking: Reported on 08/21/2024), Disp: 15 mL, Rfl: 0   promethazine -dextromethorphan (PROMETHAZINE -DM) 6.25-15 MG/5ML syrup, Take 5 mLs by mouth 4 (four) times daily as needed. (Patient not taking: Reported on 08/21/2024), Disp: 118 mL, Rfl: 0   Objective:   Vitals:   08/21/24 1006  BP: (!) 160/96  Pulse: 82  Temp: 98.5 F (36.9 C)  SpO2: 96%    Physical Exam Vitals reviewed.  Constitutional:      Appearance: Normal appearance.   HENT:     Head: Atraumatic.  Cardiovascular:     Rate and Rhythm: Normal rate.     Pulses: Normal pulses.  Pulmonary:     Effort: Pulmonary effort is normal.  Abdominal:     General: There is no distension.     Palpations: Abdomen is soft. There is no mass.     Tenderness: There is abdominal tenderness.     Hernia: No hernia is present.  Skin:    General: Skin is warm.     Capillary Refill: Capillary refill takes less than 2 seconds.  Neurological:     Mental Status: She is alert and oriented to person, place, and time.  Psychiatric:        Mood and Affect: Mood normal.        Behavior: Behavior normal.        Thought Content: Thought content normal.        Judgment: Judgment normal.     Assessment & Plan:  Chronic bilateral low back pain with left-sided sciatica  Muscular abdominal pain in left upper quadrant  Panniculitis  The patient is planning to lose a little bit more weight.  She is also willing to see the healthy weight and wellness folks.  I think that is a really good idea.  She should lose the weight prior to the surgery.  This will help with her overall result.  I be happy to see the patient back in 3 to 6 months.  We can certainly consider a panniculectomy or abdominoplasty.  Pictures were obtained of the patient and placed in the chart with the patient's or guardian's permission.   Estefana RAMAN Holly Iannaccone, DO

## 2024-08-22 NOTE — Addendum Note (Signed)
 Addended by: DEVONDA ISAIAH SAILOR on: 08/22/2024 03:53 PM   Modules accepted: Orders

## 2024-09-06 ENCOUNTER — Encounter (INDEPENDENT_AMBULATORY_CARE_PROVIDER_SITE_OTHER): Payer: Self-pay

## 2024-09-14 ENCOUNTER — Telehealth (INDEPENDENT_AMBULATORY_CARE_PROVIDER_SITE_OTHER): Admitting: Family Medicine

## 2024-09-14 ENCOUNTER — Encounter: Payer: Self-pay | Admitting: Family Medicine

## 2024-09-14 DIAGNOSIS — G35D Multiple sclerosis, unspecified: Secondary | ICD-10-CM | POA: Diagnosis not present

## 2024-09-14 DIAGNOSIS — F320 Major depressive disorder, single episode, mild: Secondary | ICD-10-CM

## 2024-09-14 DIAGNOSIS — K219 Gastro-esophageal reflux disease without esophagitis: Secondary | ICD-10-CM | POA: Diagnosis not present

## 2024-09-14 DIAGNOSIS — N951 Menopausal and female climacteric states: Secondary | ICD-10-CM

## 2024-09-14 DIAGNOSIS — F330 Major depressive disorder, recurrent, mild: Secondary | ICD-10-CM

## 2024-09-14 DIAGNOSIS — I10 Essential (primary) hypertension: Secondary | ICD-10-CM

## 2024-09-14 MED ORDER — DULOXETINE HCL 60 MG PO CPEP: 60.0000 mg | ORAL_CAPSULE | Freq: Every day | ORAL | 0 refills | Status: DC

## 2024-09-14 MED ORDER — PREMPRO 0.3-1.5 MG PO TABS: 1.0000 | ORAL_TABLET | Freq: Every day | ORAL | 0 refills | Status: AC

## 2024-09-14 MED ORDER — DULOXETINE HCL 30 MG PO CPEP: 30.0000 mg | ORAL_CAPSULE | Freq: Every day | ORAL | 0 refills | Status: DC

## 2024-09-14 NOTE — Progress Notes (Signed)
 Name: Tammie Lopez   MRN: [id number]    DOB: 09/04/1971   Date:09/14/2024       Progress Note  Subjective  Chief Complaint  Chief Complaint  Patient presents with   mood   Menopause    I connected with  Tammie Lopez  on 09/14/24 at  3:20 PM EDT by a video enabled telemedicine application and verified that I am speaking with the correct person using two identifiers.  I discussed the limitations of evaluation and management by telemedicine and the availability of in person appointments. The patient expressed understanding and agreed to proceed with the virtual visit  Staff also discussed with the patient that there may be a patient responsible charge related to this service. Patient Location: at home  Provider Location: [facility] Additional Individuals present: alone   Discussed the use of AI scribe software for clinical note transcription with the patient, who gave verbal consent to proceed.  History of Present Illness Tammie Lopez is a 53 year old female who presents with mood swings and depression.  She has been experiencing severe mood swings and depression, which she attributes to menopause. The mood swings involve rapid shifts from happiness to irritability and have been present for less than a year, intensifying over the past month and a half. Her last menstrual cycle was over two years ago.  Her past medical history includes high blood pressure, managed with amlodipine  2.5 mg daily, and multiple sclerosis, treated with Tecfidera . She also has a history of migraines without aura and chronic mild depression. Her depression score was notably high during a previous visit in July, although it has since decreased. She has tried various medications for depression in the past, including Lexapro  and Effexor , but was not consistent with their use.  She currently takes omeprazole  40 mg as needed for reflux, which she reports is effective. No family history of bipolar disorder. She has  not completed recent blood work due to difficulty with blood draws, resulting in prolonged soreness in her arm.    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 07/16/2020   Low TSH level 09/19/2019   Obesity (BMI 30-39.9) 12/06/2017   Insulin  resistance 09/27/2017   Essential hypertension 09/21/2017   Right sided sciatica 09/21/2017   Major depression, recurrent 05/24/2017   Migraine without aura, not intractable 12/09/2016   Nickel allergy 12/09/2016   GERD without esophagitis 12/09/2016   Other neutropenia 12/09/2016   Dyslipidemia 12/09/2016   Diverticulosis 12/09/2016   Allergic rhinitis, seasonal 12/09/2016   Chronic constipation 12/09/2016   Vitamin D  deficiency 08/06/2013   MS (multiple sclerosis) 04/17/2013    Past Surgical History:  Procedure Laterality Date   BREAST BIOPSY Left 01/06/2018    2 areas  neg   BUNIONECTOMY     CESAREAN SECTION N/A 2006   COLONOSCOPY WITH PROPOFOL  N/A 02/09/2021   Procedure: COLONOSCOPY WITH PROPOFOL ;  Surgeon: Jinny Carmine, MD;  Location: [facility];  Service: Endoscopy;  Laterality: N/A;   GANGLION CYST EXCISION Bilateral    Hands   POLYPECTOMY N/A 02/09/2021   Procedure: POLYPECTOMY;  Surgeon: Jinny Carmine, MD;  Location:  [facility];  Service: Endoscopy;  Laterality: N/A;    Family History  Problem Relation Age of Onset   Diabetes Mother    Hypertension Mother    Cancer Father 31       Mouth   COPD Father    Hypertension Brother    Diabetes Brother    Breast cancer Neg Hx     Social History   Socioeconomic History   Marital status: Single    Spouse name: Not on  file   Number of children: 3   Years of education: Not on file   Highest education level: 12th grade  Occupational History    Comment: bus driver  Tobacco Use   Smoking status: Never   Smokeless tobacco: Never   Tobacco comments:    smoking cessation materials not required  Vaping Use   Vaping status: Never Used  Substance and Sexual Activity   Alcohol use: No   Drug use: No   Sexual activity: Not Currently    Partners: Male  Other Topics Concern   Not on file  Social History Narrative   She is on disability for MS, able to work 20 hours per week, 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   Social Drivers of Health   Financial Resource Strain: Low Risk  (06/11/2024)   Overall Financial Resource Strain (CARDIA)    Difficulty of Paying Living Expenses: Not very hard  Food Insecurity: Patient Declined (06/11/2024)   Hunger Vital Sign    Worried About Running Out of Food in the Last Year: Patient declined    Ran Out of Food in the Last Year: Patient declined  Transportation Needs: No Transportation Needs (06/11/2024)   PRAPARE - Administrator, Civil Service (Medical): No    Lack of Transportation (Non-Medical): No  Physical Activity: Inactive (06/11/2024)   Exercise Vital Sign    Days of Exercise per Week: 0 days    Minutes of Exercise per Session: Not on file  Stress: Stress Concern Present (06/11/2024)   [employer] of Occupational Health - Occupational Stress Questionnaire    Feeling of Stress: Rather much  Social Connections: Socially Isolated (06/11/2024)   Social Connection and Isolation Panel    Frequency of Communication with Friends and Family: More than three times a week    Frequency of Social Gatherings with Friends and Family: More than three times a week    Attends Religious Services: Patient declined    Database administrator or Organizations: No    Attends Engineer, structural: Not on file    Marital Status: Never married  Intimate  Partner Violence: Not At Risk (09/16/2023)   Humiliation, Afraid, Rape, and Kick questionnaire    Fear of Current or Ex-Partner: No    Emotionally Abused: No    Physically Abused: No    Sexually Abused: No     Current Outpatient Medications:    amLODipine  (NORVASC ) 2.5 MG tablet, Take 1 tablet (2.5 mg total) by mouth daily., Disp: 90 tablet, Rfl: 1   Cholecalciferol (VITAMIN D ) 50 MCG (2000 UT) CAPS, Take 2,000 Units by mouth daily., Disp: , Rfl:    Dimethyl Fumarate  (TECFIDERA ) 240 MG CPDR, Take 1 capsule by mouth in the morning and at bedtime., Disp: , Rfl:    omeprazole  (PRILOSEC) 40 MG capsule, TAKE 1 CAPSULE BY MOUTH EVERY DAY, Disp: 90 capsule, Rfl: 1   traZODone  (DESYREL ) 50 MG tablet, TAKE 1/2 TO 1 TABLET BY MOUTH AT  BEDTIME AS NEEDED FOR SLEEP, Disp: 90 tablet, Rfl: 0   Ubrogepant  (UBRELVY ) 100 MG TABS, Take 1 tablet (100 mg total) by mouth daily as needed. For migraine headaches, Disp: 16 tablet, Rfl: 1   celecoxib (CELEBREX) 200 MG capsule, Take 200 mg by mouth daily. (Patient not taking: Reported on 09/14/2024), Disp: , Rfl:    diazepam  (VALIUM ) 5 MG tablet, Take 1 tablet (5 mg total) by mouth daily as needed for anxiety. Take 30 minutes before MRI and may repeat another dose right before if needed. Cannot drive (Patient not taking: Reported on 09/14/2024), Disp: 2 tablet, Rfl: 0   ipratropium (ATROVENT ) 0.06 % nasal spray, Place 2 sprays into both nostrils 4 (four) times daily. (Patient not taking: Reported on 09/14/2024), Disp: 15 mL, Rfl: 0   promethazine -dextromethorphan (PROMETHAZINE -DM) 6.25-15 MG/5ML syrup, Take 5 mLs by mouth 4 (four) times daily as needed. (Patient not taking: Reported on 09/14/2024), Disp: 118 mL, Rfl: 0  Allergies  Allergen Reactions   Penicillins Hives    I personally reviewed active problem list, medication list, allergies with the patient/caregiver today.   ROS  Ten systems reviewed and is negative except as mentioned in HPI     Objective  Virtual encounter, vitals not obtained.   Physical Exam  Awake alert and oriented   PHQ2/9:    09/14/2024    9:17 AM 06/12/2024    2:09 PM 10/04/2023   10:49 AM 09/16/2023    9:11 AM 04/12/2023    2:01 PM  Depression screen PHQ 2/9  Decreased Interest 1 3 2 2 2   Down, Depressed, Hopeless 1 3 1 2 2   PHQ - 2 Score 2 6 3 4 4   Altered sleeping 1 3 2 1 1   Tired, decreased energy 1 3 1  0 0  Change in appetite 1 3 2  0 0  Feeling bad or failure about yourself  0 0 1 1 1   Trouble concentrating 1 3 0 0 0  Moving slowly or fidgety/restless 0 0 0 0 0  Suicidal thoughts 0 0 0 0 0  PHQ-9 Score 6 18 9 6 6   Difficult doing work/chores Somewhat difficult Very difficult Somewhat difficult Somewhat difficult    PHQ-2/9 Result is positive.    Fall Risk:    09/14/2024    9:17 AM 06/12/2024    2:06 PM 10/04/2023   10:49 AM 09/16/2023    9:02 AM 04/12/2023    2:01 PM  Fall Risk   Falls in the past year? 0 0 1 1 1   Number falls in past yr: 0 0 0 0 0  Injury with Fall? 0 0 1 1 1   Comment    left ankle broken   Risk for fall due to : No Fall Risks No Fall Risks History of fall(s);Orthopedic patient Impaired balance/gait;Impaired mobility No Fall Risks  Follow up Falls evaluation completed Falls evaluation completed Education provided;Falls prevention discussed;Falls evaluation completed Education provided;Falls prevention discussed Falls prevention discussed     Assessment & Plan Menopausal symptoms with mood disturbance Mood swings and joint aches linked to menopause. HRT considered for symptom relief, with noted risks and benefits. Suitable for HRT initiation within ten years of menopause onset. HRT may not address depression. - Prescribe low dose Prempro for three months.  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 Previous antidepressants tried without significant side effects.  Duloxetine selected for depression and musculoskeletal pain relief. - Prescribe duloxetine 30 mg  for the first month, then increase to 60 mg.  Essential hypertension Hypertension effectively managed with amlodipine  2.5 mg.  Multiple sclerosis Stable on Tecfidera  with no issues reported.  Migraine without aura History of migraines without aura.  Gastroesophageal reflux disease (GERD) GERD managed with omeprazole  40 mg as needed, effective.  General Health Maintenance Blood work pending due to previous dehydration. Cholesterol monitoring advised. - Advise fasting blood work and flu shot. - Check cholesterol levels.  Follow-Up Follow-up to evaluate medication effectiveness and condition monitoring. - Schedule follow-up appointment in four weeks, preferably in person, to assess response to treatment and check blood pressure.      I discussed the assessment and treatment plan with the patient. The patient was provided an opportunity to ask questions and all were answered. The patient agreed with the plan and demonstrated an understanding of the instructions.  The patient was advised to call back or seek an in-person evaluation if the symptoms worsen or if the condition fails to improve as anticipated.  I provided 25  minutes of non-face-to-face time during this encounter.

## 2024-09-17 ENCOUNTER — Encounter: Payer: Self-pay | Admitting: Family Medicine

## 2024-09-18 ENCOUNTER — Encounter: Payer: Self-pay | Admitting: Family Medicine

## 2024-09-18 ENCOUNTER — Other Ambulatory Visit: Payer: Self-pay | Admitting: Family Medicine

## 2024-09-18 MED ORDER — AUVELITY 45-105 MG PO TBCR: 1.0000 | EXTENDED_RELEASE_TABLET | ORAL | 0 refills | Status: DC

## 2024-09-20 ENCOUNTER — Ambulatory Visit: Payer: 59

## 2024-09-20 DIAGNOSIS — Z0000 Encounter for general adult medical examination without abnormal findings: Secondary | ICD-10-CM

## 2024-09-20 DIAGNOSIS — Z1231 Encounter for screening mammogram for malignant neoplasm of breast: Secondary | ICD-10-CM

## 2024-09-20 NOTE — Patient Instructions (Addendum)
 Tammie Lopez,  Thank you for taking the time for your Medicare Wellness Visit. I appreciate your continued commitment to your health goals. Please review the care plan we discussed, and feel free to reach out if I can assist you further.  Medicare recommends these wellness visits once per year to help you and your care team stay ahead of potential health issues. These visits are designed to focus on prevention, allowing your provider to concentrate on managing your acute and chronic conditions during your regular appointments.  Please note that Annual Wellness Visits do not include a physical exam. Some assessments may be limited, especially if the visit was conducted virtually. If needed, we may recommend a separate in-person follow-up with your provider.  Ongoing Care Seeing your primary care provider every 3 to 6 months helps us  monitor your health and provide consistent, personalized care.   Referrals If a referral was made during today's visit and you haven't received any updates within two weeks, please contact the referred provider directly to check on the status. REFERRAL FOR MAMMOGRAM SENT You have an order for:  []   2D Mammogram  [x]   3D Mammogram  []   Bone Density     Please call for appointment:  [facility]  [address] Tammie Lopez Lopez [phone] [facility] [address] [place], Tammie Lopez [phone] [facility] [address] Tammie Lopez Tammie Lopez, Tammie Lopez [phone]   Make sure to wear two-piece clothing.  No lotions, powders, or deodorants the day of the appointment. Make sure to bring picture ID and insurance card.  Bring list of medications you are currently taking including any supplements.   Schedule your [place] screening mammogram through MyChart!   Log into your MyChart account.  Go to 'Visit' (or  'Appointments' if on mobile App) --> Schedule an Appointment  Under 'Select a Reason for Visit' choose the Mammogram Screening option.  Complete the pre-visit questions and select the time and place that best fits your schedule.   Recommended Screenings:  Health Maintenance  Topic Date Due   Hepatitis B Vaccine (1 of 3 - 19+ 3-dose series) Never done   COVID-19 Vaccine (3 - Pfizer risk series) 06/05/2020   DTaP/Tdap/Td vaccine (2 - Td or Tdap) 01/30/2023   Breast Cancer Screening  07/06/2024   Medicare Annual Wellness Visit  09/20/2025   Pap with HPV screening  06/11/2026   Colon Cancer Screening  02/10/2028   Pneumococcal Vaccine for age over 49  Completed   Hepatitis C Screening  Completed   HIV Screening  Completed   Zoster (Shingles) Vaccine  Completed   HPV Vaccine  Aged Out   Meningitis B Vaccine  Aged Out   Flu Shot  Discontinued    Advance Care Planning is important because it: Ensures you receive medical care that aligns with your values, goals, and preferences. Provides guidance to your family and loved ones, reducing the emotional burden of decision-making during critical moments.  Vision: Annual vision screenings are recommended for early detection of glaucoma, cataracts, and diabetic retinopathy. These exams can also reveal signs of chronic conditions such as diabetes and high blood pressure.  Dental: Annual dental screenings help detect early signs of oral cancer, gum disease, and other conditions linked to overall health, including heart disease and diabetes.  Please see the attached documents for additional preventive care recommendations.   NEXT  AWV 09/26/25 @ 9:30 AM BY VIDEO

## 2024-09-20 NOTE — Progress Notes (Signed)
 Subjective:   Tammie Lopez is a 53 y.o. who presents for a Medicare Wellness preventive visit.  As a reminder, Annual Wellness Visits don't include a physical exam, and some assessments may be limited, especially if this visit is performed virtually. We may recommend an in-person follow-up visit with your provider if needed.  Visit Complete: Virtual I connected with  Tammie Lopez on 09/20/24 by a audio enabled telemedicine application and verified that I am speaking with the correct person using two identifiers.  Patient Location: Home  Provider Location: Home Office  I discussed the limitations of evaluation and management by telemedicine. The patient expressed understanding and agreed to proceed.  Vital Signs: Because this visit was a virtual/telehealth visit, some criteria may be missing or patient reported. Any vitals not documented were not able to be obtained and vitals that have been documented are patient reported.  VideoDeclined- This patient declined Librarian, academic. Therefore the visit was completed with audio only.  Persons Participating in Visit: Patient.  AWV Questionnaire: No: Patient Medicare AWV questionnaire was not completed prior to this visit.  Cardiac Risk Factors include: advanced age (>62men, >9 women);hypertension;dyslipidemia;obesity (BMI >30kg/m2);sedentary lifestyle     Objective:    Today's Vitals   09/20/24 0934  PainSc: 3    There is no height or weight on file to calculate BMI.     09/20/2024    9:38 AM 09/16/2023    9:14 AM 01/23/2023    3:08 PM 01/21/2023    8:45 AM 06/03/2022    9:36 AM 04/06/2022    9:36 AM 02/09/2022    1:49 PM  Advanced Directives  Does Patient Have a Medical Advance Directive? No No No No No No No  Would patient like information on creating a medical advance directive? No - Patient declined    No - Patient declined  No - Patient declined    Current Medications (verified) Outpatient  Encounter Medications as of 09/20/2024  Medication Sig   amLODipine  (NORVASC ) 2.5 MG tablet Take 1 tablet (2.5 mg total) by mouth daily.   Cholecalciferol (VITAMIN D ) 50 MCG (2000 UT) CAPS Take 2,000 Units by mouth daily.   Dextromethorphan-buPROPion ER (AUVELITY) 45-105 MG TBCR Take 1 tablet by mouth every morning.   Dimethyl Fumarate  (TECFIDERA ) 240 MG CPDR Take 1 capsule by mouth in the morning and at bedtime.   estrogen, conjugated,-medroxyprogesterone (PREMPRO) 0.3-1.5 MG tablet Take 1 tablet by mouth daily.   omeprazole  (PRILOSEC) 40 MG capsule TAKE 1 CAPSULE BY MOUTH EVERY DAY (Patient taking differently: TAKES PRN)   traZODone  (DESYREL ) 50 MG tablet TAKE 1/2 TO 1 TABLET BY MOUTH AT BEDTIME AS NEEDED FOR SLEEP   Ubrogepant  (UBRELVY ) 100 MG TABS Take 1 tablet (100 mg total) by mouth daily as needed. For migraine headaches   No facility-administered encounter medications on file as of 09/20/2024.    Allergies (verified) Penicillins   History: Past Medical History:  Diagnosis Date   Allergy    Constipation    Diverticula of colon    Family history of adverse reaction to anesthesia    mother's BP increased   Hypertension    Insomnia    Migraine    migraines   MS (multiple sclerosis)    Muscle spasm    Past Surgical History:  Procedure Laterality Date   BREAST BIOPSY Left 01/06/2018    2 areas  neg   BUNIONECTOMY     CESAREAN SECTION N/A 2006   COLONOSCOPY  WITH PROPOFOL  N/A 02/09/2021   Procedure: COLONOSCOPY WITH PROPOFOL ;  Surgeon: Jinny Carmine, MD;  Location: [facility];  Service: Endoscopy;  Laterality: N/A;   GANGLION CYST EXCISION Bilateral    Hands   POLYPECTOMY N/A 02/09/2021   Procedure: POLYPECTOMY;  Surgeon: Jinny Carmine, MD;  Location: [facility];  Service: Endoscopy;  Laterality: N/A;   Family History  Problem Relation Age of Onset   Diabetes Mother    Hypertension Mother    Cancer Father 65       Mouth   COPD Father    Hypertension  Brother    Diabetes Brother    Breast cancer Neg Hx    Social History   Socioeconomic History   Marital status: Single    Spouse name: Not on file   Number of children: 3   Years of education: Not on file   Highest education level: 12th grade  Occupational History    Comment: bus driver  Tobacco Use   Smoking status: Never   Smokeless tobacco: Never   Tobacco comments:    smoking cessation materials not required  Vaping Use   Vaping status: Never Used  Substance and Sexual Activity   Alcohol use: No   Drug use: No   Sexual activity: Not Currently    Partners: Male  Other Topics Concern   Not on file  Social History Narrative   She is on disability for MS, able to work 20 hours per week, 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   Social Drivers of Health   Financial Resource Strain: Low Risk  (09/20/2024)   Overall Financial Resource Strain (CARDIA)    Difficulty of Paying Living Expenses: Not very hard  Food Insecurity: No Food Insecurity (09/20/2024)   Hunger Vital Sign    Worried About Running Out of Food in the Last Year: Never true    Ran Out of Food in the Last Year: Never true  Transportation Needs: No Transportation Needs (09/20/2024)   PRAPARE - Administrator, Civil Service (Medical): No    Lack of Transportation (Non-Medical): No  Physical Activity: Insufficiently Active (09/20/2024)   Exercise Vital Sign    Days of Exercise per Week: 2 days    Minutes of Exercise per Session: 30 min  Stress: Stress Concern Present (09/20/2024)   [employer] of Occupational Health - Occupational Stress Questionnaire    Feeling of Stress: To some extent  Social Connections: Moderately Isolated (09/20/2024)   Social Connection and Isolation Panel    Frequency of Communication with Friends and Family: More than three times a week    Frequency of Social Gatherings with Friends and Family: More than three times a week    Attends Religious Services: More than  4 times per year    Active Member of [employer] or Organizations: No    Attends Engineer, structural: Never    Marital Status: Never married    Tobacco Counseling Counseling given: Not Answered Tobacco comments: smoking cessation materials not required    Clinical Intake:  Pre-visit preparation completed: Yes  Pain : 0-10 Pain Score: 3  Pain Type: Chronic pain Pain Location: Back Pain Orientation: Lower Pain Descriptors / Indicators: Aching, Constant Pain Onset: More than a month ago Pain Frequency: Constant     BMI - recorded: 31.8 Nutritional Status: BMI > 30  Obese Nutritional Risks: None Diabetes: No  Lab Results  Component Value Date   HGBA1C 6.1 (A) 10/04/2023  HGBA1C 5.7 (H) 04/12/2023   HGBA1C 5.3 11/07/2022     How often do you need to have someone help you when you read instructions, pamphlets, or other written materials from your doctor or pharmacy?: 1 - Never  Interpreter Needed?: No  Information entered by :: JHONNIE DAS, LPN   Activities of Daily Living    09/20/2024    9:40 AM 10/04/2023   10:49 AM  In your present state of health, do you have any difficulty performing the following activities:  Hearing? 0 0  Vision? 0 0  Difficulty concentrating or making decisions? 0 1  Walking or climbing stairs? 1 1  Dressing or bathing? 0 0  Doing errands, shopping? 0 0  Preparing Food and eating ? N   Using the Toilet? N   In the past six months, have you accidently leaked urine? N   Do you have problems with loss of bowel control? N   Managing your Medications? N   Managing your Finances? N   Housekeeping or managing your Housekeeping? N     Patient Care Team: Sowles, Krichna, MD as PCP - General (Family Medicine) Zelasky, Clara J, PA-C as Physician Assistant (Physician Assistant) Jinny Carmine, MD as Consulting Physician (Gastroenterology) Leonce Garnette BIRCH, MD (Obstetrics and Gynecology) Elma Zachary [facility])  I have  updated your Care Teams any recent Medical Services you may have received from other providers in the past year.     Assessment:   This is a routine wellness examination for Tammie Lopez.  Hearing/Vision screen Hearing Screening - Comments:: NO AIDS Vision Screening - Comments:: READERS- [facility] [address]- SEEN EVERY YEAR   Goals Addressed             This Visit's Progress    DIET - EAT MORE FRUITS AND VEGETABLES         Depression Screen     09/20/2024    9:37 AM 09/14/2024    9:17 AM 06/12/2024    2:09 PM 10/04/2023   10:49 AM 09/16/2023    9:11 AM 04/12/2023    2:01 PM 02/15/2023    8:49 AM  PHQ 2/9 Scores  PHQ - 2 Score 0 2 6 3 4 4 2   PHQ- 9 Score 0 6 18 9 6 6 5     Fall Risk     09/20/2024    9:39 AM 09/14/2024    9:17 AM 06/12/2024    2:06 PM 10/04/2023   10:49 AM 09/16/2023    9:02 AM  Fall Risk   Falls in the past year? 0 0 0 1 1  Number falls in past yr: 0 0 0 0 0  Injury with Fall? 0 0 0 1 1  Comment     left ankle broken  Risk for fall due to :  No Fall Risks No Fall Risks History of fall(s);Orthopedic patient Impaired balance/gait;Impaired mobility  Follow up Falls evaluation completed;Falls prevention discussed Falls evaluation completed Falls evaluation completed Education provided;Falls prevention discussed;Falls evaluation completed Education provided;Falls prevention discussed    MEDICARE RISK AT HOME:  Medicare Risk at Home Any stairs in or around the home?: Yes If so, are there any without handrails?: No Home free of loose throw rugs in walkways, pet beds, electrical cords, etc?: Yes Adequate lighting in your home to reduce risk of falls?: Yes Life alert?: No Use of a cane, walker or w/c?: No Grab bars in the bathroom?: No Shower chair or bench in shower?: No Elevated  toilet seat or a handicapped toilet?: No  TIMED UP AND GO:  Was the test performed?  No  Cognitive Function: 6CIT completed        09/20/2024    9:41 AM 09/16/2023     9:15 AM 11/24/2018   10:50 AM  6CIT Screen  What Year? 0 points 0 points 0 points  What month?  0 points 0 points  What time? 0 points 0 points 0 points  Count back from 20 0 points 0 points 0 points  Months in reverse 0 points 0 points 0 points  Repeat phrase 0 points 0 points 0 points  Total Score  0 points 0 points    Immunizations Immunization History  Administered Date(s) Administered   Influenza-Unspecified 11/19/2013, 11/04/2014   PFIZER(Purple Top)SARS-COV-2 Vaccination 04/17/2020, 05/08/2020   PNEUMOCOCCAL CONJUGATE-20 09/02/2022   Tdap 01/29/2013   Zoster Recombinant(Shingrix ) 05/28/2022, 07/28/2022    Screening Tests Health Maintenance  Topic Date Due   Hepatitis B Vaccines 19-59 Average Risk (1 of 3 - 19+ 3-dose series) Never done   COVID-19 Vaccine (3 - Pfizer risk series) 06/05/2020   DTaP/Tdap/Td (2 - Td or Tdap) 01/30/2023   Mammogram  07/06/2024   Medicare Annual Wellness (AWV)  09/20/2025   Cervical Cancer Screening (HPV/Pap Cotest)  06/11/2026   Colonoscopy  02/10/2028   Pneumococcal Vaccine: 50+ Years  Completed   Hepatitis C Screening  Completed   HIV Screening  Completed   Zoster Vaccines- Shingrix   Completed   HPV VACCINES  Aged Out   Meningococcal B Vaccine  Aged Out   Influenza Vaccine  Discontinued    Health Maintenance Items Addressed: NEEDS TDAP, DECLINES FLU SHOT; MAMMOGRAM ORDERED; UTD ON COLONOSCOPY  Additional Screening:  Vision Screening: Recommended annual ophthalmology exams for early detection of glaucoma and other disorders of the eye. Is the patient up to date with their annual eye exam?  Yes  Who is the provider or what is the name of the office in which the patient attends annual eye exams? [facility]  Dental Screening: Recommended annual dental exams for proper oral hygiene  Community Resource Referral / Chronic Care Management: CRR required this visit?  No   CCM required this visit?  No   Plan:    I have  personally reviewed and noted the following in the patient's chart:   Medical and social history Use of alcohol, tobacco or illicit drugs  Current medications and supplements including opioid prescriptions. Patient is not currently taking opioid prescriptions. Functional ability and status Nutritional status Physical activity Advanced directives List of other physicians Hospitalizations, surgeries, and ER visits in previous 12 months Vitals Screenings to include cognitive, depression, and falls Referrals and appointments  In addition, I have reviewed and discussed with patient certain preventive protocols, quality metrics, and best practice recommendations. A written personalized care plan for preventive services as well as general preventive health recommendations were provided to patient.   Jhonnie GORMAN Das, LPN   [id number]   After Visit Summary: (MyChart) Due to this being a telephonic visit, the after visit summary with patients personalized plan was offered to patient via MyChart   Notes: MAMMOGRAM ORDERED

## 2024-11-01 ENCOUNTER — Telehealth: Payer: Self-pay

## 2024-11-01 NOTE — Telephone Encounter (Signed)
 Copied from CRM [id number]. Topic: General - Other >> Nov 01, 2024 12:22 PM Sophia H wrote: Reason for CRM: **Just FYI for provider : Spoke with Carly - Patient reported not taking her duloxetine  every day due to it making her very sleepy. Stated she spoke with the provider and was looking into havingy it switched but never heard back. Patient also mentioned starting mental health therapy so Carly did put in a referral.

## 2024-11-02 ENCOUNTER — Other Ambulatory Visit: Payer: Self-pay | Admitting: Family Medicine

## 2024-11-02 MED ORDER — AUVELITY 45-105 MG PO TBCR: 1.0000 | EXTENDED_RELEASE_TABLET | ORAL | 2 refills | Status: AC

## 2024-11-12 ENCOUNTER — Ambulatory Visit: Payer: Self-pay

## 2024-11-12 NOTE — Telephone Encounter (Signed)
 FYI Only or Action Required?: FYI only for provider: appointment scheduled on 11/13/24.  Patient was last seen in primary care on 09/14/2024 by Tammie Mire, MD.  Called Nurse Triage reporting Back Pain.  Symptoms began several days ago.  Interventions attempted: Nothing.  Symptoms are: unchanged.  Triage Disposition: See Physician Within 24 Hours  Patient/caregiver understands and will follow disposition?: Yes    Copied from CRM [id number]. Topic: Clinical - Red Word Triage >> Nov 12, 2024  1:42 PM Ivette P wrote: Red Word that prompted transfer to Nurse Triage: lower back pain on left swelling. Foot is swelling when putting shoes on. not radiating pain. been had the pain. swelling in the foot for 2 days Reason for Disposition  Numbness in a leg or foot (i.e., loss of sensation)  Answer Assessment - Initial Assessment Questions Scheduled 11/13/24 alt prov  Advised call back or UC/ED if symptoms worsen. Patient verbalized understanding.  1. ONSET: When did the pain begin? (e.g., minutes, hours, days)     Foot swelling;2 days, denies redness, bluish, hot or cold to touch, pain, reports mild swelling, numbness intermittent toes; if I push my shin and goes away(sensation) Back pain; chronic 2. LOCATION: Where does it hurt? (upper, mid or lower back)     Lower back pain 3. SEVERITY: How bad is the pain?  (e.g., Scale 1-10; mild, moderate, or severe)     9/10;  4. PATTERN: Is the pain constant? (e.g., yes, no; constant, intermittent)      constant 5. RADIATION: Does the pain shoot into your legs or somewhere else?     Does not radiates 6. CAUSE:  What do you think is causing the back pain?      usnuree 7. BACK OVERUSE:  Any recent lifting of heavy objects, strenuous work or exercise?     none 8. MEDICINES: What have you taken so far for the pain? (e.g., nothing, acetaminophen , NSAIDS)     none 9. NEUROLOGIC SYMPTOMS: Do you have any weakness, numbness, or  problems with bowel/bladder control?     denies 10. OTHER SYMPTOMS: Do you have any other symptoms? (e.g., fever, abdomen pain, burning with urination, blood in urine)     Denies fver chills n/v,  Chronic abd discomfort; left side already seen MD  Protocols used: Back Pain-A-AH

## 2024-11-13 ENCOUNTER — Ambulatory Visit: Admitting: Internal Medicine

## 2024-11-13 ENCOUNTER — Other Ambulatory Visit: Payer: Self-pay

## 2024-11-13 ENCOUNTER — Encounter: Payer: Self-pay | Admitting: Internal Medicine

## 2024-11-13 VITALS — BP 128/82 | HR 100 | Temp 98.2°F | Resp 16 | Ht 62.0 in | Wt 175.6 lb

## 2024-11-13 DIAGNOSIS — M79605 Pain in left leg: Secondary | ICD-10-CM

## 2024-11-13 DIAGNOSIS — E559 Vitamin D deficiency, unspecified: Secondary | ICD-10-CM

## 2024-11-13 DIAGNOSIS — R7303 Prediabetes: Secondary | ICD-10-CM

## 2024-11-13 DIAGNOSIS — R7989 Other specified abnormal findings of blood chemistry: Secondary | ICD-10-CM

## 2024-11-13 DIAGNOSIS — M545 Low back pain, unspecified: Secondary | ICD-10-CM

## 2024-11-13 DIAGNOSIS — I10 Essential (primary) hypertension: Secondary | ICD-10-CM

## 2024-11-13 DIAGNOSIS — E785 Hyperlipidemia, unspecified: Secondary | ICD-10-CM

## 2024-11-13 NOTE — Progress Notes (Signed)
 Acute Office Visit  Subjective:     Patient ID: Tammie Lopez, female    DOB: 04/07/1971, 53 y.o.   MRN: [id number]  Chief Complaint  Patient presents with   Back Pain   Sciatica    Left side     Back Pain Associated symptoms include tingling. Pertinent negatives include no weakness.   Patient is in today for chronic back pain.   Discussed the use of AI scribe software for clinical note transcription with the patient, who gave verbal consent to proceed.  History of Present Illness Tammie Lopez is a 53 year old female who presents with chronic lower back pain and numbness in her toes.  She has had localized aching lower back pain for 2 to 3 years that does not radiate. Pain worsens when she lies on her left side. She has numbness and tingling limited to her toes and soreness in her shin with pressure, described as tendon tightening.  Meloxicam  gives partial relief. Lumbar spine MRI in January 2024 showed grade 1 anterolisthesis, disc bulging, moderate arthritis, and small posterior synovial cysts. No repeat back imaging since.  She fractured her ankle in February last year with persistent fracture line on prior imaging and has had multiple ankle MRIs. Her foot and ankle were swollen yesterday.  She has prediabetes with recent A1c 6.3. She stopped over-the-counter vitamin D  because it made her feel sick, though she tolerated prescription vitamin D  previously.  Touching her leg aggravates her back pain, which she describes as a deep ache similar to sciatica, with a sense of tightness and contraction on the left side of her body.   Review of Systems  Musculoskeletal:  Positive for back pain.  Neurological:  Positive for tingling. Negative for weakness.        Objective:    BP 128/82 (Cuff Size: Normal)   Pulse 100   Temp 98.2 F (36.8 C) (Oral)   Resp 16   Ht 5' 2 (1.575 m)   Wt 175 lb 9.6 oz (79.7 kg)   LMP 03/16/2022   SpO2 97%   BMI 32.12 kg/m  BP  Readings from Last 3 Encounters:  11/13/24 128/82  08/21/24 (!) 160/96  08/07/24 (!) 147/94   Wt Readings from Last 3 Encounters:  11/13/24 175 lb 9.6 oz (79.7 kg)  08/21/24 174 lb 9.6 oz (79.2 kg)  08/07/24 176 lb (79.8 kg)      Physical Exam Constitutional:      Appearance: Normal appearance.  HENT:     Head: Normocephalic and atraumatic.  Eyes:     Conjunctiva/sclera: Conjunctivae normal.  Cardiovascular:     Rate and Rhythm: Normal rate and regular rhythm.  Pulmonary:     Effort: Pulmonary effort is normal.     Breath sounds: Normal breath sounds.  Skin:    General: Skin is warm and dry.  Neurological:     General: No focal deficit present.     Mental Status: She is alert. Mental status is at baseline.     Motor: No weakness.  Psychiatric:        Mood and Affect: Mood normal.        Behavior: Behavior normal.     No results found for any visits on 11/13/24.      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 MRI showed  grade one anterior spondylolisthesis, disc bulging, moderate arthritis, and synovial cysts. Possible compression symptoms. Differential includes sciatica and nerve compression. - Referred to orthopedic specialist for evaluation and potential nerve conduction study. - Consider repeating lumbar MRI if symptoms worsen or persist. - Continue meloxicam  for symptomatic relief.  History of left ankle fracture with residual swelling Residual swelling in left ankle post-fracture with visible fracture line, indicating potential chronic weakness. - Continue follow-up with orthopedic specialist for ankle evaluation.  Prediabetes Previous HbA1c of 6.3%. Discussed diabetic neuropathy and insurance coverage for weight loss medications. - Monitor HbA1c levels.  Hypertension -Blood pressure controlled,  needing annual labs today.   History of Abnormal TSH -Repeat with labs due to tingling/numbness along with B Vitamins, Vitamin D  and A1c.   General health maintenance Discussed routine labs to rule out other neuropathy causes. Previous difficulty with blood draws due to dehydration noted. - Ordered labs: kidney function, liver function, electrolytes, thyroid  function, cholesterol, vitamin B12, vitamin B1, vitamin D . - Attempted blood draw today; consider ultrasound-guided draw if unsuccessful.  - CBC w/Diff/Platelet - Comprehensive Metabolic Panel (CMET) - TSH - Vitamin B12 - Vitamin B1 - Vitamin D  (25 hydroxy) - Lipid Profile - HgB A1c   Return if symptoms worsen or fail to improve.  Sharyle Fischer, DO

## 2024-11-14 ENCOUNTER — Ambulatory Visit: Payer: Self-pay | Admitting: Internal Medicine

## 2024-11-16 LAB — COMPREHENSIVE METABOLIC PANEL WITH GFR
AG Ratio: 1.7 (calc) (ref 1.0–2.5)
ALT: 23 U/L (ref 6–29)
AST: 17 U/L (ref 10–35)
Albumin: 4.8 g/dL (ref 3.6–5.1)
Alkaline phosphatase (APISO): 54 U/L (ref 37–153)
BUN: 15 mg/dL (ref 7–25)
CO2: 33 mmol/L — ABNORMAL HIGH (ref 20–32)
Calcium: 10.1 mg/dL (ref 8.6–10.4)
Chloride: 102 mmol/L (ref 98–110)
Creat: 0.73 mg/dL (ref 0.50–1.03)
Globulin: 2.9 g/dL (ref 1.9–3.7)
Glucose, Bld: 113 mg/dL — ABNORMAL HIGH (ref 65–99)
Potassium: 4.4 mmol/L (ref 3.5–5.3)
Sodium: 144 mmol/L (ref 135–146)
Total Bilirubin: 0.4 mg/dL (ref 0.2–1.2)
Total Protein: 7.7 g/dL (ref 6.1–8.1)
eGFR: 99 mL/min/1.73m2 (ref >=60)

## 2024-11-16 LAB — HEMOGLOBIN A1C
Hgb A1c MFr Bld: 5.7 % — ABNORMAL HIGH (ref <5.7)
Mean Plasma Glucose: 117 mg/dL
eAG (mmol/L): 6.5 mmol/L

## 2024-11-16 LAB — CBC WITH DIFFERENTIAL/PLATELET
Absolute Lymphocytes: 1131 {cells}/uL (ref 850–3900)
Absolute Monocytes: 455 {cells}/uL (ref 200–950)
Basophils Absolute: 21 {cells}/uL (ref 0–200)
Basophils Relative: 0.6 %
Eosinophils Absolute: 32 {cells}/uL (ref 15–500)
Eosinophils Relative: 0.9 %
HCT: 42.2 % (ref 35.9–46.0)
Hemoglobin: 13.7 g/dL (ref 11.7–15.5)
MCH: 27.1 pg (ref 27.0–33.0)
MCHC: 32.5 g/dL (ref 31.6–35.4)
MCV: 83.6 fL (ref 81.4–101.7)
MPV: 11.6 fL (ref 7.5–12.5)
Monocytes Relative: 13 %
Neutro Abs: 1862 {cells}/uL (ref 1500–7800)
Neutrophils Relative %: 53.2 %
Platelets: 263 Thousand/uL (ref 140–400)
RBC: 5.05 Million/uL (ref 3.80–5.10)
RDW: 12.7 % (ref 11.0–15.0)
Total Lymphocyte: 32.3 %
WBC: 3.5 Thousand/uL — ABNORMAL LOW (ref 3.8–10.8)

## 2024-11-16 LAB — LIPID PANEL
Cholesterol: 186 mg/dL (ref <200)
HDL: 62 mg/dL (ref >=50)
LDL Cholesterol (Calc): 103 mg/dL — ABNORMAL HIGH
Non-HDL Cholesterol (Calc): 124 mg/dL (ref <130)
Total CHOL/HDL Ratio: 3 (calc) (ref <5.0)
Triglycerides: 112 mg/dL (ref <150)

## 2024-11-16 LAB — VITAMIN B1: Vitamin B1 (Thiamine): 24 nmol/L (ref 8–30)

## 2024-11-16 LAB — VITAMIN B12: Vitamin B-12: 681 pg/mL (ref 200–1100)

## 2024-11-16 LAB — TSH: TSH: 0.78 m[IU]/L

## 2024-11-16 LAB — VITAMIN D 25 HYDROXY (VIT D DEFICIENCY, FRACTURES): Vit D, 25-Hydroxy: 37 ng/mL (ref 30–100)

## 2024-11-24 ENCOUNTER — Encounter: Payer: Self-pay | Admitting: Internal Medicine

## 2024-11-24 DIAGNOSIS — R200 Anesthesia of skin: Secondary | ICD-10-CM

## 2024-11-24 DIAGNOSIS — M549 Dorsalgia, unspecified: Secondary | ICD-10-CM

## 2024-11-26 ENCOUNTER — Other Ambulatory Visit: Payer: Self-pay | Admitting: Family Medicine

## 2024-11-26 DIAGNOSIS — M48062 Spinal stenosis, lumbar region with neurogenic claudication: Secondary | ICD-10-CM

## 2024-12-13 ENCOUNTER — Ambulatory Visit: Admitting: Family Medicine

## 2024-12-17 ENCOUNTER — Institutional Professional Consult (permissible substitution) (INDEPENDENT_AMBULATORY_CARE_PROVIDER_SITE_OTHER): Admitting: Physician Assistant

## 2024-12-18 ENCOUNTER — Ambulatory Visit: Payer: Self-pay | Admitting: Plastic Surgery

## 2025-01-03 ENCOUNTER — Ambulatory Visit: Payer: Self-pay

## 2025-01-03 NOTE — Telephone Encounter (Signed)
 FYI Only or Action Required?: Action required by provider: request for appointment, clinical question for provider, and update on patient condition.  Patient was last seen in primary care on 11/13/2024 by Bernardo Fend, DO.  Called Nurse Triage reporting Hypotension and Dizziness.  Symptoms began several days ago.  Interventions attempted: Prescription medications: amlodipine .  Symptoms are: gradually worsening.  Triage Disposition: Go to ED Now (or PCP Triage)  Patient/caregiver understands and will follow disposition?: No, refuses disposition      Reason for Disposition  [1] Fall in systolic BP > 20 mm Hg from normal AND [2] feeling weak or lightheaded  Answer Assessment - Initial Assessment Questions This RN recommended pt be examined in hospital, pt refusing, requesting appt. Advised pt get to hospital asap if any new or worsening symptoms. Sending message to PCP office for call back to pt with further recommendations. Alerted [facility] to ED refusal.     1. BLOOD PRESSURE: What is your blood pressure? Did you take at least two measurements 5 minutes apart?     106/72 1 pm today  4. HISTORY: Do you have a history of low blood pressure? What is your blood pressure normally?     Usually have 140 systolic  5. MEDICINES: Are you taking any medicines for blood pressure? If Yes, ask: Have they been changed recently?     Amlodipine   7. OTHER SYMPTOMS: Have you been sick recently? Have you had a recent injury?     Dizziness Not been feeling well lately, yesterday was worse Fatigue Nausea last night Maybe nasal congestion Likely fever last night Shaky chills last night No fever today A little diarrhea  Denies: Cold/clammy skin Too weak to stand Heart racing Confusion/slurred speech Passing out Chest pain Vomiting Bleeding SOB Abdominal pain Fever >100.4 F to her knowledge Fever today  Protocols used: Blood Pressure - Low-A-AH

## 2025-01-03 NOTE — Telephone Encounter (Signed)
 Scheduled for tomorrow.

## 2025-01-04 ENCOUNTER — Encounter: Payer: Self-pay | Admitting: Family Medicine

## 2025-01-04 ENCOUNTER — Ambulatory Visit: Admitting: Family Medicine

## 2025-01-04 VITALS — BP 144/92 | HR 88 | Resp 16 | Ht 62.0 in | Wt 176.1 lb

## 2025-01-04 DIAGNOSIS — B349 Viral infection, unspecified: Secondary | ICD-10-CM

## 2025-01-04 DIAGNOSIS — G35D Multiple sclerosis, unspecified: Secondary | ICD-10-CM

## 2025-01-04 DIAGNOSIS — Z113 Encounter for screening for infections with a predominantly sexual mode of transmission: Secondary | ICD-10-CM

## 2025-01-04 DIAGNOSIS — R42 Dizziness and giddiness: Secondary | ICD-10-CM

## 2025-01-04 MED ORDER — AZITHROMYCIN 500 MG PO TABS: 500.0000 mg | ORAL_TABLET | Freq: Every day | ORAL | 0 refills | Status: AC

## 2025-01-04 NOTE — Progress Notes (Signed)
 Name: Tammie Lopez   MRN: [id number]    DOB: 1971/01/08   Date:01/04/2025       Progress Note  Subjective  Chief Complaint  Chief Complaint  Patient presents with   Dizziness    Continues having it several days, while being active or not Has not taken Amlodipine    Discussed the use of AI scribe software for clinical note transcription with the patient, who gave verbal consent to proceed.  History of Present Illness Tammie Lopez is a 54 year old female with multiple sclerosis who presents with dizziness.  She has been experiencing dizziness for about a week, described as feeling 'off balance' and 'wobbly' rather than spinning. The dizziness occurs primarily when walking and is not currently associated with nausea, although she did experience nausea and diarrhea earlier in the week. A COVID test was negative. She suspects the dizziness might be related to sinus drainage, as she has been experiencing left frontal pressure and drainage, which she feels is worsening.  She has a history of multiple sclerosis but has not experienced dizziness related to MS in the past. She did not take her blood pressure medication, amlodipine , for the past two days due to low blood pressure.  She has a history of lumbar back pain with radiation down her leg and is scheduled for a nerve conduction study next week. Previous lab work showed a slightly low white blood cell count, which is a chronic issue, but other labs, including vitamin D , sugar, kidney, liver function, cholesterol, and thyroid , were normal.  She mentions a past diagnosis of trichomoniasis and wants to be tested again to ensure it has not recurred. She was treated for it months ago and inquires about testing for other STIs, although she declines testing for HIV and syphilis at this time.    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 07/16/2020   Low TSH level 09/19/2019   Obesity (BMI 30-39.9) 12/06/2017   Insulin  resistance 09/27/2017   Essential hypertension 09/21/2017   Right sided sciatica 09/21/2017   Major depression, recurrent 05/24/2017   Migraine without aura, not intractable 12/09/2016   Nickel allergy 12/09/2016   GERD without esophagitis 12/09/2016   Other neutropenia 12/09/2016   Dyslipidemia 12/09/2016   Diverticulosis 12/09/2016   Allergic rhinitis, seasonal 12/09/2016   Chronic constipation 12/09/2016   Vitamin D  deficiency 08/06/2013   MS (multiple sclerosis) 04/17/2013    Past Surgical History:  Procedure Laterality Date   BREAST BIOPSY Left 01/06/2018    2 areas  neg   BUNIONECTOMY     CESAREAN SECTION N/A 2006   COLONOSCOPY WITH PROPOFOL  N/A 02/09/2021   Procedure: COLONOSCOPY WITH PROPOFOL ;  Surgeon: Jinny Carmine, MD;  Location: [facility];  Service: Endoscopy;  Laterality: N/A;   GANGLION CYST EXCISION Bilateral    Hands   POLYPECTOMY N/A 02/09/2021   Procedure: POLYPECTOMY;  Surgeon: Jinny Carmine, MD;  Location: [facility];  Service: Endoscopy;  Laterality: N/A;    Family History  Problem Relation Age of Onset   Diabetes Mother    Hypertension Mother    Cancer Father 15  Mouth   COPD Father    Hypertension Brother    Diabetes Brother    Breast cancer Neg Hx     Social History   Tobacco Use   Smoking status: Never   Smokeless tobacco: Never   Tobacco comments:    smoking cessation materials not required  Substance Use Topics   Alcohol use: No    Current Medications[1]  Allergies[2]  I personally reviewed active problem list, medication list, allergies, family history with the patient/caregiver  today.   ROS  Ten systems reviewed and is negative except as mentioned in HPI    Objective Physical Exam  CONSTITUTIONAL: Patient appears well-developed and well-nourished. No distress. HEENT: Head atraumatic, normocephalic, neck supple. Maxillary and frontal sinus tenderness on the left side. Throat normal. CARDIOVASCULAR: Normal rate, regular rhythm and normal heart sounds. No murmur heard. No BLE edema. PULMONARY: Effort normal and breath sounds normal. No respiratory distress. MUSCULOSKELETAL: Normal gait. Without gross motor or sensory deficit. PSYCHIATRIC: Patient has a normal mood and affect. Behavior is normal. Judgment and thought content normal. NEUROLOGICAL: Sensation altered on the left side - chronic. No nystagmus, romberg negative, normal grip  Vitals:   01/04/25 1036 01/04/25 1104  BP: (!) 158/94 (!) 144/92  Pulse: 88   Resp: 16   SpO2: 98%   Weight: 176 lb 1.6 oz (79.9 kg)   Height: 5' 2 (1.575 m)     Body mass index is 32.21 kg/m.  Recent Results (from the past 2160 hours)  CBC w/Diff/Platelet     Status: Abnormal   Collection Time: 11/13/24 11:27 AM  Result Value Ref Range   WBC 3.5 (L) 3.8 - 10.8 Thousand/uL   RBC 5.05 3.80 - 5.10 Million/uL   Hemoglobin 13.7 11.7 - 15.5 g/dL   HCT 57.7 64.0 - 53.9 %   MCV 83.6 81.4 - 101.7 fL   MCH 27.1 27.0 - 33.0 pg   MCHC 32.5 31.6 - 35.4 g/dL   RDW 87.2 88.9 - 84.9 %   Platelets 263 140 - 400 Thousand/uL   MPV 11.6 7.5 - 12.5 fL   Neutro Abs 1,862 1,500 - 7,800 cells/uL   Absolute Lymphocytes 1,131 850 - 3,900 cells/uL   Absolute Monocytes 455 200 - 950 cells/uL   Eosinophils Absolute 32 15 - 500 cells/uL   Basophils Absolute 21 0 - 200 cells/uL   Neutrophils Relative % 53.2 %   Total Lymphocyte 32.3 %   Monocytes Relative 13.0 %   Eosinophils Relative 0.9 %   Basophils Relative 0.6 %  Comprehensive Metabolic Panel (CMET)     Status: Abnormal   Collection Time: 11/13/24 11:27 AM  Result Value Ref Range    Glucose, Bld 113 (H) 65 - 99 mg/dL    Comment: .            Fasting reference interval . For someone without known diabetes, a glucose value between 100 and 125 mg/dL is consistent with prediabetes and should be confirmed with a follow-up test. .    BUN 15 7 - 25 mg/dL   Creat 9.26 9.49 - 8.96 mg/dL   eGFR 99 > OR = 60 fO/fpw/8.26f7   BUN/Creatinine Ratio SEE NOTE: 6 - 22 (calc)    Comment:    Not Reported: BUN and Creatinine are within    reference range. .    Sodium 144 135 - 146 mmol/L   Potassium 4.4 3.5 - 5.3 mmol/L   Chloride 102 98 - 110 mmol/L   CO2  33 (H) 20 - 32 mmol/L   Calcium 10.1 8.6 - 10.4 mg/dL   Total Protein 7.7 6.1 - 8.1 g/dL   Albumin 4.8 3.6 - 5.1 g/dL   Globulin 2.9 1.9 - 3.7 g/dL (calc)   AG Ratio 1.7 1.0 - 2.5 (calc)   Total Bilirubin 0.4 0.2 - 1.2 mg/dL   Alkaline phosphatase (APISO) 54 37 - 153 U/L   AST 17 10 - 35 U/L   ALT 23 6 - 29 U/L  Lipid Profile     Status: Abnormal   Collection Time: 11/13/24 11:27 AM  Result Value Ref Range   Cholesterol 186 <200 mg/dL   HDL 62 > OR = 50 mg/dL   Triglycerides 887 <849 mg/dL   LDL Cholesterol (Calc) 103 (H) mg/dL (calc)    Comment: Reference range: <100 . Desirable range <100 mg/dL for primary prevention;   <70 mg/dL for patients with CHD or diabetic patients  with > or = 2 CHD risk factors. SABRA LDL-C is now calculated using the Martin-Hopkins  calculation, which is a validated novel method providing  better accuracy than the Friedewald equation in the  estimation of LDL-C.  Gladis APPLETHWAITE et al. SANDREA. 7986;689(80): 2061-2068  (http://education.[facility].com/faq/FAQ164)    Total CHOL/HDL Ratio 3.0 <5.0 (calc)   Non-HDL Cholesterol (Calc) 124 <130 mg/dL (calc)    Comment: For patients with diabetes plus 1 major ASCVD risk  factor, treating to a non-HDL-C goal of <100 mg/dL  (LDL-C of <29 mg/dL) is considered a therapeutic  option.   TSH     Status: None   Collection Time: 11/13/24 11:27  AM  Result Value Ref Range   TSH 0.78 mIU/L    Comment:           Reference Range .           > or = 20 Years  0.40-4.50 .                Pregnancy Ranges           First trimester    0.26-2.66           Second trimester   0.55-2.73           Third trimester    0.43-2.91   HgB A1c     Status: Abnormal   Collection Time: 11/13/24 11:27 AM  Result Value Ref Range   Hgb A1c MFr Bld 5.7 (H) <5.7 %    Comment: For someone without known diabetes, a hemoglobin  A1c value between 5.7% and 6.4% is consistent with prediabetes and should be confirmed with a  follow-up test. . For someone with known diabetes, a value <7% indicates that their diabetes is well controlled. A1c targets should be individualized based on duration of diabetes, age, comorbid conditions, and other considerations. . This assay result is consistent with an increased risk of diabetes. . Currently, no consensus exists regarding use of hemoglobin A1c for diagnosis of diabetes for children. .    Mean Plasma Glucose 117 mg/dL   eAG (mmol/L) 6.5 mmol/L  Vitamin B12     Status: None   Collection Time: 11/13/24 11:27 AM  Result Value Ref Range   Vitamin B-12 681 200 - 1,100 pg/mL  Vitamin B1     Status: None   Collection Time: 11/13/24 11:27 AM  Result Value Ref Range   Vitamin B1 (Thiamine) 24 8 - 30 nmol/L    Comment: (Note) Vitamin supplementation within 24 hours prior to blood  draw may affect the accuracy of the results. . This test was developed and its analytical performance  characteristics have been determined by [employer]. It has not been cleared or approved by FDA.  This assay has been validated pursuant to the CLIA  regulations and is used for clinical purposes. . [facility] [address] [place] [address] [phone] Johanna Agent L. Frame, MD, PhD   Vitamin D  (25 hydroxy)     Status: None   Collection Time: 11/13/24 11:27 AM  Result Value Ref  Range   Vit D, 25-Hydroxy 37 30 - 100 ng/mL    Comment: Vitamin D  Status         25-OH Vitamin D : . Deficiency:                    <20 ng/mL Insufficiency:             20 - 29 ng/mL Optimal:                 > or = 30 ng/mL . For 25-OH Vitamin D  testing on patients on  D2-supplementation and patients for whom quantitation  of D2 and D3 fractions is required, the [facility](TM) 25-OH VIT D, (D2,D3), LC/MS/MS is recommended: order  code 07111 (patients >53yrs). . See Note 1 . Note 1 . For additional information, please refer to  http://education.[facility].com/faq/FAQ199  (This link is being provided for informational/ educational purposes only.)     Diabetic Foot Exam:     PHQ2/9:    01/04/2025   10:35 AM 09/20/2024    9:37 AM 09/14/2024    9:17 AM 06/12/2024    2:09 PM 10/04/2023   10:49 AM  Depression screen PHQ 2/9  Decreased Interest 0 0 1 3 2   Down, Depressed, Hopeless 0 0 1 3 1   PHQ - 2 Score 0 0 2 6 3   Altered sleeping 0 0 1 3 2   Tired, decreased energy 0 0 1 3 1   Change in appetite 0 0 1 3 2   Feeling bad or failure about yourself  0 0 0 0 1  Trouble concentrating 0 0 1 3 0  Moving slowly or fidgety/restless 0 0 0 0 0  Suicidal thoughts 0 0 0 0 0  PHQ-9 Score 0 0  6  18  9    Difficult doing work/chores Not difficult at all Not difficult at all Somewhat difficult Very difficult Somewhat difficult     Data saved with a previous flowsheet row definition    phq 9 is negative  Fall Risk:    01/04/2025   10:35 AM 09/20/2024    9:39 AM 09/14/2024    9:17 AM 06/12/2024    2:06 PM 10/04/2023   10:49 AM  Fall Risk   Falls in the past year? 0 0 0 0 1  Number falls in past yr: 0 0 0 0 0  Injury with Fall? 0 0  0  0  1   Risk for fall due to : No Fall Risks  No Fall Risks No Fall Risks History of fall(s);Orthopedic patient  Follow up Falls evaluation completed Falls evaluation completed;Falls prevention discussed Falls evaluation completed Falls evaluation  completed Education provided;Falls prevention discussed;Falls evaluation completed     Data saved with a previous flowsheet row definition      Assessment & Plan Acute viral upper respiratory infection with acute sinusitis Symptoms suggest viral etiology with possible sinusitis. Dizziness improving, drainage worsening.  Differential includes sinusitis, MS, and migraine. - Prescribed azithromycin  500 mg once daily for 3 days. - Advised hydration and resumption of amlodipine . - Consider further evaluation for MS or migraine if symptoms persist.  Multiple sclerosis No new symptoms, she does not think dizziness is from MS. Symptoms may relate to sinusitis or viral illness. - Scheduled MRI previously ordered   Essential hypertension Blood pressure elevated. Resumed amlodipine  today. - Advised resumption of amlodipine  at noon.  Screening for sexually transmitted infections Request for STI screening due to history of trichomoniasis. Declines HIV and syphilis testing. - Provided vaginal swab for STI screening, including trichomoniasis. - Advised partner treatment if trichomoniasis confirmed.        [1]  Current Outpatient Medications:    amLODipine  (NORVASC ) 2.5 MG tablet, Take 1 tablet (2.5 mg total) by mouth daily., Disp: 90 tablet, Rfl: 1   Cholecalciferol (VITAMIN D ) 50 MCG (2000 UT) CAPS, Take 2,000 Units by mouth daily., Disp: , Rfl:    Dextromethorphan-buPROPion ER (AUVELITY ) 45-105 MG TBCR, Take 1 tablet by mouth every morning., Disp: 30 tablet, Rfl: 2   Dimethyl Fumarate  (TECFIDERA ) 240 MG CPDR, Take 1 capsule by mouth in the morning and at bedtime., Disp: , Rfl:    estrogen, conjugated,-medroxyprogesterone (PREMPRO ) 0.3-1.5 MG tablet, Take 1 tablet by mouth daily., Disp: 90 tablet, Rfl: 0   omeprazole  (PRILOSEC) 40 MG capsule, TAKE 1 CAPSULE BY MOUTH EVERY DAY (Patient taking differently: TAKES PRN), Disp: 90 capsule, Rfl: 1   traZODone  (DESYREL ) 50 MG tablet, TAKE 1/2 TO 1  TABLET BY MOUTH AT BEDTIME AS NEEDED FOR SLEEP, Disp: 90 tablet, Rfl: 0   Ubrogepant  (UBRELVY ) 100 MG TABS, Take 1 tablet (100 mg total) by mouth daily as needed. For migraine headaches, Disp: 16 tablet, Rfl: 1 [2]  Allergies Allergen Reactions   Penicillins Hives

## 2025-01-15 ENCOUNTER — Encounter

## 2025-09-26 ENCOUNTER — Ambulatory Visit
